# Patient Record
Sex: Female | Born: 1950
Health system: Southern US, Community
[De-identification: ages and names within clinical notes are randomized; demographics above are authoritative.]

## PROBLEM LIST (undated history)

## (undated) DIAGNOSIS — M8580 Other specified disorders of bone density and structure, unspecified site: Secondary | ICD-10-CM

## (undated) DIAGNOSIS — I10 Essential (primary) hypertension: Secondary | ICD-10-CM

## (undated) DIAGNOSIS — F32A Depression, unspecified: Secondary | ICD-10-CM

## (undated) DIAGNOSIS — I493 Ventricular premature depolarization: Secondary | ICD-10-CM

## (undated) DIAGNOSIS — F419 Anxiety disorder, unspecified: Secondary | ICD-10-CM

## (undated) DIAGNOSIS — R51 Headache: Secondary | ICD-10-CM

## (undated) DIAGNOSIS — M1990 Unspecified osteoarthritis, unspecified site: Secondary | ICD-10-CM

## (undated) DIAGNOSIS — E876 Hypokalemia: Secondary | ICD-10-CM

## (undated) DIAGNOSIS — I5022 Chronic systolic (congestive) heart failure: Secondary | ICD-10-CM

## (undated) DIAGNOSIS — C50919 Malignant neoplasm of unspecified site of unspecified female breast: Secondary | ICD-10-CM

## (undated) DIAGNOSIS — N183 Chronic kidney disease, stage 3 unspecified: Secondary | ICD-10-CM

## (undated) DIAGNOSIS — R9389 Abnormal findings on diagnostic imaging of other specified body structures: Secondary | ICD-10-CM

## (undated) DIAGNOSIS — B029 Zoster without complications: Secondary | ICD-10-CM

## (undated) DIAGNOSIS — I471 Supraventricular tachycardia: Secondary | ICD-10-CM

## (undated) DIAGNOSIS — I2510 Atherosclerotic heart disease of native coronary artery without angina pectoris: Secondary | ICD-10-CM

## (undated) DIAGNOSIS — E785 Hyperlipidemia, unspecified: Secondary | ICD-10-CM

## (undated) DIAGNOSIS — Z9119 Patient's noncompliance with other medical treatment and regimen: Secondary | ICD-10-CM

## (undated) DIAGNOSIS — C4490 Unspecified malignant neoplasm of skin, unspecified: Secondary | ICD-10-CM

## (undated) DIAGNOSIS — F329 Major depressive disorder, single episode, unspecified: Secondary | ICD-10-CM

## (undated) DIAGNOSIS — I4719 Other supraventricular tachycardia: Secondary | ICD-10-CM

## (undated) HISTORY — PX: CHOLECYSTECTOMY: SHX55

## (undated) HISTORY — DX: Patient's noncompliance with other medical treatment and regimen: Z91.19

## (undated) HISTORY — DX: Abnormal findings on diagnostic imaging of other specified body structures: R93.89

## (undated) HISTORY — DX: Chronic systolic (congestive) heart failure: I50.22

## (undated) HISTORY — DX: Chronic kidney disease, stage 3 (moderate): N18.3

## (undated) HISTORY — PX: ABDOMINAL HYSTERECTOMY: SHX81

## (undated) HISTORY — DX: Hyperlipidemia, unspecified: E78.5

## (undated) HISTORY — PX: PARTIAL MASTECTOMY WITH AXILLARY SENTINEL LYMPH NODE BIOPSY: SHX6004

## (undated) HISTORY — PX: BREAST BIOPSY: SHX20

## (undated) HISTORY — DX: Malignant neoplasm of unspecified site of unspecified female breast: C50.919

## (undated) HISTORY — DX: Other specified disorders of bone density and structure, unspecified site: M85.80

## (undated) HISTORY — PX: SENTINEL LYMPH NODE BIOPSY: SHX2392

## (undated) HISTORY — DX: Ventricular premature depolarization: I49.3

## (undated) HISTORY — DX: Chronic kidney disease, stage 3 unspecified: N18.30

## (undated) HISTORY — DX: Other supraventricular tachycardia: I47.19

## (undated) HISTORY — DX: Hypokalemia: E87.6

## (undated) HISTORY — DX: Supraventricular tachycardia: I47.1

## (undated) HISTORY — DX: Unspecified malignant neoplasm of skin, unspecified: C44.90

## (undated) SURGERY — EGD (ESOPHAGOGASTRODUODENOSCOPY)
Anesthesia: Moderate Sedation

---

## 2000-10-23 ENCOUNTER — Encounter: Admission: RE | Admit: 2000-10-23 | Discharge: 2000-10-23 | Payer: Self-pay

## 2001-07-22 ENCOUNTER — Observation Stay (HOSPITAL_COMMUNITY): Admission: EM | Admit: 2001-07-22 | Discharge: 2001-07-23 | Payer: Self-pay

## 2002-10-31 ENCOUNTER — Encounter: Payer: Self-pay | Admitting: Family Medicine

## 2002-10-31 ENCOUNTER — Ambulatory Visit (HOSPITAL_COMMUNITY): Admission: RE | Admit: 2002-10-31 | Discharge: 2002-10-31 | Payer: Self-pay | Admitting: Family Medicine

## 2003-12-22 ENCOUNTER — Ambulatory Visit (HOSPITAL_COMMUNITY): Admission: RE | Admit: 2003-12-22 | Discharge: 2003-12-22 | Payer: Self-pay | Admitting: Family Medicine

## 2004-03-22 ENCOUNTER — Ambulatory Visit (HOSPITAL_COMMUNITY): Admission: RE | Admit: 2004-03-22 | Discharge: 2004-03-22 | Payer: Self-pay | Admitting: Family Medicine

## 2004-06-03 ENCOUNTER — Ambulatory Visit (HOSPITAL_COMMUNITY): Admission: RE | Admit: 2004-06-03 | Discharge: 2004-06-03 | Payer: Self-pay | Admitting: Family Medicine

## 2005-02-19 ENCOUNTER — Ambulatory Visit (HOSPITAL_COMMUNITY): Admission: RE | Admit: 2005-02-19 | Discharge: 2005-02-19 | Payer: Self-pay | Admitting: Family Medicine

## 2005-03-12 ENCOUNTER — Encounter: Admission: RE | Admit: 2005-03-12 | Discharge: 2005-03-24 | Payer: Self-pay | Admitting: Neurosurgery

## 2006-09-18 ENCOUNTER — Ambulatory Visit (HOSPITAL_COMMUNITY): Admission: RE | Admit: 2006-09-18 | Discharge: 2006-09-18 | Payer: Self-pay | Admitting: Family Medicine

## 2007-06-03 ENCOUNTER — Emergency Department (HOSPITAL_COMMUNITY): Admission: EM | Admit: 2007-06-03 | Discharge: 2007-06-03 | Payer: Self-pay | Admitting: Emergency Medicine

## 2007-07-27 ENCOUNTER — Ambulatory Visit (HOSPITAL_COMMUNITY): Admission: RE | Admit: 2007-07-27 | Discharge: 2007-07-27 | Payer: Self-pay | Admitting: Family Medicine

## 2007-09-17 ENCOUNTER — Ambulatory Visit (HOSPITAL_COMMUNITY): Admission: RE | Admit: 2007-09-17 | Discharge: 2007-09-17 | Payer: Self-pay | Admitting: Family Medicine

## 2007-12-17 ENCOUNTER — Ambulatory Visit (HOSPITAL_COMMUNITY): Admission: RE | Admit: 2007-12-17 | Discharge: 2007-12-17 | Payer: Self-pay | Admitting: Family Medicine

## 2008-01-05 ENCOUNTER — Emergency Department (HOSPITAL_COMMUNITY): Admission: EM | Admit: 2008-01-05 | Discharge: 2008-01-05 | Payer: Self-pay | Admitting: Emergency Medicine

## 2008-07-27 ENCOUNTER — Emergency Department (HOSPITAL_COMMUNITY): Admission: EM | Admit: 2008-07-27 | Discharge: 2008-07-27 | Payer: Self-pay | Admitting: Family Medicine

## 2009-01-16 ENCOUNTER — Emergency Department (HOSPITAL_COMMUNITY): Admission: EM | Admit: 2009-01-16 | Discharge: 2009-01-16 | Payer: Self-pay | Admitting: Emergency Medicine

## 2009-05-15 ENCOUNTER — Ambulatory Visit (HOSPITAL_COMMUNITY): Admission: RE | Admit: 2009-05-15 | Discharge: 2009-05-15 | Payer: Self-pay | Admitting: Family Medicine

## 2009-08-13 ENCOUNTER — Ambulatory Visit (HOSPITAL_COMMUNITY): Admission: RE | Admit: 2009-08-13 | Discharge: 2009-08-13 | Payer: Self-pay | Admitting: Family Medicine

## 2010-01-21 ENCOUNTER — Ambulatory Visit (HOSPITAL_COMMUNITY): Admission: RE | Admit: 2010-01-21 | Discharge: 2010-01-21 | Payer: Self-pay | Admitting: Family Medicine

## 2010-02-27 ENCOUNTER — Emergency Department (HOSPITAL_COMMUNITY): Admission: EM | Admit: 2010-02-27 | Discharge: 2010-02-27 | Payer: Self-pay | Admitting: Emergency Medicine

## 2010-02-27 ENCOUNTER — Encounter: Payer: Self-pay | Admitting: Internal Medicine

## 2010-02-28 ENCOUNTER — Encounter: Payer: Self-pay | Admitting: Internal Medicine

## 2010-06-12 ENCOUNTER — Ambulatory Visit: Payer: Self-pay | Admitting: Cardiology

## 2010-06-12 ENCOUNTER — Emergency Department (HOSPITAL_COMMUNITY): Admission: EM | Admit: 2010-06-12 | Discharge: 2010-06-12 | Payer: Self-pay | Admitting: Family Medicine

## 2010-06-12 ENCOUNTER — Emergency Department (HOSPITAL_COMMUNITY): Admission: EM | Admit: 2010-06-12 | Discharge: 2010-06-13 | Payer: Self-pay | Admitting: Emergency Medicine

## 2010-06-14 ENCOUNTER — Telehealth: Payer: Self-pay | Admitting: Internal Medicine

## 2010-07-15 ENCOUNTER — Ambulatory Visit: Payer: Self-pay | Admitting: Internal Medicine

## 2010-07-15 ENCOUNTER — Telehealth (INDEPENDENT_AMBULATORY_CARE_PROVIDER_SITE_OTHER): Payer: Self-pay | Admitting: *Deleted

## 2010-07-15 DIAGNOSIS — Z8679 Personal history of other diseases of the circulatory system: Secondary | ICD-10-CM

## 2010-07-15 DIAGNOSIS — F341 Dysthymic disorder: Secondary | ICD-10-CM | POA: Insufficient documentation

## 2010-07-18 ENCOUNTER — Telehealth: Payer: Self-pay | Admitting: Internal Medicine

## 2010-07-19 ENCOUNTER — Telehealth: Payer: Self-pay | Admitting: Internal Medicine

## 2010-07-19 ENCOUNTER — Telehealth (INDEPENDENT_AMBULATORY_CARE_PROVIDER_SITE_OTHER): Payer: Self-pay | Admitting: *Deleted

## 2010-07-19 ENCOUNTER — Ambulatory Visit: Payer: Self-pay | Admitting: Internal Medicine

## 2010-07-19 ENCOUNTER — Observation Stay (HOSPITAL_COMMUNITY): Admission: EM | Admit: 2010-07-19 | Discharge: 2010-07-20 | Payer: Self-pay | Admitting: Emergency Medicine

## 2010-08-23 ENCOUNTER — Ambulatory Visit: Payer: Self-pay | Admitting: Internal Medicine

## 2010-08-23 DIAGNOSIS — R12 Heartburn: Secondary | ICD-10-CM

## 2010-12-16 ENCOUNTER — Encounter: Payer: Self-pay | Admitting: Family Medicine

## 2010-12-24 NOTE — Progress Notes (Signed)
  Records recieved form [facility] gave to [facility] for 08/23/10 appt w/ Francee Nodal Mesiemore  July 15, 2010 4:01 PM

## 2010-12-24 NOTE — Assessment & Plan Note (Signed)
Summary: 4wk f/u no avial slots in 4wk ok to wait 5weeks per chris y.   CC:  sob...and pt complains of stomach pains and gerd also pt has been under alot of stress due to husbands illness.  History of Present Illness: Kristen Garner is seen in followup for adenosine sensitive recurrent atrial tachycardia. Initial therapy attempts were undertaken with flecainide which was complicated by hypotension and bradycardia.  She was recently admitted for this; these records were reviewed. She was discharged on verapamil whichi has been assoc with gi distress.         She has had na echo in the remote past whtihc was normal and a myoview in [place] in April 2011 which was also normal    she has depression related to multiple circumstances including family work and her husband has recently had an mi and will be meeting with Dr Leonia Reeves  next week.   Current Medications (verified): 1)  Metoprolol Tartrate 50 Mg Tabs (Metoprolol Tartrate) .... Take One Tablet Two Times A Day 2)  Glimepiride 4 Mg Tabs (Glimepiride) .... Take One Tablet Once Daily 3)  Losartan Potassium 50 Mg Tabs (Losartan Potassium) .Marland Kitchen.. 1  Tab By Mouth Once Daily 4)  Zolpidem Tartrate 10 Mg Tabs (Zolpidem Tartrate) .... Take One Tablet Once At Bedtime 5)  Alprazolam 0.5 Mg Tabs (Alprazolam) .... Take One Tablet or 1/2 Tablet Every 6 Hrs 6)  Aspirin 81 Mg Tabs (Aspirin) .... Take One Tablet Once Daily 7)  Verapamil Hcl 120 Mg Tabs (Verapamil Hcl) .Marland Kitchen.. 1  Tab By Mouth Once Daily  Allergies: 1)  ! Pcn 2)  ! Sulfa 3)  ! Codeine 4)  ! 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 -1993 Alcohol Use - no  Vital Signs:  Patient profile:   60 year old Garner Height:      64 inches Weight:      203 pounds BMI:     34.97 Pulse rate:   81 / minute Resp:     14 per minute BP sitting:   159 / 81  (left arm)  Vitals Entered By: Burnett Kanaris (August 23, 2010 10:26 AM)  Physical Exam  General:  The patient was alert and oriented in no acute physical  but significant emotional distress. HEENT Normal.  Neck veins were flat, carotids were brisk.  Lungs were clear.  Heart sounds were regular without murmurs or gallops.  Abdomen was soft with active bowel sounds. There is no clubbing cyanosis or edema. Skin Warm and dry    Impression & Recommendations:  Problem # 1:  ATRIAL TACHYCARDIA (ICD-427.89) her rhythms are stable currently. The issue is the verapamil which appears to be associated with GI symptoms consistent with reflux. I'll have her hold the verapamil for a couple of weeks and she is to let us know how it is that she does both from a GI point of view as well as a rhythm point of view The following medications were removed from the medication list:    Flecainide Acetate 100 Mg Tabs (Flecainide acetate) .Marland Kitchen... 1 two times a day Her updated medication list  for this problem includes:    Metoprolol Tartrate 50 Mg Tabs (Metoprolol tartrate) .Marland Kitchen... Take one tablet two times a day    Aspirin 81 Mg Tabs (Aspirin) .Marland Kitchen... Take one tablet once daily    Verapamil Hcl 120 Mg Tabs (Verapamil hcl) .Marland Kitchen... 1  tab by mouth once daily  Problem # 2:  HEARTBURN (ICD-787.1) see above; it may also be associated with a significant stress. She is notably not taking a PPI.  Problem # 3:  ANXIETY DEPRESSION (ICD-300.4) I have encouraged her to follow up with her primary care physician to help develop strategies for dealing with the significant psychosocial stress under which she feels  herself burdened  Patient Instructions: 1)  Your physician wants you to follow-up in: 6 month  You will receive a reminder letter in the mail two months in advance. If you don't receive a letter, please call our office to schedule the follow-up appointment. 2)  Your physician recommends that you continue on your current medications as directed. Please refer to the Current Medication list given to you today.

## 2010-12-24 NOTE — Letter (Signed)
Summary: [place] By: Marilynne Drivers 09/27/2010 15:16:05  _____________________________________________________________________  External Attachment:    Type:   Image     Comment:   External Document

## 2010-12-24 NOTE — Progress Notes (Signed)
Summary: note to return to work  Phone Note Call from Patient Call back at [employer] [phone]   Caller: Patient Reason for Call: Talk to Nurse Summary of Call: per pt called, pt need a note saying it's o.k. for her to return to work. pt has appt to see dr. Caryl Comes on 8/22. saw jh @ [facility].  Initial call taken by: Neil Crouch,  June 14, 2010 9:08 AM  Follow-up for Phone Call        Spoke with pt. Patient states was seen in [facility] hospital for palpitations on July 20th/11 Cardiac consult done by Dr. Phineas Semen. Pt. would like to have a return to work note. Pt has not been seen in the office before.  Pt has an appointment with Dr. Caryl Comes on August 22nd at 10:15 AM. I let pt. know,  Dr. Caryl Comes  needs to see her before he  okay for her to go back to work. Pt. verbalized understanding. Follow-up by: Carollee Sires, RN, BSN,  June 14, 2010 11:54 AM

## 2010-12-24 NOTE — Progress Notes (Signed)
Summary: pt needs to discuss medication and low pulse  Phone Note Call from Patient Call back at [phone]   Caller: Patient Reason for Call: Talk to Nurse, Talk to Doctor Summary of Call: pt pulse is down to 41 and she wants to know what she needs to do about meds  Initial call taken by: Shelda Pal,  July 19, 2010 1:48 PM  Follow-up for Phone Call        Left message to call back. Theodosia Quay, RN, BSN  July 19, 2010 1:54 PM  I received a call from Bary Richard NP at [facility].  The pt presented to her clinic with SOB, DIZZINESS, and episodes of reflux.  The pt's BP is 158/66, pulse 44.  She would like to know what our office would like the pt to do in regards to her symptoms.  I instructed her that the pt should go to the ER for evaluation.  Shawn said that she could not advise them to go to the ER unless I spoke with Dr Caryl Comes first.  I spoke with Dr Caryl Comes and he would like the pt seen in the ER.  I called the pt's husband at [phone] and they have already [facility] to go to the ER. [facility] team aware the pt is coming.  Follow-up by: Theodosia Quay, RN, BSN,  July 19, 2010 4:57 PM

## 2010-12-24 NOTE — Progress Notes (Signed)
Summary: Question about medication  Phone Note Call from Patient Call back at Home Phone [phone]   Caller: Patient Summary of Call: Pt calling regarding medication (Flecainide Acetate 100mg ) Initial call taken by: Delsa Sale,  July 18, 2010 9:46 AM  Follow-up for Phone Call        lmtcb Devra Dopp, LPN  August 25, 624THL 10:50 AM  Additional Follow-up for Phone Call Additional follow up Details #1::        pt rtn your call Shelda Pal  July 18, 2010 4:05 PM   PER DR Caryl Comes  HAVE PT DECREASE FLECAINIDE TO 50 MG two times a day AND TO CALL IN 1 WEEK WITH UPDATE. ALSO ENCOURAGED PT TO KEEP B/P LOG  B/P TODAY 145/78 AND HR 74.PER PT Additional Follow-up by: Devra Dopp, LPN,  August 25, 624THL 5:39 PM

## 2010-12-24 NOTE — Assessment & Plan Note (Signed)
Summary: Nep/svt/pt was seen in  ed/jml   CC:  NEP/SVT.  pt was seen in ED.  History of Present Illness: Kristen Garner is seen at the request of Dr Percival Spanish and Dr Armandina Gemma bvecause os recurrent SVT going back about 8 years.  Thees episodes have become increasingly frequent now occurring once a week, lasting 30-120 minutes.  They are assoic w presycope at their onset, with Chest pressure, and sob.   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 Take One Tablet Two Times A Day 2)  Glimepiride 4 Mg Tabs (Glimepiride) .... Take One Tablet Once Daily 3)  Losartan Potassium 100 Mg Tabs (Losartan Potassium) .... Take One Tablet Once Daily 4)  Zolpidem Tartrate 10 Mg Tabs (Zolpidem Tartrate) .... Take One Tablet Once At Bedtime 5)  Alprazolam 0.5 Mg Tabs (Alprazolam) .... Take One Tablet or 1/2 Tablet Every 6 Hrs 6)  Aspirin 81 Mg Tabs (Aspirin) .... Take One Tablet Once Daily  Allergies (verified): 1)  ! Pcn 2)  ! Sulfa 3)  ! Codeine 4)  ! 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 -1993 Alcohol Use - no Smoking Status:  quit  Review of Systems       full review of systems was negative apart from a history of present illness and past medical history bronchihtis and glasses and partial dneutures   Vital Signs:  Patient profile:   60 year old female Height:      64 inches Weight:      203 pounds BMI:     34.97 Pulse rate:   93 / minute Pulse rhythm:   regular BP sitting:   134 / 86  (left arm) Cuff size:   large  Vitals Entered By: Doug Sou CMA (July 15, 2010 10:42 AM)  Physical Exam  General:  Well developed, well nourished elderly Caucasian female appearing somewhat older than her stated age of 30, in no acute distress. Head:  normal HEENT including partial dentures Neck:  supple without distended neck vein Chest Wall:  without kyphosis or school Lungs:  clear to Heart:  regular rate and rhythm obvious somewhat rapid without murmurs or gallops Abdomen:  soft nontender without hepatomegaly; obesity Msk:  Back normal, normal gait. Muscle strength and tone normal. Pulses:  intact distal pulses Extremities:  no clubbing cyanosis or edema Neurologic:  alert and oriented and grossly normal motor and sensory function Skin:  warm and dry Cervical  Nodes:  no adenopathy Psych:  depressed affect.  depressed affect.  depressed affect.     EKG  Procedure date:  07/15/2010  Findings:      sus rhythm at 93 Intervals 0.19/0.08/23 5 Axis is -22 Poor R-wave progression Voltage criteria for left ventricular hypertrophy with repolarization abnormalities  Impression & Recommendations:  Problem # 1:  ATRIAL TACHYCARDIA (ICD-427.89) The patient has recurrent atrial tachycardia. This is likely the mechanism; somewhat unusual but it isn't sensitive. P Wave  morphologies suggest a possible right atrial origin. We discussed treatment options including antiarrhythmic drug therapy versus catheter ablation. She would like to proceed with the former and will begin flecainide 100 mg twice daily and she's had a negative Myoview in the past.  we will review the situation in about 4 weeks time. She is written to not return to work for the interim Her updated medication list for this problem includes:    Metoprolol Tartrate 50 Mg Tabs (Metoprolol tartrate) .Marland Kitchen... Take one tablet two times a day    Aspirin 81 Mg Tabs (Aspirin) .Marland Kitchen... Take one tablet once daily    Flecainide Acetate 100 Mg Tabs (Flecainide acetate) .Marland Kitchen... 1 two times a day  Orders: EKG w/ Interpretation (93000)  Problem # 2:  PRESYNCOPE (ICD-780.4) related to the above with the onset of the arrhythmia  Problem # 3:  ANXIETY DEPRESSION (ICD-300.4) I have woken with her primary physician's office. Dr. Armandina Gemma is out of town. I suggested that L. not be a great drug for depression will defer that management to him  Patient Instructions: 1)  Your physician recommends that you schedule a follow-up appointment in: [place] 2)  Your physician has recommended you make the following change in your medication: FLECAINIDE 100 MG 1 two times a day  Prescriptions: FLECAINIDE ACETATE 100 MG TABS (FLECAINIDE ACETATE) 1 two times a day  #60 x 11   Entered by:   Devra Dopp, LPN   Authorized by:   Nikki Dom, MD, [facility]   Signed by:   Devra Dopp, LPN on [id number]   Method used:   Electronically to        [phone] (retail)       [place], [place]  [address]       Ph: [phone] or [phone]       Fax: [phone]   RxID:   [id number]

## 2010-12-24 NOTE — Progress Notes (Signed)
  UINUM request recieved sent to [facility]. Joelene Millin Mesiemore  July 19, 2010 11:15 AM\    Appended Document:  UNUM request recieved sent to [facility]   Appended Document:  Request received from [facility] sent to [facility]

## 2010-12-24 NOTE — Letter (Signed)
Summary: Nuclear Medicine Adenosine Stress Test  Nuclear Medicine Adenosine Stress Test   Imported By: Marilynne Drivers 09/27/2010 15:14:32  _____________________________________________________________________  External Attachment:    Type:   Image     Comment:   External Document

## 2011-02-06 LAB — URINALYSIS, ROUTINE W REFLEX MICROSCOPIC
Hgb urine dipstick: NEGATIVE
Ketones, ur: NEGATIVE mg/dL
Specific Gravity, Urine: 1.019 (ref 1.005–1.030)
pH: 5.5 (ref 5.0–8.0)

## 2011-02-06 LAB — TSH: TSH: 3.391 u[IU]/mL (ref 0.350–4.500)

## 2011-02-06 LAB — CBC
Hemoglobin: 13.5 g/dL (ref 12.0–15.0)
MCH: 30.6 pg (ref 26.0–34.0)
MCHC: 34.9 g/dL (ref 30.0–36.0)
RDW: 13.9 % (ref 11.5–15.5)

## 2011-02-06 LAB — DIFFERENTIAL
Basophils Absolute: 0.1 10*3/uL (ref 0.0–0.1)
Basophils Relative: 1 % (ref 0–1)
Eosinophils Absolute: 0.3 10*3/uL (ref 0.0–0.7)
Monocytes Relative: 7 % (ref 3–12)
Neutro Abs: 5.4 10*3/uL (ref 1.7–7.7)
Neutrophils Relative %: 67 % (ref 43–77)

## 2011-02-06 LAB — COMPREHENSIVE METABOLIC PANEL
ALT: 19 U/L (ref 0–35)
AST: 20 U/L (ref 0–37)
Albumin: 3.8 g/dL (ref 3.5–5.2)
Alkaline Phosphatase: 98 U/L (ref 39–117)
CO2: 24 mEq/L (ref 19–32)
Chloride: 98 mEq/L (ref 96–112)
GFR calc Af Amer: >60 mL/min (ref >60)
GFR calc non Af Amer: >60 mL/min (ref >60)
Potassium: 3.5 mEq/L (ref 3.5–5.1)
Sodium: 134 mEq/L — ABNORMAL LOW (ref 135–145)
Total Bilirubin: 0.5 mg/dL (ref 0.3–1.2)

## 2011-02-06 LAB — POCT I-STAT, CHEM 8
Calcium, Ion: 1.14 mmol/L (ref 1.12–1.32)
Chloride: 102 mEq/L (ref 96–112)
Glucose, Bld: 172 mg/dL — ABNORMAL HIGH (ref 70–99)
HCT: 39 % (ref 36.0–46.0)
Hemoglobin: 13.3 g/dL (ref 12.0–15.0)
Potassium: 3.5 mEq/L (ref 3.5–5.1)

## 2011-02-06 LAB — URINE MICROSCOPIC-ADD ON

## 2011-02-06 LAB — POCT CARDIAC MARKERS
CKMB, poc: <1 ng/mL — ABNORMAL LOW (ref 1.0–8.0)
Troponin i, poc: <0.05 ng/mL (ref 0.00–0.09)

## 2011-02-08 LAB — CBC
MCH: 30.9 pg (ref 26.0–34.0)
MCHC: 33.9 g/dL (ref 30.0–36.0)
MCV: 90.9 fL (ref 78.0–100.0)
Platelets: 207 10*3/uL (ref 150–400)
RDW: 14.2 % (ref 11.5–15.5)
WBC: 9.9 10*3/uL (ref 4.0–10.5)

## 2011-02-08 LAB — URINE CULTURE
Colony Count: NO GROWTH
Culture: NO GROWTH

## 2011-02-08 LAB — POCT CARDIAC MARKERS
CKMB, poc: 2.1 ng/mL (ref 1.0–8.0)
Myoglobin, poc: 126 ng/mL (ref 12–200)
Myoglobin, poc: 65.6 ng/mL (ref 12–200)
Myoglobin, poc: 72.4 ng/mL (ref 12–200)
Troponin i, poc: <0.05 ng/mL (ref 0.00–0.09)
Troponin i, poc: <0.05 ng/mL (ref 0.00–0.09)

## 2011-02-08 LAB — DIFFERENTIAL
Basophils Absolute: 0.1 10*3/uL (ref 0.0–0.1)
Basophils Relative: 1 % (ref 0–1)
Eosinophils Absolute: 0.2 10*3/uL (ref 0.0–0.7)
Eosinophils Relative: 2 % (ref 0–5)
Lymphocytes Relative: 15 % (ref 12–46)

## 2011-02-08 LAB — URINALYSIS, ROUTINE W REFLEX MICROSCOPIC
Bilirubin Urine: NEGATIVE
Ketones, ur: NEGATIVE mg/dL
Nitrite: NEGATIVE
Protein, ur: NEGATIVE mg/dL
Urobilinogen, UA: 0.2 mg/dL (ref 0.0–1.0)

## 2011-02-08 LAB — BASIC METABOLIC PANEL
BUN: 12 mg/dL (ref 6–23)
Chloride: 100 mEq/L (ref 96–112)
Creatinine, Ser: 0.95 mg/dL (ref 0.4–1.2)
Glucose, Bld: 182 mg/dL — ABNORMAL HIGH (ref 70–99)

## 2011-02-12 LAB — BASIC METABOLIC PANEL
BUN: 6 mg/dL (ref 6–23)
CO2: 31 mEq/L (ref 19–32)
Calcium: 9.6 mg/dL (ref 8.4–10.5)
Chloride: 98 mEq/L (ref 96–112)
Creatinine, Ser: 0.88 mg/dL (ref 0.4–1.2)
GFR calc Af Amer: >60 mL/min (ref >60)

## 2011-02-12 LAB — DIFFERENTIAL
Basophils Absolute: 0.1 10*3/uL (ref 0.0–0.1)
Basophils Relative: 1 % (ref 0–1)
Eosinophils Absolute: 0.2 10*3/uL (ref 0.0–0.7)
Monocytes Relative: 6 % (ref 3–12)
Neutro Abs: 6 10*3/uL (ref 1.7–7.7)
Neutrophils Relative %: 71 % (ref 43–77)

## 2011-02-12 LAB — CBC
MCHC: 35.2 g/dL (ref 30.0–36.0)
MCV: 88.4 fL (ref 78.0–100.0)
Platelets: 259 10*3/uL (ref 150–400)
RBC: 4.85 MIL/uL (ref 3.87–5.11)

## 2011-04-11 NOTE — Discharge Summary (Signed)
[place]  Patient:    Kristen Garner, Kristen Garner Visit Number: [id number] MRN: [id number]          Service Type: MED Location: [phone] Attending Physician:  Laverda Page Dictated by:   [facility] [place], [place]. Admit Date:  07/22/2001 Discharge Date: 07/23/2001   CC:         Dr. Gerarda Fraction; [facility]   Discharge Summary  ADMISSION DIAGNOSES:  1. Palpitations.  2. Hypertension.  3. Non-insulin-dependent diabetes mellitus.  4. History of 2-D echocardiogram July 31, 1999 that showed normal left     ventricular function, mild mitral regurgitation, trivial tricuspid     regurgitation, mild left atrial enlargement.  5. Status post exercise stress test February 17, 2000 which was normal.  6. History of bone spurs on the hips.  7. Chronic sinusitis.  8. History of miscarriage x 2.  9. History of migraines. 10. History of atrial fibrillation. 11. History of partial hysterectomy. 12. History of cholecystectomy.  DISCHARGE DIAGNOSES:  1. Palpitations.  2. Hypertension.  3. Non-insulin-dependent diabetes mellitus.  4. History of 2-D echocardiogram July 31, 1999 that showed normal left     ventricular function, mild mitral regurgitation, trivial tricuspid     regurgitation, mild left atrial enlargement.  5. Status post exercise stress test February 17, 2000 which was normal.  6. History of bone spurs on the hips.  7. Chronic sinusitis.  8. History of miscarriage x 2.  9. History of migraines. 10. History of atrial fibrillation. 11. History of partial hysterectomy. 12. History of cholecystectomy. 13. Status post negative cardiac enzymes, no myocardial infarction. 14. Status post no documented arrhythmia.  HISTORY OF PRESENT ILLNESS:  Kristen Garner is a 60 year old white female with a history of atrial fibrillation, hypertension, diabetes who developed "racing heartbeat" at approximately 2 p.m. while at work.  A nurse on site  recorded vital signs with a blood pressure of 160/84, heart rate 160s.  She had a finger stick blood sugar of 125.  She had complaints of "chest tightness" so the R.N. called EMS for transport to ER after evaluation.  She received four baby aspirin and sublingual nitroglycerin x 1 prior to her arrival.  Her chest tightness had resolved.  Chest tightness had lasted approximately 30 minutes, but had no associated diaphoresis, radiation, nausea, or vomiting.  It was rated 2/10 and was currently gone at time of interview in the ER.  Also of note the patient describes having episodes of "hot flashes" over the past month.  PHYSICAL EXAMINATION  VITAL SIGNS:  Blood pressure 151/81, heart rate 92, oxygen saturation 97% on room air.  LUNGS:  Clear.  HEART:  Irregular rhythm.  Examination was essentially benign with no significant findings.  LABORATORIES:  EKG showed normal sinus rhythm, 80 beats per minute with nonspecific ST-T change and no arrhythmia.  First set of cardiac enzymes were negative.  At that time she was seen and evaluated by Dr. Christen Butter.  She was planned for admission.  Check serial enzymes to rule out MI.  She will be placed on IV heparin as well as aspirin and Lopressor.  TSH and fasting lipid profile were ordered.  HOSPITAL COURSE:  On July 23, 2001 Kristen Garner cardiac enzymes were negative x 3.  EKG continues to be without change.  Thyroid function and lipid panel are pending.  Telemetry has revealed no significant arrhythmia.  At this point she has been asymptomatic since her admission.  She  is now without complaints and she is stable with temperature 97.4, pulse 70, blood pressure 132/74.  Examination remains essentially benign.  At this time office records have been obtained including her last echocardiogram and exercise stress test, both of which showed no significant abnormality.  Her enzymes are negative and telemetry showed no significant arrhythmia and  it is felt at this time that she can be discharged home and she is deemed stable for discharge home by Dr. Christen Butter.  CONSULTS:  None.  PROCEDURE:  None.  LABORATORIES:  Cardiac enzymes have been negative x 3.  First set showed CK 97, MB 3.7, troponin 0.07.  Followup set showed CK 65, MB 1.5, troponin 0.06. The other set was also negative, but has not printed out at this point.  On admission WBC 8.9, hemoglobin 14.4, hematocrit 41.5, platelets 245,000. Sodium 141, potassium 4.1, BUN 12, creatinine 1.0, glucose 127.  At this time TSH and fasting lipid profile results are pending.  EKG shows normal sinus rhythm, left axis deviation, left anterior fascicular block, no change compared to previous EKGs.  DISCHARGE MEDICATIONS: 1. Enteric coated aspirin 325 mg q.d. 2. Lescol 20 mg one q.h.s. 3. Ziac 10/6.25 q.d. 4. Glucotrol 5 mg q.d. 5. Celebrex 200 mg q.d.  DISCHARGE INSTRUCTIONS:  She is instructed to have no caffeine including coffee, sodas, and teas and to have limited chocolate intake.  Call the office at [phone] if she has further palpitations.  The office is to contact the patient when her event monitor is ready to pick up.  The office has been notified that she needs the event monitor for palpitations.  She has an appointment to follow-up with Dr. Gwenlyn Found in [place] office September 18 at 12:15 p.m. Dictated by:   [facility] [place], [place]. Attending Physician:  Laverda Page DD:  07/23/01 TD:  07/23/01 Job: [id number] [phone]

## 2011-08-15 LAB — DIFFERENTIAL
Basophils Relative: 1
Lymphocytes Relative: 17
Lymphs Abs: 2.6
Monocytes Absolute: 0.7
Monocytes Relative: 5
Neutro Abs: 11.2 — ABNORMAL HIGH
Neutrophils Relative %: 75

## 2011-08-15 LAB — I-STAT 8, (EC8 V) (CONVERTED LAB)
BUN: 7
Chloride: 102
Glucose, Bld: 297 — ABNORMAL HIGH
Hemoglobin: 15.3 — ABNORMAL HIGH
Potassium: 3.2 — ABNORMAL LOW
Sodium: 136
TCO2: 29
pH, Ven: 7.548 — ABNORMAL HIGH

## 2011-08-15 LAB — CBC
Hemoglobin: 14.8
RBC: 4.72
WBC: 15 — ABNORMAL HIGH

## 2011-08-15 LAB — POCT CARDIAC MARKERS
CKMB, poc: 1.6
CKMB, poc: <1 — ABNORMAL LOW
Myoglobin, poc: 108
Troponin i, poc: <0.05

## 2011-08-15 LAB — D-DIMER, QUANTITATIVE: D-Dimer, Quant: <0.22

## 2011-08-15 LAB — POCT I-STAT CREATININE: Operator id: 277751

## 2011-09-05 ENCOUNTER — Other Ambulatory Visit: Payer: Self-pay

## 2011-09-05 MED ORDER — METOPROLOL TARTRATE 50 MG PO TABS: 50.0000 mg | ORAL_TABLET | Freq: Two times a day (BID) | ORAL | Status: DC

## 2011-09-09 ENCOUNTER — Other Ambulatory Visit: Payer: Self-pay

## 2011-09-09 LAB — DIFFERENTIAL
Eosinophils Absolute: 0.4
Eosinophils Relative: 5
Lymphocytes Relative: 16
Lymphs Abs: 1.3
Monocytes Relative: 4

## 2011-09-09 LAB — RAPID URINE DRUG SCREEN, HOSP PERFORMED
Amphetamines: NOT DETECTED
Barbiturates: NOT DETECTED
Opiates: NOT DETECTED

## 2011-09-09 LAB — BASIC METABOLIC PANEL
BUN: 7
Chloride: 98
GFR calc Af Amer: >60
GFR calc non Af Amer: >60
Potassium: 3.6
Sodium: 137

## 2011-09-09 LAB — URINALYSIS, ROUTINE W REFLEX MICROSCOPIC
Bilirubin Urine: NEGATIVE
Glucose, UA: NEGATIVE
Hgb urine dipstick: NEGATIVE
Specific Gravity, Urine: 1.019
Urobilinogen, UA: 1
pH: 6

## 2011-09-09 LAB — ETHANOL: Alcohol, Ethyl (B): <5

## 2011-09-09 LAB — CBC
HCT: 40.2
Hemoglobin: 13.9
MCV: 87.1
Platelets: 255
RBC: 4.61
WBC: 8.5

## 2011-09-09 LAB — URINE MICROSCOPIC-ADD ON: WBC, UA: NONE SEEN

## 2011-09-09 MED ORDER — METOPROLOL TARTRATE 50 MG PO TABS: 50.0000 mg | ORAL_TABLET | Freq: Two times a day (BID) | ORAL | Status: DC

## 2011-09-11 ENCOUNTER — Other Ambulatory Visit: Payer: Self-pay

## 2011-09-11 MED ORDER — METOPROLOL TARTRATE 50 MG PO TABS: 50.0000 mg | ORAL_TABLET | Freq: Two times a day (BID) | ORAL | Status: DC

## 2011-09-11 NOTE — Telephone Encounter (Signed)
.   Requested Prescriptions   Pending Prescriptions Disp Refills  . metoprolol (LOPRESSOR) 50 MG tablet 90 tablet 1    Sig: Take 1 tablet (50 mg total) by mouth 2 (two) times daily.   E-scribe to [facility].

## 2012-03-24 ENCOUNTER — Other Ambulatory Visit (HOSPITAL_COMMUNITY): Payer: Self-pay | Admitting: Family Medicine

## 2012-03-24 ENCOUNTER — Ambulatory Visit (HOSPITAL_COMMUNITY)
Admission: RE | Admit: 2012-03-24 | Discharge: 2012-03-24 | Disposition: A | Discharge status: Home / Self Care | Payer: Medicaid Other | Source: Ambulatory Visit | Attending: Family Medicine | Admitting: Family Medicine

## 2012-03-24 DIAGNOSIS — N644 Mastodynia: Secondary | ICD-10-CM

## 2012-03-24 DIAGNOSIS — S2341XA Sprain of ribs, initial encounter: Secondary | ICD-10-CM

## 2012-03-24 DIAGNOSIS — R071 Chest pain on breathing: Secondary | ICD-10-CM | POA: Insufficient documentation

## 2012-04-07 ENCOUNTER — Ambulatory Visit (HOSPITAL_COMMUNITY)
Admission: RE | Admit: 2012-04-07 | Discharge: 2012-04-07 | Disposition: A | Discharge status: Home / Self Care | Payer: Medicaid Other | Source: Ambulatory Visit | Attending: Family Medicine | Admitting: Family Medicine

## 2012-04-07 ENCOUNTER — Other Ambulatory Visit (HOSPITAL_COMMUNITY): Payer: Self-pay | Admitting: Family Medicine

## 2012-04-07 DIAGNOSIS — Z1231 Encounter for screening mammogram for malignant neoplasm of breast: Secondary | ICD-10-CM | POA: Insufficient documentation

## 2012-04-07 DIAGNOSIS — N644 Mastodynia: Secondary | ICD-10-CM

## 2012-04-13 ENCOUNTER — Other Ambulatory Visit: Payer: Self-pay | Admitting: Family Medicine

## 2012-04-13 DIAGNOSIS — R928 Other abnormal and inconclusive findings on diagnostic imaging of breast: Secondary | ICD-10-CM

## 2012-04-21 ENCOUNTER — Other Ambulatory Visit: Payer: Self-pay | Admitting: Family Medicine

## 2012-04-21 ENCOUNTER — Ambulatory Visit (HOSPITAL_COMMUNITY)
Admission: RE | Admit: 2012-04-21 | Discharge: 2012-04-21 | Disposition: A | Discharge status: Home / Self Care | Payer: Medicaid Other | Source: Ambulatory Visit | Attending: Family Medicine | Admitting: Family Medicine

## 2012-04-21 ENCOUNTER — Other Ambulatory Visit (HOSPITAL_COMMUNITY): Payer: Self-pay | Admitting: Family Medicine

## 2012-04-21 DIAGNOSIS — R928 Other abnormal and inconclusive findings on diagnostic imaging of breast: Secondary | ICD-10-CM

## 2012-04-22 ENCOUNTER — Other Ambulatory Visit (HOSPITAL_COMMUNITY): Payer: Self-pay | Admitting: Nurse Practitioner

## 2012-04-22 DIAGNOSIS — N6310 Unspecified lump in the right breast, unspecified quadrant: Secondary | ICD-10-CM

## 2012-04-28 ENCOUNTER — Other Ambulatory Visit (HOSPITAL_COMMUNITY): Payer: Self-pay | Admitting: Nurse Practitioner

## 2012-04-28 ENCOUNTER — Ambulatory Visit (HOSPITAL_COMMUNITY)
Admission: RE | Admit: 2012-04-28 | Discharge: 2012-04-28 | Disposition: A | Discharge status: Home / Self Care | Payer: Medicaid Other | Source: Ambulatory Visit | Attending: Nurse Practitioner | Admitting: Nurse Practitioner

## 2012-04-28 DIAGNOSIS — N6310 Unspecified lump in the right breast, unspecified quadrant: Secondary | ICD-10-CM

## 2012-04-28 DIAGNOSIS — C50919 Malignant neoplasm of unspecified site of unspecified female breast: Secondary | ICD-10-CM | POA: Insufficient documentation

## 2012-04-28 NOTE — Discharge Instructions (Signed)
Breast Biopsy Care After These instructions give you information on caring for yourself after your procedure. Your doctor may also give you more specific instructions. Call your doctor if you have any problems or questions after your procedure. HOME CARE  Change any bandages (dressings) as told by your doctor.   Only take sponge baths during the first 2 to 3 days. Keep your bandage dry.   Wear a bra all day and night until you see your doctor again.   Eat a healthy diet.   Do not exercise, drive, lift, or do activities without your doctor's permission.   Only take medicine as told by your doctor. Do not take aspirin. Do not drink alcohol while taking pain medicine.   Protect the biopsy area. Do not let the area get bumped.   Keep all follow-up appointments.  Finding out the results of your test Ask when your test results will be ready. Make sure you get your test results. GET HELP RIGHT AWAY IF:   You have trouble breathing.   You have redness and puffiness (swelling) around the biopsy site.   You notice a bad smell coming from the biopsy site.   You are bleeding more from your biopsy site.   You have yellowish white fluid (pus) coming from the biopsy site.   The biopsy site is opening.   You get a rash.   You need stronger pain medicine.   You think you are reacting badly to your medicine.   You have a fever.  MAKE SURE YOU:  Understand these instructions.   Will watch your condition.   Will get help right away if you are not doing well or get worse.  Document Released: 09/06/2009 Document Revised: 10/30/2011 Document Reviewed: 09/06/2009 [facility] Patient Information 2012 [place].

## 2012-04-28 NOTE — Progress Notes (Signed)
MD placed 40mL 2% Lidocaine to right breast for core biopsy procedure.  See MD note for procedure details.  Pt tol well.  Steri strips and band aide placed to right breast post-procedure.

## 2012-04-30 ENCOUNTER — Other Ambulatory Visit: Payer: Self-pay | Admitting: Family Medicine

## 2012-04-30 DIAGNOSIS — C50911 Malignant neoplasm of unspecified site of right female breast: Secondary | ICD-10-CM

## 2012-05-05 ENCOUNTER — Ambulatory Visit (HOSPITAL_COMMUNITY)
Admission: RE | Admit: 2012-05-05 | Discharge: 2012-05-05 | Disposition: A | Discharge status: Home / Self Care | Payer: Medicaid Other | Source: Ambulatory Visit | Attending: Family Medicine | Admitting: Family Medicine

## 2012-05-05 DIAGNOSIS — C50911 Malignant neoplasm of unspecified site of right female breast: Secondary | ICD-10-CM

## 2012-05-05 DIAGNOSIS — C50919 Malignant neoplasm of unspecified site of unspecified female breast: Secondary | ICD-10-CM | POA: Insufficient documentation

## 2012-05-05 LAB — POCT I-STAT, CHEM 8
Calcium, Ion: 1.19 mmol/L (ref 1.12–1.32)
Chloride: 104 mEq/L (ref 96–112)
Creatinine, Ser: 0.9 mg/dL (ref 0.50–1.10)
Glucose, Bld: 126 mg/dL — ABNORMAL HIGH (ref 70–99)
HCT: 44 % (ref 36.0–46.0)
Hemoglobin: 15 g/dL (ref 12.0–15.0)

## 2012-05-05 MED ORDER — GADOBENATE DIMEGLUMINE 529 MG/ML IV SOLN
17.0000 mL | Freq: Once | INTRAVENOUS | Status: AC | PRN
  Administered 2012-05-05: 17 mL via INTRAVENOUS

## 2012-05-05 NOTE — Progress Notes (Signed)
Venipuncture performed in right dorsal hand for Creatnine level

## 2012-05-06 ENCOUNTER — Other Ambulatory Visit (HOSPITAL_COMMUNITY): Payer: Self-pay | Admitting: General Surgery

## 2012-05-06 DIAGNOSIS — C50911 Malignant neoplasm of unspecified site of right female breast: Secondary | ICD-10-CM

## 2012-05-06 DIAGNOSIS — C50919 Malignant neoplasm of unspecified site of unspecified female breast: Secondary | ICD-10-CM

## 2012-05-06 NOTE — Patient Instructions (Addendum)
19 Kristen Garner  05/06/2012   Your procedure is scheduled on:  05/12/12  Report to [facility] at 7:00 AM.  Call this number if you have problems the morning of surgery: [phone]   Remember:   Do not eat food:After Midnight.  May have [place] .    Take these medicines the morning of surgery with A SIP OF WATER: metoprolol  Do not wear jewelry, make-up or nail polish.  Do not wear lotions, powders, or perfumes. You may wear deodorant.  Do not shave 48 hours prior to surgery. Men may shave face and neck.  Do not bring valuables to the hospital.  Contacts, dentures or bridgework may not be worn into surgery.  Leave suitcase in the car. After surgery it may be brought to your room.  For patients admitted to the hospital, checkout time is 11:00 AM the day of discharge.   Patients discharged the day of surgery will not be allowed to drive home.  Name and phone number of your driver: family  Special Instructions: CHG Shower Use Special Wash: 1/2 bottle night before surgery and 1/2 bottle morning of surgery.   Please read over the following fact sheets that you were given: Pain Booklet, Coughing and Deep Breathing, MRSA Information, Surgical Site Infection Prevention, Anesthesia Post-op Instructions and Care and Recovery After Surgery   Mastectomy, With or Without Reconstruction Mastectomy (removal of the breast) is a procedure most commonly used to treat cancer (tumor) of the breast. Different procedures are available for treatment. This depends on the stage of the tumor (abnormal growths). Discuss this with your caregiver, surgeon (a specialist for performing operations such as this), or oncologist (someone specialized in the treatment of cancer). With proper information, you can decide which treatment is best for you. Although the sound of the word cancer is frightening to all of [place], the new treatments and medications can be a source of reassurance and comfort. If there are  things you are worried about, discuss them with your caregiver. He or she can help comfort you and your family. Some of the different procedures for treating breast cancer are:  Radical (extensive) mastectomy. This is an operation used to remove the entire breast, the muscles under the breast, and all of the glands (lymph nodes) under the arm. With all of the new treatments available for cancer of the breast, this procedure has become less common.   Modified radical mastectomy. This is a similar operation to the radical mastectomy described above. In the modified radical mastectomy, the muscles of the chest wall are not removed unless one of the lessor muscles is removed. One of the lessor muscles may be removed to allow better removal of the lymph nodes. The axillary lymph nodes are also removed. Rarely, during an axillary node dissection nerves to this area are damaged. Radiation therapy is then often used to the area following this surgery.   A total mastectomy also known as a complete or simple mastectomy. It involves removal of only the breast. The lymph nodes and the muscles are left in place.   In a lumpectomy, the lump is removed from the breast. This is the simplest form of surgical treatment. A sentinel lymph node biopsy may also be done. Additional treatment may be required.  RISKS AND COMPLICATIONS The main problems that follow removal of the breast include:  Infection (germs start growing in the wound). This can usually be treated with antibiotics (medications that kill germs).   Lymphedema. This  means the arm on the side of the breast that was operated on swells because the lymph (tissue fluid) cannot follow the main channels back into the body. This only occurs when the lymph nodes have had to be removed under the arm.   There may be some areas of numbness to the upper arm and around the incision (cut by the surgeon) in the breast. This happens because of the cutting of or damage to  some of the nerves in the area. This is most often unavoidable.   There may be difficultymoving the arm in a full range of motion (moving in all directions) following surgery. This usually improves with time following use and exercise.   Recurrence of breast cancer may happen with the very best of surgery and follow up treatment. Sometimes small cancer cells that cannot be seen with the naked eye have already spread at the time of surgery. When this happens other treatment is available. This treatment may be radiation, medications or a combination of both.  RECONSTRUCTION Reconstruction of the breast may be done immediately if there is not going to be post-operative radiation. This surgery is done for cosmetic (improve appearance) purposes to improve the physical appearance after the operation. This may be done in two ways:  It can be done using a saline filled prosthetic (an artificial breast which is filled with salt water). Silicone breast implants are now re-approved by the FDA and are being commonly used.   Reconstruction can be done using the body's own muscle/fat/skin.  Your caregiver will discuss your options with you. Depending upon your needs or choice, together you will be able to determine which procedure is best for you. Document Released: 08/05/2001 Document Revised: 10/30/2011 Document Reviewed: 03/28/2008 [facility] Patient Information 2012 [place].  PATIENT INSTRUCTIONS POST-ANESTHESIA  IMMEDIATELY FOLLOWING SURGERY:  Do not drive or operate machinery for the first twenty four hours after surgery.  Do not make any important decisions for twenty four hours after surgery or while taking narcotic pain medications or sedatives.  If you develop intractable nausea and vomiting or a severe headache please notify your doctor immediately.  FOLLOW-UP:  Please make an appointment with your surgeon as instructed. You do not need to follow up with anesthesia unless specifically  instructed to do so.  WOUND CARE INSTRUCTIONS (if applicable):  Keep a dry clean dressing on the anesthesia/puncture wound site if there is drainage.  Once the wound has quit draining you may leave it open to air.  Generally you should leave the bandage intact for twenty four hours unless there is drainage.  If the epidural site drains for more than 36-48 hours please call the anesthesia department.  QUESTIONS?:  Please feel free to call your physician or the hospital operator if you have any questions, and they will be happy to assist you.

## 2012-05-07 ENCOUNTER — Encounter (HOSPITAL_COMMUNITY)
Admission: RE | Admit: 2012-05-07 | Discharge: 2012-05-07 | Disposition: A | Discharge status: Home / Self Care | Payer: Medicaid Other | Source: Ambulatory Visit | Attending: General Surgery | Admitting: General Surgery

## 2012-05-07 ENCOUNTER — Other Ambulatory Visit: Payer: Self-pay

## 2012-05-07 ENCOUNTER — Encounter (HOSPITAL_COMMUNITY): Payer: Self-pay | Admitting: Pharmacy Technician

## 2012-05-07 ENCOUNTER — Encounter (HOSPITAL_COMMUNITY): Payer: Self-pay

## 2012-05-07 HISTORY — DX: Major depressive disorder, single episode, unspecified: F32.9

## 2012-05-07 HISTORY — DX: Headache: R51

## 2012-05-07 HISTORY — DX: Essential (primary) hypertension: I10

## 2012-05-07 HISTORY — DX: Anxiety disorder, unspecified: F41.9

## 2012-05-07 HISTORY — DX: Depression, unspecified: F32.A

## 2012-05-07 LAB — DIFFERENTIAL
Lymphs Abs: 1.4 10*3/uL (ref 0.7–4.0)
Monocytes Relative: 8 % (ref 3–12)
Neutro Abs: 5.2 10*3/uL (ref 1.7–7.7)
Neutrophils Relative %: 68 % (ref 43–77)

## 2012-05-07 LAB — BASIC METABOLIC PANEL
BUN: 14 mg/dL (ref 6–23)
CO2: 25 mEq/L (ref 19–32)
Calcium: 9.9 mg/dL (ref 8.4–10.5)
Creatinine, Ser: 0.79 mg/dL (ref 0.50–1.10)

## 2012-05-07 LAB — CBC
Hemoglobin: 14.7 g/dL (ref 12.0–15.0)
MCH: 29.6 pg (ref 26.0–34.0)
RBC: 4.97 MIL/uL (ref 3.87–5.11)

## 2012-05-07 LAB — SURGICAL PCR SCREEN: Staphylococcus aureus: NEGATIVE

## 2012-05-12 ENCOUNTER — Encounter (HOSPITAL_COMMUNITY): Payer: Self-pay | Admitting: *Deleted

## 2012-05-12 ENCOUNTER — Ambulatory Visit (HOSPITAL_COMMUNITY): Payer: Medicaid Other | Admitting: Anesthesiology

## 2012-05-12 ENCOUNTER — Inpatient Hospital Stay (HOSPITAL_COMMUNITY): Admission: RE | Admit: 2012-05-12 | Payer: Self-pay | Source: Ambulatory Visit

## 2012-05-12 ENCOUNTER — Encounter (HOSPITAL_COMMUNITY): Payer: Self-pay | Admitting: Anesthesiology

## 2012-05-12 ENCOUNTER — Emergency Department (HOSPITAL_COMMUNITY)
Admission: EM | Admit: 2012-05-12 | Discharge: 2012-05-12 | Disposition: A | Discharge status: Home / Self Care | Payer: Medicaid Other | Attending: Emergency Medicine | Admitting: Emergency Medicine

## 2012-05-12 ENCOUNTER — Ambulatory Visit (HOSPITAL_COMMUNITY)
Admission: RE | Admit: 2012-05-12 | Discharge: 2012-05-12 | Disposition: A | Discharge status: Home / Self Care | Payer: Medicaid Other | Source: Ambulatory Visit | Attending: General Surgery | Admitting: General Surgery

## 2012-05-12 ENCOUNTER — Encounter (HOSPITAL_COMMUNITY)
Admission: RE | Disposition: A | Discharge status: Home / Self Care | Payer: Self-pay | Source: Ambulatory Visit | Attending: General Surgery

## 2012-05-12 ENCOUNTER — Ambulatory Visit (HOSPITAL_COMMUNITY): Payer: Medicaid Other

## 2012-05-12 DIAGNOSIS — C50919 Malignant neoplasm of unspecified site of unspecified female breast: Secondary | ICD-10-CM

## 2012-05-12 DIAGNOSIS — F329 Major depressive disorder, single episode, unspecified: Secondary | ICD-10-CM | POA: Insufficient documentation

## 2012-05-12 DIAGNOSIS — Z01812 Encounter for preprocedural laboratory examination: Secondary | ICD-10-CM | POA: Insufficient documentation

## 2012-05-12 DIAGNOSIS — I10 Essential (primary) hypertension: Secondary | ICD-10-CM | POA: Insufficient documentation

## 2012-05-12 DIAGNOSIS — IMO0002 Reserved for concepts with insufficient information to code with codable children: Secondary | ICD-10-CM | POA: Insufficient documentation

## 2012-05-12 DIAGNOSIS — F411 Generalized anxiety disorder: Secondary | ICD-10-CM | POA: Insufficient documentation

## 2012-05-12 DIAGNOSIS — Z882 Allergy status to sulfonamides status: Secondary | ICD-10-CM | POA: Insufficient documentation

## 2012-05-12 DIAGNOSIS — Z880 Allergy status to penicillin: Secondary | ICD-10-CM | POA: Insufficient documentation

## 2012-05-12 DIAGNOSIS — Y830 Surgical operation with transplant of whole organ as the cause of abnormal reaction of the patient, or of later complication, without mention of misadventure at the time of the procedure: Secondary | ICD-10-CM | POA: Insufficient documentation

## 2012-05-12 DIAGNOSIS — C50911 Malignant neoplasm of unspecified site of right female breast: Secondary | ICD-10-CM

## 2012-05-12 DIAGNOSIS — E119 Type 2 diabetes mellitus without complications: Secondary | ICD-10-CM | POA: Insufficient documentation

## 2012-05-12 DIAGNOSIS — Z79899 Other long term (current) drug therapy: Secondary | ICD-10-CM | POA: Insufficient documentation

## 2012-05-12 DIAGNOSIS — Z87891 Personal history of nicotine dependence: Secondary | ICD-10-CM | POA: Insufficient documentation

## 2012-05-12 DIAGNOSIS — F3289 Other specified depressive episodes: Secondary | ICD-10-CM | POA: Insufficient documentation

## 2012-05-12 LAB — GLUCOSE, CAPILLARY: Glucose-Capillary: 169 mg/dL — ABNORMAL HIGH (ref 70–99)

## 2012-05-12 SURGERY — PARTIAL MASTECTOMY WITH NEEDLE LOCALIZATION AND AXILLARY SENTINEL LYMPH NODE BX
Anesthesia: General | Site: Breast | Laterality: Right | Wound class: Clean

## 2012-05-12 SURGERY — Surgical Case
Anesthesia: *Unknown

## 2012-05-12 MED ORDER — CLINDAMYCIN PHOSPHATE 600 MG/50ML IV SOLN
INTRAVENOUS | Status: AC
  Administered 2012-05-12: 600 mg via INTRAVENOUS
  Filled 2012-05-12: qty 50

## 2012-05-12 MED ORDER — PROPOFOL 10 MG/ML IV EMUL
INTRAVENOUS | Status: AC
  Filled 2012-05-12: qty 20

## 2012-05-12 MED ORDER — BUPIVACAINE HCL (PF) 0.5 % IJ SOLN
INTRAMUSCULAR | Status: DC | PRN
  Administered 2012-05-12: 10 mL

## 2012-05-12 MED ORDER — TECHNETIUM TC 99M SULFUR COLLOID FILTERED
0.5000 | Freq: Once | INTRAVENOUS | Status: AC | PRN
  Administered 2012-05-12: 0.53 via INTRADERMAL

## 2012-05-12 MED ORDER — ENOXAPARIN SODIUM 40 MG/0.4ML ~~LOC~~ SOLN
SUBCUTANEOUS | Status: AC
  Administered 2012-05-12: 40 mg via SUBCUTANEOUS
  Filled 2012-05-12: qty 0.4

## 2012-05-12 MED ORDER — ONDANSETRON HCL 4 MG/2ML IJ SOLN: 4.0000 mg | Freq: Once | INTRAMUSCULAR | Status: DC | PRN

## 2012-05-12 MED ORDER — CEFAZOLIN SODIUM-DEXTROSE 2-3 GM-% IV SOLR: 2.0000 g | INTRAVENOUS | Status: DC

## 2012-05-12 MED ORDER — FENTANYL CITRATE 0.05 MG/ML IJ SOLN
INTRAMUSCULAR | Status: DC | PRN
  Administered 2012-05-12 (×3): 50 ug via INTRAVENOUS

## 2012-05-12 MED ORDER — 0.9 % SODIUM CHLORIDE (POUR BTL) OPTIME
TOPICAL | Status: DC | PRN
  Administered 2012-05-12: 1000 mL

## 2012-05-12 MED ORDER — MIDAZOLAM HCL 2 MG/2ML IJ SOLN
INTRAMUSCULAR | Status: AC
  Administered 2012-05-12: 2 mg via INTRAVENOUS
  Filled 2012-05-12: qty 2

## 2012-05-12 MED ORDER — FENTANYL CITRATE 0.05 MG/ML IJ SOLN
INTRAMUSCULAR | Status: AC
  Filled 2012-05-12: qty 2

## 2012-05-12 MED ORDER — OXYCODONE-ACETAMINOPHEN 5-325 MG PO TABS
1.0000 | ORAL_TABLET | Freq: Once | ORAL | Status: AC
Start: 2012-05-12 — End: 2012-05-12
  Administered 2012-05-12: 1 via ORAL
  Filled 2012-05-12: qty 1

## 2012-05-12 MED ORDER — LIDOCAINE HCL (PF) 1 % IJ SOLN
INTRAMUSCULAR | Status: AC
  Filled 2012-05-12: qty 5

## 2012-05-12 MED ORDER — CLINDAMYCIN PHOSPHATE 600 MG/50ML IV SOLN: 600.0000 mg | Freq: Once | INTRAVENOUS | Status: DC

## 2012-05-12 MED ORDER — MIDAZOLAM HCL 2 MG/2ML IJ SOLN
1.0000 mg | INTRAMUSCULAR | Status: DC | PRN
  Administered 2012-05-12: 2 mg via INTRAVENOUS

## 2012-05-12 MED ORDER — HYDROCODONE-ACETAMINOPHEN 5-325 MG PO TABS: 1.0000 | ORAL_TABLET | ORAL | Status: AC | PRN

## 2012-05-12 MED ORDER — LIDOCAINE HCL 1 % IJ SOLN
INTRAMUSCULAR | Status: DC | PRN
  Administered 2012-05-12: 5 mg via INTRADERMAL
  Administered 2012-05-12: 50 mg via INTRADERMAL

## 2012-05-12 MED ORDER — PROPOFOL 10 MG/ML IV BOLUS
INTRAVENOUS | Status: DC | PRN
  Administered 2012-05-12: 150 mg via INTRAVENOUS

## 2012-05-12 MED ORDER — BUPIVACAINE HCL (PF) 0.5 % IJ SOLN
INTRAMUSCULAR | Status: AC
  Filled 2012-05-12: qty 30

## 2012-05-12 MED ORDER — ROCURONIUM BROMIDE 50 MG/5ML IV SOLN
INTRAVENOUS | Status: AC
  Filled 2012-05-12: qty 1

## 2012-05-12 MED ORDER — LACTATED RINGERS IV SOLN
INTRAVENOUS | Status: DC
  Administered 2012-05-12: 1000 mL via INTRAVENOUS

## 2012-05-12 MED ORDER — CEFAZOLIN SODIUM-DEXTROSE 2-3 GM-% IV SOLR
INTRAVENOUS | Status: AC
  Filled 2012-05-12: qty 50

## 2012-05-12 MED ORDER — FENTANYL CITRATE 0.05 MG/ML IJ SOLN
INTRAMUSCULAR | Status: AC
  Administered 2012-05-12: 50 ug via INTRAVENOUS
  Filled 2012-05-12: qty 5

## 2012-05-12 MED ORDER — ROCURONIUM BROMIDE 100 MG/10ML IV SOLN
INTRAVENOUS | Status: DC | PRN
  Administered 2012-05-12: 30 mg via INTRAVENOUS

## 2012-05-12 MED ORDER — FENTANYL CITRATE 0.05 MG/ML IJ SOLN
25.0000 ug | INTRAMUSCULAR | Status: DC | PRN
  Administered 2012-05-12: 50 ug via INTRAVENOUS

## 2012-05-12 MED ORDER — LACTATED RINGERS IV SOLN: INTRAVENOUS | Status: DC

## 2012-05-12 MED ORDER — TECHNETIUM TC 99M SULFUR COLLOID FILTERED
0.5000 | Freq: Once | INTRAVENOUS | Status: AC | PRN
  Administered 2012-05-12: 0.56 via INTRADERMAL

## 2012-05-12 MED ORDER — ENOXAPARIN SODIUM 40 MG/0.4ML ~~LOC~~ SOLN
40.0000 mg | Freq: Once | SUBCUTANEOUS | Status: AC
  Administered 2012-05-12: 40 mg via SUBCUTANEOUS

## 2012-05-12 SURGICAL SUPPLY — 35 items
APL SKNCLS STERI-STRIP NONHPOA (GAUZE/BANDAGES/DRESSINGS) ×1
BAG HAMPER (MISCELLANEOUS) ×2 IMPLANT
BENZOIN TINCTURE PRP APPL 2/3 (GAUZE/BANDAGES/DRESSINGS) ×2 IMPLANT
BNDG CMPR 82X61 PLY HI ABS (GAUZE/BANDAGES/DRESSINGS) ×1
BNDG CONFORM 6X.82 1P STRL (GAUZE/BANDAGES/DRESSINGS) ×2 IMPLANT
CLOTH BEACON ORANGE TIMEOUT ST (SAFETY) ×2 IMPLANT
COVER LIGHT HANDLE STERIS (MISCELLANEOUS) ×4 IMPLANT
COVER PROBE W GEL 5X96 (DRAPES) ×2 IMPLANT
DECANTER SPIKE VIAL GLASS SM (MISCELLANEOUS) ×2 IMPLANT
DURAPREP 26ML APPLICATOR (WOUND CARE) ×2 IMPLANT
ELECT REM PT RETURN 9FT ADLT (ELECTROSURGICAL) ×2
ELECTRODE REM PT RTRN 9FT ADLT (ELECTROSURGICAL) ×1 IMPLANT
GLOVE BIOGEL PI IND STRL 7.0 (GLOVE) IMPLANT
GLOVE BIOGEL PI IND STRL 7.5 (GLOVE) ×1 IMPLANT
GLOVE BIOGEL PI INDICATOR 7.0 (GLOVE) ×1
GLOVE BIOGEL PI INDICATOR 7.5 (GLOVE) ×1
GLOVE ECLIPSE 6.5 STRL STRAW (GLOVE) ×2 IMPLANT
GLOVE ECLIPSE 7.0 STRL STRAW (GLOVE) ×2 IMPLANT
GOWN STRL REIN XL XLG (GOWN DISPOSABLE) ×6 IMPLANT
KIT ROOM TURNOVER APOR (KITS) ×2 IMPLANT
MANIFOLD NEPTUNE II (INSTRUMENTS) ×2 IMPLANT
NDL HYPO 25X1 1.5 SAFETY (NEEDLE) ×1 IMPLANT
NEEDLE HYPO 25X1 1.5 SAFETY (NEEDLE) ×2 IMPLANT
NS IRRIG 1000ML POUR BTL (IV SOLUTION) ×2 IMPLANT
PACK MINOR (CUSTOM PROCEDURE TRAY) ×2 IMPLANT
PAD ARMBOARD 7.5X6 YLW CONV (MISCELLANEOUS) ×2 IMPLANT
SET BASIN LINEN APH (SET/KITS/TRAYS/PACK) ×2 IMPLANT
SPONGE GAUZE 2X2 8PLY STRL LF (GAUZE/BANDAGES/DRESSINGS) IMPLANT
STOCKINETTE IMPERVIOUS LG (DRAPES) ×2 IMPLANT
STRIP CLOSURE SKIN 1/2X4 (GAUZE/BANDAGES/DRESSINGS) ×4 IMPLANT
SUT MNCRL AB 4-0 PS2 18 (SUTURE) ×2 IMPLANT
SUT VIC AB 3-0 SH 27 (SUTURE) ×4
SUT VIC AB 3-0 SH 27X BRD (SUTURE) ×2 IMPLANT
SYR BULB IRRIGATION 50ML (SYRINGE) ×2 IMPLANT
SYR CONTROL 10ML LL (SYRINGE) ×2 IMPLANT

## 2012-05-12 NOTE — Anesthesia Postprocedure Evaluation (Signed)
  Anesthesia Post-op Note  Patient: Kristen Garner  Procedure(s) Performed: Procedure(s) (LRB): PARTIAL MASTECTOMY WITH NEEDLE LOCALIZATION AND AXILLARY SENTINEL LYMPH NODE BX (Right)  Patient Location: PACU  Anesthesia Type: General  Level of Consciousness: awake, alert  and oriented  Airway and Oxygen Therapy: Patient Spontanous Breathing and Patient connected to face mask oxygen  Post-op Pain: none  Post-op Assessment: Post-op Vital signs reviewed, Patient's Cardiovascular Status Stable, Respiratory Function Stable, Patent Airway and No signs of Nausea or vomiting  Post-op Vital Signs: Reviewed and stable  Complications: No apparent anesthesia complications

## 2012-05-12 NOTE — ED Provider Notes (Signed)
History   This chart was scribed for Janice Norrie, MD by Marin Comment . The patient was seen in room APA12/APA12.    CSN: [id number]  Arrival date & time 05/12/12  1647   First MD Initiated Contact with Patient 05/12/12 1713      Chief Complaint  Patient presents with  . Wound Check    (Consider location/radiation/quality/duration/timing/severity/associated sxs/prior treatment) HPI Kristen Garner is a 61 y.o. female who presents to the Emergency Department complaining of constant, moderate bleeding from her right breast incision starting earlier today. Patient states that she had breast cancer and was operating on today at 0700. Lumpectomy preformed on right breast by Dr. Geroge Baseman with some lymph node resections. Patient states that she started bleeding 30 minutes after she got home today. She states it soaked through her clothes and dressings. Patient reports associated pain at the incision site.  Patient states that she has not had any pain medication yet. Patient states that she tried to  contact Dr. Geroge Baseman but was unable to get in touch. Patient states that she was told to come to ED if she started bleeding.   PCP: [name] Surgery: Dr. Geroge Baseman   Past Medical History  Diagnosis Date  . Hypertension   . Irregular heart beat   . Anxiety   . Depression   . Diabetes mellitus     takes only the pill  . Headache     occiasional migraine    Past Surgical History  Procedure Date  . Abdominal hysterectomy   . Cholecystectomy   . Breast surgery     History reviewed. No pertinent family history.  History  Substance Use Topics  . Smoking status: Former Research scientist (life sciences)  . Smokeless tobacco: Not on file  . Alcohol Use: No  widowed Unemployed Lives at home  OB History    Grav Para Term Preterm Abortions TAB SAB Ect Mult Living                  Review of Systems  Skin: Positive for wound.  All other systems reviewed and are negative.    Allergies    Codeine; Penicillins; Sulfonamide derivatives; and Iodine  Home Medications   Current Outpatient Rx  Name Route Sig Dispense Refill  . ALBUTEROL SULFATE HFA 108 (90 BASE) MCG/ACT IN AERS Inhalation Inhale 2 puffs into the lungs every 6 (six) hours as needed.    Marland Kitchen HYDROCODONE-ACETAMINOPHEN 5-325 MG PO TABS Oral Take 1-2 tablets by mouth every 4 (four) hours as needed for pain. 45 tablet 0  . IBUPROFEN 200 MG PO TABS Oral Take 400-600 mg by mouth every 6 (six) hours as needed. In alternation with Hydrocodone/APAP    . METFORMIN HCL 500 MG PO TABS Oral Take 500 mg by mouth 2 (two) times daily.    Marland Kitchen METOPROLOL TARTRATE 50 MG PO TABS Oral Take 1 tablet (50 mg total) by mouth 2 (two) times daily. 90 tablet 1    Pt need to call office for appointment for future  ...  . TRAMADOL HCL 50 MG PO TABS Oral Take 50 mg by mouth 4 (four) times daily. For 10 days started 04/25/12    . ZOLPIDEM TARTRATE 10 MG PO TABS Oral Take 10 mg by mouth at bedtime.      BP 141/57  Pulse 86  Temp 98.4 F (36.9 C) (Oral)  Resp 20  Ht 5\' 4"  (1.626 m)  Wt 185 lb (83.915 kg)  BMI 31.76 kg/m2  SpO2 98%  Vital signs normal    Physical Exam  Nursing note and vitals reviewed. Constitutional: She is oriented to person, place, and time. She appears well-developed and well-nourished. No distress.  HENT:  Head: Normocephalic and atraumatic.  Right Ear: External ear normal.  Left Ear: External ear normal.  Eyes: Conjunctivae and EOM are normal. Pupils are equal, round, and reactive to light.  Neck: Normal range of motion. Neck supple. No tracheal deviation present.  Pulmonary/Chest: Effort normal. No respiratory distress.  Musculoskeletal: Normal range of motion. She exhibits no edema.  Neurological: She is alert and oriented to person, place, and time. No sensory deficit.  Skin: Skin is warm and dry.       Semi-circular incision surrounding the right nipple from  7-o'clock to 12 o'clock. Hidden subcuticular sutures.  Area at 9 o'clock that is more bruised and firm to touch. No active bleeding seen. Her steristrips were removed (most were loose).   Psychiatric: She has a normal mood and affect. Her behavior is normal.    ED Course  Procedures (including critical care time)  DIAGNOSTIC STUDIES: Oxygen Saturation is 98% on room air, normal by my interpretation.    COORDINATION OF CARE:  [id number]: Discussed planned course of treatment with the patient, who is agreeable at this time.   19:41 Dr Arnoldo Morale here in ED and will see patient.  Dr Arnoldo Morale put dermabond on the surgical incision.    1. Post-op bleeding     Plan discharge  Rolland Porter, MD, FACEP   MDM    I personally performed the services described in this documentation, which was scribed in my presence. The recorded information has been reviewed and considered. Rolland Porter, MD, Abram Sander        Janice Norrie, MD 05/12/12 2020

## 2012-05-12 NOTE — Discharge Instructions (Signed)
Segmental Mastectomy and Axillary Lymph Node Dissection Care After These discharge instructions provide you with general information on caring for yourself after you leave the hospital. While your treatment has been planned according to the most current medical practices available, unavoidable complications occasionally occur. It is important that you know the warning signs of complications so that you can seek treatment. Please read the instructions outlined below and refer to this sheet in the next few weeks. Your doctor may also give you specific information. If you have any complications or questions after discharge, please call your doctor.  ACTIVITY  Your doctor will tell you when you may resume strenuous activities, driving and sports. After the drain(s) are removed, you may do light housework, but avoid heavy lifting, carrying or pushing (you should not be lifting anything heavier than 5 lbs.).   Take frequent rest periods. You may tire more easily than usual. Always rest and elevate the arm affected by your surgery for a period of time equal to your activity time.   Continue doing the exercises given to you by the physical therapist/occupational therapist even after full range of motion has returned. The amount of time this takes will vary from person to person. Generally, after normal range of motion has returned, some stiffness and soreness may persist for 2-3 months. This is normal and should subside.   Begin sports or strenuous activities in moderation, giving you a chance to rebuild your endurance.   Continue to be cautious of heavy lifting or carrying (not more than 10 lbs.) with the affected arm. You may return to work as recommended by your doctor.  NUTRITION  You may resume your normal diet.   Make sure you drink plenty of fluids (6-8 glasses per day).   Eat a well-balanced diet.   Daily portions of food from the grains, vegetables, fruits, milk, and meat and beans families  are necessary for your health. You may visit [employer] for more dietary information.  HYGIENE  You may wash your hair.   If your incision (the cut from surgery) is healed, you may shower or take a bath, unless instructed otherwise by your caregiver.  FEVER If you feel feverish or have shaking chills, take your temperature. If your temperature is 102 F (38.9 C) or above, call your caregiver. The fever may mean there is an infection. If you call early, infection can be treated with antibiotics and hospitalization may be avoided. PAIN CONTROL  Mild discomfort may occur. You may need to take an "over-the-counter" pain medication or a medication prescribed by your doctor.   Call your doctor if you experience increased pain.  INCISION CARE Check your incision daily for increased redness, drainage, swelling or separation of skin edges. Call your doctor if any of the above are noted. DRAIN CARE If you have a drain in place, ask for instructions for "Surgical Drain Care". ARM AND HAND CARE  Since the lymph nodes under your arm have been removed, there may be a greater chance for the arm to swell.   Avoid, if possible, having blood pressures taken, blood drawn or injections given in the affected arm.   Use hand lotion to soften fingernail cuticles instead of cutting them to avoid cutting yourself.   Use an electric shaver to shave your underarms.   You may use a deodorant after the incision has completely healed.   Be careful not to cut yourself when cooking, sewing or gardening.   Do not weigh your arm straight  down with a package or your purse.  Note: Follow the exercises and instructions given to you by the physical therapist/occupational therapist and your treating doctor.  Document Released: 06/24/2004 Document Revised: 10/30/2011 Document Reviewed: 02/01/2008 [facility] Patient Information 2012 [place].

## 2012-05-12 NOTE — Anesthesia Preprocedure Evaluation (Addendum)
Anesthesia Evaluation  Patient identified by MRN, date of birth, ID band Patient awake    Reviewed: Allergy & Precautions, H&P , NPO status , Patient's Chart, lab work & pertinent test results, reviewed documented beta blocker date and time   History of Anesthesia Complications Negative for: history of anesthetic complications  Airway Mallampati: III      Dental  (+) Edentulous Upper   Pulmonary neg pulmonary ROS,  breath sounds clear to auscultation        Cardiovascular hypertension, Pt. on medications + dysrhythmias Rhythm:Regular Rate:Normal     Neuro/Psych  Headaches, PSYCHIATRIC DISORDERS Anxiety Depression    GI/Hepatic   Endo/Other  Diabetes mellitus-, Well Controlled, Type 2, Oral Hypoglycemic Agents  Renal/GU      Musculoskeletal   Abdominal   Peds  Hematology   Anesthesia Other Findings   Reproductive/Obstetrics                           Anesthesia Physical Anesthesia Plan  ASA: III  Anesthesia Plan: General   Post-op Pain Management:    Induction: Intravenous  Airway Management Planned: Oral ETT  Additional Equipment:   Intra-op Plan:   Post-operative Plan: Extubation in OR  Informed Consent: I have reviewed the patients History and Physical, chart, labs and discussed the procedure including the risks, benefits and alternatives for the proposed anesthesia with the patient or authorized representative who has indicated his/her understanding and acceptance.     Plan Discussed with:   Anesthesia Plan Comments:        Anesthesia Quick Evaluation

## 2012-05-12 NOTE — Transfer of Care (Signed)
Immediate Anesthesia Transfer of Care Note  Patient: Kristen Garner  Procedure(s) Performed: Procedure(s) (LRB): PARTIAL MASTECTOMY WITH NEEDLE LOCALIZATION AND AXILLARY SENTINEL LYMPH NODE BX (Right)  Patient Location: PACU  Anesthesia Type: General  Level of Consciousness: awake, alert  and oriented  Airway & Oxygen Therapy: Patient Spontanous Breathing and Patient connected to face mask oxygen  Post-op Assessment: Report given to PACU RN  Post vital signs: Reviewed and stable  Complications: No apparent anesthesia complications

## 2012-05-12 NOTE — ED Notes (Signed)
Incision to Rt breast cleaned and covered loosely with sterile 4x4's.

## 2012-05-12 NOTE — H&P (Signed)
NTS SOAP Note  Vital Signs:  Vitals as of: [id number]: Systolic A999333: Diastolic 90: Heart Rate 71: Temp 97.91F: Height 42ft 4in: Weight 185Lbs 0 Ounces: BMI 32  BMI : 31.75 kg/m2  Subjective: This 67 Years 40 Months old Female presents for of initially had left-sided breast pain.  patient presented to the health department with her left-sided breast pain. On evaluation she was actually noted to have a right breast mass. Patient denies ever feeling this on self-examinations. She was scheduled for mammography and ultrasound and a lesion was found to be suspicious. She was set up for stereotactic biopsy and the returning information did present as a ductal carcinoma possibly in situ. She was referred to our office for further evaluation. She denies any nipple discharge. No bleeding. She has no skin changes. No history of fevers or chills. No significant family history of breast cancer over a ovarian or uterine cancers. Patient did have some ecchymosis in this area following the biopsy but no other changes.  Review of Symptoms:  Constitutional:unremarkable   Head:unremarkable    Eyes:unremarkable   t:rhinorrhea Cardiovascular:  unremarkable   Respiratory:unremarkable       GERD Genitourinary:unremarkable     Musculoskeletal:unremarkable   Skin:unremarkable   as per history of present illness Hematolgic/Lymphatic:unremarkable     Allergic/Immunologic:unremarkable     Past Medical History:  Obtained     Past Medical History  Pregnancy Gravida: 0 Pregnancy Para: 0 Surgical History:none Medical Problems: hypertension, GERD, diabetes mellitus Psychiatric History: none Allergies: no known drug allergies. Medications: zolpidem, metoprolol, omeprazole, glipizide   Social History:Obtained  Social History  Preferred Language: English ([place]) Race:  White Ethnicity: Not Hispanic / Latino Age: 26 Years 5 Months Marital Status:   W Alcohol: none Recreational drug(s): none   Smoking Status: Never smoker reviewed on 05/10/2012  Family History:Obtained     Family History  Is there a family history [id number] mellitus and coronary artery disease    Objective Information: General:  Well appearing, well nourished in no distress. Skin:     no rash or prominent lesions Head:Atraumatic; no masses; no abnormalities Eyes:  conjunctiva clear, EOM intact, PERRL Mouth:  Mucous membranes moist, no mucosal lesions. Neck:  Supple without lymphadenopathy.  Heart:  RRR, no murmur Lungs:    CTA bilaterally, no wheezes, rhonchi, rales.  Breathing unlabored.     somewhat tender on the right side at the approximate 10:00 position. No lymphadenopathy is noted Abdomen:Soft, NT/ND, no HSM, no masses. Extremities:  No deformities, clubbing, cyanosis, or edema.    path:  ductal carcinoma in situ possible. Unfortunately limited due to the fact that this a core needle biopsy.   Assessment:  Diagnosis &amp; Procedure: DiagnosisCode: 611.72, ProcedureCode: [id number],    Plan: ductal carcinoma, breast mass. At this point had a long discussion and conversation with the patient regarding the findings. While this may represent a ductal carcinoma in situ based on the pathology only formal dissection we'll provide the appropriate information.at this time I did discuss with the patient both simple mastectomy as well as partial mastectomy. I do feel partial mastectomy with a sentinel lymph nodebiopsy is recommended. Patient's questions and concerns are addressed. Patient will be scheduling her convenience in the next 1-2 weeks.  Patient Education:Alternative treatments to surgery were discussed with patient (and family).  Risks and benefits  of procedure were fully explained to the patient (and family) who gave informed consent. Patient/family questions were addressed.  Follow-up:Pending  Surgery  Post Operative Follow Up - 05/11/2012  Patient Name: Kristen Garner Date of Birth: 11/21/1951  Vital Signs:  Insert Vitals as of: A999333: Systolic A999333: Diastolic 81: Heart Rate 74: Temp 36.78C: Height 163CM: Weight 83.91KG: BMI 32   BMI: 31.75 kg/m2  Subjective: This 74 Years 44 Months old Female presents for followup. Patient   has no  significant complaints.Patient had questions regarding surgery.  Inquired about bilateral mastectomy.  Inquired about surgery, perioperative, and post-operative managment.   Social History: Reviewed  Social History  Preferred Language: English ([place]) Race:  White Ethnicity: Not Hispanic / Latino Age: 63 Years 5 Months Marital Status:  W Alcohol: none Recreational drug(s): none  FAMILY History:  Reviewed.  Smoking Status: Never smoker    Allergies:  Allergies Insert Code: No allergies found.     Objective: General: Well appearing, well nourished in no distress.    Assessment:   Diagnosis &amp; Procedure: DiagnosisCode: 611.72, ProcedureCode: [id number],    PO [id number]  Plan:  Breast Cancer.  Long discussion with patient regarding her questions.  No indication for bilateral.  Discussed this as option but not needed.  Reassured patient.  Will proceed tomorrow with needle localized partial mastectomy with SLN bx.  Follow-up:Pending Surgery

## 2012-05-12 NOTE — ED Notes (Signed)
Dr Arnoldo Morale at bedside. Stated "I applied dermabond to patient and she is ready to be discharged." No bleeding noted at surgical site at this time.

## 2012-05-12 NOTE — ED Notes (Signed)
Patient sitting in chair in room with family at bedside. No bleeding noted from surgical site on right breast. Patient states "I would like to go home if I can." Advised patient I would speak to the doctor.

## 2012-05-12 NOTE — Discharge Instructions (Signed)
Keep soap off the dermabond (skin glue) as it will make the glue dissolve quicker. Keep your appointments with Dr Benn Moulder.  Follow your post-op instruction. Recheck as needed.

## 2012-05-12 NOTE — Progress Notes (Signed)
To mammo for xray

## 2012-05-12 NOTE — ED Notes (Signed)
Rt breast lumpectomy done this morning here, Returned due to bleeding.

## 2012-05-12 NOTE — Anesthesia Procedure Notes (Signed)
Procedure Name: Intubation Date/Time: 05/12/2012 10:36 AM Performed by: Tressie Stalker E Pre-anesthesia Checklist: Patient identified, Patient being monitored, Timeout performed, Emergency Drugs available and Suction available Patient Re-evaluated:Patient Re-evaluated prior to inductionOxygen Delivery Method: Circle System Utilized Preoxygenation: Pre-oxygenation with 100% oxygen Intubation Type: IV induction Ventilation: Mask ventilation without difficulty Laryngoscope Size: Mac and 3 Grade View: Grade I Tube type: Oral Tube size: 7.0 mm Number of attempts: 1 Airway Equipment and Method: stylet Placement Confirmation: ETT inserted through vocal cords under direct vision,  positive ETCO2 and breath sounds checked- equal and bilateral Secured at: 21 cm Tube secured with: Tape Dental Injury: Teeth and Oropharynx as per pre-operative assessment

## 2012-05-12 NOTE — Progress Notes (Signed)
After changing clothes, patient started bleeding at the incision site at the nipple line.  Reinforced with steri strips and 4x4 gauze. Dr.  Geroge Baseman notified.  Bleeding stopped after approx 10 minutes.  Pt discharged with instructions to notify Dr. Geroge Baseman or [facility] if bleeding starts again. Pt was sent home with stable vital signs and no longer bleeding at the incision site. Pt and family  Verbalized understanding.

## 2012-05-12 NOTE — Interval H&P Note (Signed)
History and Physical Interval Note:  05/12/2012 10:18 AM  Kristen Garner  has presented today for surgery, with the diagnosis of right breast cancer  The various methods of treatment have been discussed with the patient and family. After consideration of risks, benefits and other options for treatment, the patient has consented to  Procedure(s) (LRB): PARTIAL MASTECTOMY WITH NEEDLE LOCALIZATION AND AXILLARY SENTINEL LYMPH NODE BX (Right) as a surgical intervention .  The patient's history has been reviewed, patient examined, no change in status, stable for surgery.  I have reviewed the patients' chart and labs.  Questions were answered to the patient's satisfaction.     [name] C

## 2012-05-12 NOTE — Op Note (Signed)
Patient:  Kristen Garner  DOB:  20-Jan-1951  MRN:  [id number]   Preop Diagnosis:   Ductal carcinoma of the right breast  Postop Diagnosis:  The same  Procedure:  Right partial mastectomy with needle localization and sentinel lymph node biopsy.  Surgeon:  Dr. Chelsea Primus  Anes:  General endotracheal, 0.5% Sensorcaine plain for local  Indications:  Patient is a 61 year old female who is referred to my office from the [place] after a right sided breast biopsy demonstrated ductal carcinoma possibly in situ. Based on the biopsy was impossible to determine and surgical options were discussed. Risks benefits alternatives of a partial mastectomy were discussed at length the patient. As neither the patient nor myself could adequately feel the lump on that side I recommend proceeding with a needle localization. Her questions and concerns were addressed the patient was consented for the planned procedure  Procedure note:  Patient was initially taken to the radiology department at which point a wire was inserted for needle localization as well as the radio tracer was injected for the sentinel node biopsy. After adequate time elapsed patient was taken to the operating room and was placed in supine position the operator table. At this point the general anesthetic is a Company secretary. Once patient was asleep she was endotracheally intubated by the nurse anesthetist. At this point her right chest wall neck and arm are prepped with chlorhexidine solution and draped in standard fashion. The probe was utilized to identify the extracorporal elevation of the tracer. Initial readings were 250-300. A skin incision with a 10 blade scalpel was created in this point. Additional dissection down to through the subcuticular tissues carried out using electrocautery and an entrance into the axillary fat pad.  The probe was utilized to guide my dissection. At this point dissection was carried out using blunt  hemostat dissection. The readings were between 1400-1800 at this point.  A lymph node was identified and intracorporeal readings were 2200. This was carefully dissected free. Extracorporal readings were 2800. The residual pocket had readings of less than 150. The lymph node was sent to pathology for fresh frozen section at this time.  A Ray-Tec sponge was placed into the axillary incision. At this time I turned my attention to the breast dissection. A periareolar incision was created with a 15 blade scalpel. Additional dissection down to subcuticular tissues carried out using electrocautery. I carefully freed the wire and continue to follow the wire with approximate 2 cm circumferential dissection around the wire. This is carried deep to the chest wall. Once completely circumferentially dissected the specimen was marked with a 3-0 Vicryl with a short suture superiorly and a long suture laterally. The wire was obviously superficial. The specimen was sent to radiology for evaluation and for confirmation that the previously placed biopsy clip and wire were all present.  Hemostasis was obtained at this time using electrocautery. Both wounds were irrigated with copious sterile saline. I opted while waiting for results 2 start the initial closing. A 3-0 Vicryl was utilized reapproximate the deep subcuticular suture at both incisions. During this period radiology called and did confirm that the entire specimen was present. I therefore continued to close the breast incision. A 4-0 Monocryl was utilized to reapproximate the skin edges in a running subcuticular suture. The skin was washed dried moist dry towel. Pathology did eventually call regarding the sentinel lymph node. Initial reports are consistent with no evidence of malignancy. I continued to close at this time with  a 4-0 Monocryl to reapproximate the skin edges at the axillary incision. The local anesthetic was instilled. Benzoin is applied around incision after  both areas were washed dried moist dry towel. Half-inch are suture placed. The drapes removed the patient was allowed to come out of general anesthetic was transferred to the PACU in stable condition. At the conclusion of the procedure all instrument, sponge, needle counts are correct. Patient tolerated procedure extremely well.  Complications:  None apparent  EBL:  Minimal  Specimen:  #1 sentinel lymph node, #2 breast mass

## 2012-05-13 ENCOUNTER — Encounter (HOSPITAL_COMMUNITY): Payer: Self-pay

## 2012-05-31 ENCOUNTER — Encounter (HOSPITAL_COMMUNITY): Payer: Self-pay | Admitting: Oncology

## 2012-05-31 ENCOUNTER — Encounter (HOSPITAL_COMMUNITY): Payer: Medicaid Other | Attending: Oncology | Admitting: Oncology

## 2012-05-31 VITALS — BP 124/71 | HR 80 | Temp 98.1°F | Ht 64.25 in | Wt 188.2 lb

## 2012-05-31 DIAGNOSIS — Z170 Estrogen receptor positive status [ER+]: Secondary | ICD-10-CM

## 2012-05-31 DIAGNOSIS — C50419 Malignant neoplasm of upper-outer quadrant of unspecified female breast: Secondary | ICD-10-CM

## 2012-05-31 DIAGNOSIS — C50919 Malignant neoplasm of unspecified site of unspecified female breast: Secondary | ICD-10-CM

## 2012-05-31 MED ORDER — ANASTROZOLE 1 MG PO TABS: 1.0000 mg | ORAL_TABLET | Freq: Every day | ORAL | Status: DC

## 2012-05-31 NOTE — Patient Instructions (Addendum)
Kristen Garner  [id number] 12-09-1950 Dr. Everardo All   [facility]  Discharge Instructions  RECOMMENDATIONS MADE BY THE CONSULTANT AND ANY TEST RESULTS WILL BE SENT TO YOUR REFERRING DOCTOR.   EXAM FINDINGS BY MD TODAY AND SIGNS AND SYMPTOMS TO REPORT TO CLINIC OR PRIMARY MD: You have an Invasive Ductal Carcinoma without any lymph node involvement.  You need radiation therapy and we will make a referral to one of the radiation oncologist at [facility] in [place].  You will probably receive radiation therapy for about 6 1/2 weeks.  Once you complete radiation you need to start the hormonal agent Arimidex 2 weeks later, and will need to take it for 5 years.  Potential side effects are hot flashes, muscle and joint aches some have thinning of hair, skin rash and potential for blood clots.  You do not need chemotherapy.  Your prognosis is excellent and you can carry on a normal life.    MEDICATIONS PRESCRIBED: Arimidex 1 mg daily to start 2 weeks after completion of radiation therapy. Follow label directions Take Aleve 1 pill three times daily for 7 days to see if it helps your breast tenderness.  After taking it for 7 days you need to be off it for 7 days before taking it again.  INSTRUCTIONS GIVEN AND DISCUSSED: Other :  Talk with your primary care physician about trying to keep your blood sugars around 120 or lower.   Use a good sunscreen when outside. Can apply warm compresses to area on your breast.  If you start running a fever or start having chills and the breast becomes redder or more swollen contact your surgeon.  SPECIAL INSTRUCTIONS/FOLLOW-UP: Return to Clinic in 16 weeks to see Dr. Tressie Stalker.  We will call you with the date and time of your appointment with Radiation Oncology.   I acknowledge that I have been informed and understand all the instructions given to me and received a copy. I do not have any more questions at this time,  but understand that I may call the Specialty Clinic at [facility] at [phone] during business hours should I have any further questions or need assistance in obtaining follow-up care.    __________________________________________  _____________  __________ Signature of Patient or Authorized Representative            Date                   Time    __________________________________________ Nurse's Signature

## 2012-05-31 NOTE — Progress Notes (Signed)
Problem #1 stage II (T2 N0 MX) grade 1 infiltrating ductal carcinoma of the right breast strongly ER +100% PR +90% HER-2/neu not amplified all margins clear no LV I was seen and Ki-67 are her very low at 5% status post surgery on 05/12/2012 consisting of a lumpectomy sentinel node biopsy. Prior to that on 04/28/2012 she had a biopsy of the breast mass was felt to be DCIS and it was ER positive and PR positive as well the DCIS was felt to be intermediate grade on the lumpectomy specimen.  Problem #2 history of obesity having lost from 250 pounds in 2012, 188 pounds today by dietary restriction.  Problem #3 diabetes mellitus x10 years and she will see [name] for fine-tuning of her medication since her sugars run around 160 she states most of the time  Problem #4 history of cholecystectomy years ago problem #5 complete hysterectomy for benign reasons many years ago problem #6loss of all her upper teeth with a plate. Problem #7 depression on Celexa 20 mg a day. Problem #8 history of hypertension on Lopressor 50 mg twice a day.  This is a pleasant 61 year old Caucasian woman with no family history of breast cancer who was never able to breast feed her 2 children who both died as stillborn. She had her hysterectomy at age 1 due to a benign tumor she states attached to the uterus. She went through menopause she states in her 73s but states that she had a complete hysterectomy which does not make complete sense.  She does not smoke presently smoked for about 10 years less than a pack a day quitting at age 10. She does not use alcohol and does not use illicit drugs. She is divorced but has her present fiance with her today.  Vital signs are recorded and are in the chart. She is alert she is oriented. She states she still has pain in the right breast. She is not having fevers or chills. Her lungs are clear. Her left breast is negative. She has an incision on the right upper-outer quadrant of the right  breast which is healing nicely with minimal redness around it at best. She has an axillary incision which is well-healed no adenopathy in any location including cervical supraclavicular infraclavicular axillary or inguinal areas. She has an obese abdomen. Her liver is not palpable nor is her spleen palpable bowel sounds are normal. Her heart shows a regular rhythm and rate without murmur rub or gallop. She has an upper dental plate. Throat is clear. Tongue is in the midline and is normal. Lower teeth are in only fair repair. She has no peripheral edema but she does have psoriatic changes on to areas of her left leg and 2 areas on her abdomen which are suspicious but not diagnostic at this time of the same disorder. She has numerous benign seborrheic keratoses on her face and trunk she is alert and oriented. Good strength is maintained in the arms and legs symmetrically.  This lady has a very well-differentiated cancer of the right breast that carries an excellent prognosis. She is not meet the criteria for chemotherapy but can do very well with radiation therapy and post radiation hormonal therapy. Her prognosis is excellent. We will start the Arimidex 2 weeks after the completion of radiation therapy and we will see her back 6-8 weeks later to see how she is tolerating thinks. We went over the side effects in detail and have given her some handouts on that drug. She  knows to start only after the radiation therapy. If she has questions or problems she knows to call us. She will take Arimidex for 5 years at this time. If other data becomes available we will discuss things as things change.

## 2012-06-01 ENCOUNTER — Encounter (HOSPITAL_COMMUNITY): Payer: Self-pay

## 2012-06-01 NOTE — Progress Notes (Signed)
  Has Radiation Oncology appointment with Dr. Isidore Moos on 06/08/12.

## 2012-09-20 ENCOUNTER — Ambulatory Visit (HOSPITAL_COMMUNITY): Payer: Medicaid Other | Admitting: Oncology

## 2012-10-05 ENCOUNTER — Encounter (HOSPITAL_COMMUNITY): Payer: Self-pay | Admitting: Oncology

## 2012-10-05 ENCOUNTER — Encounter (HOSPITAL_COMMUNITY): Payer: Medicaid Other | Attending: Oncology | Admitting: Oncology

## 2012-10-05 VITALS — BP 172/97 | HR 72 | Temp 98.1°F | Resp 18 | Wt 195.0 lb

## 2012-10-05 DIAGNOSIS — Z0000 Encounter for general adult medical examination without abnormal findings: Secondary | ICD-10-CM

## 2012-10-05 DIAGNOSIS — Z23 Encounter for immunization: Secondary | ICD-10-CM

## 2012-10-05 DIAGNOSIS — Z170 Estrogen receptor positive status [ER+]: Secondary | ICD-10-CM

## 2012-10-05 DIAGNOSIS — F329 Major depressive disorder, single episode, unspecified: Secondary | ICD-10-CM

## 2012-10-05 DIAGNOSIS — C50419 Malignant neoplasm of upper-outer quadrant of unspecified female breast: Secondary | ICD-10-CM

## 2012-10-05 DIAGNOSIS — C50919 Malignant neoplasm of unspecified site of unspecified female breast: Secondary | ICD-10-CM

## 2012-10-05 DIAGNOSIS — F3289 Other specified depressive episodes: Secondary | ICD-10-CM

## 2012-10-05 MED ORDER — INFLUENZA VIRUS VACC SPLIT PF IM SUSP
0.5000 mL | Freq: Once | INTRAMUSCULAR | Status: AC
  Administered 2012-10-05: 0.5 mL via INTRAMUSCULAR

## 2012-10-05 MED ORDER — ANASTROZOLE 1 MG PO TABS: 1.0000 mg | ORAL_TABLET | Freq: Every day | ORAL | Status: AC

## 2012-10-05 MED ORDER — INFLUENZA VIRUS VACC SPLIT PF IM SUSP
INTRAMUSCULAR | Status: AC
  Filled 2012-10-05: qty 0.5

## 2012-10-05 NOTE — Progress Notes (Signed)
Purvis Kilts, MD [id number] [place] [address]  1. Breast cancer  anastrozole (ARIMIDEX) 1 MG tablet, CBC with Differential, Comprehensive metabolic panel  2. Preventative health care  influenza  inactive virus vaccine (FLUZONE/FLUARIX) injection 0.5 mL  3. Infiltrating ductal carcinoma of breast      CURRENT THERAPY: Starting Arimidex tomorrow 10/06/2012  INTERVAL HISTORY: Kristen Garner 61 y.o. female returns for  regular  visit for followup of Stage II (T2 N0 MX) grade 1 infiltrating ductal carcinoma of the right breast strongly ER +100% PR +90% HER-2/neu not amplified all margins clear no LV I was seen and Ki-67 are her very low at 5% status post surgery on 05/12/2012 consisting of a lumpectomy sentinel node biopsy. Prior to that on 04/28/2012 she had a biopsy of the breast mass was felt to be DCIS and it was ER positive and PR positive as well the DCIS was felt to be intermediate grade on the lumpectomy specimen.    Reason is here for followup. She completed radiation therapy under the care of Dr. Orlene Erm in October of 2013. She's not exactly sure of the date. On her last encounter here at the clinic, Dr. Tressie Stalker, in July, prescribed her Arimidex to start 2 weeks after completion of radiation. This medication was E. scribed to [facility]. The patient admits that she was never notified that the prescription was prepared for her and therefore she has not started the Arimidex. To avoid this from happening in the future, I printed her a prescription for Arimidex and she will turn into her pharmacy and she will start that today or tomorrow. We discussed the risks, benefits, alternatives, and side effects of Arimidex including hot flashes, arthralgias, and myalgias. She is agreeable to take this medication. I discussed with her that this medication will not only help with her initial breast cancer but will also protect the opposite breast from an estrogen  growing breast cancer. She will take this medication for 5 years.  She reports some discomfort occasionally in each breast. She appreciates that when she overexerts herself. She is large breasted and does have supporting bras at home.  She does continue have some skin peeling of the right breast that was irradiated. I've encouraged her to try some moisturizing lotion to this area.  The patient asks that she have a flu shot and we'll provide that for her today.  She was encouraged to have a mammogram in June of 2014 for screening purposes.  Other than the aforementioned, the patient denies any complaints. Complete ROS questioning is negative.  Past Medical History  Diagnosis Date  . Hypertension   . Irregular heart beat   . Anxiety   . Depression   . Diabetes mellitus     takes only the pill  . Headache     occiasional migraine  . Breast cancer     right    has ANXIETY DEPRESSION; ATRIAL TACHYCARDIA; HEARTBURN; and Infiltrating ductal carcinoma of breast on her problem list.     is allergic to codeine; penicillins; sulfonamide derivatives; and iodine.  We administered influenza  inactive virus vaccine.  Past Surgical History  Procedure Date  . Abdominal hysterectomy   . Cholecystectomy   . Breast surgery     biopsy then wide excision   . Sentinel lymph node biopsy     Denies any headaches, dizziness, double vision, fevers, chills, night sweats, nausea, vomiting, diarrhea, constipation, chest pain, heart palpitations, shortness of  breath, blood in stool, black tarry stool, urinary pain, urinary burning, urinary frequency, hematuria.   PHYSICAL EXAMINATION  ECOG PERFORMANCE STATUS: 1 - Symptomatic but completely ambulatory  Filed Vitals:   10/05/12 1331  BP: 172/97  Pulse: 72  Temp: 98.1 F (36.7 C)  Resp: 18    GENERAL:alert, no distress, well nourished, well developed, comfortable, cooperative, obese and smiling SKIN: skin color, texture, turgor are normal, no  rashes or significant lesions HEAD: Normocephalic, No masses, lesions, tenderness or abnormalities EYES: normal, Conjunctiva are pink and non-injected EARS: External ears normal OROPHARYNX:lips, buccal mucosa, and tongue normal and mucous membranes are moist  NECK: supple, no adenopathy, thyroid normal size, non-tender, without nodularity, no stridor, non-tender, trachea midline LYMPH:  no palpable lymphadenopathy, no hepatosplenomegaly BREAST:left breast normal without mass, skin or nipple changes or axillary nodes, right post-lumpectomy site well healed and free of suspicious changes with some thickening and hyperpigmentation from radiation.  No distinct lesion noted. Skin is noted to be dry and peeling.  LUNGS: clear to auscultation and percussion HEART: regular rate & rhythm, no murmurs, no gallops, S1 normal and S2 normal ABDOMEN:abdomen soft, non-tender, obese, normal bowel sounds, no masses or organomegaly and no hepatosplenomegaly BACK: Back symmetric, no curvature., No CVA tenderness EXTREMITIES:less then 2 second capillary refill, no joint deformities, effusion, or inflammation, no edema, no skin discoloration, no clubbing, no cyanosis  NEURO: alert & oriented x 3 with fluent speech, no focal motor/sensory deficits, gait normal   LABORATORY DATA: CBC    Component Value Date/Time   WBC 7.7 05/07/2012 0830   RBC 4.97 05/07/2012 0830   HGB 14.7 05/07/2012 0830   HCT 42.1 05/07/2012 0830   PLT 201 05/07/2012 0830   MCV 84.7 05/07/2012 0830   MCH 29.6 05/07/2012 0830   MCHC 34.9 05/07/2012 0830   RDW 13.5 05/07/2012 0830   LYMPHSABS 1.4 05/07/2012 0830   MONOABS 0.6 05/07/2012 0830   EOSABS 0.4 05/07/2012 0830   BASOSABS 0.1 05/07/2012 0830      Chemistry      Component Value Date/Time   NA 137 05/07/2012 0830   K 4.1 05/07/2012 0830   CL 99 05/07/2012 0830   CO2 25 05/07/2012 0830   BUN 14 05/07/2012 0830   CREATININE 0.79 05/07/2012 0830      Component Value Date/Time   CALCIUM 9.9  05/07/2012 0830   ALKPHOS 98 07/19/2010 1945   AST 20 07/19/2010 1945   ALT 19 07/19/2010 1945   BILITOT 0.5 07/19/2010 1945      PATHOLOGY: 05/12/2012  ADDITIONAL INFORMATION: 2. PROGNOSTIC INDICATORS - ACIS Results IMMUNOHISTOCHEMICAL AND MORPHOMETRIC ANALYSIS BY THE AUTOMATED CELLULAR IMAGING SYSTEM (ACIS) Estrogen Receptor (Negative, <1%): 100%, STRONG STAINING INTENSITY(MANUALLY) Progesterone Receptor (Negative, <1%): 90%, STRONG STAINING INTENSITY(MANUALLY) Proliferation Marker Ki67 by M IB-1 (Low<20%): 5% (MANUALLY) All controls stained appropriately Enid Cutter MD Pathologist, Electronic Signature ( Signed 05/18/2012) 2. CHROMOGENIC IN-SITU HYBRIDIZATION Interpretation HER-2/NEU BY CISH - NO AMPLIFICATION OF HER-2 DETECTED. THE RATIO OF HER-2: CEP 17 SIGNALS WAS 1.21. Reference range: Ratio: HER2:CEP17 < 1.8 - gene amplification not observed Ratio: HER2:CEP 17 1.8-2.2 - equivocal result Ratio: HER2:CEP17 > 2.2 - gene amplification observed Enid Cutter MD Pathologist, Electronic Signature ( Signed 05/18/2012) 1 of 4 FINAL for ABELA, HALBRITTER D [phone]) FINAL DIAGNOSIS Diagnosis 1. Lymph node, sentinel, biopsy, right breast axillary - ONE LYMPH NODE, NEGATIVE FOR METASTATIC CARCINOMA (0/1). 2. Breast, lumpectomy, right - INVASIVE DUCTAL CARCINOMA, 2.1 CM, NOTTHINGHAM COMBINED HISTOLOGIC GRADE I. - ADJACENT  INTERMEDIATE GRADE DUCTAL CARCINOMA IN SITU. - NO EVIDENCE OF ANGIOLYMPHATIC INVASION IDENTIFIED. - ALL THE RESECTION MARGINS, NEGATIVE FOR ATYPIA OR MALIGNANCY. Microscopic Comment 2. BREAST, INVASIVE TUMOR, WITH LYMPH NODE SAMPLING Specimen, including laterality: Right breast. Procedure: Lumpectomy. Grade: I Tubule formation: 3 Nuclear pleomorphism: 1 Mitotic:1 Tumor size (gross measurement): 2.1 cm Margins: Negative. Invasive, distance to closest margin: superior margin; 0.3 cm In-situ, distance to closest margin: superior margin; 0.3  cm Lymphovascular invasion: Not identified. Ductal carcinoma in situ: Present. Grade: Intermediate. Extensive intraductal component: Present Lobular neoplasia: No. Tumor focality: Unifocal Treatment effect: No. Extent of tumor: Skin: N/A Nipple: N/A Skeletal muscle: N/A Lymph nodes: # examined: 1 Lymph nodes with metastasis: 0 Isolated tumor cells (< 0.2 mm): 0 Micrometastasis: (> 0.2 mm and < 2.0 mm): 0 Macrometastasis: (> 2.0 mm): 0 Extracapsular extension: Breast prognostic profile: Will be performed and an addendum report will follow. Non-neoplastic breast: Unremarkable. TNM: pT2, pN0(sn-) (HCL:gt, 05/14/12) Aldona Bar MD Pathologist, Electronic Signature (Case signed 05/14/2012)     ASSESSMENT:  1. Stage II (T2 N0 MX) grade 1 infiltrating ductal carcinoma of the right breast strongly ER +100% PR +90% HER-2/neu not amplified all margins clear no LV I was seen and Ki-67 are her very low at 5% status post surgery on 05/12/2012 consisting of a lumpectomy sentinel node biopsy. Prior to that on 04/28/2012 she had a biopsy of the breast mass was felt to be DCIS and it was ER positive and PR positive as well the DCIS was felt to be intermediate grade on the lumpectomy specimen.  2. History of obesity having lost from 250 pounds in 2012, 188 pounds today by dietary restriction.  3. Diabetes mellitus x10 years and she is seeing [name] for management  4. History of cholecystectomy years ago   5 Complete hysterectomy for benign reasons many years ago  6. Loss of all her upper teeth with a plate.  7. Depression on Celexa 20 mg a day.  8. History of hypertension on Lopressor 50 mg twice a day.   PLAN:  1. Printed Rx for Arimidex provided to the patient today.  She will start today or tomorrow.  2. Discussed the risks, benefits, alternatives, and side effects of Arimidex 3. Flu shot today 4. Patient encouraged to have her next screening mammogram in June of 2014 5. Patient  encouraged to utilize moisturizing lotion to right breast for dry skin and peeling of skin. 6. Laboratory work in 8-10 weeks: CBC with differential and complete metabolic panel. 7. Return to clinic in 10 weeks time for followup to evaluate tolerance of Arimidex. If tolerated well, will see the patient every 6 months.   All questions were answered. The patient knows to call the clinic with any problems, questions or concerns. We can certainly see the patient much sooner if necessary.  The patient and plan discussed with Everardo All, MD and he is in agreement with the aforementioned.  Patient seen and examined by Dr. Everardo All as well.  Afreen Siebels

## 2012-10-05 NOTE — Patient Instructions (Signed)
[facility]  Discharge Instructions  RECOMMENDATIONS MADE BY THE CONSULTANT AND ANY TEST RESULTS WILL BE SENT TO YOUR REFERRING DOCTOR.   EXAM FINDINGS BY MD TODAY AND SIGNS AND SYMPTOMS TO REPORT TO CLINIC OR PRIMARY MD:  Exam per Dr. Tressie Stalker and Caroleen Hamman PA  MEDICATIONS PRESCRIBED: Arimidex prescription given. Take 1 daily for 5 years S/E hot flashes, muscle aches, and joints aching. We are looking for worse/more debilitating aching than you are having now.  INSTRUCTIONS GIVEN AND DISCUSSED: Mammogram due in June 2014  SPECIAL INSTRUCTIONS/FOLLOW-UP: 10 weeks    I acknowledge that I have been informed and understand all the instructions given to me and received a copy. I do not have any more questions at this time, but understand that I may call the [facility] at [facility] at [phone] during business hours should I have any further questions or need assistance in obtaining follow-up care.    __________________________________________  _____________  __________ Signature of Patient or Authorized Representative            Date                   Time    __________________________________________ Nurse's Signature

## 2012-10-05 NOTE — Progress Notes (Signed)
Kristen Garner presents today for injection per MD orders. Flu vaccine administered im  in left Upper Arm. Administration without incident. Patient tolerated well.

## 2012-12-06 ENCOUNTER — Other Ambulatory Visit (HOSPITAL_COMMUNITY): Payer: Medicaid Other

## 2012-12-08 ENCOUNTER — Ambulatory Visit (HOSPITAL_COMMUNITY): Payer: Medicaid Other | Admitting: Oncology

## 2012-12-08 ENCOUNTER — Encounter (HOSPITAL_COMMUNITY): Payer: Self-pay | Admitting: Oncology

## 2012-12-08 NOTE — Progress Notes (Signed)
This encounter was created in error - please disregard.

## 2012-12-13 ENCOUNTER — Encounter (HOSPITAL_COMMUNITY): Payer: Medicaid Other | Attending: Oncology | Admitting: Oncology

## 2012-12-13 VITALS — BP 149/125 | HR 75 | Temp 97.6°F | Resp 18 | Wt 197.4 lb

## 2012-12-13 DIAGNOSIS — Z170 Estrogen receptor positive status [ER+]: Secondary | ICD-10-CM

## 2012-12-13 DIAGNOSIS — C50919 Malignant neoplasm of unspecified site of unspecified female breast: Secondary | ICD-10-CM | POA: Insufficient documentation

## 2012-12-13 DIAGNOSIS — C50419 Malignant neoplasm of upper-outer quadrant of unspecified female breast: Secondary | ICD-10-CM

## 2012-12-13 LAB — COMPREHENSIVE METABOLIC PANEL
Albumin: 3.8 g/dL (ref 3.5–5.2)
Alkaline Phosphatase: 113 U/L (ref 39–117)
BUN: 14 mg/dL (ref 6–23)
Chloride: 98 mEq/L (ref 96–112)
Creatinine, Ser: 0.91 mg/dL (ref 0.50–1.10)
GFR calc Af Amer: 77 mL/min — ABNORMAL LOW (ref >90)
Glucose, Bld: 211 mg/dL — ABNORMAL HIGH (ref 70–99)
Potassium: 3.6 mEq/L (ref 3.5–5.1)
Total Bilirubin: 0.4 mg/dL (ref 0.3–1.2)

## 2012-12-13 LAB — CBC WITH DIFFERENTIAL/PLATELET
Basophils Relative: 1 % (ref 0–1)
Eosinophils Absolute: 0.2 10*3/uL (ref 0.0–0.7)
HCT: 39.9 % (ref 36.0–46.0)
Hemoglobin: 13.7 g/dL (ref 12.0–15.0)
Lymphs Abs: 1.2 10*3/uL (ref 0.7–4.0)
MCH: 29.8 pg (ref 26.0–34.0)
MCHC: 34.3 g/dL (ref 30.0–36.0)
Monocytes Absolute: 0.4 10*3/uL (ref 0.1–1.0)
Monocytes Relative: 6 % (ref 3–12)
Neutro Abs: 4.7 10*3/uL (ref 1.7–7.7)
RBC: 4.6 MIL/uL (ref 3.87–5.11)

## 2012-12-13 NOTE — Progress Notes (Signed)
Kristen Garner presented for labwork. Labs per MD order drawn via Peripheral Line 25 gauge needle inserted in lt ac.  Good blood return present. Procedure without incident.  Needle removed intact. Patient tolerated procedure well.

## 2012-12-13 NOTE — Progress Notes (Signed)
Kristen Kilts, MD [id number] [place] [address]  1. Infiltrating ductal carcinoma of breast     CURRENT THERAPY: Started Arimidex on 10/06/2012.  INTERVAL HISTORY: Kristen Garner 62 y.o. female returns for  regular  visit for followup of Stage II (T2 N0 MX) grade 1 infiltrating ductal carcinoma of the right breast strongly ER +100% PR +90% HER-2/neu not amplified all margins clear no LV I was seen and Ki-67 are her very low at 5% status post surgery on 05/12/2012 consisting of a lumpectomy sentinel node biopsy. Prior to that on 04/28/2012 she had a biopsy of the breast mass was felt to be DCIS and it was ER positive and PR positive as well the DCIS was felt to be intermediate grade on the lumpectomy specimen.   Kristen Garner is doing well.  She is tolerating the Arimidex well and has been very compliant with taking it every day.    She does admit to occasional hot flashes.  These are mild and no intervention is required.   She denies any arthralgias and myalgias.   She reports some tenderness in her right breast at the site of lumpectomy and radiation.  She describes the pain as aching and constant.  She denies any radiation of the pain.  She has been using NSAIDs and heat at home with some effectiveness.    She otherwise denies any complaints.  Complete ROS questioning is negative.     Past Medical History  Diagnosis Date  . Hypertension   . Irregular heart beat   . Anxiety   . Depression   . Diabetes mellitus     takes only the pill  . Headache     occiasional migraine  . Breast cancer     right    has ANXIETY DEPRESSION; ATRIAL TACHYCARDIA; HEARTBURN; and Infiltrating ductal carcinoma of breast on her problem list.     is allergic to codeine; penicillins; sulfonamide derivatives; and iodine.  Kristen Garner does not currently have medications on file.  Past Surgical History  Procedure Date  . Abdominal hysterectomy   . Cholecystectomy     . Breast surgery     biopsy then wide excision   . Sentinel lymph node biopsy     Denies any headaches, dizziness, double vision, fevers, chills, night sweats, nausea, vomiting, diarrhea, constipation, chest pain, heart palpitations, shortness of breath, blood in stool, black tarry stool, urinary pain, urinary burning, urinary frequency, hematuria.   PHYSICAL EXAMINATION  ECOG PERFORMANCE STATUS: 1 - Symptomatic but completely ambulatory  Filed Vitals:   12/13/12 1128  BP: 149/125  Pulse: 75  Temp: 97.6 F (36.4 C)  Resp: 18    GENERAL:alert, no distress, well nourished, well developed, comfortable, cooperative, obese and smiling SKIN: skin color, texture, turgor are normal, no rashes or significant lesions HEAD: Normocephalic, No masses, lesions, tenderness or abnormalities EYES: normal, Conjunctiva are pink and non-injected EARS: External ears normal OROPHARYNX:lips, buccal mucosa, and tongue normal and mucous membranes are moist  NECK: supple, no adenopathy, thyroid normal size, non-tender, without nodularity, no stridor, non-tender, trachea midline LYMPH:  no palpable lymphadenopathy, no hepatosplenomegaly BREAST:left breast normal without mass, skin or nipple changes or axillary nodes, right post-lumpectomy site well healed and free of suspicious changes but with erythema and tenderness on palpation. LUNGS: clear to auscultation and percussion HEART: regular rate & rhythm, no murmurs, no gallops, S1 normal and S2 normal ABDOMEN:abdomen soft, non-tender, obese, normal bowel sounds, no masses  or organomegaly and no hepatosplenomegaly BACK: Back symmetric, no curvature., No CVA tenderness EXTREMITIES:less then 2 second capillary refill, no joint deformities, effusion, or inflammation, no edema, no skin discoloration, no clubbing, no cyanosis  NEURO: alert & oriented x 3 with fluent speech, no focal motor/sensory deficits, gait normal    LABORATORY DATA: CBC    Component  Value Date/Time   WBC 7.7 05/07/2012 0830   RBC 4.97 05/07/2012 0830   HGB 14.7 05/07/2012 0830   HCT 42.1 05/07/2012 0830   PLT 201 05/07/2012 0830   MCV 84.7 05/07/2012 0830   MCH 29.6 05/07/2012 0830   MCHC 34.9 05/07/2012 0830   RDW 13.5 05/07/2012 0830   LYMPHSABS 1.4 05/07/2012 0830   MONOABS 0.6 05/07/2012 0830   EOSABS 0.4 05/07/2012 0830   BASOSABS 0.1 05/07/2012 0830      Chemistry      Component Value Date/Time   NA 137 05/07/2012 0830   K 4.1 05/07/2012 0830   CL 99 05/07/2012 0830   CO2 25 05/07/2012 0830   BUN 14 05/07/2012 0830   CREATININE 0.79 05/07/2012 0830      Component Value Date/Time   CALCIUM 9.9 05/07/2012 0830   ALKPHOS 98 07/19/2010 1945   AST 20 07/19/2010 1945   ALT 19 07/19/2010 1945   BILITOT 0.5 07/19/2010 1945        PATHOLOGY: 05/12/2012  ADDITIONAL INFORMATION:  2. PROGNOSTIC INDICATORS - ACIS  Results  IMMUNOHISTOCHEMICAL AND MORPHOMETRIC ANALYSIS BY THE AUTOMATED CELLULAR  IMAGING SYSTEM (ACIS)  Estrogen Receptor (Negative, <1%): 100%, STRONG STAINING INTENSITY(MANUALLY)  Progesterone Receptor (Negative, <1%): 90%, STRONG STAINING INTENSITY(MANUALLY)  Proliferation Marker Ki67 by M IB-1 (Low<20%): 5% (MANUALLY)  All controls stained appropriately  Enid Cutter MD  Pathologist, Electronic Signature  ( Signed 05/18/2012)  2. CHROMOGENIC IN-SITU HYBRIDIZATION  Interpretation  HER-2/NEU BY CISH - NO AMPLIFICATION OF HER-2 DETECTED. THE RATIO OF HER-2: CEP 17  SIGNALS WAS 1.21.  Reference range:  Ratio: HER2:CEP17 < 1.8 - gene amplification not observed  Ratio: HER2:CEP 17 1.8-2.2 - equivocal result  Ratio: HER2:CEP17 > 2.2 - gene amplification observed  Enid Cutter MD  Pathologist, Electronic Signature  ( Signed 05/18/2012)  1 of 4  FINAL for DOAN, DUNKER D [phone])  FINAL DIAGNOSIS  Diagnosis  1. Lymph node, sentinel, biopsy, right breast axillary  - ONE LYMPH NODE, NEGATIVE FOR METASTATIC CARCINOMA (0/1).  2. Breast, lumpectomy,  right  - INVASIVE DUCTAL CARCINOMA, 2.1 CM, NOTTHINGHAM COMBINED HISTOLOGIC GRADE I.  - ADJACENT INTERMEDIATE GRADE DUCTAL CARCINOMA IN SITU.  - NO EVIDENCE OF ANGIOLYMPHATIC INVASION IDENTIFIED.  - ALL THE RESECTION MARGINS, NEGATIVE FOR ATYPIA OR MALIGNANCY.  Microscopic Comment  2. BREAST, INVASIVE TUMOR, WITH LYMPH NODE SAMPLING  Specimen, including laterality: Right breast.  Procedure: Lumpectomy.  Grade: I  Tubule formation: 3  Nuclear pleomorphism: 1  Mitotic:1  Tumor size (gross measurement): 2.1 cm  Margins: Negative.  Invasive, distance to closest margin: superior margin; 0.3 cm  In-situ, distance to closest margin: superior margin; 0.3 cm  Lymphovascular invasion: Not identified.  Ductal carcinoma in situ: Present.  Grade: Intermediate.  Extensive intraductal component: Present  Lobular neoplasia: No.  Tumor focality: Unifocal  Treatment effect: No.  Extent of tumor:  Skin: N/A  Nipple: N/A  Skeletal muscle: N/A  Lymph nodes:  # examined: 1  Lymph nodes with metastasis: 0  Isolated tumor cells (< 0.2 mm): 0  Micrometastasis: (> 0.2 mm and < 2.0 mm): 0  Macrometastasis: (>  2.0 mm): 0  Extracapsular extension:  Breast prognostic profile: Will be performed and an addendum report will follow.  Non-neoplastic breast: Unremarkable.  TNM: pT2, pN0(sn-)  (HCL:gt, 05/14/12)  Aldona Bar MD  Pathologist, Electronic Signature  (Case signed 05/14/2012)     ASSESSMENT:  1. Stage II (T2 N0 MX) grade 1 infiltrating ductal carcinoma of the right breast strongly ER +100% PR +90% HER-2/neu not amplified all margins clear no LV I was seen and Ki-67 are her very low at 5% status post surgery on 05/12/2012 consisting of a lumpectomy sentinel node biopsy. Prior to that on 04/28/2012 she had a biopsy of the breast mass was felt to be DCIS and it was ER positive and PR positive as well the DCIS was felt to be intermediate grade on the lumpectomy specimen. S/P radiation therapy by  Dr. Orlene Erm finishing in October 2013.  Now on Arimidex starting on October 05, 2012.  She will take that for 5 years.  2. History of obesity having lost from 250 pounds in 2012, 188 pounds today by dietary restriction.  3. Diabetes mellitus x10 years and she is seeing [name] for management  4. History of cholecystectomy years ago  5 Complete hysterectomy for benign reasons many years ago  6. Loss of all her upper teeth with a plate.  7. Depression on Celexa 20 mg a day.  8. History of hypertension on Lopressor 50 mg twice a day.    PLAN:  1. Lab work today: CBC diff, CMET 2. Continue Arimidex daily.   3. Recommend heat/ice to right breast area at the site of surgery due to her discomfort.  Patient also encouraged to use an NSAID for the discomfort.  4. Patient encouraged to have her next screening mammogram in June of 2014 5. Return in 4 months for follow-up. May consider spacing out appointments to every 6 months pending her exam in 4 months.     All questions were answered. The patient knows to call the clinic with any problems, questions or concerns. We can certainly see the patient much sooner if necessary.  The patient and plan discussed with Everardo All, MD and he is in agreement with the aforementioned.  Patient seen and examined by Dr. Tressie Stalker as well.   Dayshaun Whobrey

## 2012-12-13 NOTE — Patient Instructions (Addendum)
.  [facility] Discharge Instructions  RECOMMENDATIONS MADE BY THE CONSULTANT AND ANY TEST RESULTS WILL BE SENT TO YOUR REFERRING PHYSICIAN.  EXAM FINDINGS BY THE PHYSICIAN TODAY AND SIGNS OR SYMPTOMS TO REPORT TO CLINIC OR PRIMARY PHYSICIAN: exam good  The tenderness in your breast is post surgical changes and b/c you have had radiation. Please contact us if this worsens.    SPECIAL INSTRUCTIONS/FOLLOW-UP: labs today and return in 4 months  Thank you for choosing [facility] to provide your oncology and hematology care.  To afford each patient quality time with our providers, please arrive at least 15 minutes before your scheduled appointment time.  With your help, our goal is to use those 15 minutes to complete the necessary work-up to ensure our physicians have the information they need to help with your evaluation and healthcare recommendations.    Effective January 1st, 2014, we ask that you re-schedule your appointment with our physicians should you arrive 10 or more minutes late for your appointment.  We strive to give you quality time with our providers, and arriving late affects you and other patients whose appointments are after yours.    Again, thank you for choosing [facility].  Our hope is that these requests will decrease the amount of time that you wait before being seen by our physicians.       _____________________________________________________________  Should you have questions after your visit to [facility], please contact our office at [phone] between the hours of 8:30 a.m. and 5:00 p.m.  Voicemails left after 4:30 p.m. will not be returned until the following business day.  For prescription refill requests, have your pharmacy contact our office with your prescription refill request.

## 2013-03-28 ENCOUNTER — Other Ambulatory Visit (HOSPITAL_COMMUNITY): Payer: Self-pay | Admitting: Oncology

## 2013-03-28 DIAGNOSIS — C50911 Malignant neoplasm of unspecified site of right female breast: Secondary | ICD-10-CM

## 2013-04-04 ENCOUNTER — Encounter (HOSPITAL_COMMUNITY): Payer: Self-pay

## 2013-04-04 ENCOUNTER — Inpatient Hospital Stay (HOSPITAL_COMMUNITY)
Admission: EM | Admit: 2013-04-04 | Discharge: 2013-04-06 | DRG: 683 | Disposition: A | Discharge status: Home / Self Care | Payer: Medicaid Other | Attending: Internal Medicine | Admitting: Internal Medicine

## 2013-04-04 DIAGNOSIS — Z87891 Personal history of nicotine dependence: Secondary | ICD-10-CM

## 2013-04-04 DIAGNOSIS — F3289 Other specified depressive episodes: Secondary | ICD-10-CM | POA: Diagnosis present

## 2013-04-04 DIAGNOSIS — Z882 Allergy status to sulfonamides status: Secondary | ICD-10-CM

## 2013-04-04 DIAGNOSIS — R197 Diarrhea, unspecified: Secondary | ICD-10-CM | POA: Diagnosis present

## 2013-04-04 DIAGNOSIS — N179 Acute kidney failure, unspecified: Principal | ICD-10-CM | POA: Diagnosis present

## 2013-04-04 DIAGNOSIS — Z79899 Other long term (current) drug therapy: Secondary | ICD-10-CM

## 2013-04-04 DIAGNOSIS — E860 Dehydration: Secondary | ICD-10-CM | POA: Diagnosis present

## 2013-04-04 DIAGNOSIS — E119 Type 2 diabetes mellitus without complications: Secondary | ICD-10-CM

## 2013-04-04 DIAGNOSIS — E872 Acidosis, unspecified: Secondary | ICD-10-CM | POA: Diagnosis present

## 2013-04-04 DIAGNOSIS — F411 Generalized anxiety disorder: Secondary | ICD-10-CM | POA: Diagnosis present

## 2013-04-04 DIAGNOSIS — R12 Heartburn: Secondary | ICD-10-CM

## 2013-04-04 DIAGNOSIS — R630 Anorexia: Secondary | ICD-10-CM | POA: Diagnosis present

## 2013-04-04 DIAGNOSIS — E876 Hypokalemia: Secondary | ICD-10-CM | POA: Diagnosis present

## 2013-04-04 DIAGNOSIS — N390 Urinary tract infection, site not specified: Secondary | ICD-10-CM | POA: Diagnosis present

## 2013-04-04 DIAGNOSIS — Z886 Allergy status to analgesic agent status: Secondary | ICD-10-CM

## 2013-04-04 DIAGNOSIS — F329 Major depressive disorder, single episode, unspecified: Secondary | ICD-10-CM | POA: Diagnosis present

## 2013-04-04 DIAGNOSIS — F341 Dysthymic disorder: Secondary | ICD-10-CM | POA: Diagnosis present

## 2013-04-04 DIAGNOSIS — E871 Hypo-osmolality and hyponatremia: Secondary | ICD-10-CM | POA: Diagnosis present

## 2013-04-04 DIAGNOSIS — Z6832 Body mass index (BMI) 32.0-32.9, adult: Secondary | ICD-10-CM

## 2013-04-04 DIAGNOSIS — C50919 Malignant neoplasm of unspecified site of unspecified female breast: Secondary | ICD-10-CM | POA: Diagnosis present

## 2013-04-04 DIAGNOSIS — Z880 Allergy status to penicillin: Secondary | ICD-10-CM

## 2013-04-04 DIAGNOSIS — E1165 Type 2 diabetes mellitus with hyperglycemia: Secondary | ICD-10-CM | POA: Diagnosis present

## 2013-04-04 DIAGNOSIS — D72829 Elevated white blood cell count, unspecified: Secondary | ICD-10-CM | POA: Diagnosis present

## 2013-04-04 DIAGNOSIS — G43909 Migraine, unspecified, not intractable, without status migrainosus: Secondary | ICD-10-CM | POA: Diagnosis present

## 2013-04-04 DIAGNOSIS — I10 Essential (primary) hypertension: Secondary | ICD-10-CM | POA: Diagnosis not present

## 2013-04-04 DIAGNOSIS — E669 Obesity, unspecified: Secondary | ICD-10-CM | POA: Diagnosis present

## 2013-04-04 DIAGNOSIS — IMO0001 Reserved for inherently not codable concepts without codable children: Secondary | ICD-10-CM | POA: Diagnosis present

## 2013-04-04 DIAGNOSIS — I498 Other specified cardiac arrhythmias: Secondary | ICD-10-CM

## 2013-04-04 LAB — URINALYSIS, ROUTINE W REFLEX MICROSCOPIC
Bilirubin Urine: NEGATIVE
Ketones, ur: NEGATIVE mg/dL
Nitrite: NEGATIVE
Specific Gravity, Urine: 1.015 (ref 1.005–1.030)
Urobilinogen, UA: 0.2 mg/dL (ref 0.0–1.0)

## 2013-04-04 LAB — GLUCOSE, CAPILLARY
Glucose-Capillary: 184 mg/dL — ABNORMAL HIGH (ref 70–99)
Glucose-Capillary: 159 mg/dL — ABNORMAL HIGH (ref 70–99)
Glucose-Capillary: 132 mg/dL — ABNORMAL HIGH (ref 70–99)

## 2013-04-04 LAB — CBC WITH DIFFERENTIAL/PLATELET
Basophils Absolute: 0.1 10*3/uL (ref 0.0–0.1)
Lymphocytes Relative: 14 % (ref 12–46)
Lymphs Abs: 1.7 10*3/uL (ref 0.7–4.0)
MCV: 83 fL (ref 78.0–100.0)
Neutro Abs: 9.5 10*3/uL — ABNORMAL HIGH (ref 1.7–7.7)
Neutrophils Relative %: 77 % (ref 43–77)
Platelets: 267 10*3/uL (ref 150–400)
RBC: 4.88 MIL/uL (ref 3.87–5.11)
RDW: 13.3 % (ref 11.5–15.5)
WBC: 12.2 10*3/uL — ABNORMAL HIGH (ref 4.0–10.5)

## 2013-04-04 LAB — URINE MICROSCOPIC-ADD ON

## 2013-04-04 LAB — COMPREHENSIVE METABOLIC PANEL
ALT: 14 U/L (ref 0–35)
AST: 11 U/L (ref 0–37)
Alkaline Phosphatase: 137 U/L — ABNORMAL HIGH (ref 39–117)
CO2: 15 mEq/L — ABNORMAL LOW (ref 19–32)
Calcium: 8.9 mg/dL (ref 8.4–10.5)
Chloride: 90 mEq/L — ABNORMAL LOW (ref 96–112)
GFR calc Af Amer: 6 mL/min — ABNORMAL LOW (ref >90)
GFR calc non Af Amer: 5 mL/min — ABNORMAL LOW (ref >90)
Glucose, Bld: 189 mg/dL — ABNORMAL HIGH (ref 70–99)
Potassium: 2.6 mEq/L — CL (ref 3.5–5.1)
Sodium: 128 mEq/L — ABNORMAL LOW (ref 135–145)
Total Bilirubin: 0.3 mg/dL (ref 0.3–1.2)

## 2013-04-04 LAB — BASIC METABOLIC PANEL
BUN: 69 mg/dL — ABNORMAL HIGH (ref 6–23)
Chloride: 97 mEq/L (ref 96–112)
Creatinine, Ser: 6.52 mg/dL — ABNORMAL HIGH (ref 0.50–1.10)
GFR calc Af Amer: 7 mL/min — ABNORMAL LOW (ref >90)
GFR calc non Af Amer: 6 mL/min — ABNORMAL LOW (ref >90)
Glucose, Bld: 182 mg/dL — ABNORMAL HIGH (ref 70–99)
Potassium: 2.9 mEq/L — ABNORMAL LOW (ref 3.5–5.1)

## 2013-04-04 LAB — CLOSTRIDIUM DIFFICILE BY PCR: Toxigenic C. Difficile by PCR: NEGATIVE

## 2013-04-04 MED ORDER — SODIUM CHLORIDE 0.9 % IJ SOLN
3.0000 mL | Freq: Two times a day (BID) | INTRAMUSCULAR | Status: DC
  Administered 2013-04-04 – 2013-04-05 (×2): 3 mL via INTRAVENOUS

## 2013-04-04 MED ORDER — POTASSIUM CHLORIDE 10 MEQ/100ML IV SOLN
10.0000 meq | INTRAVENOUS | Status: AC
  Administered 2013-04-04 (×4): 10 meq via INTRAVENOUS
  Filled 2013-04-04: qty 400
  Filled 2013-04-04: qty 100

## 2013-04-04 MED ORDER — ANASTROZOLE 1 MG PO TABS
1.0000 mg | ORAL_TABLET | Freq: Every day | ORAL | Status: DC
  Administered 2013-04-04 – 2013-04-06 (×3): 1 mg via ORAL
  Filled 2013-04-04 (×5): qty 1

## 2013-04-04 MED ORDER — POTASSIUM CHLORIDE 10 MEQ/100ML IV SOLN
INTRAVENOUS | Status: AC
  Administered 2013-04-04: 10 meq
  Filled 2013-04-04: qty 100

## 2013-04-04 MED ORDER — HEPARIN SODIUM (PORCINE) 5000 UNIT/ML IJ SOLN
5000.0000 [IU] | Freq: Three times a day (TID) | INTRAMUSCULAR | Status: DC
  Administered 2013-04-04 – 2013-04-06 (×6): 5000 [IU] via SUBCUTANEOUS
  Filled 2013-04-04 (×6): qty 1

## 2013-04-04 MED ORDER — FENTANYL CITRATE 0.05 MG/ML IJ SOLN
100.0000 ug | Freq: Once | INTRAMUSCULAR | Status: AC
  Administered 2013-04-04: 100 ug via INTRAVENOUS
  Filled 2013-04-04: qty 2

## 2013-04-04 MED ORDER — METRONIDAZOLE IN NACL 5-0.79 MG/ML-% IV SOLN
500.0000 mg | Freq: Three times a day (TID) | INTRAVENOUS | Status: DC
  Filled 2013-04-04 (×3): qty 100

## 2013-04-04 MED ORDER — INSULIN ASPART 100 UNIT/ML ~~LOC~~ SOLN
0.0000 [IU] | Freq: Three times a day (TID) | SUBCUTANEOUS | Status: DC
  Administered 2013-04-04 – 2013-04-05 (×3): 2 [IU] via SUBCUTANEOUS

## 2013-04-04 MED ORDER — SODIUM CHLORIDE 0.9 % IV BOLUS (SEPSIS)
1000.0000 mL | Freq: Once | INTRAVENOUS | Status: AC
  Administered 2013-04-04: 1000 mL via INTRAVENOUS

## 2013-04-04 MED ORDER — DIPHENOXYLATE-ATROPINE 2.5-0.025 MG PO TABS
2.0000 | ORAL_TABLET | Freq: Once | ORAL | Status: AC
  Administered 2013-04-04: 2 via ORAL
  Filled 2013-04-04: qty 2

## 2013-04-04 MED ORDER — ALBUTEROL SULFATE HFA 108 (90 BASE) MCG/ACT IN AERS
2.0000 | INHALATION_SPRAY | Freq: Four times a day (QID) | RESPIRATORY_TRACT | Status: DC | PRN
Start: 2013-04-04 — End: 2013-04-06

## 2013-04-04 MED ORDER — ONDANSETRON HCL 4 MG PO TABS: 4.0000 mg | ORAL_TABLET | Freq: Four times a day (QID) | ORAL | Status: DC | PRN

## 2013-04-04 MED ORDER — SODIUM CHLORIDE 0.9 % IV SOLN
INTRAVENOUS | Status: DC
  Administered 2013-04-04: via INTRAVENOUS

## 2013-04-04 MED ORDER — ONDANSETRON HCL 4 MG/2ML IJ SOLN: 4.0000 mg | Freq: Once | INTRAMUSCULAR | Status: DC

## 2013-04-04 MED ORDER — ACETAMINOPHEN 650 MG RE SUPP: 650.0000 mg | Freq: Four times a day (QID) | RECTAL | Status: DC | PRN

## 2013-04-04 MED ORDER — ONDANSETRON HCL 4 MG/2ML IJ SOLN: 4.0000 mg | Freq: Four times a day (QID) | INTRAMUSCULAR | Status: DC | PRN

## 2013-04-04 MED ORDER — ZOLPIDEM TARTRATE 5 MG PO TABS
5.0000 mg | ORAL_TABLET | Freq: Every evening | ORAL | Status: DC | PRN
  Administered 2013-04-04 – 2013-04-05 (×2): 5 mg via ORAL
  Filled 2013-04-04 (×2): qty 1

## 2013-04-04 MED ORDER — CITALOPRAM HYDROBROMIDE 20 MG PO TABS
40.0000 mg | ORAL_TABLET | Freq: Every day | ORAL | Status: DC
  Filled 2013-04-04: qty 2

## 2013-04-04 MED ORDER — PANTOPRAZOLE SODIUM 40 MG IV SOLR
40.0000 mg | Freq: Once | INTRAVENOUS | Status: AC
  Administered 2013-04-04: 40 mg via INTRAVENOUS
  Filled 2013-04-04: qty 40

## 2013-04-04 MED ORDER — ACETAMINOPHEN 325 MG PO TABS
650.0000 mg | ORAL_TABLET | Freq: Four times a day (QID) | ORAL | Status: DC | PRN
  Administered 2013-04-06: 650 mg via ORAL
  Filled 2013-04-04 (×2): qty 2

## 2013-04-04 NOTE — ED Notes (Signed)
CRITICAL VALUE ALERT  Critical value received:  Potassium 2.6  Date of notification:  04/04/2013  Time of notification:  0949  Critical value read back:yes  Nurse who received alert:  Christean Leaf, RN  MD notified (1st page):  Nat Christen  Time of first page:  [phone]  MD notified (2nd page):  Time of second page:  Responding MD:  Nat Christen  Time MD responded:  [phone]

## 2013-04-04 NOTE — H&P (Signed)
[facility] History and Physical  Kristen Garner [id number] DOB: January 03, 1951 DOA: 04/04/2013 Referring physician: cook PCP: Purvis Kilts, MD  Specialists:   Chief Complaint: diarrhea weakness  HPI: Kristen Garner is a pleasant 62 y.o. female with past medical hx that includes breast ca s/p lumpectomy 2013 and currently taking arimidex, HTN, DM oral agents, depression, irregular heart rhythm presents to ED with CC 6 days persistent diarrhea, nausea and vomiting. Information obtained from pt and significant other who is at bedside. Pt states developed diarrhea, n/v 6 days ago. 4 days ago went to PCP dx with "virus" and recommended immodium and bland diet. Pt states she took imodium yesterday and # of stools has decreased. States vomiting resolved 4 days ago but remains nauseated. Has been unable to tolerate food or fluids for last 5 days. States she tried to drink pedialyte but was unable.  Denies abdominal pain, dysuria, hematuria,  headache, sob, cp, dizziness. Reports feeling very weak.  States prior to this she had been given antibiotics for sinus infection. In addition, she has had recent sick contacts with same symptoms. She reports having at least 10 watery brown stools per day. Denies dark stool or bloody stool. Symptoms came on suddenly have persisted and characterized as severe. Work up in ED yields creatinine 7.78, C02 15, potassium 2.5, sodium 128. TRH asked to admit.   Review of Systems: The patient denies  fever, weight loss,, vision loss, decreased hearing, hoarseness, chest pain, syncope, dyspnea on exertion, peripheral edema, balance deficits, hemoptysis, abdominal pain, melena, hematochezia, severe indigestion/heartburn, hematuria, incontinence, genital sores, muscle weakness, suspicious skin lesions, transient blindness, difficulty walking, depression, unusual weight change, abnormal bleeding, enlarged lymph nodes, angioedema, and breast masses.    Past Medical  History  Diagnosis Date  . Hypertension   . Irregular heart beat   . Anxiety   . Depression   . Diabetes mellitus     takes only the pill  . Headache     occiasional migraine  . Breast cancer     right   Past Surgical History  Procedure Laterality Date  . Abdominal hysterectomy    . Cholecystectomy    . Breast surgery      biopsy then wide excision   . Sentinel lymph node biopsy     Social History:  reports that she has quit smoking. She has never used smokeless tobacco. She reports that she does not drink alcohol or use illicit drugs. Forme smoker quit 20 years ago. Lives with significant other, retired Engineer, manufacturing systems.   Allergies  Allergen Reactions  . Codeine Itching  . Penicillins Other (See Comments)    Child hood allergy  . Sulfonamide Derivatives Itching  . Iodine Rash    Family History  Problem Relation Age of Onset  . Diabetes Father   . Stroke Maternal Grandmother      Prior to Admission medications   Medication Sig Start Date End Date Taking? Authorizing Provider  albuterol (PROVENTIL HFA;VENTOLIN HFA) 108 (90 BASE) MCG/ACT inhaler Inhale 2 puffs into the lungs every 6 (six) hours as needed.   Yes Historical Provider, MD  anastrozole (ARIMIDEX) 1 MG tablet Take 1 mg by mouth daily.   Yes Historical Provider, MD  citalopram (CELEXA) 20 MG tablet Take 40 mg by mouth daily.    Yes Historical Provider, MD  glimepiride (AMARYL) 4 MG tablet Take 4 mg by mouth daily before breakfast.   Yes Historical Provider, MD  loperamide (IMODIUM) 2 MG capsule  Take 2 mg by mouth 4 (four) times daily as needed for diarrhea or loose stools.   Yes Historical Provider, MD  losartan (COZAAR) 50 MG tablet Take 50 mg by mouth daily.   Yes Historical Provider, MD  metoprolol (LOPRESSOR) 50 MG tablet Take 1 tablet (50 mg total) by mouth 2 (two) times daily. 09/11/11  Yes Deboraha Sprang, MD  Naproxen Sodium (ALEVE PO) Take 200 mg by mouth as needed (pain).    Yes Historical Provider, MD   ondansetron (ZOFRAN) 4 MG tablet Take 4 mg by mouth every 6 (six) hours as needed for nausea.   Yes Historical Provider, MD  zolpidem (AMBIEN) 10 MG tablet Take 10 mg by mouth at bedtime.   Yes Historical Provider, MD   Physical Exam: Filed Vitals:   04/04/13 0818 04/04/13 0923 04/04/13 1000  BP: 108/65 114/63 109/52  Pulse: 82  70  Temp: 97.8 F (36.6 C)    TempSrc: Oral    Resp: 18  14  Height: 5\' 4"  (1.626 m)    Weight: 87.091 kg (192 lb)    SpO2: 100%  98%     General:  Obese, somewhat ill appearing NAD  Eyes: PERRL EOMI no scleral icterus  ENT: ears clear, nose without drainage, mucus membrane mouth dry, slightly pale  Neck: supple no JVD no lymphadenopathy  Cardiovascular: irregular, no gallup no rub. No LE edema PPP  Respiratory: normal effort BS clear bilaterally no wheeze no rhonchi  Abdomen: obese soft +BS non-tender to palpation  Skin: cool, dry no rash no lesion  Musculoskeletal: no clubbing, no joint swelling/erythema  Psychiatric: calm cooperative  Neurologic: cranial nerve II-XI intact speech clear facial symmetry  Labs on Admission:  Basic Metabolic Panel:  Recent Labs Lab 04/04/13 0828  NA 128*  K 2.6*  CL 90*  CO2 15*  GLUCOSE 189*  BUN 73*  CREATININE 7.78*  CALCIUM 8.9   Liver Function Tests:  Recent Labs Lab 04/04/13 0828  AST 11  ALT 14  ALKPHOS 137*  BILITOT 0.3  PROT 8.0  ALBUMIN 4.2   No results found for this basename: LIPASE, AMYLASE,  in the last 168 hours No results found for this basename: AMMONIA,  in the last 168 hours CBC:  Recent Labs Lab 04/04/13 0828  WBC 12.2*  NEUTROABS 9.5*  HGB 15.1*  HCT 40.5  MCV 83.0  PLT 267   Cardiac Enzymes: No results found for this basename: CKTOTAL, CKMB, CKMBINDEX, TROPONINI,  in the last 168 hours  BNP (last 3 results) No results found for this basename: PROBNP,  in the last 8760 hours CBG: No results found for this basename: GLUCAP,  in the last 168  hours  Radiological Exams on Admission: No results found.  EKG: Independently reviewed. SR with PVC  Assessment/Plan Principal Problem:   Acute renal failure: likely pre-renal. Will admit to tele. Will vigorously hydrate with IV fluids. Hold any nephrotoxins. Monitor strict intake and output. Will check bmet in 2 hours. If creatinine not trending down will consider renal consult.  Active Problems: Diarrhea: etiology uncertain. Was recently on antibiotics so concern for cdiff. Will check stool for cdiff, culture, O&P. Will hold off on imodium until results. Pt states frequency of stools less. Will start flagyl.     Dehydration: related to #2 and anorexia. Will vigorously hydrate with IV fluid. Will monitor output. Recheck bmet in 2 hours.     Hypokalemia: related to #2. repleting with 5 runs of potassium IV. Admit  to tele. Monitor.     Hyponatremia: related to #2. IV fluids.     Metabolic acidosis: as a result of #2 and #1. IV fluids. Will recheck bmet in 2 hours and in am.     Leukocytosis: pt afebrile with stable VS. Likely responsive. Will monitor. Check urine.     Diabetes: will check HgA1c. Pt reports last level over 8. Will provide clear liquids and SSI for glycemic control. Hold amaryl,.     HTN (hypertension): currently stable. Will hold lopressor and cozaar. monitor    ANXIETY DEPRESSION: stable at baseline    Infiltrating ductal carcinoma of breast: takes arimidex. Stable.    none  Code Status: full Family Communication: significant other at bedside Disposition Plan:home when ready hopefully 24-48 hours  Time spent: 65 minutes  [facility] Pager [phone]  If 7PM-7AM, please contact night-coverage [email or web address] Password [facility] 04/04/2013, 10:59 AM Attending: Patient seen and examined in above note reviewed. This lady is clinically and biochemically severely dehydrated. Her Cozaar has perpetuated her renal failure. I think she will likely  improve with simple intravenous fluids. I agree with checking stool for C. difficile colitis. I do not think we need to obtain nephrology consultation at the present time but if her renal function deteriorates we will do so. Agree with checking electrolytes soon.

## 2013-04-04 NOTE — ED Notes (Signed)
The patient states that she has had diarrhea since Wednesday, nausea since this morning.  States that she has been feeling tired and weak since Wednesday, worse today.  States that she took her anti nausea medication this morning just prior to coming to the ED, which helped her c/o nausea.  States that she has not had an appetite since Wednesday.  States that she has had 2 episodes of diarrhea this morning and now feels worse.  States that she has had chills, but no confirmed fever.  States that 2 members of her family have had the same illness.

## 2013-04-04 NOTE — ED Provider Notes (Signed)
History     This chart was scribed for Nat Christen, MD, MD by Rhae Lerner, ED Scribe. The patient was seen in room APA05/APA05 and the patient's care was started at 9:15PM   CSN: [id number]  Arrival date & time 04/04/13  [id number]      Chief Complaint  Patient presents with  . Diarrhea  . Nausea    (Consider location/radiation/quality/duration/timing/severity/associated sxs/prior treatment) The history is provided by the patient and medical records. No language interpreter was used.   HPI Comments: Kristen Garner is a 62 y.o. female who presents to the Emergency Department complaining of constant, severe diarrhea onset 5 days that has worsened.  She reports having diarrhea about 10 times per day and nausea. Pt states that diarrhea is brown and  watery. Pt states she was seen at [facility] for same symptoms and was diagnosed with GI virus. She was instructed to alter her diet to bread. Pt is taking biaxin for sinus last week. Pt states she had sick contact with her fiance and his son (same symptoms).  Pt denies fever, chills, vomiting, weakness, cough, SOB and any other pain.    Past Medical History  Diagnosis Date  . Hypertension   . Irregular heart beat   . Anxiety   . Depression   . Diabetes mellitus     takes only the pill  . Headache     occiasional migraine  . Breast cancer     right    Past Surgical History  Procedure Laterality Date  . Abdominal hysterectomy    . Cholecystectomy    . Breast surgery      biopsy then wide excision   . Sentinel lymph node biopsy      Family History  Problem Relation Age of Onset  . Diabetes Father   . Stroke Maternal Grandmother     History  Substance Use Topics  . Smoking status: Former Research scientist (life sciences)  . Smokeless tobacco: Never Used  . Alcohol Use: No    OB History   Grav Para Term Preterm Abortions TAB SAB Ect Mult Living                  Review of Systems A complete 10 system review of systems was obtained and all  systems are negative except as noted in the HPI and PMH.   Allergies  Codeine; Penicillins; Sulfonamide derivatives; and Iodine  Home Medications   Current Outpatient Rx  Name  Route  Sig  Dispense  Refill  . albuterol (PROVENTIL HFA;VENTOLIN HFA) 108 (90 BASE) MCG/ACT inhaler   Inhalation   Inhale 2 puffs into the lungs every 6 (six) hours as needed.         Marland Kitchen anastrozole (ARIMIDEX) 1 MG tablet   Oral   Take 1 mg by mouth daily.         . citalopram (CELEXA) 20 MG tablet   Oral   Take 40 mg by mouth daily.          Marland Kitchen glimepiride (AMARYL) 4 MG tablet   Oral   Take 4 mg by mouth daily before breakfast.         . loperamide (IMODIUM) 2 MG capsule   Oral   Take 2 mg by mouth 4 (four) times daily as needed for diarrhea or loose stools.         Marland Kitchen losartan (COZAAR) 50 MG tablet   Oral   Take 50 mg by mouth daily.         Marland Kitchen  metoprolol (LOPRESSOR) 50 MG tablet   Oral   Take 1 tablet (50 mg total) by mouth 2 (two) times daily.   90 tablet   1     Pt need to call office for appointment for future  ...   . Naproxen Sodium (ALEVE PO)   Oral   Take 200 mg by mouth as needed (pain).          . ondansetron (ZOFRAN) 4 MG tablet   Oral   Take 4 mg by mouth every 6 (six) hours as needed for nausea.         Marland Kitchen zolpidem (AMBIEN) 10 MG tablet   Oral   Take 10 mg by mouth at bedtime.           BP 108/65  Pulse 82  Temp(Src) 97.8 F (36.6 C) (Oral)  Resp 18  Ht 5\' 4"  (1.626 m)  Wt 192 lb (87.091 kg)  BMI 32.94 kg/m2  SpO2 100%  Physical Exam  Nursing note and vitals reviewed. Constitutional: She is oriented to person, place, and time. She appears well-developed and well-nourished.  Obese and slightly dehydrated  HENT:  Head: Normocephalic and atraumatic.  Eyes: Conjunctivae and EOM are normal. Pupils are equal, round, and reactive to light.  Neck: Normal range of motion. Neck supple.  Cardiovascular: Normal rate, regular rhythm and normal heart  sounds.   Pulmonary/Chest: Effort normal and breath sounds normal.  Abdominal: Soft. Bowel sounds are normal.  minimal diffuse abdominal tenderness   Musculoskeletal: Normal range of motion.  Neurological: She is alert and oriented to person, place, and time.  Skin: Skin is warm and dry.  Psychiatric: She has a normal mood and affect. Her behavior is normal.    ED Course  Procedures (including critical care time) DIAGNOSTIC STUDIES: Oxygen Saturation is 100% on room air, normal by my interpretation.    COORDINATION OF CARE:  9:17 AM Discussed ED treatment with pt and pt agrees to treatment plan of IV fluids and prescribed pain medication. A stool sample will be taken for c.diff.    Labs Reviewed  CBC WITH DIFFERENTIAL - Abnormal; Notable for the following:    WBC 12.2 (*)    Hemoglobin 15.1 (*)    MCHC 37.3 (*)    Neutro Abs 9.5 (*)    All other components within normal limits  COMPREHENSIVE METABOLIC PANEL - Abnormal; Notable for the following:    Sodium 128 (*)    Potassium 2.6 (*)    Chloride 90 (*)    CO2 15 (*)    Glucose, Bld 189 (*)    BUN 73 (*)    Creatinine, Ser 7.78 (*)    Alkaline Phosphatase 137 (*)    GFR calc non Af Amer 5 (*)    GFR calc Af Amer 6 (*)    All other components within normal limits  CLOSTRIDIUM DIFFICILE BY PCR  STOOL CULTURE  OVA AND PARASITE EXAMINATION   No results found.   No diagnosis found.   Date: 04/04/2013  Rate: 74  Rhythm: normal sinus rhythm  QRS Axis: normal  Intervals: normal  ST/T Wave abnormalities: normal  Conduction Disutrbances:none  Narrative Interpretation:   Old EKG Reviewed: changes noted With sinus arrhythmia and frequent PVCs.  LVH    CRITICAL CARE Performed by: Nat Christen Total critical care time: 30 Critical care time was exclusive of separately billable procedures and treating other patients. Critical care was necessary to treat or prevent imminent or life-threatening deterioration. Critical  care was time spent personally by me on the following activities: development of treatment plan with patient and/or surrogate as well as nursing, discussions with consultants, evaluation of patient's response to treatment, examination of patient, obtaining history from patient or surrogate, ordering and performing treatments and interventions, ordering and review of laboratory studies, ordering and review of radiographic studies, pulse oximetry and re-evaluation of patient's condition. MDM  New onset renal failure with creatinine 7.78.     Potassium also low at 2.6      vigorously hydrate, replace potassium intravenously, admit to hospitalist      I personally performed the services described in this documentation, which was scribed in my presence. The recorded information has been reviewed and is accurate.    Nat Christen, MD 04/04/13 1057

## 2013-04-04 NOTE — Progress Notes (Signed)
Pt states she takes Ambien PRN at bedtime.  Did not see on PRN med list.  Dr. Anastasio Champion notified via text page.

## 2013-04-04 NOTE — ED Notes (Signed)
Pt reports nausea and diarreha since last Wednesday, was seen on Friday by pmd and told virus

## 2013-04-05 DIAGNOSIS — N390 Urinary tract infection, site not specified: Secondary | ICD-10-CM

## 2013-04-05 HISTORY — DX: Urinary tract infection, site not specified: N39.0

## 2013-04-05 LAB — BASIC METABOLIC PANEL
Calcium: 7.8 mg/dL — ABNORMAL LOW (ref 8.4–10.5)
Chloride: 107 mEq/L (ref 96–112)
Creatinine, Ser: 3.82 mg/dL — ABNORMAL HIGH (ref 0.50–1.10)
GFR calc Af Amer: 10 mL/min — ABNORMAL LOW (ref >90)
GFR calc Af Amer: 14 mL/min — ABNORMAL LOW (ref >90)
GFR calc non Af Amer: 8 mL/min — ABNORMAL LOW (ref >90)
Potassium: 2.9 mEq/L — ABNORMAL LOW (ref 3.5–5.1)
Potassium: 3.6 mEq/L (ref 3.5–5.1)
Sodium: 133 mEq/L — ABNORMAL LOW (ref 135–145)

## 2013-04-05 LAB — OVA AND PARASITE EXAMINATION: Ova and parasites: NONE SEEN

## 2013-04-05 LAB — GLUCOSE, CAPILLARY
Glucose-Capillary: 199 mg/dL — ABNORMAL HIGH (ref 70–99)
Glucose-Capillary: 166 mg/dL — ABNORMAL HIGH (ref 70–99)

## 2013-04-05 LAB — CBC
Hemoglobin: 12 g/dL (ref 12.0–15.0)
Platelets: 190 10*3/uL (ref 150–400)
RBC: 3.97 MIL/uL (ref 3.87–5.11)
WBC: 8.1 10*3/uL (ref 4.0–10.5)

## 2013-04-05 MED ORDER — INSULIN ASPART 100 UNIT/ML ~~LOC~~ SOLN: 0.0000 [IU] | Freq: Every day | SUBCUTANEOUS | Status: DC

## 2013-04-05 MED ORDER — KCL IN DEXTROSE-NACL 40-5-0.9 MEQ/L-%-% IV SOLN
INTRAVENOUS | Status: DC
  Administered 2013-04-05 – 2013-04-06 (×2): via INTRAVENOUS
  Filled 2013-04-05 (×7): qty 1000

## 2013-04-05 MED ORDER — POTASSIUM CHLORIDE 10 MEQ/100ML IV SOLN
10.0000 meq | INTRAVENOUS | Status: AC
  Administered 2013-04-05 (×6): 10 meq via INTRAVENOUS
  Filled 2013-04-05: qty 100
  Filled 2013-04-05: qty 600

## 2013-04-05 MED ORDER — INSULIN ASPART 100 UNIT/ML ~~LOC~~ SOLN
0.0000 [IU] | Freq: Three times a day (TID) | SUBCUTANEOUS | Status: DC
  Administered 2013-04-05: 7 [IU] via SUBCUTANEOUS
  Administered 2013-04-06: 4 [IU] via SUBCUTANEOUS

## 2013-04-05 MED ORDER — ALUM & MAG HYDROXIDE-SIMETH 200-200-20 MG/5ML PO SUSP
30.0000 mL | Freq: Four times a day (QID) | ORAL | Status: DC | PRN
  Administered 2013-04-05: 30 mL via ORAL
  Filled 2013-04-05: qty 30

## 2013-04-05 NOTE — Care Management Note (Signed)
    Page 1 of 1   04/06/2013     10:15:25 AM   CARE MANAGEMENT NOTE 04/06/2013  Patient:  Kristen Garner, Kristen Garner   Account Number:  [id number]  Date Initiated:  04/05/2013  Documentation initiated by:  Theophilus Kinds  Subjective/Objective Assessment:   Pt admitted from home with acute renal failure. Pt has a fiance who has moved in with pt and is available to assist pt with ADL's if needed. Pt is currently independent with ADL's.     Action/Plan:   NO CM needs noted.   Anticipated DC Date:  04/07/2013   Anticipated DC Plan:  [place]  CM consult      Choice offered to / List presented to:             Status of service:  Completed, signed off Medicare Important Message given?   (If response is "NO", the following Medicare IM given date fields will be blank) Date Medicare IM given:   Date Additional Medicare IM given:    Discharge Disposition:  HOME/SELF CARE  Per UR Regulation:    If discussed at Long Length of Stay Meetings, dates discussed:    Comments:  04/06/13 [place], RN BSN CM Pt discharged home today. No CM needs noted.  04/05/13 [place], RN BSN CM

## 2013-04-05 NOTE — Plan of Care (Signed)
Problem: Phase II Progression Outcomes Goal: Tolerating diet Outcome: Completed/Met Date Met:  04/05/13 Advanced to carb modified.  Tolerated well.  No N/V.

## 2013-04-05 NOTE — Progress Notes (Signed)
[facility] PROGRESS NOTE  TAYCEE SNELLING [id number] DOB: Jan 24, 1951 DOA: 04/04/2013 PCP: Purvis Kilts, MD  Assessment/Plan: Acute renal failure: likely pre-renal as a result of diarrhea. Improving. Will continue to  hydrate with IV fluids. Hold any nephrotoxins. Monitor strict intake and output. BMET in am  Active Problems:  Diarrhea: etiology uncertain. cdiff neg. Stool culture and O&P pending. Improved. Only 1 stool during night.  Will discontinue flagyl.   Dehydration: related to #2 and anorexia. Improved.  Will continue to  hydrate with IV fluid. Will change fluid to address electrolytes. Output  700.    Hypokalemia: related to #2. Only slight improvement. Will replete and recheck.    Hyponatremia: related to #2. Improving. IV fluids.   Metabolic acidosis: as a result of #2 and #1. Slightly worse. Will change  IV fluids to D5 NS with K. Will recheck bmet at 6pm and follow.   Leukocytosis: resolved.    Diabetes: uncontrolled.  HgA1c 8.3. Will advance diet. Continue SSI.  Hold amaryl,.   HTN (hypertension): controlled. Continue to  hold lopressor and cozaar. monitor  ANXIETY DEPRESSION: stable at baseline   Infiltrating ductal carcinoma of breast: takes arimidex. Stable.  none    UTI:   Code Status: full Family Communication: significant other at bedside Disposition Plan: home in day or 2   Consultants:  none  Procedures:  none  Antibiotics:  Flagyl 04/04/13-04/05/13  HPI/Subjective: Awake alert. Reports feeling better. Requesting food. Denies nausea/diarrhea  Objective: Filed Vitals:   04/04/13 1441 04/04/13 1911 04/04/13 2014 04/05/13 0445  BP: 124/72  130/66 126/70  Pulse: 71  79 69  Temp: 97.7 F (36.5 C)  98.3 F (36.8 C) 97.9 F (36.6 C)  TempSrc: Oral  Oral Oral  Resp: 16  18 17   Height:      Weight:      SpO2: 98% 98% 98% 96%    Intake/Output Summary (Last 24 hours) at 04/05/13 0856 Last data filed at 04/05/13 0640  Gross per 24 hour  Intake      0 ml  Output    700 ml  Net   -700 ml   Filed Weights   04/04/13 0818  Weight: 87.091 kg (192 lb)    Exam:   General:  Obese NAD  Cardiovascular: irregular no MGR No LE edema  Respiratory: normal effort BS clear bilaterally no wheeze no rhonchi  Abdomen: obese soft +BS non-tender to palpation  Musculoskeletal: no clubbing no cyanosis MAE   Data Reviewed: Basic Metabolic Panel:  Recent Labs Lab 04/04/13 0828 04/04/13 1524 04/05/13 0455  NA 128* 131* 133*  K 2.6* 2.9* 2.9*  CL 90* 97 104  CO2 15* 14* 14*  GLUCOSE 189* 182* 134*  BUN 73* 69* 63*  CREATININE 7.78* 6.52* 5.09*  CALCIUM 8.9 7.9* 7.8*   Liver Function Tests:  Recent Labs Lab 04/04/13 0828  AST 11  ALT 14  ALKPHOS 137*  BILITOT 0.3  PROT 8.0  ALBUMIN 4.2   No results found for this basename: LIPASE, AMYLASE,  in the last 168 hours No results found for this basename: AMMONIA,  in the last 168 hours CBC:  Recent Labs Lab 04/04/13 0828 04/05/13 0455  WBC 12.2* 8.1  NEUTROABS 9.5*  --   HGB 15.1* 12.0  HCT 40.5 33.0*  MCV 83.0 83.1  PLT 267 190   Cardiac Enzymes: No results found for this basename: CKTOTAL, CKMB, CKMBINDEX, TROPONINI,  in the last 168 hours BNP (last 3 results)  No results found for this basename: PROBNP,  in the last 8760 hours CBG:  Recent Labs Lab 04/04/13 1224 04/04/13 1642 04/04/13 2138 04/05/13 0742  GLUCAP 184* 159* 132* 133*    Recent Results (from the past 240 hour(s))  CLOSTRIDIUM DIFFICILE BY PCR     Status: None   Collection Time    04/04/13  9:17 AM      Result Value Range Status   C difficile by pcr NEGATIVE  NEGATIVE Final     Studies: No results found.  Scheduled Meds: . anastrozole  1 mg Oral Daily  . citalopram  40 mg Oral Daily  . heparin  5,000 Units Subcutaneous Q8H  . insulin aspart  0-9 Units Subcutaneous TID WC  . potassium chloride  10 mEq Intravenous Q1 Hr x 6  . sodium chloride  3 mL  Intravenous Q12H   Continuous Infusions: . dextrose 5 % and 0.9 % NaCl with KCl 40 mEq/L      Principal Problem:   Acute renal failure Active Problems:   ANXIETY DEPRESSION   Infiltrating ductal carcinoma of breast   Diarrhea   Dehydration   Hypokalemia   Hyponatremia   Metabolic acidosis   Leucocytosis   Diabetes   HTN (hypertension)    Time spent: 30 minutes    [facility] Pager [phone] If 7PM-7AM, please contact night-coverage at [email or web address], password [facility] 04/05/2013, 8:56 AM  LOS: 1 day   Attending: Patient seen and examined, above of note reviewed. She is clearly improving from her likely viral gastroenteritis. C. difficile toxin by PCR is negative. Metronidazole has been discontinued. Her diabetes is clearly uncontrolled. Continue with IV fluids.

## 2013-04-05 NOTE — Plan of Care (Signed)
Problem: Phase I Progression Outcomes Goal: OOB as tolerated unless otherwise ordered Outcome: Completed/Met Date Met:  04/05/13 Pt up to chair during the day.  Tolerated well.

## 2013-04-06 LAB — BASIC METABOLIC PANEL
BUN: 46 mg/dL — ABNORMAL HIGH (ref 6–23)
CO2: 14 mEq/L — ABNORMAL LOW (ref 19–32)
Calcium: 8.6 mg/dL (ref 8.4–10.5)
Creatinine, Ser: 2.32 mg/dL — ABNORMAL HIGH (ref 0.50–1.10)
Glucose, Bld: 216 mg/dL — ABNORMAL HIGH (ref 70–99)

## 2013-04-06 LAB — URINE CULTURE

## 2013-04-06 MED ORDER — SODIUM BICARBONATE 650 MG PO TABS: 1300.0000 mg | ORAL_TABLET | Freq: Three times a day (TID) | ORAL | Status: DC

## 2013-04-06 NOTE — Progress Notes (Signed)
  RD consulted for nutrition education regarding diabetes.   Lab Results  Component Value Date   HGBA1C 8.2* 04/04/2013    RD provided "Carbohydrate Counting for People with Diabetes" handout from the Academy of [employer] and Dietetics. Discussed different food groups and their effects on blood sugar, emphasizing carbohydrate-containing foods. Provided list of carbohydrates and recommended serving sizes of common foods.  Discussed importance of controlled and consistent carbohydrate intake throughout the day. Provided examples of ways to balance meals/snacks and encouraged intake of high-fiber, whole grain complex carbohydrates. Teach back method used.  Expect good compliance.  Body mass index is 32.94 kg/(m^2). Pt meets criteria for Obesity Class I based on current BMI.  Current diet order is CHO Modified 1600-2000 kcal , patient is consuming approximately 75% of meals at this time. Labs and medications reviewed. No further nutrition interventions warranted at this time. RD contact information provided. If additional nutrition issues arise, please re-consult RD.  Colman Cater MS,RD,LDN,CSG Office: [phone] Pager: [phone]

## 2013-04-06 NOTE — Progress Notes (Signed)
IV removed, site WNL.  Pt given d/c instructions and new prescriptions.  Discussed home care with patient and discussed home medications, patient verbalizes understanding, teachback completed. F/U appointment in place, pt states they will keep appointment. Pt is stable at this time. Pt taken to main entrance in wheelchair by staff member.

## 2013-04-06 NOTE — Discharge Summary (Signed)
Physician Discharge Summary  Kristen Garner DOB: Dec 15, 1950 DOA: 04/04/2013  PCP: Purvis Kilts, MD  Admit date: 04/04/2013 Discharge date: 04/06/2013  Time spent: 40 minutes  Recommendations for Outpatient Follow-up:  1 has appointment with [name] 04/15/13. Recommend bmet for trending renal function. Discharge Diagnoses:  Principal Problem:   Acute renal failure Active Problems:   ANXIETY DEPRESSION   Infiltrating ductal carcinoma of breast   Diarrhea   Dehydration   Hypokalemia   Hyponatremia   Metabolic acidosis   Leucocytosis   Diabetes   HTN (hypertension)   UTI (urinary tract infection)   Discharge Condition: stable  Diet recommendation: carb modified  Filed Weights   04/04/13 0818  Weight: 87.091 kg (192 lb)    History of present illness:  Kristen Garner is a pleasant 62 y.o. female with past medical hx that includes breast ca s/p lumpectomy 2013 and currently taking arimidex, HTN, DM oral agents, depression, irregular heart rhythm presented to ED on 04/04/13 with CC 6 days persistent diarrhea, nausea and vomiting. Information obtained from pt and significant other who is at bedside. Pt stated developed diarrhea, n/v 6 days prior. 4 days prior went to PCP dx with "virus" and recommended immodium and bland diet. Pt stated she took imodium day before presentation and # of stools has decreased. Stated vomiting resolved 4 days prior but remains nauseated. Had been unable to tolerate food or fluids for 5 days. Stated she tried to drink pedialyte but was unable. Denied abdominal pain, dysuria, hematuria, headache, sob, cp, dizziness. Reportd feeling very weak. Stated prior to this she had been given antibiotics for sinus infection. In addition, she had recent sick contacts with same symptoms. She reportd having at least 10 watery brown stools per day. Denied dark stool or bloody stool. Symptoms came on suddenly have persisted and characterized as  severe. Work up in ED yields creatinine 7.78, C02 15, potassium 2.5, sodium 128. TRH asked to admit.    Hospital Course:  Acute renal failure: likely pre-renal as a result of diarrhea. Admitted and provided with aggressive IV hydration. Creatine quickly trended downward. At discharge creatinine 2.3 down from 7.7 on admission. Recommend close OP follow up.  Active Problems:  Diarrhea: etiology uncertain. cdiff neg. Stool culture and O&P neg. Quickly resolved. No diarrhea stool during this hospitalization.    Dehydration: related to #2 and anorexia. Responded positively to IV fluids. At discharge tolerating carb modified diet without problem.   Hypokalemia: related to #2. Resolved at discharge   Hyponatremia: related to #2. Resolved at discharge   Metabolic acidosis: as a result of #2 and #1. Supported with IV fluids. At discharge CO2 remained somewhat low but pt asymptomatic. Supplements started at discharge. Recommend bmet at follow up with PCP   Leukocytosis: resolved.   Diabetes: uncontrolled. HgA1c 8.3. Resume amaryl at discharge. Pt seen by diabetic coordinator. Scheduled for OP follow up with diabetes education. Instructed to monitor CBG daily and provide PCP with data for evaluation.    HTN (hypertension): controlled. Initially held lopressor and cozaar. At discharge resume lopressor but discontinued cozaar due to #1. Recommend follow up with PCP for evaluation BP.   ANXIETY DEPRESSION: stable at baseline   Infiltrating ductal carcinoma of breast: takes arimidex. Stable.  none     Procedures: noneConsultations:  none  Discharge Exam: Filed Vitals:   04/05/13 0445 04/05/13 1343 04/05/13 2128 04/06/13 0418  BP: 126/70 107/67 152/68 149/66  Pulse: 69 75 81 97  Temp:  97.9 F (36.6 C) 97.7 F (36.5 C) 97.6 F (36.4 C) 97.7 F (36.5 C)  TempSrc: Oral Oral Oral Oral  Resp: 17 18 17 18   Height:      Weight:      SpO2: 96% 99% 99% 97%    General: well nourished,  NAD Cardiovascular: RRR No MGR No LE edema Respiratory: normal effort BS clear bilaterally  Discharge Instructions      Discharge Orders   Future Appointments Provider Department Dept Phone   04/12/2013 1:30 PM [facility] Provider [place] [phone]   05/18/2013 8:00 AM [facility] [phone]   Patient should wear two piece clothing and wear no powder or deodorant. Patient should arrive 15 minutes early.   Future Orders Complete By Expires     Ambulatory referral to Nutrition and Diabetic Education  As directed     Comments:      Diabetes uncontrolled; A1C 8.2%; needs diabetes diet education    Call MD for:  difficulty breathing, headache or visual disturbances  As directed     Call MD for:  persistant nausea and vomiting  As directed     Diet - low sodium heart healthy  As directed     Diet Carb Modified  As directed     Diet Carb Modified  As directed     Increase activity slowly  As directed     Increase activity slowly  As directed         Medication List    STOP taking these medications       ALEVE PO     loperamide 2 MG capsule  Commonly known as:  IMODIUM     losartan 50 MG tablet  Commonly known as:  COZAAR      TAKE these medications       albuterol 108 (90 BASE) MCG/ACT inhaler  Commonly known as:  PROVENTIL HFA;VENTOLIN HFA  Inhale 2 puffs into the lungs every 6 (six) hours as needed.     anastrozole 1 MG tablet  Commonly known as:  ARIMIDEX  Take 1 mg by mouth daily.     citalopram 20 MG tablet  Commonly known as:  CELEXA  Take 40 mg by mouth daily.     glimepiride 4 MG tablet  Commonly known as:  AMARYL  Take 4 mg by mouth daily before breakfast.     metoprolol 50 MG tablet  Commonly known as:  LOPRESSOR  Take 1 tablet (50 mg total) by mouth 2 (two) times daily.     ondansetron 4 MG tablet  Commonly known as:  ZOFRAN  Take 4 mg by mouth every 6 (six) hours as needed for nausea.     sodium  bicarbonate 650 MG tablet  Take 2 tablets (1,300 mg total) by mouth 3 (three) times daily.     zolpidem 10 MG tablet  Commonly known as:  AMBIEN  Take 10 mg by mouth at bedtime.       Allergies  Allergen Reactions  . Codeine Itching  . Penicillins Other (See Comments)    Child hood allergy  . Sulfonamide Derivatives Itching  . Iodine Rash   Follow-up Information   Follow up with Purvis Kilts, MD. (Pt has appointment 04/15/13 at 11:45am. Follow up on symptoms, will need bmet to eval renal function. )    Contact information:   [id number] [place]  [address] [phone]  The results of significant diagnostics from this hospitalization (including imaging, microbiology, ancillary and laboratory) are listed below for reference.    Significant Diagnostic Studies: No results found.  Microbiology: Recent Results (from the past 240 hour(s))  CLOSTRIDIUM DIFFICILE BY PCR     Status: None   Collection Time    04/04/13  9:17 AM      Result Value Range Status   C difficile by pcr NEGATIVE  NEGATIVE Final  STOOL CULTURE     Status: None   Collection Time    04/04/13  9:17 AM      Result Value Range Status   Specimen Description STOOL   Final   Special Requests NONE   Final   Culture NO SUSPICIOUS COLONIES, CONTINUING TO HOLD   Final   Report Status PENDING   Incomplete  OVA AND PARASITE EXAMINATION     Status: None   Collection Time    04/04/13  9:17 AM      Result Value Range Status   Specimen Description STOOL   Final   Special Requests NONE   Final   Ova and parasites NO OVA OR PARASITES SEEN   Final   Report Status 04/05/2013 FINAL   Final  URINE CULTURE     Status: None   Collection Time    04/04/13 10:38 PM      Result Value Range Status   Specimen Description URINE, CLEAN CATCH   Final   Special Requests NONE   Final   Culture  Setup Time 04/05/2013 16:37   Final   Colony Count >=100,000 COLONIES/ML   Final   Culture  KLEBSIELLA PNEUMONIAE   Final   Report Status 04/06/2013 FINAL   Final   Organism ID, Bacteria KLEBSIELLA PNEUMONIAE   Final     Labs: Basic Metabolic Panel:  Recent Labs Lab 04/04/13 0828 04/04/13 1524 04/05/13 0455 04/05/13 1548 04/06/13 0520  NA 128* 131* 133* 136 139  K 2.6* 2.9* 2.9* 3.6 4.0  CL 90* 97 104 107 113*  CO2 15* 14* 14* 13* 14*  GLUCOSE 189* 182* 134* 130* 216*  BUN 73* 69* 63* 57* 46*  CREATININE 7.78* 6.52* 5.09* 3.82* 2.32*  CALCIUM 8.9 7.9* 7.8* 8.3* 8.6   Liver Function Tests:  Recent Labs Lab 04/04/13 0828  AST 11  ALT 14  ALKPHOS 137*  BILITOT 0.3  PROT 8.0  ALBUMIN 4.2   No results found for this basename: LIPASE, AMYLASE,  in the last 168 hours No results found for this basename: AMMONIA,  in the last 168 hours CBC:  Recent Labs Lab 04/04/13 0828 04/05/13 0455  WBC 12.2* 8.1  NEUTROABS 9.5*  --   HGB 15.1* 12.0  HCT 40.5 33.0*  MCV 83.0 83.1  PLT 267 190   Cardiac Enzymes: No results found for this basename: CKTOTAL, CKMB, CKMBINDEX, TROPONINI,  in the last 168 hours BNP: BNP (last 3 results) No results found for this basename: PROBNP,  in the last 8760 hours CBG:  Recent Labs Lab 04/05/13 0742 04/05/13 0921 04/05/13 1622 04/05/13 2128 04/06/13 0729  GLUCAP 133* 199* 111* 166* 183*       Signed:  BLACK,KAREN M  [facility] 04/06/2013, 4:13 PM  Attending note: Patient seen and examined, and discussed with Dyanne Carrel, NP.  She was admitted for vomiting, diarrhea, severe dehydration and acute renal failure. She was also taking olmesartan as well as nonsteroidal anti-inflammatory medications. Her renal function has dramatically improved with IV fluids. She's  continued to improve despite IV fluids being discontinued. Patient has been encouraged to continue by mouth fluids. She's been started on oral bicarbonate supplementation since she likely had GI losses leading to low bicarbonate. Her GI illness has  resolved. She will need a followup basic metabolic panel when she sees her primary care doctor. She's otherwise feeling significantly improved and is ready for discharge home.

## 2013-04-06 NOTE — Progress Notes (Signed)
Inpatient Diabetes Program Recommendations  AACE/ADA: New Consensus Statement on Inpatient Glycemic Control (2013)  Target Ranges:  Prepandial:   less than 140 mg/dL      Peak postprandial:   less than 180 mg/dL (1-2 hours)      Critically ill patients:  140 - 180 mg/dL    Note: Noted consult for diabetes diet education.  Placed order for consult to dietitian to see patient before discharged.  Also, placed outpatient order for patient to follow up with outpatient diabetes education ([facility]) to assist patient with diabetes control.  Patient will be called by [facility] to set up an appointment.    Thanks, Barnie Alderman, RN, BSN, [place] Diabetes Coordinator Inpatient Diabetes Program [phone]

## 2013-04-08 LAB — STOOL CULTURE

## 2013-04-12 ENCOUNTER — Encounter (HOSPITAL_COMMUNITY): Payer: Medicaid Other | Attending: Hematology and Oncology

## 2013-04-12 VITALS — BP 177/84 | HR 80 | Temp 98.6°F | Resp 18

## 2013-04-12 DIAGNOSIS — C50419 Malignant neoplasm of upper-outer quadrant of unspecified female breast: Secondary | ICD-10-CM

## 2013-04-12 DIAGNOSIS — Z170 Estrogen receptor positive status [ER+]: Secondary | ICD-10-CM

## 2013-04-12 DIAGNOSIS — C50911 Malignant neoplasm of unspecified site of right female breast: Secondary | ICD-10-CM

## 2013-04-12 DIAGNOSIS — Z780 Asymptomatic menopausal state: Secondary | ICD-10-CM

## 2013-04-12 NOTE — Progress Notes (Signed)
[facility] [phone]   Fax:([phone]  OFFICE PROGRESS NOTE  Purvis Kilts, MD [id number] [place] [address]  DIAGNOSIS:breast cancer.  INTERVAL HISTORY: Kristen Garner 62 y.o. female returns to the clinic today for stage II T2 N0 MX infiltrating ductal carcinoma of the right breast ER/PR positive HER-2/neu negative.According to our records she was initiated to have DCIS on biopsy done 04/28/2012.It appears that lumpectomy specimen she would invasive ductal carcinoma. Patient now was reportedly treated with irradiation and started   Arimidex on 10/06/2012. She's been tolerating this very well. She denies significant hot flashes. She denies shortness of breath. She denies arthralgias and has no new breast lumps. She is eating well. She has a mammogram scheduled in June reportedly. She denies having any DEXA scan her knowledge.  She was recently hospitalized for an acute kidney injury related to dehydration. She states that she is recovering well and diarrhea,nausea and vomiting has resolved.  MEDICAL HISTORY: Past Medical History  Diagnosis Date  . Hypertension   . Irregular heart beat   . Anxiety   . Depression   . Diabetes mellitus     takes only the pill  . Headache     occiasional migraine  . Breast cancer     right    ALLERGIES:  is allergic to codeine; penicillins; sulfonamide derivatives; and iodine.  MEDICATIONS:  Current Outpatient Prescriptions  Medication Sig Dispense Refill  . anastrozole (ARIMIDEX) 1 MG tablet Take 1 mg by mouth daily.      Marland Kitchen glimepiride (AMARYL) 4 MG tablet Take 4 mg by mouth daily before breakfast.      . zolpidem (AMBIEN) 10 MG tablet Take 10 mg by mouth at bedtime.      Marland Kitchen albuterol (PROVENTIL HFA;VENTOLIN HFA) 108 (90 BASE) MCG/ACT inhaler Inhale 2 puffs into the lungs every 6 (six) hours as needed.      . citalopram (CELEXA) 20 MG tablet Take 40 mg by mouth daily.        . metoprolol (LOPRESSOR) 50 MG tablet Take 1 tablet (50 mg total) by mouth 2 (two) times daily.  90 tablet  1  . ondansetron (ZOFRAN) 4 MG tablet Take 4 mg by mouth every 6 (six) hours as needed for nausea.      . sodium bicarbonate 650 MG tablet Take 2 tablets (1,300 mg total) by mouth 3 (three) times daily.  60 tablet  0   No current facility-administered medications for this visit.    SURGICAL HISTORY:  Past Surgical History  Procedure Laterality Date  . Abdominal hysterectomy    . Cholecystectomy    . Breast surgery      biopsy then wide excision   . Sentinel lymph node biopsy      REVIEW OF SYSTEM: 14 point review of system is as in the history above otherwise negative.  PHYSICAL EXAMINATION: GENERAL: No distress, well nourished.  LYMPH: No palpable lymphadenopathy in the neck,axilla or supraclavicular areas.  BREAST:Normal without mass,well healed slightly tender scars on the right breast LUNGS: clear to auscultation , no crackles or wheezes HEART:  S1 normal and S2 normal  ABDOMEN: Abdomen soft, non-tender, normal bowel sounds, no masses or organomegaly and no hepatosplenomegaly  MSK: No CVA tenderness and no tenderness on percussion of the back or rib cage. EXTREMITIES: No edema, no skin discoloration or tenderness NEURO: alert & oriented , no focal motor/sensory deficits.  Blood pressure 177/84, pulse 80, temperature 98.6 F (37 C), temperature source Oral, resp. rate 18.  LABORATORY DATA: Lab Results  Component Value Date   WBC 8.1 04/05/2013   HGB 12.0 04/05/2013   HCT 33.0* 04/05/2013   MCV 83.1 04/05/2013   PLT 190 04/05/2013      Chemistry      Component Value Date/Time   NA 139 04/06/2013 0520   K 4.0 04/06/2013 0520   CL 113* 04/06/2013 0520   CO2 14* 04/06/2013 0520   BUN 46* 04/06/2013 0520   CREATININE 2.32* 04/06/2013 0520      Component Value Date/Time   CALCIUM 8.6 04/06/2013 0520   ALKPHOS 137* 04/04/2013 0828   AST 11 04/04/2013 0828     ALT 14 04/04/2013 0828   BILITOT 0.3 04/04/2013 0828       RADIOGRAPHIC STUDIES: No results found.  ASSESSMENT:Stage II breast cancer treated breast conservation. She has no evidence of disease at present.   PLAN:  1. Continue Arimidex patient will be due for 5 years in November 2018 2.I ordered a  DEXA scan since patient has not had a baseline scan. AIs are know for bone loss. 3.Return to clinic in 6 months. 4.Mammogran as ordered.     All questions were answered. Total visit time was 30 mins with more that 50% spent face to face on counseling and coordination of care.  Verlan Friends, MD FACP. Hematology/Oncology.

## 2013-04-12 NOTE — Patient Instructions (Signed)
[facility] Discharge Instructions  RECOMMENDATIONS MADE BY THE CONSULTANT AND ANY TEST RESULTS WILL BE SENT TO YOUR REFERRING PHYSICIAN.  EXAM FINDINGS BY THE PHYSICIAN TODAY AND SIGNS OR SYMPTOMS TO REPORT TO CLINIC OR PRIMARY PHYSICIAN: Your exam today was normal.   INSTRUCTIONS GIVEN AND DISCUSSED: Please be sure to have your mammogram done as scheduled.  We will also schedule you a Bone Density test, please see the front desk for that appointment as you leave today.    SPECIAL INSTRUCTIONS/FOLLOW-UP: Please return to see [place] in 6 months, your appointment will be made at the front desk as you leave today.  Please call us with any concerns or needs if needed before your next scheduled appointment.    Thank you for choosing [facility] to provide your oncology and hematology care.  To afford each patient quality time with our providers, please arrive at least 15 minutes before your scheduled appointment time.  With your help, our goal is to use those 15 minutes to complete the necessary work-up to ensure our physicians have the information they need to help with your evaluation and healthcare recommendations.    Effective January 1st, 2014, we ask that you re-schedule your appointment with our physicians should you arrive 10 or more minutes late for your appointment.  We strive to give you quality time with our providers, and arriving late affects you and other patients whose appointments are after yours.    Again, thank you for choosing [facility].  Our hope is that these requests will decrease the amount of time that you wait before being seen by our physicians.       _____________________________________________________________  Should you have questions after your visit to [facility], please contact our office at [phone] between the hours of 8:30 a.m. and 5:00 p.m.  Voicemails left after 4:30 p.m. will not be returned  until the following business day.  For prescription refill requests, have your pharmacy contact our office with your prescription refill request.

## 2013-04-15 ENCOUNTER — Ambulatory Visit (HOSPITAL_COMMUNITY)
Admission: RE | Admit: 2013-04-15 | Discharge: 2013-04-15 | Disposition: A | Discharge status: Home / Self Care | Payer: Medicaid Other | Source: Ambulatory Visit | Attending: Hematology and Oncology | Admitting: Hematology and Oncology

## 2013-04-15 DIAGNOSIS — Z780 Asymptomatic menopausal state: Secondary | ICD-10-CM | POA: Insufficient documentation

## 2013-04-15 DIAGNOSIS — Z853 Personal history of malignant neoplasm of breast: Secondary | ICD-10-CM | POA: Insufficient documentation

## 2013-04-15 DIAGNOSIS — M899 Disorder of bone, unspecified: Secondary | ICD-10-CM | POA: Insufficient documentation

## 2013-04-15 DIAGNOSIS — Z13820 Encounter for screening for osteoporosis: Secondary | ICD-10-CM | POA: Insufficient documentation

## 2013-04-15 DIAGNOSIS — Z79899 Other long term (current) drug therapy: Secondary | ICD-10-CM | POA: Insufficient documentation

## 2013-05-17 ENCOUNTER — Other Ambulatory Visit (HOSPITAL_COMMUNITY): Payer: Self-pay | Admitting: Oncology

## 2013-05-17 DIAGNOSIS — C50911 Malignant neoplasm of unspecified site of right female breast: Secondary | ICD-10-CM

## 2013-05-18 ENCOUNTER — Encounter (HOSPITAL_COMMUNITY): Payer: Medicaid Other

## 2013-05-25 ENCOUNTER — Ambulatory Visit (HOSPITAL_COMMUNITY)
Admission: RE | Admit: 2013-05-25 | Discharge: 2013-05-25 | Disposition: A | Discharge status: Home / Self Care | Payer: Self-pay | Source: Ambulatory Visit | Attending: Oncology | Admitting: Oncology

## 2013-05-25 DIAGNOSIS — Z9010 Acquired absence of unspecified breast and nipple: Secondary | ICD-10-CM | POA: Insufficient documentation

## 2013-05-25 DIAGNOSIS — C50911 Malignant neoplasm of unspecified site of right female breast: Secondary | ICD-10-CM

## 2013-05-25 DIAGNOSIS — Z853 Personal history of malignant neoplasm of breast: Secondary | ICD-10-CM | POA: Insufficient documentation

## 2013-09-23 ENCOUNTER — Other Ambulatory Visit (HOSPITAL_COMMUNITY): Payer: Self-pay | Admitting: Physician Assistant

## 2013-09-23 DIAGNOSIS — R42 Dizziness and giddiness: Secondary | ICD-10-CM

## 2013-09-23 DIAGNOSIS — H547 Unspecified visual loss: Secondary | ICD-10-CM

## 2013-09-26 ENCOUNTER — Ambulatory Visit (HOSPITAL_COMMUNITY): Payer: BC Managed Care – PPO

## 2013-09-28 ENCOUNTER — Other Ambulatory Visit (HOSPITAL_COMMUNITY): Payer: Medicaid Other

## 2013-09-30 ENCOUNTER — Ambulatory Visit (HOSPITAL_COMMUNITY): Admission: RE | Admit: 2013-09-30 | Payer: BC Managed Care – PPO | Source: Ambulatory Visit

## 2013-10-14 ENCOUNTER — Encounter (HOSPITAL_COMMUNITY): Payer: Self-pay

## 2013-10-14 ENCOUNTER — Ambulatory Visit (HOSPITAL_COMMUNITY): Payer: Medicaid Other

## 2013-10-15 NOTE — Progress Notes (Signed)
This encounter was created in error - please disregard.

## 2013-11-24 DIAGNOSIS — C4490 Unspecified malignant neoplasm of skin, unspecified: Secondary | ICD-10-CM

## 2013-11-24 HISTORY — PX: SKIN CANCER EXCISION: SHX779

## 2013-11-24 HISTORY — DX: Unspecified malignant neoplasm of skin, unspecified: C44.90

## 2014-05-02 ENCOUNTER — Other Ambulatory Visit (HOSPITAL_COMMUNITY): Payer: Self-pay | Admitting: Oncology

## 2014-05-02 DIAGNOSIS — C50919 Malignant neoplasm of unspecified site of unspecified female breast: Secondary | ICD-10-CM

## 2014-05-02 MED ORDER — ANASTROZOLE 1 MG PO TABS: 1.0000 mg | ORAL_TABLET | Freq: Every day | ORAL | Status: DC

## 2014-05-30 ENCOUNTER — Ambulatory Visit (HOSPITAL_COMMUNITY)
Admission: RE | Admit: 2014-05-30 | Discharge: 2014-05-30 | Disposition: A | Discharge status: Home / Self Care | Payer: 59 | Source: Ambulatory Visit | Attending: Oncology | Admitting: Oncology

## 2014-05-30 DIAGNOSIS — C50919 Malignant neoplasm of unspecified site of unspecified female breast: Secondary | ICD-10-CM

## 2014-05-30 DIAGNOSIS — Z853 Personal history of malignant neoplasm of breast: Secondary | ICD-10-CM | POA: Insufficient documentation

## 2015-01-04 ENCOUNTER — Encounter (HOSPITAL_COMMUNITY): Payer: 59 | Attending: Oncology | Admitting: Oncology

## 2015-01-04 ENCOUNTER — Encounter (HOSPITAL_COMMUNITY): Payer: Self-pay | Admitting: Oncology

## 2015-01-04 VITALS — BP 158/84 | HR 41 | Temp 98.7°F | Resp 18 | Wt 190.9 lb

## 2015-01-04 DIAGNOSIS — Z91199 Patient's noncompliance with other medical treatment and regimen due to unspecified reason: Secondary | ICD-10-CM

## 2015-01-04 DIAGNOSIS — M8580 Other specified disorders of bone density and structure, unspecified site: Secondary | ICD-10-CM | POA: Insufficient documentation

## 2015-01-04 DIAGNOSIS — C50411 Malignant neoplasm of upper-outer quadrant of right female breast: Secondary | ICD-10-CM

## 2015-01-04 DIAGNOSIS — C50911 Malignant neoplasm of unspecified site of right female breast: Secondary | ICD-10-CM | POA: Diagnosis not present

## 2015-01-04 DIAGNOSIS — Z9119 Patient's noncompliance with other medical treatment and regimen: Secondary | ICD-10-CM

## 2015-01-04 DIAGNOSIS — Z170 Estrogen receptor positive status [ER+]: Secondary | ICD-10-CM

## 2015-01-04 DIAGNOSIS — E876 Hypokalemia: Secondary | ICD-10-CM

## 2015-01-04 HISTORY — DX: Patient's noncompliance with other medical treatment and regimen: Z91.19

## 2015-01-04 HISTORY — DX: Patient's noncompliance with other medical treatment and regimen due to unspecified reason: Z91.199

## 2015-01-04 HISTORY — DX: Other specified disorders of bone density and structure, unspecified site: M85.80

## 2015-01-04 LAB — CBC WITH DIFFERENTIAL/PLATELET
BASOS ABS: 0 10*3/uL (ref 0.0–0.1)
Basophils Relative: 1 % (ref 0–1)
EOS ABS: 0.3 10*3/uL (ref 0.0–0.7)
Eosinophils Relative: 5 % (ref 0–5)
HCT: 39.3 % (ref 36.0–46.0)
Hemoglobin: 13.3 g/dL (ref 12.0–15.0)
LYMPHS ABS: 1 10*3/uL (ref 0.7–4.0)
Lymphocytes Relative: 16 % (ref 12–46)
MCH: 29.3 pg (ref 26.0–34.0)
MCHC: 33.8 g/dL (ref 30.0–36.0)
MCV: 86.6 fL (ref 78.0–100.0)
Monocytes Absolute: 0.4 10*3/uL (ref 0.1–1.0)
Monocytes Relative: 7 % (ref 3–12)
NEUTROS PCT: 71 % (ref 43–77)
Neutro Abs: 4.6 10*3/uL (ref 1.7–7.7)
PLATELETS: 191 10*3/uL (ref 150–400)
RBC: 4.54 MIL/uL (ref 3.87–5.11)
RDW: 13.3 % (ref 11.5–15.5)
WBC: 6.3 10*3/uL (ref 4.0–10.5)

## 2015-01-04 LAB — COMPREHENSIVE METABOLIC PANEL
ALBUMIN: 3.8 g/dL (ref 3.5–5.2)
ALK PHOS: 122 U/L — AB (ref 39–117)
ALT: 19 U/L (ref 0–35)
ANION GAP: 7 (ref 5–15)
AST: 26 U/L (ref 0–37)
BILIRUBIN TOTAL: 0.6 mg/dL (ref 0.3–1.2)
BUN: 8 mg/dL (ref 6–23)
CHLORIDE: 99 mmol/L (ref 96–112)
CO2: 30 mmol/L (ref 19–32)
Calcium: 8.6 mg/dL (ref 8.4–10.5)
Creatinine, Ser: 1.02 mg/dL (ref 0.50–1.10)
GFR calc Af Amer: 66 mL/min — ABNORMAL LOW (ref >90)
GFR calc non Af Amer: 57 mL/min — ABNORMAL LOW (ref >90)
Glucose, Bld: 263 mg/dL — ABNORMAL HIGH (ref 70–99)
POTASSIUM: 3.2 mmol/L — AB (ref 3.5–5.1)
SODIUM: 136 mmol/L (ref 135–145)
TOTAL PROTEIN: 7.1 g/dL (ref 6.0–8.3)

## 2015-01-04 MED ORDER — ANASTROZOLE 1 MG PO TABS: 1.0000 mg | ORAL_TABLET | Freq: Every day | ORAL | Status: DC

## 2015-01-04 MED ORDER — POTASSIUM CHLORIDE CRYS ER 20 MEQ PO TBCR: 20.0000 meq | EXTENDED_RELEASE_TABLET | Freq: Two times a day (BID) | ORAL | Status: DC

## 2015-01-04 NOTE — Progress Notes (Signed)
Purvis Kilts, MD [place] [address]  Osteopenia - Plan: DG Bone Density  Infiltrating ductal carcinoma of breast, right - Plan: CBC with Differential, Comprehensive metabolic panel, CBC with Differential, Comprehensive metabolic panel, anastrozole (ARIMIDEX) 1 MG tablet, CBC with Differential, Comprehensive metabolic panel, DISCONTINUED: anastrozole (ARIMIDEX) 1 MG tablet  Noncompliance  Hypokalemia - Plan: potassium chloride SA (K-DUR,KLOR-CON) 20 MEQ tablet  CURRENT THERAPY: Arimidex beginning on 10/06/2012  INTERVAL HISTORY: Kristen Garner 64 y.o. female returns for followup of Stage II (T2N0MX) infiltrating ductal carcinoma of right breast; ER/PR positive, HER2 negative.  S/P lumpectomy with SLN biopsy on 05/12/2012.  Prior to that on 04/28/2012 she had a biopsy of the breast mass was felt to be DCIS and it was ER positive and PR positive as well the DCIS.  S/P post-operative radiation therapy in October 2013, followed by the initiation of Arimidex on 10/06/2012.    Infiltrating ductal carcinoma of breast   04/28/2012 Initial Diagnosis Diagnosis Breast, right, needle core biopsy, 10 o'clock - AT LEAST DUCTAL CARCINOMA IN SITU WITH ASSOCIATED CALCIFICATION AND NECROSIS.  ER 100% and PR 85%.   05/12/2012 Definitive Surgery Breast, lumpectomy, right - INVASIVE DUCTAL CARCINOMA, 2.1 CM, NOTTHINGHAM COMBINED HISTOLOGIC GRADE I. - ADJACENT INTERMEDIATE GRADE DUCTAL CARCINOMA IN SITU. - NO LVI -  NEGATIVE RESECTION MARGINS.  ER 100%, PR 85%, HER2 negative.   08/09/2012 - 09/22/2012 Radiation Therapy Dr. Orlene Erm.  Dates estimated.   10/06/2012 -  Chemotherapy Arimidex daily   04/15/2013 Imaging Bone density- osteopenia.  Started on Ca++ and Vit D by Dr. Tressie Stalker.   I personally reviewed and went over laboratory results with the patient.  The results are noted within this dictation.  I personally reviewed and went over radiographic studies with the  patient.  The results are noted within this dictation.   She was last seen in 2014 and was a no-show for her follow-up appointments.  She contacted the clinic requesting a refill on her Arimidex and we did not refill until she was seen in the clinic.    She notes that she has been out of her Arimidex for 4-5 months.  She admits openly about her noncompliance with this medication reporting "I haven't taken a full years worth of this medication since I started."  Compliance was encouraged and the importance of daily dosing of this medication was discussed.   She is up to date on her mammogram.  She is due for repeat bone density in May 2016.   Oncologically, she denies any complaints and ROS questioning is negative.   Past Medical History  Diagnosis Date  . Hypertension   . Irregular heart beat   . Anxiety   . Depression   . Diabetes mellitus     takes only the pill  . Headache(784.0)     occiasional migraine  . Breast cancer     right  . Osteopenia 01/04/2015  . Skin cancer 2015    right leg  . Noncompliance 01/04/2015    has ANXIETY DEPRESSION; ATRIAL TACHYCARDIA; HEARTBURN; Infiltrating ductal carcinoma of breast; Acute renal failure; Diarrhea; Dehydration; Hypokalemia; Hyponatremia; Metabolic acidosis; Leucocytosis; Diabetes; HTN (hypertension); UTI (urinary tract infection); Osteopenia; and Noncompliance on her problem list.     is allergic to codeine; penicillins; sulfonamide derivatives; and iodine.  Ms. Southgate had no medications administered during this visit.  Past Surgical History  Procedure Laterality Date  . Abdominal hysterectomy    .  Cholecystectomy    . Breast surgery      biopsy then wide excision   . Sentinel lymph node biopsy    . Skin cancer excision Right 2015    Denies any headaches, dizziness, double vision, fevers, chills, night sweats, nausea, vomiting, diarrhea, constipation, chest pain, heart palpitations, shortness of breath, blood in stool, black  tarry stool, urinary pain, urinary burning, urinary frequency, hematuria.   PHYSICAL EXAMINATION  ECOG PERFORMANCE STATUS: 0 - Asymptomatic  Filed Vitals:   01/04/15 1214  BP: 158/84  Pulse: 41  Temp: 98.7 F (37.1 C)  Resp: 18    GENERAL:alert, no distress, well nourished, well developed, comfortable, cooperative, obese and smiling SKIN: skin color, texture, turgor are normal, no rashes or significant lesions HEAD: Normocephalic, No masses, lesions, tenderness or abnormalities EYES: normal, PERRLA, EOMI, Conjunctiva are pink and non-injected EARS: External ears normal OROPHARYNX:lips, buccal mucosa, and tongue normal and mucous membranes are moist  NECK: supple, no adenopathy, thyroid normal size, non-tender, without nodularity, no stridor, non-tender, trachea midline LYMPH:  no palpable lymphadenopathy, no hepatosplenomegaly BREAST:left breast normal without mass, skin or nipple changes or axillary nodes, right breast is distorted with nipple distortion from lumpectomy without any abnormal masses or lesions LUNGS: clear to auscultation and percussion HEART: regular rate & rhythm, no murmurs, no gallops, S1 normal and S2 normal ABDOMEN:abdomen soft, non-tender, obese, normal bowel sounds and no masses or organomegaly BACK: Back symmetric, no curvature., No CVA tenderness EXTREMITIES:less then 2 second capillary refill, no joint deformities, effusion, or inflammation, no edema, no skin discoloration, no clubbing, no cyanosis  NEURO: alert & oriented x 3 with fluent speech, no focal motor/sensory deficits, gait normal   LABORATORY DATA: CBC    Component Value Date/Time   WBC 6.3 01/04/2015 1322   RBC 4.54 01/04/2015 1322   HGB 13.3 01/04/2015 1322   HCT 39.3 01/04/2015 1322   PLT 191 01/04/2015 1322   MCV 86.6 01/04/2015 1322   MCH 29.3 01/04/2015 1322   MCHC 33.8 01/04/2015 1322   RDW 13.3 01/04/2015 1322   LYMPHSABS 1.0 01/04/2015 1322   MONOABS 0.4 01/04/2015 1322    EOSABS 0.3 01/04/2015 1322   BASOSABS 0.0 01/04/2015 1322      Chemistry      Component Value Date/Time   NA 136 01/04/2015 1322   K 3.2* 01/04/2015 1322   CL 99 01/04/2015 1322   CO2 30 01/04/2015 1322   BUN 8 01/04/2015 1322   CREATININE 1.02 01/04/2015 1322      Component Value Date/Time   CALCIUM 8.6 01/04/2015 1322   ALKPHOS 122* 01/04/2015 1322   AST 26 01/04/2015 1322   ALT 19 01/04/2015 1322   BILITOT 0.6 01/04/2015 1322      RADIOGRAPHIC STUDIES:  05/30/2014  CLINICAL DATA: 64 year old female with prior history of right breast cancer post lumpectomy 2013.  EXAM: DIGITAL DIAGNOSTIC BILATERAL MAMMOGRAM WITH CAD  COMPARISON: Previous exams.  ACR Breast Density Category b: There are scattered areas of fibroglandular density.  FINDINGS: No suspicious masses or calcifications seen in either breast. Postsurgical changes are present in the right breast from prior lumpectomy. A spot compression magnification tangential view of the lumpectomy site in the right breast was performed. There is no mammographic evidence of locally recurrent malignancy.  Mammographic images were processed with CAD.  IMPRESSION: No mammographic evidence of malignancy in either breast.  RECOMMENDATION: Diagnostic mammogram is suggested in 1 year. (Code:[id number]-B-01Y)  I have discussed the findings and recommendations with the  patient. Results were also provided in writing at the conclusion of the visit. If applicable, a reminder letter will be sent to the patient regarding the next appointment.  BI-RADS CATEGORY 2: Benign.   Electronically Signed  By: Everlean Alstrom M.D.  On: 05/30/2014 09:20    ASSESSMENT AND PLAN:  Infiltrating ductal carcinoma of breast Stage II (T2N0MX) infiltrating ductal carcinoma of right breast; ER/PR positive, HER2 negative.  S/P lumpectomy with SLN biopsy on 05/12/2012.  Prior to that on 04/28/2012 she had a biopsy of the breast mass was  felt to be DCIS and it was ER positive and PR positive as well the DCIS.  S/P post-operative radiation therapy in October 2013, followed by the initiation of Arimidex on 10/06/2012.  Noncompliance with appointments, last being seen in 2014.  Bone density ordered to evaluate for osteopenia/osteoporosis in May 2016.  Labs today: CBC diff, CMET.  Labs in 6 months: CBC diff, CMET.  Will send specimen for BCI testing to help decision making regarding extended endocrine therapy past five years.  Return in 6 months for follow-up appointment.  Compliance encouraged.   Osteopenia Overdue for bone density exam.  Order placed.  Will be scheduled for the near future.   Noncompliance Last seen by Dr. Alinda Deem in 2014 with no-show appointments 6 months later.  Has been out of Arimidex x 4-5 months.  She reports "I haven't taken a full years worth of Arimidex since I started."   Hypokalemia Noted today on labs.  Kdur 20 mEq BID x 14 days escribed to her local pharmacy.    THERAPY PLAN:  Continue Arimidex daily.  Bone density exam ordered.  BCI testing to help with decision making regarding extended endocrine therapy past 5 years.  Compliance encouraged.  All questions were answered. The patient knows to call the clinic with any problems, questions or concerns. We can certainly see the patient much sooner if necessary.  Patient and plan discussed with Dr. Ancil Linsey and she is in agreement with the aforementioned.   Marvis Bakken 01/04/2015

## 2015-01-04 NOTE — Assessment & Plan Note (Addendum)
Last seen by Dr. Alinda Deem in 2014 with no-show appointments 6 months later.  Has been out of Arimidex x 4-5 months.  She reports "I haven't taken a full years worth of Arimidex since I started."

## 2015-01-04 NOTE — Assessment & Plan Note (Addendum)
Stage II (T2N0MX) infiltrating ductal carcinoma of right breast; ER/PR positive, HER2 negative.  S/P lumpectomy with SLN biopsy on 05/12/2012.  Prior to that on 04/28/2012 she had a biopsy of the breast mass was felt to be DCIS and it was ER positive and PR positive as well the DCIS.  S/P post-operative radiation therapy in October 2013, followed by the initiation of Arimidex on 10/06/2012.  Noncompliance with appointments, last being seen in 2014.  Bone density ordered to evaluate for osteopenia/osteoporosis in May 2016.  Labs today: CBC diff, CMET.  Labs in 6 months: CBC diff, CMET.  Will send specimen for BCI testing to help decision making regarding extended endocrine therapy past five years.  Return in 6 months for follow-up appointment.  Compliance encouraged.

## 2015-01-04 NOTE — Patient Instructions (Signed)
[place] at [facility]  Discharge Instructions:  Refill on Arimidex with refills.  This was sent to [place]. I will request Breast Cancer index testing to help you decide the utility of continuing Arimidex past the 5 year mark.  You will receive a phone call from someone in [place], [place] regarding this test.  Labs today Labs in 6 months Bone Density exam in May 2016.  Next mammogram is due in July 2016 Return in 6 months for follow-up. _______________________________________________________________  Thank you for choosing [place] at [facility] to provide your oncology and hematology care.  To afford each patient quality time with our providers, please arrive at least 15 minutes before your scheduled appointment.  You need to re-schedule your appointment if you arrive 10 or more minutes late.  We strive to give you quality time with our providers, and arriving late affects you and other patients whose appointments are after yours.  Also, if you no show three or more times for appointments you may be dismissed from the clinic.  Again, thank you for choosing [place] at [place] hope is that these requests will allow you access to exceptional care and in a timely manner. _______________________________________________________________  If you have questions after your visit, please contact our office at [phone] between the hours of 8:30 a.m. and 5:00 p.m. Voicemails left after 4:30 p.m. will not be returned until the following business day. _______________________________________________________________  For prescription refill requests, have your pharmacy contact our office. _______________________________________________________________  Recommendations made by the consultant and any test results will be sent to your referring  physician. _______________________________________________________________

## 2015-01-04 NOTE — Assessment & Plan Note (Signed)
Overdue for bone density exam.  Order placed.  Will be scheduled for the near future.

## 2015-01-04 NOTE — Assessment & Plan Note (Signed)
Noted today on labs.  Kdur 20 mEq BID x 14 days escribed to her local pharmacy.

## 2015-01-05 ENCOUNTER — Encounter (HOSPITAL_COMMUNITY): Payer: Self-pay

## 2015-01-09 ENCOUNTER — Encounter (HOSPITAL_COMMUNITY): Payer: Self-pay

## 2015-01-19 ENCOUNTER — Encounter (HOSPITAL_COMMUNITY): Payer: Self-pay

## 2015-04-18 ENCOUNTER — Other Ambulatory Visit (HOSPITAL_COMMUNITY): Payer: 59

## 2015-07-05 ENCOUNTER — Other Ambulatory Visit (HOSPITAL_COMMUNITY): Payer: 59

## 2015-07-05 ENCOUNTER — Ambulatory Visit (HOSPITAL_COMMUNITY): Payer: 59 | Admitting: Oncology

## 2015-07-13 ENCOUNTER — Encounter (HOSPITAL_COMMUNITY): Payer: Self-pay

## 2015-09-18 ENCOUNTER — Other Ambulatory Visit (HOSPITAL_COMMUNITY): Payer: Self-pay | Admitting: Oncology

## 2015-09-18 DIAGNOSIS — Z1231 Encounter for screening mammogram for malignant neoplasm of breast: Secondary | ICD-10-CM

## 2015-10-02 ENCOUNTER — Ambulatory Visit (HOSPITAL_COMMUNITY)
Admission: RE | Admit: 2015-10-02 | Discharge: 2015-10-02 | Disposition: A | Discharge status: Home / Self Care | Payer: 59 | Source: Ambulatory Visit | Attending: Oncology | Admitting: Oncology

## 2015-10-02 DIAGNOSIS — Z1231 Encounter for screening mammogram for malignant neoplasm of breast: Secondary | ICD-10-CM

## 2015-10-02 DIAGNOSIS — Z853 Personal history of malignant neoplasm of breast: Secondary | ICD-10-CM | POA: Insufficient documentation

## 2015-12-11 DIAGNOSIS — I10 Essential (primary) hypertension: Secondary | ICD-10-CM | POA: Diagnosis not present

## 2015-12-11 DIAGNOSIS — E1165 Type 2 diabetes mellitus with hyperglycemia: Secondary | ICD-10-CM | POA: Diagnosis not present

## 2015-12-11 DIAGNOSIS — Z6831 Body mass index (BMI) 31.0-31.9, adult: Secondary | ICD-10-CM | POA: Diagnosis not present

## 2015-12-11 DIAGNOSIS — R52 Pain, unspecified: Secondary | ICD-10-CM | POA: Diagnosis not present

## 2015-12-11 DIAGNOSIS — E6609 Other obesity due to excess calories: Secondary | ICD-10-CM | POA: Diagnosis not present

## 2015-12-11 DIAGNOSIS — R05 Cough: Secondary | ICD-10-CM | POA: Diagnosis not present

## 2015-12-11 DIAGNOSIS — Z1389 Encounter for screening for other disorder: Secondary | ICD-10-CM | POA: Diagnosis not present

## 2016-01-01 DIAGNOSIS — Z6831 Body mass index (BMI) 31.0-31.9, adult: Secondary | ICD-10-CM | POA: Diagnosis not present

## 2016-01-01 DIAGNOSIS — Z1389 Encounter for screening for other disorder: Secondary | ICD-10-CM | POA: Diagnosis not present

## 2016-01-01 DIAGNOSIS — Z6830 Body mass index (BMI) 30.0-30.9, adult: Secondary | ICD-10-CM | POA: Diagnosis not present

## 2016-01-01 DIAGNOSIS — E6609 Other obesity due to excess calories: Secondary | ICD-10-CM | POA: Diagnosis not present

## 2016-01-01 DIAGNOSIS — Z0000 Encounter for general adult medical examination without abnormal findings: Secondary | ICD-10-CM | POA: Diagnosis not present

## 2016-01-08 DIAGNOSIS — Z1389 Encounter for screening for other disorder: Secondary | ICD-10-CM | POA: Diagnosis not present

## 2016-01-08 DIAGNOSIS — E6609 Other obesity due to excess calories: Secondary | ICD-10-CM | POA: Diagnosis not present

## 2016-01-08 DIAGNOSIS — G4701 Insomnia due to medical condition: Secondary | ICD-10-CM | POA: Diagnosis not present

## 2016-01-08 DIAGNOSIS — E1165 Type 2 diabetes mellitus with hyperglycemia: Secondary | ICD-10-CM | POA: Diagnosis not present

## 2016-01-08 DIAGNOSIS — Z6831 Body mass index (BMI) 31.0-31.9, adult: Secondary | ICD-10-CM | POA: Diagnosis not present

## 2016-01-24 ENCOUNTER — Other Ambulatory Visit: Payer: Self-pay | Admitting: Family Medicine

## 2016-01-24 DIAGNOSIS — R5381 Other malaise: Secondary | ICD-10-CM

## 2016-01-25 ENCOUNTER — Other Ambulatory Visit: Payer: Self-pay | Admitting: Family Medicine

## 2016-01-25 DIAGNOSIS — M8580 Other specified disorders of bone density and structure, unspecified site: Secondary | ICD-10-CM

## 2016-02-05 DIAGNOSIS — G894 Chronic pain syndrome: Secondary | ICD-10-CM | POA: Diagnosis not present

## 2016-02-05 DIAGNOSIS — Z6831 Body mass index (BMI) 31.0-31.9, adult: Secondary | ICD-10-CM | POA: Diagnosis not present

## 2016-02-05 DIAGNOSIS — L259 Unspecified contact dermatitis, unspecified cause: Secondary | ICD-10-CM | POA: Diagnosis not present

## 2016-02-05 DIAGNOSIS — E6609 Other obesity due to excess calories: Secondary | ICD-10-CM | POA: Diagnosis not present

## 2016-02-05 DIAGNOSIS — Z1389 Encounter for screening for other disorder: Secondary | ICD-10-CM | POA: Diagnosis not present

## 2016-03-04 DIAGNOSIS — E782 Mixed hyperlipidemia: Secondary | ICD-10-CM | POA: Diagnosis not present

## 2016-03-04 DIAGNOSIS — E669 Obesity, unspecified: Secondary | ICD-10-CM | POA: Diagnosis not present

## 2016-03-04 DIAGNOSIS — E119 Type 2 diabetes mellitus without complications: Secondary | ICD-10-CM | POA: Diagnosis not present

## 2016-03-04 DIAGNOSIS — Z1389 Encounter for screening for other disorder: Secondary | ICD-10-CM | POA: Diagnosis not present

## 2016-03-04 DIAGNOSIS — Z6831 Body mass index (BMI) 31.0-31.9, adult: Secondary | ICD-10-CM | POA: Diagnosis not present

## 2016-03-04 DIAGNOSIS — E6609 Other obesity due to excess calories: Secondary | ICD-10-CM | POA: Diagnosis not present

## 2016-03-04 DIAGNOSIS — I10 Essential (primary) hypertension: Secondary | ICD-10-CM | POA: Diagnosis not present

## 2016-03-04 DIAGNOSIS — F419 Anxiety disorder, unspecified: Secondary | ICD-10-CM | POA: Diagnosis not present

## 2016-03-04 DIAGNOSIS — F329 Major depressive disorder, single episode, unspecified: Secondary | ICD-10-CM | POA: Diagnosis not present

## 2016-03-19 ENCOUNTER — Other Ambulatory Visit (HOSPITAL_COMMUNITY): Payer: Self-pay | Admitting: Family Medicine

## 2016-03-19 DIAGNOSIS — M8580 Other specified disorders of bone density and structure, unspecified site: Secondary | ICD-10-CM

## 2016-03-21 ENCOUNTER — Inpatient Hospital Stay (HOSPITAL_COMMUNITY): Admission: RE | Admit: 2016-03-21 | Payer: Self-pay | Source: Ambulatory Visit

## 2016-04-01 DIAGNOSIS — J302 Other seasonal allergic rhinitis: Secondary | ICD-10-CM | POA: Diagnosis not present

## 2016-04-01 DIAGNOSIS — E6609 Other obesity due to excess calories: Secondary | ICD-10-CM | POA: Diagnosis not present

## 2016-04-01 DIAGNOSIS — G4700 Insomnia, unspecified: Secondary | ICD-10-CM | POA: Diagnosis not present

## 2016-04-01 DIAGNOSIS — G894 Chronic pain syndrome: Secondary | ICD-10-CM | POA: Diagnosis not present

## 2016-04-01 DIAGNOSIS — Z6831 Body mass index (BMI) 31.0-31.9, adult: Secondary | ICD-10-CM | POA: Diagnosis not present

## 2016-04-04 DIAGNOSIS — M1712 Unilateral primary osteoarthritis, left knee: Secondary | ICD-10-CM | POA: Diagnosis not present

## 2016-04-04 DIAGNOSIS — M1711 Unilateral primary osteoarthritis, right knee: Secondary | ICD-10-CM | POA: Diagnosis not present

## 2016-04-04 DIAGNOSIS — M19012 Primary osteoarthritis, left shoulder: Secondary | ICD-10-CM | POA: Diagnosis not present

## 2016-04-18 DIAGNOSIS — M545 Low back pain: Secondary | ICD-10-CM | POA: Diagnosis not present

## 2016-04-29 DIAGNOSIS — F419 Anxiety disorder, unspecified: Secondary | ICD-10-CM | POA: Diagnosis not present

## 2016-04-29 DIAGNOSIS — G894 Chronic pain syndrome: Secondary | ICD-10-CM | POA: Diagnosis not present

## 2016-04-29 DIAGNOSIS — Z6829 Body mass index (BMI) 29.0-29.9, adult: Secondary | ICD-10-CM | POA: Diagnosis not present

## 2016-04-29 DIAGNOSIS — E663 Overweight: Secondary | ICD-10-CM | POA: Diagnosis not present

## 2016-04-29 DIAGNOSIS — I10 Essential (primary) hypertension: Secondary | ICD-10-CM | POA: Diagnosis not present

## 2016-04-29 DIAGNOSIS — E1165 Type 2 diabetes mellitus with hyperglycemia: Secondary | ICD-10-CM | POA: Diagnosis not present

## 2016-04-29 DIAGNOSIS — Z1389 Encounter for screening for other disorder: Secondary | ICD-10-CM | POA: Diagnosis not present

## 2016-04-29 DIAGNOSIS — E118 Type 2 diabetes mellitus with unspecified complications: Secondary | ICD-10-CM | POA: Diagnosis not present

## 2016-04-29 DIAGNOSIS — E782 Mixed hyperlipidemia: Secondary | ICD-10-CM | POA: Diagnosis not present

## 2016-05-23 DIAGNOSIS — Z6830 Body mass index (BMI) 30.0-30.9, adult: Secondary | ICD-10-CM | POA: Diagnosis not present

## 2016-05-23 DIAGNOSIS — Z1389 Encounter for screening for other disorder: Secondary | ICD-10-CM | POA: Diagnosis not present

## 2016-05-23 DIAGNOSIS — E6609 Other obesity due to excess calories: Secondary | ICD-10-CM | POA: Diagnosis not present

## 2016-05-23 DIAGNOSIS — G894 Chronic pain syndrome: Secondary | ICD-10-CM | POA: Diagnosis not present

## 2016-05-23 DIAGNOSIS — F419 Anxiety disorder, unspecified: Secondary | ICD-10-CM | POA: Diagnosis not present

## 2016-06-02 ENCOUNTER — Other Ambulatory Visit: Payer: Self-pay | Admitting: *Deleted

## 2016-06-02 NOTE — Patient Outreach (Signed)
[facility]) Care Management  06/02/2016  Kristen Garner 1951/11/21 [id number]   Referral from MD office:   Telephone call to patient; left HIPPA compliant voice mail requesting call back.  Plan: Will follow up.  Sherrin Daisy, RN BSN [place] Management Coordinator [facility] Care Management  [phone]

## 2016-06-04 ENCOUNTER — Other Ambulatory Visit: Payer: Self-pay | Admitting: *Deleted

## 2016-06-04 NOTE — Patient Outreach (Signed)
[facility]) Care Management  06/04/2016  Kristen Garner 1951/06/08 [id number]  Telephone call attempt x 2 ; left HIPPA compliant voice mail requesting return call.   Plan: Will follow up.  Sherrin Daisy, RN BSN [place] Management Coordinator [facility] Care Management  [phone]

## 2016-06-06 ENCOUNTER — Encounter: Payer: Self-pay | Admitting: "Endocrinology

## 2016-06-06 ENCOUNTER — Encounter: Payer: Self-pay | Admitting: *Deleted

## 2016-06-06 ENCOUNTER — Other Ambulatory Visit: Payer: Self-pay | Admitting: *Deleted

## 2016-06-06 NOTE — Progress Notes (Signed)
This encounter was created in error - please disregard.

## 2016-06-06 NOTE — Patient Outreach (Signed)
[facility]) Care Management  06/06/2016  Kristen Garner September 26, 1951 [id number]  Telephone call to patient; left message on voice mail requesting return call.  Plan: Geophysicist/field seismologist. Follow up in 10 business days.  Sherrin Daisy, RN BSN [place] Management Coordinator [facility] Care Management  [phone]

## 2016-06-20 ENCOUNTER — Encounter: Payer: Self-pay | Admitting: *Deleted

## 2016-06-20 ENCOUNTER — Other Ambulatory Visit: Payer: Self-pay | Admitting: *Deleted

## 2016-06-20 NOTE — Patient Outreach (Signed)
[facility]) Care Management  06/20/2016  [name] [birth year] [id number]  Unsuccessful call attempts to patient; no response from outreach letter sent to patient.  Telephone call to MD office to notify of being unable to contact patient. Left message on referral department voice mail to advise of above.   Plan; Send MD closure letter. Close case.  Sherrin Daisy, RN BSN [place] Management Coordinator [facility] Care Management  [phone]

## 2016-06-23 ENCOUNTER — Encounter: Payer: Self-pay | Admitting: *Deleted

## 2016-06-23 DIAGNOSIS — Z6830 Body mass index (BMI) 30.0-30.9, adult: Secondary | ICD-10-CM | POA: Diagnosis not present

## 2016-06-23 DIAGNOSIS — E876 Hypokalemia: Secondary | ICD-10-CM | POA: Diagnosis not present

## 2016-06-23 DIAGNOSIS — E6609 Other obesity due to excess calories: Secondary | ICD-10-CM | POA: Diagnosis not present

## 2016-06-23 DIAGNOSIS — G894 Chronic pain syndrome: Secondary | ICD-10-CM | POA: Diagnosis not present

## 2016-06-23 DIAGNOSIS — Z1389 Encounter for screening for other disorder: Secondary | ICD-10-CM | POA: Diagnosis not present

## 2016-07-22 ENCOUNTER — Other Ambulatory Visit (HOSPITAL_COMMUNITY): Payer: Self-pay | Admitting: Family Medicine

## 2016-07-22 DIAGNOSIS — R229 Localized swelling, mass and lump, unspecified: Principal | ICD-10-CM

## 2016-07-22 DIAGNOSIS — IMO0002 Reserved for concepts with insufficient information to code with codable children: Secondary | ICD-10-CM

## 2016-07-23 ENCOUNTER — Other Ambulatory Visit (HOSPITAL_COMMUNITY): Payer: Self-pay | Admitting: Family Medicine

## 2016-07-23 DIAGNOSIS — R229 Localized swelling, mass and lump, unspecified: Principal | ICD-10-CM

## 2016-07-23 DIAGNOSIS — IMO0002 Reserved for concepts with insufficient information to code with codable children: Secondary | ICD-10-CM

## 2016-07-25 ENCOUNTER — Other Ambulatory Visit (HOSPITAL_COMMUNITY): Payer: Self-pay | Admitting: Family Medicine

## 2016-07-25 DIAGNOSIS — N644 Mastodynia: Secondary | ICD-10-CM | POA: Diagnosis not present

## 2016-07-25 DIAGNOSIS — D0591 Unspecified type of carcinoma in situ of right breast: Secondary | ICD-10-CM | POA: Diagnosis not present

## 2016-07-25 DIAGNOSIS — Z6829 Body mass index (BMI) 29.0-29.9, adult: Secondary | ICD-10-CM | POA: Diagnosis not present

## 2016-07-25 DIAGNOSIS — Z1389 Encounter for screening for other disorder: Secondary | ICD-10-CM | POA: Diagnosis not present

## 2016-07-25 DIAGNOSIS — G894 Chronic pain syndrome: Secondary | ICD-10-CM | POA: Diagnosis not present

## 2016-07-29 ENCOUNTER — Other Ambulatory Visit (HOSPITAL_COMMUNITY): Payer: Self-pay | Admitting: Family Medicine

## 2016-07-29 ENCOUNTER — Ambulatory Visit (HOSPITAL_COMMUNITY)
Admission: RE | Admit: 2016-07-29 | Discharge: 2016-07-29 | Disposition: A | Discharge status: Home / Self Care | Payer: PPO | Source: Ambulatory Visit | Attending: Family Medicine | Admitting: Family Medicine

## 2016-07-29 DIAGNOSIS — N644 Mastodynia: Secondary | ICD-10-CM | POA: Insufficient documentation

## 2016-07-29 DIAGNOSIS — N641 Fat necrosis of breast: Secondary | ICD-10-CM | POA: Insufficient documentation

## 2016-08-25 DIAGNOSIS — Z6828 Body mass index (BMI) 28.0-28.9, adult: Secondary | ICD-10-CM | POA: Diagnosis not present

## 2016-08-25 DIAGNOSIS — Z23 Encounter for immunization: Secondary | ICD-10-CM | POA: Diagnosis not present

## 2016-08-25 DIAGNOSIS — G894 Chronic pain syndrome: Secondary | ICD-10-CM | POA: Diagnosis not present

## 2016-08-25 DIAGNOSIS — Z1389 Encounter for screening for other disorder: Secondary | ICD-10-CM | POA: Diagnosis not present

## 2016-09-25 DIAGNOSIS — G4700 Insomnia, unspecified: Secondary | ICD-10-CM | POA: Diagnosis not present

## 2016-09-25 DIAGNOSIS — G43909 Migraine, unspecified, not intractable, without status migrainosus: Secondary | ICD-10-CM | POA: Diagnosis not present

## 2016-09-25 DIAGNOSIS — Z6828 Body mass index (BMI) 28.0-28.9, adult: Secondary | ICD-10-CM | POA: Diagnosis not present

## 2016-09-25 DIAGNOSIS — E1140 Type 2 diabetes mellitus with diabetic neuropathy, unspecified: Secondary | ICD-10-CM | POA: Diagnosis not present

## 2016-10-10 ENCOUNTER — Inpatient Hospital Stay (HOSPITAL_COMMUNITY)
Admission: EM | Admit: 2016-10-10 | Discharge: 2016-10-16 | DRG: 247 | Disposition: A | Discharge status: Home / Self Care | Payer: PPO | Attending: Cardiology | Admitting: Cardiology

## 2016-10-10 ENCOUNTER — Emergency Department (HOSPITAL_COMMUNITY): Payer: PPO

## 2016-10-10 ENCOUNTER — Encounter (HOSPITAL_COMMUNITY): Payer: Self-pay | Admitting: Emergency Medicine

## 2016-10-10 DIAGNOSIS — E1122 Type 2 diabetes mellitus with diabetic chronic kidney disease: Secondary | ICD-10-CM | POA: Diagnosis not present

## 2016-10-10 DIAGNOSIS — I10 Essential (primary) hypertension: Secondary | ICD-10-CM | POA: Diagnosis not present

## 2016-10-10 DIAGNOSIS — E785 Hyperlipidemia, unspecified: Secondary | ICD-10-CM | POA: Diagnosis present

## 2016-10-10 DIAGNOSIS — N183 Chronic kidney disease, stage 3 (moderate): Secondary | ICD-10-CM | POA: Diagnosis present

## 2016-10-10 DIAGNOSIS — E119 Type 2 diabetes mellitus without complications: Secondary | ICD-10-CM | POA: Diagnosis present

## 2016-10-10 DIAGNOSIS — M8580 Other specified disorders of bone density and structure, unspecified site: Secondary | ICD-10-CM | POA: Diagnosis present

## 2016-10-10 DIAGNOSIS — I2582 Chronic total occlusion of coronary artery: Secondary | ICD-10-CM | POA: Diagnosis present

## 2016-10-10 DIAGNOSIS — Z79899 Other long term (current) drug therapy: Secondary | ICD-10-CM

## 2016-10-10 DIAGNOSIS — N170 Acute kidney failure with tubular necrosis: Secondary | ICD-10-CM | POA: Diagnosis not present

## 2016-10-10 DIAGNOSIS — R590 Localized enlarged lymph nodes: Secondary | ICD-10-CM | POA: Diagnosis present

## 2016-10-10 DIAGNOSIS — R7989 Other specified abnormal findings of blood chemistry: Secondary | ICD-10-CM | POA: Diagnosis not present

## 2016-10-10 DIAGNOSIS — I130 Hypertensive heart and chronic kidney disease with heart failure and stage 1 through stage 4 chronic kidney disease, or unspecified chronic kidney disease: Secondary | ICD-10-CM | POA: Diagnosis not present

## 2016-10-10 DIAGNOSIS — I255 Ischemic cardiomyopathy: Secondary | ICD-10-CM

## 2016-10-10 DIAGNOSIS — I4581 Long QT syndrome: Secondary | ICD-10-CM | POA: Diagnosis not present

## 2016-10-10 DIAGNOSIS — F418 Other specified anxiety disorders: Secondary | ICD-10-CM | POA: Diagnosis present

## 2016-10-10 DIAGNOSIS — I5043 Acute on chronic combined systolic (congestive) and diastolic (congestive) heart failure: Secondary | ICD-10-CM | POA: Diagnosis not present

## 2016-10-10 DIAGNOSIS — I493 Ventricular premature depolarization: Secondary | ICD-10-CM | POA: Diagnosis present

## 2016-10-10 DIAGNOSIS — Z7982 Long term (current) use of aspirin: Secondary | ICD-10-CM

## 2016-10-10 DIAGNOSIS — Z853 Personal history of malignant neoplasm of breast: Secondary | ICD-10-CM

## 2016-10-10 DIAGNOSIS — I110 Hypertensive heart disease with heart failure: Secondary | ICD-10-CM | POA: Diagnosis not present

## 2016-10-10 DIAGNOSIS — Z79811 Long term (current) use of aromatase inhibitors: Secondary | ICD-10-CM

## 2016-10-10 DIAGNOSIS — J45909 Unspecified asthma, uncomplicated: Secondary | ICD-10-CM | POA: Diagnosis present

## 2016-10-10 DIAGNOSIS — Z79891 Long term (current) use of opiate analgesic: Secondary | ICD-10-CM

## 2016-10-10 DIAGNOSIS — I25110 Atherosclerotic heart disease of native coronary artery with unstable angina pectoris: Secondary | ICD-10-CM | POA: Diagnosis not present

## 2016-10-10 DIAGNOSIS — I200 Unstable angina: Secondary | ICD-10-CM

## 2016-10-10 DIAGNOSIS — N179 Acute kidney failure, unspecified: Secondary | ICD-10-CM | POA: Diagnosis present

## 2016-10-10 DIAGNOSIS — R778 Other specified abnormalities of plasma proteins: Secondary | ICD-10-CM

## 2016-10-10 DIAGNOSIS — Z955 Presence of coronary angioplasty implant and graft: Secondary | ICD-10-CM

## 2016-10-10 DIAGNOSIS — R748 Abnormal levels of other serum enzymes: Secondary | ICD-10-CM | POA: Diagnosis present

## 2016-10-10 DIAGNOSIS — E876 Hypokalemia: Secondary | ICD-10-CM | POA: Diagnosis present

## 2016-10-10 DIAGNOSIS — R0600 Dyspnea, unspecified: Secondary | ICD-10-CM | POA: Diagnosis not present

## 2016-10-10 DIAGNOSIS — R0602 Shortness of breath: Secondary | ICD-10-CM | POA: Diagnosis present

## 2016-10-10 DIAGNOSIS — Z87891 Personal history of nicotine dependence: Secondary | ICD-10-CM

## 2016-10-10 DIAGNOSIS — Z885 Allergy status to narcotic agent status: Secondary | ICD-10-CM | POA: Diagnosis not present

## 2016-10-10 DIAGNOSIS — I5023 Acute on chronic systolic (congestive) heart failure: Secondary | ICD-10-CM | POA: Diagnosis not present

## 2016-10-10 DIAGNOSIS — Z85828 Personal history of other malignant neoplasm of skin: Secondary | ICD-10-CM

## 2016-10-10 DIAGNOSIS — J90 Pleural effusion, not elsewhere classified: Secondary | ICD-10-CM

## 2016-10-10 DIAGNOSIS — I2510 Atherosclerotic heart disease of native coronary artery without angina pectoris: Secondary | ICD-10-CM | POA: Diagnosis not present

## 2016-10-10 DIAGNOSIS — F341 Dysthymic disorder: Secondary | ICD-10-CM | POA: Diagnosis not present

## 2016-10-10 DIAGNOSIS — I471 Supraventricular tachycardia: Secondary | ICD-10-CM | POA: Diagnosis not present

## 2016-10-10 DIAGNOSIS — Z888 Allergy status to other drugs, medicaments and biological substances status: Secondary | ICD-10-CM

## 2016-10-10 DIAGNOSIS — I4719 Other supraventricular tachycardia: Secondary | ICD-10-CM | POA: Clinically undetermined

## 2016-10-10 DIAGNOSIS — I25118 Atherosclerotic heart disease of native coronary artery with other forms of angina pectoris: Secondary | ICD-10-CM | POA: Diagnosis present

## 2016-10-10 DIAGNOSIS — K219 Gastro-esophageal reflux disease without esophagitis: Secondary | ICD-10-CM | POA: Diagnosis not present

## 2016-10-10 DIAGNOSIS — Z880 Allergy status to penicillin: Secondary | ICD-10-CM

## 2016-10-10 DIAGNOSIS — Z7984 Long term (current) use of oral hypoglycemic drugs: Secondary | ICD-10-CM | POA: Diagnosis not present

## 2016-10-10 DIAGNOSIS — I5021 Acute systolic (congestive) heart failure: Secondary | ICD-10-CM | POA: Diagnosis not present

## 2016-10-10 HISTORY — DX: Atherosclerotic heart disease of native coronary artery without angina pectoris: I25.10

## 2016-10-10 LAB — URINALYSIS, ROUTINE W REFLEX MICROSCOPIC
Bilirubin Urine: NEGATIVE
Glucose, UA: NEGATIVE mg/dL
Hgb urine dipstick: NEGATIVE
KETONES UR: NEGATIVE mg/dL
NITRITE: NEGATIVE
PROTEIN: NEGATIVE mg/dL
Specific Gravity, Urine: 1.02 (ref 1.005–1.030)
pH: 5.5 (ref 5.0–8.0)

## 2016-10-10 LAB — BASIC METABOLIC PANEL
ANION GAP: 12 (ref 5–15)
BUN: 12 mg/dL (ref 6–20)
CALCIUM: 9.1 mg/dL (ref 8.9–10.3)
CHLORIDE: 95 mmol/L — AB (ref 101–111)
CO2: 29 mmol/L (ref 22–32)
Creatinine, Ser: 1.19 mg/dL — ABNORMAL HIGH (ref 0.44–1.00)
GFR calc non Af Amer: 47 mL/min — ABNORMAL LOW (ref >60)
GFR, EST AFRICAN AMERICAN: 54 mL/min — AB (ref >60)
Glucose, Bld: 166 mg/dL — ABNORMAL HIGH (ref 65–99)
Potassium: 3.4 mmol/L — ABNORMAL LOW (ref 3.5–5.1)
Sodium: 136 mmol/L (ref 135–145)

## 2016-10-10 LAB — URINE MICROSCOPIC-ADD ON: RBC / HPF: NONE SEEN RBC/hpf (ref 0–5)

## 2016-10-10 LAB — CBC WITH DIFFERENTIAL/PLATELET
BASOS ABS: 0 10*3/uL (ref 0.0–0.1)
BASOS PCT: 1 %
Eosinophils Absolute: 0.2 10*3/uL (ref 0.0–0.7)
Eosinophils Relative: 2 %
HEMATOCRIT: 40.8 % (ref 36.0–46.0)
Hemoglobin: 13.8 g/dL (ref 12.0–15.0)
Lymphocytes Relative: 13 %
Lymphs Abs: 1.1 10*3/uL (ref 0.7–4.0)
MCH: 30 pg (ref 26.0–34.0)
MCHC: 33.8 g/dL (ref 30.0–36.0)
MCV: 88.7 fL (ref 78.0–100.0)
MONO ABS: 0.4 10*3/uL (ref 0.1–1.0)
Monocytes Relative: 5 %
NEUTROS ABS: 6.7 10*3/uL (ref 1.7–7.7)
NEUTROS PCT: 79 %
Platelets: 269 10*3/uL (ref 150–400)
RBC: 4.6 MIL/uL (ref 3.87–5.11)
RDW: 14 % (ref 11.5–15.5)
WBC: 8.4 10*3/uL (ref 4.0–10.5)

## 2016-10-10 LAB — TROPONIN I
TROPONIN I: 0.08 ng/mL — AB (ref <0.03)
Troponin I: 0.06 ng/mL (ref <0.03)

## 2016-10-10 LAB — BRAIN NATRIURETIC PEPTIDE: B NATRIURETIC PEPTIDE 5: 496 pg/mL — AB (ref 0.0–100.0)

## 2016-10-10 LAB — GLUCOSE, CAPILLARY: GLUCOSE-CAPILLARY: 155 mg/dL — AB (ref 65–99)

## 2016-10-10 LAB — D-DIMER, QUANTITATIVE (NOT AT ARMC): D DIMER QUANT: 0.46 ug{FEU}/mL (ref 0.00–0.50)

## 2016-10-10 LAB — TSH: TSH: 2.285 u[IU]/mL (ref 0.350–4.500)

## 2016-10-10 LAB — LACTIC ACID, PLASMA: LACTIC ACID, VENOUS: 1.6 mmol/L (ref 0.5–1.9)

## 2016-10-10 LAB — MAGNESIUM: Magnesium: 1.5 mg/dL — ABNORMAL LOW (ref 1.7–2.4)

## 2016-10-10 MED ORDER — ASPIRIN EC 81 MG PO TBEC
81.0000 mg | DELAYED_RELEASE_TABLET | Freq: Every day | ORAL | Status: DC
  Administered 2016-10-10 – 2016-10-12 (×3): 81 mg via ORAL
  Filled 2016-10-10 (×3): qty 1

## 2016-10-10 MED ORDER — IPRATROPIUM-ALBUTEROL 0.5-2.5 (3) MG/3ML IN SOLN
3.0000 mL | Freq: Once | RESPIRATORY_TRACT | Status: AC
  Administered 2016-10-10: 3 mL via RESPIRATORY_TRACT
  Filled 2016-10-10: qty 3

## 2016-10-10 MED ORDER — INSULIN ASPART 100 UNIT/ML ~~LOC~~ SOLN
0.0000 [IU] | Freq: Three times a day (TID) | SUBCUTANEOUS | Status: DC
  Administered 2016-10-11: 2 [IU] via SUBCUTANEOUS
  Administered 2016-10-14: 19:00:00 5 [IU] via SUBCUTANEOUS
  Administered 2016-10-14 – 2016-10-15 (×2): 2 [IU] via SUBCUTANEOUS

## 2016-10-10 MED ORDER — IOPAMIDOL (ISOVUE-370) INJECTION 76%
100.0000 mL | Freq: Once | INTRAVENOUS | Status: AC | PRN
  Administered 2016-10-10: 100 mL via INTRAVENOUS

## 2016-10-10 MED ORDER — ALBUTEROL SULFATE (2.5 MG/3ML) 0.083% IN NEBU
2.5000 mg | INHALATION_SOLUTION | Freq: Once | RESPIRATORY_TRACT | Status: AC
  Administered 2016-10-10: 2.5 mg via RESPIRATORY_TRACT
  Filled 2016-10-10: qty 3

## 2016-10-10 MED ORDER — LORAZEPAM 0.5 MG PO TABS
1.0000 mg | ORAL_TABLET | Freq: Three times a day (TID) | ORAL | Status: DC | PRN
Start: 2016-10-10 — End: 2016-10-16
  Administered 2016-10-11 – 2016-10-15 (×10): 1 mg via ORAL
  Filled 2016-10-10: qty 1
  Filled 2016-10-10: qty 2
  Filled 2016-10-10 (×4): qty 1
  Filled 2016-10-10: qty 2
  Filled 2016-10-10 (×2): qty 1
  Filled 2016-10-10: qty 2

## 2016-10-10 MED ORDER — ZOLPIDEM TARTRATE 5 MG PO TABS
5.0000 mg | ORAL_TABLET | Freq: Every day | ORAL | Status: DC
  Administered 2016-10-10 – 2016-10-15 (×6): 5 mg via ORAL
  Filled 2016-10-10 (×6): qty 1

## 2016-10-10 MED ORDER — GLIMEPIRIDE 2 MG PO TABS
4.0000 mg | ORAL_TABLET | Freq: Every day | ORAL | Status: DC
  Administered 2016-10-11: 4 mg via ORAL
  Filled 2016-10-10 (×2): qty 2

## 2016-10-10 MED ORDER — ANASTROZOLE 1 MG PO TABS
1.0000 mg | ORAL_TABLET | Freq: Every day | ORAL | Status: DC
  Administered 2016-10-11 – 2016-10-15 (×4): 1 mg via ORAL
  Filled 2016-10-10 (×6): qty 1

## 2016-10-10 MED ORDER — ALBUTEROL SULFATE (2.5 MG/3ML) 0.083% IN NEBU
2.5000 mg | INHALATION_SOLUTION | Freq: Four times a day (QID) | RESPIRATORY_TRACT | Status: DC
  Administered 2016-10-10 – 2016-10-13 (×10): 2.5 mg via RESPIRATORY_TRACT
  Filled 2016-10-10 (×10): qty 3

## 2016-10-10 MED ORDER — ALBUTEROL SULFATE (2.5 MG/3ML) 0.083% IN NEBU: 2.5000 mg | INHALATION_SOLUTION | RESPIRATORY_TRACT | Status: DC | PRN

## 2016-10-10 MED ORDER — ONDANSETRON HCL 4 MG/2ML IJ SOLN
4.0000 mg | Freq: Four times a day (QID) | INTRAMUSCULAR | Status: DC | PRN
  Filled 2016-10-10: qty 2

## 2016-10-10 MED ORDER — ALBUTEROL SULFATE (2.5 MG/3ML) 0.083% IN NEBU: 3.0000 mL | INHALATION_SOLUTION | RESPIRATORY_TRACT | Status: DC | PRN

## 2016-10-10 MED ORDER — PANTOPRAZOLE SODIUM 40 MG PO TBEC
40.0000 mg | DELAYED_RELEASE_TABLET | Freq: Two times a day (BID) | ORAL | Status: DC
  Administered 2016-10-11 – 2016-10-16 (×10): 40 mg via ORAL
  Filled 2016-10-10 (×12): qty 1

## 2016-10-10 MED ORDER — HEPARIN SODIUM (PORCINE) 5000 UNIT/ML IJ SOLN
5000.0000 [IU] | Freq: Three times a day (TID) | INTRAMUSCULAR | Status: DC
Start: 2016-10-10 — End: 2016-10-13
  Administered 2016-10-10 – 2016-10-13 (×8): 5000 [IU] via SUBCUTANEOUS
  Filled 2016-10-10 (×8): qty 1

## 2016-10-10 MED ORDER — POTASSIUM CHLORIDE CRYS ER 20 MEQ PO TBCR
20.0000 meq | EXTENDED_RELEASE_TABLET | Freq: Two times a day (BID) | ORAL | Status: DC
  Administered 2016-10-11 (×2): 20 meq via ORAL
  Filled 2016-10-10 (×4): qty 1

## 2016-10-10 MED ORDER — ONDANSETRON HCL 4 MG PO TABS: 4.0000 mg | ORAL_TABLET | Freq: Four times a day (QID) | ORAL | Status: DC | PRN

## 2016-10-10 MED ORDER — INSULIN ASPART 100 UNIT/ML ~~LOC~~ SOLN
0.0000 [IU] | Freq: Every day | SUBCUTANEOUS | Status: DC
  Administered 2016-10-13 – 2016-10-14 (×2): 3 [IU] via SUBCUTANEOUS

## 2016-10-10 MED ORDER — ALBUTEROL SULFATE (2.5 MG/3ML) 0.083% IN NEBU
INHALATION_SOLUTION | RESPIRATORY_TRACT | Status: AC
  Filled 2016-10-10: qty 3

## 2016-10-10 MED ORDER — METOPROLOL TARTRATE 50 MG PO TABS
50.0000 mg | ORAL_TABLET | Freq: Two times a day (BID) | ORAL | Status: DC
  Administered 2016-10-10 – 2016-10-12 (×4): 50 mg via ORAL
  Filled 2016-10-10 (×4): qty 1

## 2016-10-10 NOTE — ED Triage Notes (Signed)
Reports of feeling short of breath x3 days. Denies fever.

## 2016-10-10 NOTE — ED Notes (Signed)
Patient ambulatory to restroom.  Patient's o2 sat 97%.  Patient did not have any distress while walking.

## 2016-10-10 NOTE — ED Notes (Signed)
Critical Troponin 0.06. EDP aware.

## 2016-10-10 NOTE — H&P (Addendum)
History and Physical    Kristen Garner:[id number] DOB: December 03, 1950 DOA: 10/10/2016  Referring MD/NP/PA: Francine Graven, DO PCP: Purvis Kilts, MD Outpatient Specialists: None Patient coming from: Home  Chief Complaint: Shortness of Breath  HPI: Kristen Garner is a 65 y.o. female with medical history significant of HLD, anxiety, depression, DM type 2, and HTN, presents to the ED with complaints of worsening SOB for the past 4 days. She reports that when she lays down at night, her SOB is worsened. She has to sit up for relief. She states that she has seen her doctor recently in which she was prescribed an albuterol inhaler for asthma. She has also been taking alka seltzer for heart burn on occasion. She states that she has some nausea. Denies CP, vomiting, and abdominal pain. It is noted that she lives with her friend that smokes. She also lives with her nephew. It is also noted that she has seen her cardiologist in the past for a stress test which there were no problems at the time. In the ED, afebrile, tachycardic,and slightly hypertensive. CBC is unremarkable. Potassium 3.4, creatinine 1.19, glucose 166, BNP 496.0, Troponin I    0.06, lactic acid wnl, and D-dimer is wnl. CXR shows findings consistent with slight interstitial edema and small bilateral pleural effusions of unknown etiology. EKG shows sinus tachycardia. Urine culture is pending. Hospitalist was asked to admit patient for further evaluation.  Review of Systems: As per HPI otherwise 10 point review of systems negative.    Past Medical History:  Diagnosis Date  . Anxiety   . Breast cancer ([place])    right  . Depression   . Diabetes mellitus    takes only the pill  . Headache(784.0)    occiasional migraine  . Hypertension   . Irregular heart beat   . Noncompliance 01/04/2015  . Osteopenia 01/04/2015  . Skin cancer 2015   right leg    Past Surgical History:  Procedure Laterality Date  . ABDOMINAL  HYSTERECTOMY    . BREAST SURGERY     biopsy then wide excision   . CHOLECYSTECTOMY    . SENTINEL LYMPH NODE BIOPSY    . SKIN CANCER EXCISION Right 2015     reports that she has quit smoking. She has never used smokeless tobacco. She reports that she does not drink alcohol or use drugs.  Allergies  Allergen Reactions  . Codeine Itching  . Penicillins Other (See Comments)    Child hood allergy  . Sulfonamide Derivatives Itching  . Iodine Rash    Family History  Problem Relation Age of Onset  . Diabetes Father   . Stroke Maternal Grandmother      Prior to Admission medications   Medication Sig Start Date End Date Taking? Authorizing Provider  albuterol (PROVENTIL HFA;VENTOLIN HFA) 108 (90 BASE) MCG/ACT inhaler Inhale 2 puffs into the lungs every 6 (six) hours as needed.    Historical Provider, MD  anastrozole (ARIMIDEX) 1 MG tablet Take 1 tablet (1 mg total) by mouth daily. 01/04/15   Baird Cancer, PA-C  citalopram (CELEXA) 20 MG tablet Take 40 mg by mouth daily.     Historical Provider, MD  glimepiride (AMARYL) 4 MG tablet Take 4 mg by mouth daily before breakfast.    Historical Provider, MD  metoprolol (LOPRESSOR) 50 MG tablet Take 1 tablet (50 mg total) by mouth 2 (two) times daily. 09/11/11   Deboraha Sprang, MD  ondansetron (ZOFRAN) 4 MG tablet Take  4 mg by mouth every 6 (six) hours as needed for nausea.    Historical Provider, MD  potassium chloride SA (K-DUR,KLOR-CON) 20 MEQ tablet Take 1 tablet (20 mEq total) by mouth 2 (two) times daily. 01/04/15   Baird Cancer, PA-C  sodium bicarbonate 650 MG tablet Take 2 tablets (1,300 mg total) by mouth 3 (three) times daily. 04/06/13   Kathie Dike, MD  zolpidem (AMBIEN) 10 MG tablet Take 10 mg by mouth at bedtime.    Historical Provider, MD    Physical Exam: Vitals:   10/10/16 1653 10/10/16 1730 10/10/16 1921 10/10/16 1930  BP:  144/97 160/92 (!) 160/110  Pulse:  99 99 103  Resp:  21 18 16   Temp:   98.1 F (36.7 C)     TempSrc:      SpO2: 98% 93% 96% 95%  Weight:      Height:          Constitutional: NAD, calm, comfortable Vitals:   10/10/16 1653 10/10/16 1730 10/10/16 1921 10/10/16 1930  BP:  144/97 160/92 (!) 160/110  Pulse:  99 99 103  Resp:  21 18 16   Temp:   98.1 F (36.7 C)   TempSrc:      SpO2: 98% 93% 96% 95%  Weight:      Height:       Eyes: PERRL, lids and conjunctivae normal ENMT: Mucous membranes are moist. Posterior pharynx clear of any exudate or lesions.Normal dentition.  Neck: normal, supple, no masses, no thyromegaly Respiratory: clear to auscultation bilaterally, no wheezing, no crackles. Normal respiratory effort. No accessory muscle use.  Cardiovascular: Regular rate and rhythm, no murmurs / rubs / gallops. No extremity edema. 2+ pedal pulses. No carotid bruits.  Abdomen: no tenderness, no masses palpated. No hepatosplenomegaly. Bowel sounds positive.  Musculoskeletal: no clubbing / cyanosis. No joint deformity upper and lower extremities. Good ROM, no contractures. Normal muscle tone.  Skin: no rashes, lesions, ulcers. No induration Neurologic: CN 2-12 grossly intact. Sensation intact, DTR normal. Strength 5/5 in all 4.  Psychiatric: Normal judgment and insight. Alert and oriented x 3. Normal mood.    Labs on Admission: I have personally reviewed following labs and imaging studies  CBC:  Recent Labs Lab 10/10/16 1622  WBC 8.4  NEUTROABS 6.7  HGB 13.8  HCT 40.8  MCV 88.7  PLT 454   Basic Metabolic Panel:  Recent Labs Lab 10/10/16 1622  NA 136  K 3.4*  CL 95*  CO2 29  GLUCOSE 166*  BUN 12  CREATININE 1.19*  CALCIUM 9.1   GFR: Estimated Creatinine Clearance: 45.8 mL/min (by C-G formula based on SCr of 1.19 mg/dL (H)).  Cardiac Enzymes:  Recent Labs Lab 10/10/16 1622  TROPONINI 0.06*   Urine analysis:    Component Value Date/Time   COLORURINE YELLOW 10/10/2016 1730   APPEARANCEUR CLEAR 10/10/2016 1730   LABSPEC 1.020 10/10/2016 1730    PHURINE 5.5 10/10/2016 1730   GLUCOSEU NEGATIVE 10/10/2016 1730   HGBUR NEGATIVE 10/10/2016 1730   BILIRUBINUR NEGATIVE 10/10/2016 1730   KETONESUR NEGATIVE 10/10/2016 1730   PROTEINUR NEGATIVE 10/10/2016 1730   UROBILINOGEN 0.2 04/04/2013 2238   NITRITE NEGATIVE 10/10/2016 1730   LEUKOCYTESUR SMALL (A) 10/10/2016 1730    Radiological Exams on Admission: Dg Chest 2 View  Result Date: 10/10/2016 CLINICAL DATA:  Shortness of breath. EXAM: CHEST  2 VIEW COMPARISON:  Chest x-ray dated 06/12/2010 FINDINGS: The patient has small bilateral pleural effusions with bilateral interstitial pulmonary edema. Pulmonary  vascularity is normal and the heart size is within normal limits. Calcification and tortuosity of the thoracic aorta. No acute bone abnormality. IMPRESSION: Findings consistent with slight interstitial edema and small bilateral pleural effusions of unknown etiology. Electronically Signed   By: Lorriane Shire M.D.   On: 10/10/2016 17:19    EKG: Independently reviewed. Sinus tachycardia  Assessment/Plan Principal Problem:   SOB (shortness of breath) Active Problems:   ANXIETY DEPRESSION   HTN (hypertension)   Unifocal PVCs   GERD (gastroesophageal reflux disease)   Troponin level elevated   1. SOB. Etiology is unclear at this point. I suspect this could be asthma exacerbation from her GERD. I also think this could be from anxiety. CXR shows findings consistent with slight interstitial edema and small bilateral pleural effusions of unknown etiology. Since she is on Arimidex and her troponin is slightly elevated, will order a CTA to r/o PE. Will continue on metoprolol and ASA. Will also continue her albuterol and alka seltzer. Continue to monitor.  Will Tx her GERD aggressively.  I don't think she has cardiac ischemia.  Will obtain cardiac ECHO. 2. Unifocal PVC:  Will continue with low dose Lopressor for symptomatic Tx. 3. Elevated troponin:  Only slightly elevated.  Will check ECHO and  trend troponin.  R/out PE.  4. Prolonged QT interval:  Will d/c SSRI.  Check magnesium.  Repeat EKG to see if corrected.  Avoid meds causing prolongation of QT interval.  5. HTN. Slightly elevated. Continue Lopressor. Monitor carefully. 6. DM type 2. Stable. Continue Amaryl.  Will use SSI as needed.  7. Depression. Continue Celexa. 8. Anxiety. Noted. Continue Xanax.  DVT prophylaxis: Heparin Code Status: Full Family Communication: Sister at bedside Disposition Plan: Discharge once improved Consults called: None Admission status: Admit to observation   Orvan Falconer, MD    FACP [facility] If 7PM-7AM, please contact night-coverage [email or web address] Password [email or web address]  10/10/2016, 8:04 PM   By signing my name below, I, Hilbert Odor, attest that this documentation has been prepared under the direction and in the presence of Orvan Falconer, MD. Electronically signed: Hilbert Odor, Scribe.  10/10/16, 6:45 PM

## 2016-10-10 NOTE — ED Provider Notes (Signed)
[facility] DEPT Provider Note   CSN: [id number] Arrival date & time: 10/10/16  1548     History   Chief Complaint Chief Complaint  Patient presents with  . Shortness of Breath    HPI Kristen Garner is a 65 y.o. female.  HPI  Pt was seen at 1600.  Per pt, c/o gradual onset and worsening of persistent SOB for the past 4 days. Has been associated with no other symptoms. States others in the household had similar symptoms this past week, unknown dx. Denies CP/palpitations, no back pain, no abd pain, no N/V/D, no fevers, no rash.    Past Medical History:  Diagnosis Date  . Anxiety   . Breast cancer ([place])    right  . Depression   . Diabetes mellitus    takes only the pill  . Headache(784.0)    occiasional migraine  . Hypertension   . Irregular heart beat   . Noncompliance 01/04/2015  . Osteopenia 01/04/2015  . Skin cancer 2015   right leg    Patient Active Problem List   Diagnosis Date Noted  . Osteopenia 01/04/2015  . Noncompliance 01/04/2015  . UTI (urinary tract infection) 04/05/2013  . Acute renal failure ([place]) 04/04/2013  . Diarrhea 04/04/2013  . Dehydration 04/04/2013  . Hypokalemia 04/04/2013  . Hyponatremia 04/04/2013  . Metabolic acidosis 40/98/1191  . Leucocytosis 04/04/2013  . Diabetes ([place]) 04/04/2013  . HTN (hypertension) 04/04/2013  . Infiltrating ductal carcinoma of breast ([place]) 10/05/2012  . HEARTBURN 08/23/2010  . ANXIETY DEPRESSION 07/15/2010  . ATRIAL TACHYCARDIA 07/15/2010    Past Surgical History:  Procedure Laterality Date  . ABDOMINAL HYSTERECTOMY    . BREAST SURGERY     biopsy then wide excision   . CHOLECYSTECTOMY    . SENTINEL LYMPH NODE BIOPSY    . SKIN CANCER EXCISION Right 2015    OB History    No data available       Home Medications    Prior to Admission medications   Medication Sig Start Date End Date Taking? Authorizing Provider  albuterol (PROVENTIL HFA;VENTOLIN HFA) 108 (90 BASE) MCG/ACT inhaler Inhale  2 puffs into the lungs every 6 (six) hours as needed.    Historical Provider, MD  anastrozole (ARIMIDEX) 1 MG tablet Take 1 tablet (1 mg total) by mouth daily. 01/04/15   Baird Cancer, PA-C  citalopram (CELEXA) 20 MG tablet Take 40 mg by mouth daily.     Historical Provider, MD  glimepiride (AMARYL) 4 MG tablet Take 4 mg by mouth daily before breakfast.    Historical Provider, MD  metoprolol (LOPRESSOR) 50 MG tablet Take 1 tablet (50 mg total) by mouth 2 (two) times daily. 09/11/11   Deboraha Sprang, MD  ondansetron (ZOFRAN) 4 MG tablet Take 4 mg by mouth every 6 (six) hours as needed for nausea.    Historical Provider, MD  potassium chloride SA (K-DUR,KLOR-CON) 20 MEQ tablet Take 1 tablet (20 mEq total) by mouth 2 (two) times daily. 01/04/15   Baird Cancer, PA-C  sodium bicarbonate 650 MG tablet Take 2 tablets (1,300 mg total) by mouth 3 (three) times daily. 04/06/13   Kathie Dike, MD  zolpidem (AMBIEN) 10 MG tablet Take 10 mg by mouth at bedtime.    Historical Provider, MD    Family History Family History  Problem Relation Age of Onset  . Diabetes Father   . Stroke Maternal Grandmother     Social History Social History  Substance Use Topics  .  Smoking status: Former Research scientist (life sciences)  . Smokeless tobacco: Never Used  . Alcohol use No     Allergies   Codeine; Penicillins; Sulfonamide derivatives; and Iodine   Review of Systems Review of Systems ROS: Statement: All systems negative except as marked or noted in the HPI; Constitutional: Negative for fever and chills. ; ; Eyes: Negative for eye pain, redness and discharge. ; ; ENMT: Negative for ear pain, hoarseness, nasal congestion, sinus pressure and sore throat. ; ; Cardiovascular: +SOB. Negative for chest pain, palpitations, diaphoresis, and peripheral edema. ; ; Respiratory: Negative for cough, wheezing and stridor. ; ; Gastrointestinal: Negative for nausea, vomiting, diarrhea, abdominal pain, blood in stool, hematemesis, jaundice and  rectal bleeding. . ; ; Genitourinary: Negative for dysuria, flank pain and hematuria. ; ; Musculoskeletal: Negative for back pain and neck pain. Negative for swelling and trauma.; ; Skin: Negative for pruritus, rash, abrasions, blisters, bruising and skin lesion.; ; Neuro: Negative for headache, lightheadedness and neck stiffness. Negative for weakness, altered level of consciousness, altered mental status, extremity weakness, paresthesias, involuntary movement, seizure and syncope.      Physical Exam Updated Vital Signs BP (!) 150/107 (BP Location: Left Arm)   Pulse 103   Temp 98.1 F (36.7 C) (Oral)   Resp 18   Ht 5\' 2"  (1.575 m)   Wt 174 lb (78.9 kg)   SpO2 97%   BMI 31.83 kg/m   BP 160/92 (BP Location: Left Arm)   Pulse 99   Temp 98.1 F (36.7 C)   Resp 18   Ht 5\' 2"  (1.575 m)   Wt 174 lb (78.9 kg)   SpO2 96%   BMI 31.83 kg/m    Physical Exam 1605: Physical examination:  Nursing notes reviewed; Vital signs and O2 SAT reviewed;  Constitutional: Well developed, Well nourished, Well hydrated, In no acute distress; Head:  Normocephalic, atraumatic; Eyes: EOMI, PERRL, No scleral icterus; ENMT: Mouth and pharynx normal, Mucous membranes moist; Neck: Supple, Full range of motion, No lymphadenopathy; Cardiovascular: Tachycardic rate and rhythm, No gallop; Respiratory: Breath sounds coarse & equal bilaterally, No wheezes.  Speaking full sentences with ease, Normal respiratory effort/excursion; Chest: Nontender, Movement normal; Abdomen: Soft, Nontender, Nondistended, Normal bowel sounds; Genitourinary: No CVA tenderness; Extremities: Pulses normal, No tenderness, No edema, No calf edema or asymmetry.; Neuro: AA&Ox3, Major CN grossly intact.  Speech clear. No gross focal motor or sensory deficits in extremities. Climbs on and off stretcher easily by herself. Gait steady.; Skin: Color normal, Warm, Dry.   ED Treatments / Results  Labs (all labs ordered are listed, but only abnormal  results are displayed)   EKG  EKG Interpretation  Date/Time:  Friday October 10 2016 16:19:25 EST Ventricular Rate:  104 PR Interval:    QRS Duration: 113 QT Interval:  381 QTC Calculation: 502 R Axis:   86 Text Interpretation:  Sinus tachycardia Multiple ventricular premature complexes Nonspecific ST and T wave abnormality Prolonged QT interval When compared with ECG of 05/07/2012, 07/19/2010 No significant change was found Confirmed by [facility]  MD, Nunzio Cory [phone]) on 10/10/2016 4:38:29 PM       Radiology   Procedures Procedures (including critical care time)  Medications Ordered in ED Medications  albuterol (PROVENTIL) (2.5 MG/3ML) 0.083% nebulizer solution 2.5 mg (not administered)  ipratropium-albuterol (DUONEB) 0.5-2.5 (3) MG/3ML nebulizer solution 3 mL (not administered)     Initial Impression / Assessment and Plan / ED Course  I have reviewed the triage vital signs and the nursing notes.  Pertinent labs & imaging results that were available during my care of the patient were reviewed by me and considered in my medical decision making (see chart for details).  MDM Reviewed: previous chart, nursing note and vitals Reviewed previous: labs and ECG Interpretation: labs, x-ray and ECG   Results for orders placed or performed during the hospital encounter of 12/75/17  Basic metabolic panel  Result Value Ref Range   Sodium 136 135 - 145 mmol/L   Potassium 3.4 (L) 3.5 - 5.1 mmol/L   Chloride 95 (L) 101 - 111 mmol/L   CO2 29 22 - 32 mmol/L   Glucose, Bld 166 (H) 65 - 99 mg/dL   BUN 12 6 - 20 mg/dL   Creatinine, Ser 1.19 (H) 0.44 - 1.00 mg/dL   Calcium 9.1 8.9 - 10.3 mg/dL   GFR calc non Af Amer 47 (L) >60 mL/min   GFR calc Af Amer 54 (L) >60 mL/min   Anion gap 12 5 - 15  Troponin I  Result Value Ref Range   Troponin I 0.06 (HH) <0.03 ng/mL  Lactic acid, plasma  Result Value Ref Range   Lactic Acid, Venous 1.6 0.5 - 1.9 mmol/L  Brain natriuretic peptide    Result Value Ref Range   B Natriuretic Peptide 496.0 (H) 0.0 - 100.0 pg/mL  CBC with Differential  Result Value Ref Range   WBC 8.4 4.0 - 10.5 K/uL   RBC 4.60 3.87 - 5.11 MIL/uL   Hemoglobin 13.8 12.0 - 15.0 g/dL   HCT 40.8 36.0 - 46.0 %   MCV 88.7 78.0 - 100.0 fL   MCH 30.0 26.0 - 34.0 pg   MCHC 33.8 30.0 - 36.0 g/dL   RDW 14.0 11.5 - 15.5 %   Platelets 269 150 - 400 K/uL   Neutrophils Relative % 79 %   Neutro Abs 6.7 1.7 - 7.7 K/uL   Lymphocytes Relative 13 %   Lymphs Abs 1.1 0.7 - 4.0 K/uL   Monocytes Relative 5 %   Monocytes Absolute 0.4 0.1 - 1.0 K/uL   Eosinophils Relative 2 %   Eosinophils Absolute 0.2 0.0 - 0.7 K/uL   Basophils Relative 1 %   Basophils Absolute 0.0 0.0 - 0.1 K/uL  D-dimer, quantitative  Result Value Ref Range   D-Dimer, Quant 0.46 0.00 - 0.50 ug/mL-FEU   Dg Chest 2 View Result Date: 10/10/2016 CLINICAL DATA:  Shortness of breath. EXAM: CHEST  2 VIEW COMPARISON:  Chest x-ray dated 06/12/2010 FINDINGS: The patient has small bilateral pleural effusions with bilateral interstitial pulmonary edema. Pulmonary vascularity is normal and the heart size is within normal limits. Calcification and tortuosity of the thoracic aorta. No acute bone abnormality. IMPRESSION: Findings consistent with slight interstitial edema and small bilateral pleural effusions of unknown etiology. Electronically Signed   By: Lorriane Shire M.D.   On: 10/10/2016 17:19    1825: Troponin and BNP elevated; denies CP. Will observation admit. T/C to [facility] Dr. Marin Comment, case discussed, including:  HPI, pertinent PM/SHx, VS/PE, dx testing, ED course and treatment:  Agreeable to admit, requests to order CT-A chest, and he will come to the ED for evaluation.    Final Clinical Impressions(s) / ED Diagnoses   Final diagnoses:  None    New Prescriptions New Prescriptions   No medications on file     Francine Graven, DO 10/13/16 2157

## 2016-10-10 NOTE — ED Notes (Signed)
Went to check vitals Dr Marin Comment in with patient

## 2016-10-10 NOTE — ED Notes (Signed)
Patient transported to X-ray 

## 2016-10-11 ENCOUNTER — Observation Stay (HOSPITAL_COMMUNITY): Payer: PPO

## 2016-10-11 DIAGNOSIS — I471 Supraventricular tachycardia: Secondary | ICD-10-CM | POA: Diagnosis not present

## 2016-10-11 DIAGNOSIS — I2582 Chronic total occlusion of coronary artery: Secondary | ICD-10-CM | POA: Diagnosis not present

## 2016-10-11 DIAGNOSIS — I25110 Atherosclerotic heart disease of native coronary artery with unstable angina pectoris: Secondary | ICD-10-CM | POA: Diagnosis not present

## 2016-10-11 DIAGNOSIS — Z885 Allergy status to narcotic agent status: Secondary | ICD-10-CM | POA: Diagnosis not present

## 2016-10-11 DIAGNOSIS — I5043 Acute on chronic combined systolic (congestive) and diastolic (congestive) heart failure: Secondary | ICD-10-CM | POA: Diagnosis not present

## 2016-10-11 DIAGNOSIS — E876 Hypokalemia: Secondary | ICD-10-CM | POA: Diagnosis not present

## 2016-10-11 DIAGNOSIS — Z853 Personal history of malignant neoplasm of breast: Secondary | ICD-10-CM | POA: Diagnosis not present

## 2016-10-11 DIAGNOSIS — J45909 Unspecified asthma, uncomplicated: Secondary | ICD-10-CM | POA: Diagnosis not present

## 2016-10-11 DIAGNOSIS — I10 Essential (primary) hypertension: Secondary | ICD-10-CM | POA: Diagnosis not present

## 2016-10-11 DIAGNOSIS — E119 Type 2 diabetes mellitus without complications: Secondary | ICD-10-CM | POA: Diagnosis not present

## 2016-10-11 DIAGNOSIS — I4581 Long QT syndrome: Secondary | ICD-10-CM | POA: Diagnosis not present

## 2016-10-11 DIAGNOSIS — R0600 Dyspnea, unspecified: Secondary | ICD-10-CM | POA: Diagnosis not present

## 2016-10-11 DIAGNOSIS — F418 Other specified anxiety disorders: Secondary | ICD-10-CM | POA: Diagnosis not present

## 2016-10-11 DIAGNOSIS — E1122 Type 2 diabetes mellitus with diabetic chronic kidney disease: Secondary | ICD-10-CM | POA: Diagnosis not present

## 2016-10-11 DIAGNOSIS — Z7982 Long term (current) use of aspirin: Secondary | ICD-10-CM | POA: Diagnosis not present

## 2016-10-11 DIAGNOSIS — Z7984 Long term (current) use of oral hypoglycemic drugs: Secondary | ICD-10-CM | POA: Diagnosis not present

## 2016-10-11 DIAGNOSIS — N170 Acute kidney failure with tubular necrosis: Secondary | ICD-10-CM | POA: Diagnosis not present

## 2016-10-11 DIAGNOSIS — R748 Abnormal levels of other serum enzymes: Secondary | ICD-10-CM | POA: Diagnosis not present

## 2016-10-11 DIAGNOSIS — N179 Acute kidney failure, unspecified: Secondary | ICD-10-CM | POA: Diagnosis not present

## 2016-10-11 DIAGNOSIS — I25118 Atherosclerotic heart disease of native coronary artery with other forms of angina pectoris: Secondary | ICD-10-CM | POA: Diagnosis not present

## 2016-10-11 DIAGNOSIS — R0602 Shortness of breath: Secondary | ICD-10-CM | POA: Diagnosis not present

## 2016-10-11 DIAGNOSIS — K219 Gastro-esophageal reflux disease without esophagitis: Secondary | ICD-10-CM | POA: Diagnosis not present

## 2016-10-11 DIAGNOSIS — I2510 Atherosclerotic heart disease of native coronary artery without angina pectoris: Secondary | ICD-10-CM | POA: Diagnosis not present

## 2016-10-11 DIAGNOSIS — I5021 Acute systolic (congestive) heart failure: Secondary | ICD-10-CM | POA: Diagnosis not present

## 2016-10-11 DIAGNOSIS — I130 Hypertensive heart and chronic kidney disease with heart failure and stage 1 through stage 4 chronic kidney disease, or unspecified chronic kidney disease: Secondary | ICD-10-CM | POA: Diagnosis not present

## 2016-10-11 DIAGNOSIS — I110 Hypertensive heart disease with heart failure: Secondary | ICD-10-CM | POA: Diagnosis not present

## 2016-10-11 DIAGNOSIS — Z85828 Personal history of other malignant neoplasm of skin: Secondary | ICD-10-CM | POA: Diagnosis not present

## 2016-10-11 DIAGNOSIS — Z87891 Personal history of nicotine dependence: Secondary | ICD-10-CM | POA: Diagnosis not present

## 2016-10-11 DIAGNOSIS — I5023 Acute on chronic systolic (congestive) heart failure: Secondary | ICD-10-CM | POA: Diagnosis not present

## 2016-10-11 DIAGNOSIS — R590 Localized enlarged lymph nodes: Secondary | ICD-10-CM | POA: Diagnosis not present

## 2016-10-11 DIAGNOSIS — Z79811 Long term (current) use of aromatase inhibitors: Secondary | ICD-10-CM | POA: Diagnosis not present

## 2016-10-11 DIAGNOSIS — N183 Chronic kidney disease, stage 3 (moderate): Secondary | ICD-10-CM | POA: Diagnosis not present

## 2016-10-11 LAB — ECHOCARDIOGRAM COMPLETE
Height: 62 in
WEIGHTICAEL: 2812.8 [oz_av]

## 2016-10-11 LAB — GLUCOSE, CAPILLARY
GLUCOSE-CAPILLARY: 138 mg/dL — AB (ref 65–99)
GLUCOSE-CAPILLARY: 85 mg/dL (ref 65–99)
GLUCOSE-CAPILLARY: 99 mg/dL (ref 65–99)
Glucose-Capillary: 106 mg/dL — ABNORMAL HIGH (ref 65–99)

## 2016-10-11 LAB — TROPONIN I: TROPONIN I: 0.09 ng/mL — AB (ref <0.03)

## 2016-10-11 MED ORDER — NITROGLYCERIN 2 % TD OINT
0.5000 [in_us] | TOPICAL_OINTMENT | Freq: Four times a day (QID) | TRANSDERMAL | Status: DC
  Administered 2016-10-11 – 2016-10-12 (×5): 0.5 [in_us] via TOPICAL
  Filled 2016-10-11 (×5): qty 1

## 2016-10-11 MED ORDER — MAGNESIUM SULFATE IN D5W 1-5 GM/100ML-% IV SOLN
1.0000 g | Freq: Once | INTRAVENOUS | Status: AC
  Administered 2016-10-11: 1 g via INTRAVENOUS
  Filled 2016-10-11: qty 100

## 2016-10-11 MED ORDER — MAGNESIUM SULFATE 2 GM/50ML IV SOLN
2.0000 g | Freq: Once | INTRAVENOUS | Status: AC
  Administered 2016-10-11: 2 g via INTRAVENOUS
  Filled 2016-10-11: qty 50

## 2016-10-11 MED ORDER — MAGNESIUM SULFATE 50 % IJ SOLN: 3.0000 g | Freq: Once | INTRAVENOUS | Status: DC

## 2016-10-11 MED ORDER — FUROSEMIDE 10 MG/ML IJ SOLN
40.0000 mg | Freq: Once | INTRAMUSCULAR | Status: AC
  Administered 2016-10-11: 40 mg via INTRAVENOUS
  Filled 2016-10-11: qty 4

## 2016-10-11 MED ORDER — FUROSEMIDE 10 MG/ML IJ SOLN
40.0000 mg | Freq: Two times a day (BID) | INTRAMUSCULAR | Status: DC
  Administered 2016-10-11 – 2016-10-12 (×2): 40 mg via INTRAVENOUS
  Filled 2016-10-11 (×2): qty 4

## 2016-10-11 MED ORDER — MAGNESIUM SULFATE 2 GM/50ML IV SOLN
2.0000 g | Freq: Once | INTRAVENOUS | Status: DC
Start: 2016-10-11 — End: 2016-10-11

## 2016-10-11 NOTE — Progress Notes (Addendum)
CRITICAL VALUE ALERT  Critical value received:  Troponin 0.09  Date of notification:  Was not notified read results 10/11/16  Time of notification:  Was not notified read results 0344  Critical value read back:No. read results  Nurse who received alert:  Ronne Binning RN  MD notified (1st page):  Eleonore Chiquito  Time of first page:  [phone]  MD notified (2nd page):  Time of second page:  Responding MD:  Eleonore Chiquito  Time MD responded:  [phone]

## 2016-10-11 NOTE — Progress Notes (Signed)
*  PRELIMINARY RESULTS* Echocardiogram 2D Echocardiogram has been performed.  Beryle Beams 10/11/2016, 11:15 AM

## 2016-10-11 NOTE — Progress Notes (Addendum)
PROGRESS NOTE                                                                                                                                                                                                             Patient Demographics:    Kristen Garner, is a 65 y.o. female, DOB - 10/29/1951, CZY:[id number]  Admit date - 10/10/2016   Admitting Physician Orvan Falconer, MD  Outpatient Primary MD for the patient is Purvis Kilts, MD  LOS - 0  Chief Complaint  Patient presents with  . Shortness of Breath       Brief Narrative Kristen Garner is a 65 y.o. female with medical history significant of HLD, anxiety, depression, DM type 2, and HTN, presents to the ED with complaints of worsening SOB for the past 4 days. She reports that when she lays down at night, her SOB is worsened. She came To the ER where her BNP was elevated, chest x-ray showed interstitial edema and she was diagnosed with CHF and admitted to the hospital.   Subjective:    Kristen Garner today has, No headache, No chest pain, No abdominal pain - No Nausea, No new weakness tingling or numbness, No Cough - improved SOB.     Assessment  & Plan :    1. Acute on chronic nonspecific CHF. No previous echo. Obtain echocardiogram, IV Lasix and Nitropaste, continue beta blocker, fluid and salt restriction, daily weights, monitor intake and output. Supportive care with oxygen and nebulizer treatments if needed.  2. Hypertension and PVCs on EKG. Continue beta blocker monitor electrolytes. Symptom-free. Check echocardiogram to evaluate EF and wall motion.  3. Mildly elevated troponin. Trend is flat and in non - ACS pattern, EKG Shows some flipped T waves in lateral leads, she is chest pain-free, continue aspirin, continue beta blocker, check echocardiogram. If she develops any chest pain we'll place her on heparin drip, will have cardiology see her on Monday.  Discussed her case with cardiologist on call Dr. Stanford Breed who agrees with the plan.  4. Mildly prolonged QTC. Replace potassium, SSRI has been held for now, repeat EKG. If QTC stable we will restart SSRI cautiously with outpatient follow-up with PCP.  5. Anxiety depression. Continue Xanax as needed, SSRI as in number for above.  6. GERD. On PPI.  7. Mediastinal lymphadenopathy -  outpt PCP follow-up.  8. DM type II. On Amaryl and sliding scale.  Lab Results  Component Value Date   HGBA1C 8.2 (H) 04/04/2013   CBG (last 3)   Recent Labs  10/10/16 2050 10/11/16 0747  GLUCAP 155* 138*      Family Communication  :  None  Code Status :  Full  Diet : Diet Carb Modified Fluid consistency: Thin; Room service appropriate? Yes; Fluid restriction: 1200 mL Fluid   Disposition Plan  :  Home 1-2days  Consults  :  None  Procedures  :   TTE  DVT Prophylaxis  :    Heparin    Lab Results  Component Value Date   PLT 269 10/10/2016    Inpatient Medications  Scheduled Meds: . albuterol  2.5 mg Nebulization Q6H  . albuterol      . anastrozole  1 mg Oral Daily  . aspirin EC  81 mg Oral Daily  . furosemide  40 mg Intravenous BID  . glimepiride  4 mg Oral QAC breakfast  . heparin  5,000 Units Subcutaneous Q8H  . insulin aspart  0-15 Units Subcutaneous TID WC  . insulin aspart  0-5 Units Subcutaneous QHS  . metoprolol  50 mg Oral BID  . nitroGLYCERIN  0.5 inch Topical Q6H  . pantoprazole  40 mg Oral BID AC  . potassium chloride SA  20 mEq Oral BID  . zolpidem  5 mg Oral QHS   Continuous Infusions: PRN Meds:.albuterol, LORazepam, ondansetron **OR** ondansetron (ZOFRAN) IV  Antibiotics  :    Anti-infectives    None         Objective:   Vitals:   10/10/16 2103 10/11/16 0136 10/11/16 0346 10/11/16 0802  BP: (!) 155/75  127/83   Pulse: (!) 105  89   Resp: 20  20   Temp: 98.8 F (37.1 C)  98.9 F (37.2 C)   TempSrc: Oral  Oral   SpO2: 99% 95% 94% 93%  Weight:  79.7 kg (175 lb 12.8 oz)     Height: 5\' 2"  (1.575 m)       Wt Readings from Last 3 Encounters:  10/10/16 79.7 kg (175 lb 12.8 oz)  01/04/15 86.6 kg (190 lb 14.4 oz)  04/04/13 87.1 kg (192 lb)    No intake or output data in the 24 hours ending 10/11/16 0825   Physical Exam  Awake Alert, Oriented X 3, No new F.N deficits, Normal affect [place].AT,PERRAL Supple Neck,No JVD, No cervical lymphadenopathy appriciated.  Symmetrical Chest wall movement, Good air movement bilaterally, +ve rales RRR,No Gallops,Rubs or new Murmurs, No Parasternal Heave +ve B.Sounds, Abd Soft, No tenderness, No organomegaly appriciated, No rebound - guarding or rigidity. No Cyanosis, Clubbing or edema, No new Rash or bruise       Data Review:    CBC  Recent Labs Lab 10/10/16 1622  WBC 8.4  HGB 13.8  HCT 40.8  PLT 269  MCV 88.7  MCH 30.0  MCHC 33.8  RDW 14.0  LYMPHSABS 1.1  MONOABS 0.4  EOSABS 0.2  BASOSABS 0.0    Chemistries   Recent Labs Lab 10/10/16 1622  NA 136  K 3.4*  CL 95*  CO2 29  GLUCOSE 166*  BUN 12  CREATININE 1.19*  CALCIUM 9.1  MG 1.5*   ------------------------------------------------------------------------------------------------------------------ No results for input(s): CHOL, HDL, LDLCALC, TRIG, CHOLHDL, LDLDIRECT in the last 72 hours.  Lab Results  Component Value Date   HGBA1C 8.2 (H) 04/04/2013   ------------------------------------------------------------------------------------------------------------------  Recent Labs  10/10/16 2156  TSH 2.285   ------------------------------------------------------------------------------------------------------------------ No results for input(s): VITAMINB12, FOLATE, FERRITIN, TIBC, IRON, RETICCTPCT in the last 72 hours.  Coagulation profile No results for input(s): INR, PROTIME in the last 168 hours.   Recent Labs  10/10/16 1622  DDIMER 0.46    Cardiac Enzymes  Recent Labs Lab 10/10/16 1622  10/10/16 2156 10/11/16 0239  TROPONINI 0.06* 0.08* 0.09*   ------------------------------------------------------------------------------------------------------------------    Component Value Date/Time   BNP 496.0 (H) 10/10/2016 1622    Micro Results No results found for this or any previous visit (from the past 240 hour(s)).  Radiology Reports Dg Chest 2 View  Result Date: 10/10/2016 CLINICAL DATA:  Shortness of breath. EXAM: CHEST  2 VIEW COMPARISON:  Chest x-ray dated 06/12/2010 FINDINGS: The patient has small bilateral pleural effusions with bilateral interstitial pulmonary edema. Pulmonary vascularity is normal and the heart size is within normal limits. Calcification and tortuosity of the thoracic aorta. No acute bone abnormality. IMPRESSION: Findings consistent with slight interstitial edema and small bilateral pleural effusions of unknown etiology. Electronically Signed   By: Lorriane Shire M.D.   On: 10/10/2016 17:19   Ct Angio Chest Pe W/cm &/or Wo Cm  Result Date: 10/10/2016 CLINICAL DATA:  Acute onset of shortness of breath. Initial encounter. EXAM: CT ANGIOGRAPHY CHEST WITH CONTRAST TECHNIQUE: Multidetector CT imaging of the chest was performed using the standard protocol during bolus administration of intravenous contrast. Multiplanar CT image reconstructions and MIPs were obtained to evaluate the vascular anatomy. CONTRAST:  100 mL of Isovue 370 IV contrast COMPARISON:  Chest radiograph performed earlier today at 5:04 p.m. FINDINGS: Cardiovascular:  There is no evidence of pulmonary embolus. Mild left atrial enlargement is noted. Scattered coronary artery calcification is seen. Mild calcification is noted along the aortic arch. The great vessels are grossly unremarkable in appearance. Mediastinum/Nodes: A 1.4 cm aortopulmonary window node is seen. No additional mediastinal lymphadenopathy appreciated. No pericardial effusion is identified. The thyroid gland is grossly  unremarkable. No axillary lymphadenopathy is appreciated. Lungs/Pleura: Small bilateral pleural effusions are noted. Underlying interstitial prominence is seen, with mild bibasilar opacities, likely reflecting pulmonary edema. No pneumothorax is identified. No dominant masses are seen. Upper Abdomen: The visualized portions of the liver and spleen are unremarkable. The visualized portions of the pancreas and adrenal glands are within normal limits. Musculoskeletal: No acute osseous abnormalities are identified. The visualized musculature is unremarkable in appearance. Review of the MIP images confirms the above findings. IMPRESSION: 1. No evidence of pulmonary embolus. 2. Small bilateral pleural effusions. Underlying interstitial prominence, with mild bibasilar opacities, likely reflecting pulmonary edema. 3. Mild left atrial enlargement noted. 4. Scattered coronary artery calcifications seen. 5. 1.4 cm aortopulmonary window node seen, of uncertain significance. Electronically Signed   By: Garald Balding M.D.   On: 10/10/2016 20:52    Time Spent in minutes  30   Jermy Couper K M.D on 10/11/2016 at 8:25 AM  Between 7am to 7pm - Pager - [phone]  After 7pm go to [email or web address] - password [facility]  [facility] -  Office  [phone]

## 2016-10-12 DIAGNOSIS — R7989 Other specified abnormal findings of blood chemistry: Secondary | ICD-10-CM

## 2016-10-12 DIAGNOSIS — I5043 Acute on chronic combined systolic (congestive) and diastolic (congestive) heart failure: Secondary | ICD-10-CM | POA: Diagnosis present

## 2016-10-12 DIAGNOSIS — R748 Abnormal levels of other serum enzymes: Secondary | ICD-10-CM

## 2016-10-12 DIAGNOSIS — I5021 Acute systolic (congestive) heart failure: Secondary | ICD-10-CM

## 2016-10-12 DIAGNOSIS — R778 Other specified abnormalities of plasma proteins: Secondary | ICD-10-CM | POA: Diagnosis present

## 2016-10-12 DIAGNOSIS — I10 Essential (primary) hypertension: Secondary | ICD-10-CM

## 2016-10-12 DIAGNOSIS — R0602 Shortness of breath: Secondary | ICD-10-CM

## 2016-10-12 LAB — GLUCOSE, CAPILLARY
GLUCOSE-CAPILLARY: 85 mg/dL (ref 65–99)
GLUCOSE-CAPILLARY: 75 mg/dL (ref 65–99)
Glucose-Capillary: 93 mg/dL (ref 65–99)
Glucose-Capillary: 135 mg/dL — ABNORMAL HIGH (ref 65–99)

## 2016-10-12 LAB — BASIC METABOLIC PANEL
ANION GAP: 11 (ref 5–15)
Anion gap: 11 (ref 5–15)
BUN: 13 mg/dL (ref 6–20)
BUN: 13 mg/dL (ref 6–20)
CALCIUM: 8.2 mg/dL — AB (ref 8.9–10.3)
CHLORIDE: 99 mmol/L — AB (ref 101–111)
CHLORIDE: 100 mmol/L — AB (ref 101–111)
CO2: 29 mmol/L (ref 22–32)
CO2: 25 mmol/L (ref 22–32)
Calcium: 8.1 mg/dL — ABNORMAL LOW (ref 8.9–10.3)
Creatinine, Ser: 1.31 mg/dL — ABNORMAL HIGH (ref 0.44–1.00)
Creatinine, Ser: 1.61 mg/dL — ABNORMAL HIGH (ref 0.44–1.00)
GFR calc Af Amer: 38 mL/min — ABNORMAL LOW (ref >60)
GFR calc non Af Amer: 42 mL/min — ABNORMAL LOW (ref >60)
GFR calc non Af Amer: 33 mL/min — ABNORMAL LOW (ref >60)
GFR, EST AFRICAN AMERICAN: 48 mL/min — AB (ref >60)
GLUCOSE: 77 mg/dL (ref 65–99)
Glucose, Bld: 75 mg/dL (ref 65–99)
POTASSIUM: 2.6 mmol/L — AB (ref 3.5–5.1)
POTASSIUM: 3.5 mmol/L (ref 3.5–5.1)
SODIUM: 136 mmol/L (ref 135–145)
Sodium: 139 mmol/L (ref 135–145)

## 2016-10-12 LAB — MAGNESIUM: Magnesium: 2.1 mg/dL (ref 1.7–2.4)

## 2016-10-12 LAB — HEMOGLOBIN A1C
HEMOGLOBIN A1C: 8.5 % — AB (ref 4.8–5.6)
Mean Plasma Glucose: 197 mg/dL

## 2016-10-12 MED ORDER — SODIUM CHLORIDE 0.9% FLUSH
3.0000 mL | Freq: Two times a day (BID) | INTRAVENOUS | Status: DC
  Administered 2016-10-12: 3 mL via INTRAVENOUS

## 2016-10-12 MED ORDER — ATORVASTATIN CALCIUM 80 MG PO TABS
80.0000 mg | ORAL_TABLET | Freq: Every day | ORAL | Status: DC
  Administered 2016-10-12 – 2016-10-15 (×4): 80 mg via ORAL
  Filled 2016-10-12 (×4): qty 1

## 2016-10-12 MED ORDER — ASPIRIN 81 MG PO CHEW
81.0000 mg | CHEWABLE_TABLET | ORAL | Status: AC
  Administered 2016-10-13: 81 mg via ORAL
  Filled 2016-10-12: qty 1

## 2016-10-12 MED ORDER — INSULIN GLARGINE 100 UNIT/ML ~~LOC~~ SOLN
15.0000 [IU] | Freq: Every day | SUBCUTANEOUS | Status: DC
  Administered 2016-10-15 – 2016-10-16 (×2): 15 [IU] via SUBCUTANEOUS
  Filled 2016-10-12 (×5): qty 0.15

## 2016-10-12 MED ORDER — ISOSORB DINITRATE-HYDRALAZINE 20-37.5 MG PO TABS
1.0000 | ORAL_TABLET | Freq: Three times a day (TID) | ORAL | Status: DC
  Administered 2016-10-12 – 2016-10-16 (×11): 1 via ORAL
  Filled 2016-10-12 (×17): qty 1

## 2016-10-12 MED ORDER — ASPIRIN EC 81 MG PO TBEC
81.0000 mg | DELAYED_RELEASE_TABLET | Freq: Every day | ORAL | Status: DC
  Administered 2016-10-15 – 2016-10-16 (×2): 81 mg via ORAL
  Filled 2016-10-12 (×2): qty 1

## 2016-10-12 MED ORDER — POTASSIUM CHLORIDE CRYS ER 20 MEQ PO TBCR
40.0000 meq | EXTENDED_RELEASE_TABLET | Freq: Four times a day (QID) | ORAL | Status: AC
  Administered 2016-10-12 (×2): 40 meq via ORAL
  Filled 2016-10-12 (×2): qty 2

## 2016-10-12 MED ORDER — FUROSEMIDE 40 MG PO TABS: 40.0000 mg | ORAL_TABLET | Freq: Every day | ORAL | Status: DC

## 2016-10-12 MED ORDER — FUROSEMIDE 40 MG PO TABS
40.0000 mg | ORAL_TABLET | Freq: Every day | ORAL | Status: DC
  Filled 2016-10-12: qty 1

## 2016-10-12 MED ORDER — SODIUM CHLORIDE 0.9% FLUSH: 3.0000 mL | INTRAVENOUS | Status: DC | PRN

## 2016-10-12 MED ORDER — SODIUM CHLORIDE 0.9 % IV SOLN
INTRAVENOUS | Status: DC
  Administered 2016-10-13: 06:00:00 via INTRAVENOUS

## 2016-10-12 MED ORDER — POTASSIUM CHLORIDE 10 MEQ/100ML IV SOLN
10.0000 meq | INTRAVENOUS | Status: AC
  Administered 2016-10-12 (×6): 10 meq via INTRAVENOUS
  Filled 2016-10-12 (×3): qty 100

## 2016-10-12 MED ORDER — SODIUM CHLORIDE 0.9 % IV SOLN: 250.0000 mL | INTRAVENOUS | Status: DC | PRN

## 2016-10-12 MED ORDER — CARVEDILOL 3.125 MG PO TABS
6.2500 mg | ORAL_TABLET | Freq: Two times a day (BID) | ORAL | Status: DC
  Administered 2016-10-12 – 2016-10-14 (×4): 6.25 mg via ORAL
  Filled 2016-10-12: qty 2
  Filled 2016-10-12 (×4): qty 1

## 2016-10-12 NOTE — Progress Notes (Addendum)
PROGRESS NOTE                                                                                                                                                                                                             Patient Demographics:    Kristen Garner, is a 65 y.o. female, DOB - 05-Apr-1951, WNU:[id number]  Admit date - 10/10/2016   Admitting Physician Orvan Falconer, MD  Outpatient Primary MD for the patient is Purvis Kilts, MD  LOS - 1  Chief Complaint  Patient presents with  . Shortness of Breath       Brief Narrative Kristen Garner is a 65 y.o. female with medical history significant of HLD, anxiety, depression, DM type 2, and HTN, presents to the ED with complaints of worsening SOB for the past 4 days. She reports that when she lays down at night, her SOB is worsened. She came To the ER where her BNP was elevated, chest x-ray showed interstitial edema and she was diagnosed with CHF and admitted to the hospital.  Her EKG showed severe lateral T-wave changes, a troponin trend was non-ACS pattern, her case was discussed with cardiologist on call Dr. Stanford Breed on 10/11/2016, she underwent echocardiogram which now shows a severely depressed EF of 25% with diffuse hypokinesis suggestive of multivessel or left main disease, she will now be transferred to [facility] for left heart catheterization and further cardiac workup.    Subjective:    Kristen Garner today has, No headache, No chest pain, No abdominal pain - No Nausea, No new weakness tingling or numbness, No Cough - improved SOB.     Assessment  & Plan :    1. Acute on chronic nonspecific Systolic CHF. EF 25%. She had no previous echo. She is improved and close to her baseline now, creatinine has bumped a little so will switch from IV Lasix to oral once a day, leg edema has resolved and she has minimal rales, no shortness of breath or oxygen demand,  continue Bi-dil, continue beta blocker, PO Lasix, fluid and salt restriction, daily weights, monitor intake and output. Supportive care with oxygen and nebulizer treatments if needed. No ACE/ARB due to mild renal insufficiency have placed her on BiDil instead.  As a #3 below transferred to [facility] for left heart catheterization based on her echocardiogram  and EKG findings.   2. Hypertension and PVCs on EKG. Continue beta blocker, Bidil,  monitor electrolytes. Symptom-free. Check echocardiogram to evaluate EF and wall motion.  3. Mildly elevated troponin. Trend is flat and in non - ACS pattern, EKG Shows some flipped T waves in lateral leads, she is chest pain-free, continue aspirin, continue beta blocker, Check echocardiogram which unfortunately shows severely depressed EF and diffuse wall motion abnormality and hypokinesis suggestive of either multivessel or left main disease, discussed the case with cardiologist on call and PA Richardson Dopp patient will be transferred to [facility] for left heart catheterization.  4. Mildly prolonged QTC. Replace potassium - Magnesium, SSRI has been held for now, repeat EKG. If QTC stable we will restart SSRI cautiously with outpatient follow-up with PCP. On B Blocker.  5. Anxiety depression. Continue Xanax as needed, SSRI as in number for above.  6. GERD. On PPI.  7. Mild AKI - reduced diuretics and monitor renal function.   8. Mediastinal lymphadenopathy - outpt PCP follow-up.  9. Severe hypokalemia. IV and oral replacement ordered, magnesium stable.   10. DM type II. Due to pending left heart catheterization, possible dye exposure, mild renal insufficiency will switch to Lantus from Amaryl to continue sliding scale and monitor CBGs.    Lab Results  Component Value Date   HGBA1C 8.2 (H) 04/04/2013   CBG (last 3)   Recent Labs  10/11/16 1647 10/11/16 2129 10/12/16 0742  GLUCAP 99 106* 85      Family Communication  :  None  Code Status :   Full  Diet : Diet Carb Modified Fluid consistency: Thin; Room service appropriate? Yes; Fluid restriction: 1200 mL Fluid   Disposition Plan  :  Kristen Garner telemetry  Consults  :  Cardiology over the phone  Procedures  :   TTE - Left ventricle: The cavity size was mildly dilated. Wall thickness was increased in a pattern of mild LVH. Systolic function was severely reduced. The estimated ejection fraction was in the range of 20% to 25%. Diffuse hypokinesis. There is akinesis of the inferolateral myocardium. Doppler parameters are consistent with abnormal left ventricular relaxation (grade 1diastolic dysfunction). - Mitral valve: There was mild regurgitation. - Left atrium: The atrium was moderately dilated.  Impressions:  Severe global reduction in LV systolic function; mild LVH and LVE; grade 1 diastolic dysfunction; moderate LAE; mild MR.  DVT Prophylaxis  :    Heparin    Lab Results  Component Value Date   PLT 269 10/10/2016    Inpatient Medications  Scheduled Meds: . albuterol  2.5 mg Nebulization Q6H  . anastrozole  1 mg Oral Daily  . aspirin EC  81 mg Oral Daily  . furosemide  40 mg Oral Daily  . heparin  5,000 Units Subcutaneous Q8H  . insulin aspart  0-15 Units Subcutaneous TID WC  . insulin aspart  0-5 Units Subcutaneous QHS  . insulin glargine  15 Units Subcutaneous Daily  . metoprolol  50 mg Oral BID  . nitroGLYCERIN  0.5 inch Topical Q6H  . pantoprazole  40 mg Oral BID AC  . potassium chloride  10 mEq Intravenous Q1 Hr x 6  . potassium chloride  40 mEq Oral Q6H  . zolpidem  5 mg Oral QHS   Continuous Infusions: PRN Meds:.albuterol, LORazepam, [DISCONTINUED] ondansetron **OR** ondansetron (ZOFRAN) IV  Antibiotics  :    Anti-infectives    None         Objective:   Vitals:  10/12/16 0104 10/12/16 0500 10/12/16 0508 10/12/16 0726  BP:   (!) 140/56   Pulse:   76   Resp:   20   Temp:   98.4 F (36.9 C)   TempSrc:   Oral   SpO2: 91%  95% 91%    Weight:  78.4 kg (172 lb 12.8 oz)    Height:        Wt Readings from Last 3 Encounters:  10/12/16 78.4 kg (172 lb 12.8 oz)  01/04/15 86.6 kg (190 lb 14.4 oz)  04/04/13 87.1 kg (192 lb)     Intake/Output Summary (Last 24 hours) at 10/12/16 0825 Last data filed at 10/11/16 2200  Gross per 24 hour  Intake              150 ml  Output             1200 ml  Net            -1050 ml     Physical Exam  Awake Alert, Oriented X 3, No new F.N deficits, Normal affect [place].AT,PERRAL Supple Neck,No JVD, No cervical lymphadenopathy appriciated.  Symmetrical Chest wall movement, Good air movement bilaterally, +ve rales minimal RRR,No Gallops,Rubs or new Murmurs, No Parasternal Heave +ve B.Sounds, Abd Soft, No tenderness, No organomegaly appriciated, No rebound - guarding or rigidity. No Cyanosis, Clubbing or edema, No new Rash or bruise       Data Review:    CBC  Recent Labs Lab 10/10/16 1622  WBC 8.4  HGB 13.8  HCT 40.8  PLT 269  MCV 88.7  MCH 30.0  MCHC 33.8  RDW 14.0  LYMPHSABS 1.1  MONOABS 0.4  EOSABS 0.2  BASOSABS 0.0    Chemistries   Recent Labs Lab 10/10/16 1622 10/12/16 0607  NA 136 139  K 3.4* 2.6*  CL 95* 99*  CO2 29 29  GLUCOSE 166* 77  BUN 12 13  CREATININE 1.19* 1.31*  CALCIUM 9.1 8.2*  MG 1.5* 2.1   ------------------------------------------------------------------------------------------------------------------ No results for input(s): CHOL, HDL, LDLCALC, TRIG, CHOLHDL, LDLDIRECT in the last 72 hours.  Lab Results  Component Value Date   HGBA1C 8.2 (H) 04/04/2013   ------------------------------------------------------------------------------------------------------------------  Recent Labs  10/10/16 2156  TSH 2.285   ------------------------------------------------------------------------------------------------------------------ No results for input(s): VITAMINB12, FOLATE, FERRITIN, TIBC, IRON, RETICCTPCT in the last 72  hours.  Coagulation profile No results for input(s): INR, PROTIME in the last 168 hours.   Recent Labs  10/10/16 1622  DDIMER 0.46    Cardiac Enzymes  Recent Labs Lab 10/10/16 1622 10/10/16 2156 10/11/16 0239  TROPONINI 0.06* 0.08* 0.09*   ------------------------------------------------------------------------------------------------------------------    Component Value Date/Time   BNP 496.0 (H) 10/10/2016 1622    Micro Results No results found for this or any previous visit (from the past 240 hour(s)).  Radiology Reports Dg Chest 2 View  Result Date: 10/10/2016 CLINICAL DATA:  Shortness of breath. EXAM: CHEST  2 VIEW COMPARISON:  Chest x-ray dated 06/12/2010 FINDINGS: The patient has small bilateral pleural effusions with bilateral interstitial pulmonary edema. Pulmonary vascularity is normal and the heart size is within normal limits. Calcification and tortuosity of the thoracic aorta. No acute bone abnormality. IMPRESSION: Findings consistent with slight interstitial edema and small bilateral pleural effusions of unknown etiology. Electronically Signed   By: Lorriane Shire M.D.   On: 10/10/2016 17:19   Ct Angio Chest Pe W/cm &/or Wo Cm  Result Date: 10/10/2016 CLINICAL DATA:  Acute onset of shortness of breath.  Initial encounter. EXAM: CT ANGIOGRAPHY CHEST WITH CONTRAST TECHNIQUE: Multidetector CT imaging of the chest was performed using the standard protocol during bolus administration of intravenous contrast. Multiplanar CT image reconstructions and MIPs were obtained to evaluate the vascular anatomy. CONTRAST:  100 mL of Isovue 370 IV contrast COMPARISON:  Chest radiograph performed earlier today at 5:04 p.m. FINDINGS: Cardiovascular:  There is no evidence of pulmonary embolus. Mild left atrial enlargement is noted. Scattered coronary artery calcification is seen. Mild calcification is noted along the aortic arch. The great vessels are grossly unremarkable in appearance.  Mediastinum/Nodes: A 1.4 cm aortopulmonary window node is seen. No additional mediastinal lymphadenopathy appreciated. No pericardial effusion is identified. The thyroid gland is grossly unremarkable. No axillary lymphadenopathy is appreciated. Lungs/Pleura: Small bilateral pleural effusions are noted. Underlying interstitial prominence is seen, with mild bibasilar opacities, likely reflecting pulmonary edema. No pneumothorax is identified. No dominant masses are seen. Upper Abdomen: The visualized portions of the liver and spleen are unremarkable. The visualized portions of the pancreas and adrenal glands are within normal limits. Musculoskeletal: No acute osseous abnormalities are identified. The visualized musculature is unremarkable in appearance. Review of the MIP images confirms the above findings. IMPRESSION: 1. No evidence of pulmonary embolus. 2. Small bilateral pleural effusions. Underlying interstitial prominence, with mild bibasilar opacities, likely reflecting pulmonary edema. 3. Mild left atrial enlargement noted. 4. Scattered coronary artery calcifications seen. 5. 1.4 cm aortopulmonary window node seen, of uncertain significance. Electronically Signed   By: Garald Balding M.D.   On: 10/10/2016 20:52    Time Spent in minutes  30   Isabellamarie Randa K M.D on 10/12/2016 at 8:25 AM  Between 7am to 7pm - Pager - [phone]  After 7pm go to [email or web address] - password [facility]  [facility] -  Office  [phone]

## 2016-10-12 NOTE — H&P (Addendum)
Admit date: 10/10/2016 Referring Physician  Dr. Candiss Norse Primary Physician  Dr. Sharilyn Sites Primary Cardiologist  None Reason for Consultation  CHF and DCM  HPI: This is a 65yo WF with a history of T2DM, HTN, depression, breast CA and hyperlipidemia who presented to [facility] with a several day history of SOB, PND, orthopnea and LE edema,  Chest xray in ER showed interstitial edema and she was admitted to the hospitalist service with CHF.  Her EKG showed lateral T wave changes and trop was mildly elevated at 0.09.  BNP was 496.  She was started on IV Lasix and 2d echo was done which showed severe LVF dysfunction with EF 25% and diffuse HK.  She is now transferred to [facility] for further evaluation and cath.  She denies any chest pain or pressure.  She has noticed some abdominal distension as well as LE edema.  Since diuresing she is feeling much better.       PMH:   Past Medical History:  Diagnosis Date  . Anxiety   . Breast cancer ([place])    right  . Depression   . Diabetes mellitus    takes only the pill  . Headache(784.0)    occiasional migraine  . Hypertension   . Irregular heart beat   . Noncompliance 01/04/2015  . Osteopenia 01/04/2015  . Skin cancer 2015   right leg     PSH:   Past Surgical History:  Procedure Laterality Date  . ABDOMINAL HYSTERECTOMY    . BREAST SURGERY     biopsy then wide excision   . CHOLECYSTECTOMY    . SENTINEL LYMPH NODE BIOPSY    . SKIN CANCER EXCISION Right 2015    Allergies:  Codeine; Penicillins; Sulfonamide derivatives; and Iodine Prior to Admit Meds:   Prescriptions Prior to Admission  Medication Sig Dispense Refill Last Dose  . albuterol (PROVENTIL HFA;VENTOLIN HFA) 108 (90 BASE) MCG/ACT inhaler Inhale 2 puffs into the lungs every 6 (six) hours as needed.   10/10/2016 at Unknown time  . Alogliptin Benzoate (NESINA) 6.25 MG TABS Take 1 tablet by mouth daily.   10/09/2016 at Unknown time  . ALPRAZolam (XANAX) 1 MG tablet Take 1 mg by mouth 2 (two)  times daily.    10/09/2016 at Unknown time  . HYDROcodone-acetaminophen (NORCO/VICODIN) 5-325 MG tablet Take 1 tablet by mouth every 6 (six) hours as needed for moderate pain or severe pain.    10/09/2016 at Unknown time  . metoprolol (LOPRESSOR) 50 MG tablet Take 1 tablet (50 mg total) by mouth 2 (two) times daily. 90 tablet 1 10/09/2016 at Unknown time  . zolpidem (AMBIEN) 10 MG tablet Take 10 mg by mouth at bedtime.   10/09/2016 at Unknown time   Fam HX:    Family History  Problem Relation Age of Onset  . Diabetes Father   . Stroke Maternal Grandmother    Social HX:    Social History   Social History  . Marital status: Widowed    Spouse name: N/A  . Number of children: N/A  . Years of education: N/A   Occupational History  . Not on file.   Social History Main Topics  . Smoking status: Former Research scientist (life sciences)  . Smokeless tobacco: Never Used  . Alcohol use No  . Drug use: No  . Sexual activity: Yes    Birth control/ protection: Surgical   Other Topics Concern  . Not on file   Social History Narrative  . No narrative  on file     ROS:  All 11 ROS were addressed and are negative except what is stated in the HPI  Physical Exam: Blood pressure 126/64, pulse 87, temperature 98.2 F (36.8 C), temperature source Oral, resp. rate 18, height 5\' 3"  (1.6 m), weight 173 lb 8 oz (78.7 kg), SpO2 96 %.    General: Well developed, well nourished, in no acute distress Head: Eyes PERRLA, No xanthomas.   Normal cephalic and atramatic  Lungs:   Clear bilaterally to auscultation and percussion. Heart:   HRRR S1 S2 Pulses are 2+ & equal.            No carotid bruit. No JVD.  No abdominal bruits. No femoral bruits. Abdomen: Bowel sounds are positive, abdomen soft and non-tender without masses or                  Hernia's noted. Msk:  Back normal, normal gait. Normal strength and tone for age. Extremities:   No clubbing, cyanosis or edema.  DP +1 Neuro: Alert and oriented X 3. Psych:  Good  affect, responds appropriately    Labs:   Lab Results  Component Value Date   WBC 8.4 10/10/2016   HGB 13.8 10/10/2016   HCT 40.8 10/10/2016   MCV 88.7 10/10/2016   PLT 269 10/10/2016    Recent Labs Lab 10/12/16 0607  NA 139  K 2.6*  CL 99*  CO2 29  BUN 13  CREATININE 1.31*  CALCIUM 8.2*  GLUCOSE 77   No results found for: PTT No results found for: INR, PROTIME Lab Results  Component Value Date   TROPONINI 0.09 ([place]) 10/11/2016    No results found for: CHOL No results found for: HDL No results found for: LDLCALC No results found for: TRIG No results found for: CHOLHDL No results found for: LDLDIRECT    Radiology:  Dg Chest 2 View  Result Date: 10/10/2016 CLINICAL DATA:  Shortness of breath. EXAM: CHEST  2 VIEW COMPARISON:  Chest x-ray dated 06/12/2010 FINDINGS: The patient has small bilateral pleural effusions with bilateral interstitial pulmonary edema. Pulmonary vascularity is normal and the heart size is within normal limits. Calcification and tortuosity of the thoracic aorta. No acute bone abnormality. IMPRESSION: Findings consistent with slight interstitial edema and small bilateral pleural effusions of unknown etiology. Electronically Signed   By: Lorriane Shire M.D.   On: 10/10/2016 17:19   Ct Angio Chest Pe W/cm &/or Wo Cm  Result Date: 10/10/2016 CLINICAL DATA:  Acute onset of shortness of breath. Initial encounter. EXAM: CT ANGIOGRAPHY CHEST WITH CONTRAST TECHNIQUE: Multidetector CT imaging of the chest was performed using the standard protocol during bolus administration of intravenous contrast. Multiplanar CT image reconstructions and MIPs were obtained to evaluate the vascular anatomy. CONTRAST:  100 mL of Isovue 370 IV contrast COMPARISON:  Chest radiograph performed earlier today at 5:04 p.m. FINDINGS: Cardiovascular:  There is no evidence of pulmonary embolus. Mild left atrial enlargement is noted. Scattered coronary artery calcification is seen. Mild  calcification is noted along the aortic arch. The great vessels are grossly unremarkable in appearance. Mediastinum/Nodes: A 1.4 cm aortopulmonary window node is seen. No additional mediastinal lymphadenopathy appreciated. No pericardial effusion is identified. The thyroid gland is grossly unremarkable. No axillary lymphadenopathy is appreciated. Lungs/Pleura: Small bilateral pleural effusions are noted. Underlying interstitial prominence is seen, with mild bibasilar opacities, likely reflecting pulmonary edema. No pneumothorax is identified. No dominant masses are seen. Upper Abdomen: The visualized portions of the  liver and spleen are unremarkable. The visualized portions of the pancreas and adrenal glands are within normal limits. Musculoskeletal: No acute osseous abnormalities are identified. The visualized musculature is unremarkable in appearance. Review of the MIP images confirms the above findings. IMPRESSION: 1. No evidence of pulmonary embolus. 2. Small bilateral pleural effusions. Underlying interstitial prominence, with mild bibasilar opacities, likely reflecting pulmonary edema. 3. Mild left atrial enlargement noted. 4. Scattered coronary artery calcifications seen. 5. 1.4 cm aortopulmonary window node seen, of uncertain significance. Electronically Signed   By: Garald Balding M.D.   On: 10/10/2016 20:52    EKG:  NSR PVCs, anterior and inferior infarcts and nonspecific IVCD with lateral T wave inversions  ASSESSMENT/PLAN:   1. Acute systolic CHF. EF 25%. She had no previous echo. Her SOB and LE edema have resolved with diuresis.  She is net neg 1L since admission and weight down 3lbs.   - Will continue Bi-dil, beta blocker, PO Lasix - fluid and salt restriction, daily weights, monitor intake and output.   - No ACE/ARB due to mild renal insufficiency  - plan right and left heart cath in am if renal function stable.  Cardiac catheterization was discussed with the patient fully. The patient  understands that risks include but are not limited to stroke (1 in 1000), death (1 in 13), kidney failure [usually temporary] (1 in 500), bleeding (1 in 200), allergic reaction [possibly serious] (1 in 200).  The patient understands and is willing to proceed.    2. Hypertension and PVCs on EKG.  - Continue beta blocker, Bidil  3. Mildly elevated troponin with flat trend (0.06/0.08/0.09).  EKG shows nonspecific IVCD with inferior and anterior Q waves concerning for multivessel CAD especially in presence of severe LV dysfunction. Chest CT showed coronary artery calcifications.  She denies any history of CP.  - continue aspirin  - stop lopressor and start coreg 6.25mg  BID - start high dose statin and check FLP in am - NPO after MN for heart cath in am  4. Mildly prolonged QTC in setting of low mag and K+. Mag has been repleted.   SSRI has held for now.  Repeat EKG - QTc still prolonged but potassium 2.6 this am. Replete potassium and then once > 4 repeat EKG.  5. Anxiety depression. Continue Xanax as needed, SSRI as in number for above.  6. GERD. On PPI.  7. Mild AKI - Hold further diuretics for now and monitor renal function.   8. Mediastinal lymphadenopathy - outpt PCP follow-up.  9. Severe hypokalemia. IV and oral replacement ordered, magnesium stable. Repeat BMET now.  10. DM type II. Due to pending left heart catheterization, possible dye exposure, mild renal insufficiency she has been changed to Lantus from Amaryl and continued on sliding scale and monitor CBGs.  HbG A1C 8.5.  Will ask diabetic coordinator to see tomorrow.      Fransico Him, MD  10/12/2016  3:36 PM

## 2016-10-12 NOTE — Progress Notes (Signed)
Report given to [facility] and nurse on 3E at [facility]. Patient transferred from unit accompanied by [facility].

## 2016-10-13 ENCOUNTER — Other Ambulatory Visit: Payer: Self-pay

## 2016-10-13 ENCOUNTER — Encounter (HOSPITAL_COMMUNITY)
Admission: EM | Disposition: A | Discharge status: Home / Self Care | Payer: Self-pay | Source: Home / Self Care | Attending: Cardiology

## 2016-10-13 DIAGNOSIS — I493 Ventricular premature depolarization: Secondary | ICD-10-CM

## 2016-10-13 DIAGNOSIS — E876 Hypokalemia: Secondary | ICD-10-CM

## 2016-10-13 DIAGNOSIS — I5023 Acute on chronic systolic (congestive) heart failure: Secondary | ICD-10-CM

## 2016-10-13 DIAGNOSIS — N179 Acute kidney failure, unspecified: Secondary | ICD-10-CM

## 2016-10-13 DIAGNOSIS — I255 Ischemic cardiomyopathy: Secondary | ICD-10-CM

## 2016-10-13 DIAGNOSIS — I2510 Atherosclerotic heart disease of native coronary artery without angina pectoris: Secondary | ICD-10-CM

## 2016-10-13 DIAGNOSIS — I5043 Acute on chronic combined systolic (congestive) and diastolic (congestive) heart failure: Secondary | ICD-10-CM

## 2016-10-13 HISTORY — PX: CARDIAC CATHETERIZATION: SHX172

## 2016-10-13 LAB — POCT I-STAT 3, VENOUS BLOOD GAS (G3P V)
ACID-BASE EXCESS: 1 mmol/L (ref 0.0–2.0)
BICARBONATE: 25.7 mmol/L (ref 20.0–28.0)
Bicarbonate: 24.9 mmol/L (ref 20.0–28.0)
O2 SAT: 69 %
O2 SAT: 97 %
PCO2 VEN: 41 mmHg — AB (ref 44.0–60.0)
PH VEN: 7.395 (ref 7.250–7.430)
PO2 VEN: 36 mmHg (ref 32.0–45.0)
TCO2: 26 mmol/L (ref 0–100)
TCO2: 27 mmol/L (ref 0–100)
pCO2, Ven: 42 mmHg — ABNORMAL LOW (ref 44.0–60.0)
pH, Ven: 7.392 (ref 7.250–7.430)
pO2, Ven: 96 mmHg — ABNORMAL HIGH (ref 32.0–45.0)

## 2016-10-13 LAB — CBC WITH DIFFERENTIAL/PLATELET
Basophils Absolute: 0 10*3/uL (ref 0.0–0.1)
Basophils Relative: 0 %
EOS PCT: 5 %
Eosinophils Absolute: 0.3 10*3/uL (ref 0.0–0.7)
HCT: 33.7 % — ABNORMAL LOW (ref 36.0–46.0)
HEMOGLOBIN: 11.1 g/dL — AB (ref 12.0–15.0)
LYMPHS ABS: 1.5 10*3/uL (ref 0.7–4.0)
LYMPHS PCT: 23 %
MCH: 29 pg (ref 26.0–34.0)
MCHC: 32.9 g/dL (ref 30.0–36.0)
MCV: 88 fL (ref 78.0–100.0)
Monocytes Absolute: 0.5 10*3/uL (ref 0.1–1.0)
Monocytes Relative: 7 %
NEUTROS PCT: 65 %
Neutro Abs: 4.5 10*3/uL (ref 1.7–7.7)
PLATELETS: 250 10*3/uL (ref 150–400)
RBC: 3.83 MIL/uL — AB (ref 3.87–5.11)
RDW: 14.4 % (ref 11.5–15.5)
WBC: 6.8 10*3/uL (ref 4.0–10.5)

## 2016-10-13 LAB — BASIC METABOLIC PANEL
ANION GAP: 9 (ref 5–15)
BUN: 13 mg/dL (ref 6–20)
CALCIUM: 8 mg/dL — AB (ref 8.9–10.3)
CHLORIDE: 102 mmol/L (ref 101–111)
CO2: 27 mmol/L (ref 22–32)
CREATININE: 1.47 mg/dL — AB (ref 0.44–1.00)
GFR calc non Af Amer: 36 mL/min — ABNORMAL LOW (ref >60)
GFR, EST AFRICAN AMERICAN: 42 mL/min — AB (ref >60)
Glucose, Bld: 99 mg/dL (ref 65–99)
Potassium: 3 mmol/L — ABNORMAL LOW (ref 3.5–5.1)
SODIUM: 138 mmol/L (ref 135–145)

## 2016-10-13 LAB — GLUCOSE, CAPILLARY
GLUCOSE-CAPILLARY: 106 mg/dL — AB (ref 65–99)
GLUCOSE-CAPILLARY: 113 mg/dL — AB (ref 65–99)
GLUCOSE-CAPILLARY: 134 mg/dL — AB (ref 65–99)
GLUCOSE-CAPILLARY: 295 mg/dL — AB (ref 65–99)
Glucose-Capillary: 168 mg/dL — ABNORMAL HIGH (ref 65–99)

## 2016-10-13 LAB — PROTIME-INR
INR: 1.02
Prothrombin Time: 13.4 seconds (ref 11.4–15.2)

## 2016-10-13 LAB — URINE CULTURE

## 2016-10-13 SURGERY — LEFT HEART CATH AND CORONARY ANGIOGRAPHY
Anesthesia: LOCAL

## 2016-10-13 MED ORDER — POTASSIUM CHLORIDE CRYS ER 20 MEQ PO TBCR
20.0000 meq | EXTENDED_RELEASE_TABLET | Freq: Once | ORAL | Status: AC
  Administered 2016-10-13: 20 meq via ORAL
  Filled 2016-10-13: qty 1

## 2016-10-13 MED ORDER — POTASSIUM CHLORIDE 2 MEQ/ML IV SOLN
30.0000 meq | Freq: Once | INTRAVENOUS | Status: AC
  Administered 2016-10-13: 30 meq via INTRAVENOUS
  Filled 2016-10-13: qty 15

## 2016-10-13 MED ORDER — SODIUM CHLORIDE 0.9% FLUSH
3.0000 mL | Freq: Two times a day (BID) | INTRAVENOUS | Status: DC
  Administered 2016-10-14 – 2016-10-15 (×4): 3 mL via INTRAVENOUS

## 2016-10-13 MED ORDER — METHYLPREDNISOLONE SODIUM SUCC 125 MG IJ SOLR
125.0000 mg | Freq: Once | INTRAMUSCULAR | Status: AC
  Administered 2016-10-13: 125 mg via INTRAVENOUS
  Filled 2016-10-13: qty 2

## 2016-10-13 MED ORDER — FENTANYL CITRATE (PF) 100 MCG/2ML IJ SOLN
INTRAMUSCULAR | Status: DC | PRN
  Administered 2016-10-13: 25 ug via INTRAVENOUS

## 2016-10-13 MED ORDER — HEPARIN (PORCINE) IN NACL 100-0.45 UNIT/ML-% IJ SOLN
1050.0000 [IU]/h | INTRAMUSCULAR | Status: DC
  Administered 2016-10-14: 900 [IU]/h via INTRAVENOUS
  Filled 2016-10-13: qty 250

## 2016-10-13 MED ORDER — LIDOCAINE HCL (PF) 1 % IJ SOLN
INTRAMUSCULAR | Status: DC | PRN
  Administered 2016-10-13: 15 mL

## 2016-10-13 MED ORDER — CLOPIDOGREL BISULFATE 300 MG PO TABS
600.0000 mg | ORAL_TABLET | Freq: Once | ORAL | Status: AC
  Administered 2016-10-13: 600 mg via ORAL
  Filled 2016-10-13: qty 2

## 2016-10-13 MED ORDER — METOPROLOL TARTRATE 5 MG/5ML IV SOLN
INTRAVENOUS | Status: AC
  Filled 2016-10-13: qty 5

## 2016-10-13 MED ORDER — DIPHENHYDRAMINE HCL 50 MG/ML IJ SOLN
25.0000 mg | Freq: Once | INTRAMUSCULAR | Status: AC
  Administered 2016-10-13: 25 mg via INTRAVENOUS
  Filled 2016-10-13: qty 1

## 2016-10-13 MED ORDER — MIDAZOLAM HCL 2 MG/2ML IJ SOLN
INTRAMUSCULAR | Status: DC | PRN
  Administered 2016-10-13: 2 mg via INTRAVENOUS

## 2016-10-13 MED ORDER — HEPARIN (PORCINE) IN NACL 2-0.9 UNIT/ML-% IJ SOLN
INTRAMUSCULAR | Status: AC
  Filled 2016-10-13: qty 1000

## 2016-10-13 MED ORDER — CLOPIDOGREL BISULFATE 75 MG PO TABS
75.0000 mg | ORAL_TABLET | Freq: Every day | ORAL | Status: DC
  Administered 2016-10-14 – 2016-10-16 (×3): 75 mg via ORAL
  Filled 2016-10-13 (×4): qty 1

## 2016-10-13 MED ORDER — MIDAZOLAM HCL 2 MG/2ML IJ SOLN
INTRAMUSCULAR | Status: AC
  Filled 2016-10-13: qty 2

## 2016-10-13 MED ORDER — FENTANYL CITRATE (PF) 100 MCG/2ML IJ SOLN
INTRAMUSCULAR | Status: AC
  Filled 2016-10-13: qty 2

## 2016-10-13 MED ORDER — METOPROLOL TARTRATE 5 MG/5ML IV SOLN
INTRAVENOUS | Status: DC | PRN
  Administered 2016-10-13: 5 mg via INTRAVENOUS

## 2016-10-13 MED ORDER — IOPAMIDOL (ISOVUE-370) INJECTION 76%
INTRAVENOUS | Status: DC | PRN
  Administered 2016-10-13: 70 mL via INTRA_ARTERIAL

## 2016-10-13 MED ORDER — FAMOTIDINE IN NACL 20-0.9 MG/50ML-% IV SOLN
20.0000 mg | Freq: Once | INTRAVENOUS | Status: DC
Start: 2016-10-13 — End: 2016-10-16
  Filled 2016-10-13: qty 50

## 2016-10-13 MED ORDER — METOPROLOL TARTRATE 5 MG/5ML IV SOLN
5.0000 mg | Freq: Once | INTRAVENOUS | Status: AC
  Administered 2016-10-13: 5 mg via INTRAVENOUS

## 2016-10-13 MED ORDER — LIDOCAINE HCL (PF) 1 % IJ SOLN
INTRAMUSCULAR | Status: AC
  Filled 2016-10-13: qty 30

## 2016-10-13 MED ORDER — SODIUM CHLORIDE 0.9 % IV SOLN: 250.0000 mL | INTRAVENOUS | Status: DC | PRN

## 2016-10-13 MED ORDER — IOPAMIDOL (ISOVUE-370) INJECTION 76%
INTRAVENOUS | Status: AC
  Filled 2016-10-13: qty 100

## 2016-10-13 MED ORDER — HEPARIN (PORCINE) IN NACL 2-0.9 UNIT/ML-% IJ SOLN
INTRAMUSCULAR | Status: DC | PRN
  Administered 2016-10-13: 1000 mL

## 2016-10-13 MED ORDER — SODIUM CHLORIDE 0.9 % IV SOLN: INTRAVENOUS | Status: DC

## 2016-10-13 MED ORDER — SODIUM CHLORIDE 0.9% FLUSH: 3.0000 mL | INTRAVENOUS | Status: DC | PRN

## 2016-10-13 MED ORDER — METHYLPREDNISOLONE SODIUM SUCC 40 MG IJ SOLR: 40.0000 mg | Freq: Once | INTRAMUSCULAR | Status: DC

## 2016-10-13 SURGICAL SUPPLY — 11 items
CATH INFINITI MULTIPACK ST 5F (CATHETERS) ×2 IMPLANT
CATH SWAN GANZ 7F STRAIGHT (CATHETERS) ×1 IMPLANT
HOVERMATT SINGLE USE (MISCELLANEOUS) ×2 IMPLANT
KIT HEART LEFT (KITS) ×2 IMPLANT
PACK CARDIAC CATHETERIZATION (CUSTOM PROCEDURE TRAY) ×2 IMPLANT
SHEATH PINNACLE 5F 10CM (SHEATH) ×1 IMPLANT
SHEATH PINNACLE 7F 10CM (SHEATH) ×1 IMPLANT
TRANSDUCER W/STOPCOCK (MISCELLANEOUS) ×2 IMPLANT
TUBING CIL FLEX 10 FLL-RA (TUBING) ×2 IMPLANT
WIRE EMERALD 3MM-J .025X260CM (WIRE) ×2 IMPLANT
WIRE EMERALD 3MM-J .035X150CM (WIRE) ×2 IMPLANT

## 2016-10-13 NOTE — Progress Notes (Signed)
Pt. encouraged to watch CCTV video night before procedure. Patient refused stating that she is informed enough.  [place], RN

## 2016-10-13 NOTE — Progress Notes (Signed)
Pt refused bed alarm on. Will continue hourly rounding. 

## 2016-10-13 NOTE — H&P (View-Only) (Signed)
Patient Name: MAKYAH LAVIGNE Date of Encounter: 10/13/2016  Primary Cardiologist: New, Dr. Sundra Aland Problem List     Principal Problem:   SOB (shortness of breath) Active Problems:   ANXIETY DEPRESSION   HTN (hypertension)   Unifocal PVCs   GERD (gastroesophageal reflux disease)   Troponin level elevated   Elevated troponin   Acute systolic CHF (congestive heart failure) (HCC)     Subjective   Feels well, denies chest pain and SOB.  Very anxious for upcoming procedure. Did not sleep well because of anxiety.  Inpatient Medications    Scheduled Meds: . albuterol  2.5 mg Nebulization Q6H  . anastrozole  1 mg Oral Daily  . [START ON 10/14/2016] aspirin EC  81 mg Oral Daily  . atorvastatin  80 mg Oral q1800  . carvedilol  6.25 mg Oral BID WC  . diphenhydrAMINE  25 mg Intravenous Once  . heparin  5,000 Units Subcutaneous Q8H  . insulin aspart  0-15 Units Subcutaneous TID WC  . insulin aspart  0-5 Units Subcutaneous QHS  . insulin glargine  15 Units Subcutaneous Daily  . isosorbide-hydrALAZINE  1 tablet Oral TID  . pantoprazole  40 mg Oral BID AC  . potassium chloride  20 mEq Oral Once  . sodium chloride flush  3 mL Intravenous Q12H  . zolpidem  5 mg Oral QHS   Continuous Infusions: . sodium chloride 10 mL/hr at 10/13/16 0548   PRN Meds: sodium chloride, albuterol, LORazepam, [DISCONTINUED] ondansetron **OR** ondansetron (ZOFRAN) IV, sodium chloride flush   Vital Signs    Vitals:   10/12/16 2221 10/13/16 0140 10/13/16 0526 10/13/16 1006  BP: 137/65 123/64 131/65 137/76  Pulse: 91 89 95 83  Resp:  16 18   Temp:   98.9 F (37.2 C)   TempSrc:   Oral   SpO2:  94% 93%   Weight:   174 lb (78.9 kg)   Height:        Intake/Output Summary (Last 24 hours) at 10/13/16 1145 Last data filed at 10/13/16 0602  Gross per 24 hour  Intake           242.33 ml  Output              800 ml  Net          -557.67 ml   Filed Weights   10/12/16 0500 10/12/16 1516  10/13/16 0526  Weight: 172 lb 12.8 oz (78.4 kg) 173 lb 8 oz (78.7 kg) 174 lb (78.9 kg)    Physical Exam   GEN: Well nourished, well developed, in no acute distress.  HEENT: Grossly normal.  Neck: Supple, mild JVD, carotid bruits, or masses. Cardiac: RRR with frequent ectopy, no murmurs, rubs, or gallops. No clubbing, cyanosis, edema.  Radials/DP/PT 2+ and equal bilaterally.  Respiratory:  Respirations regular and unlabored, clear to auscultation bilaterally with faint bibasilar rales. GI: Soft, nontender, nondistended, BS + x 4. MS: no deformity or atrophy. Skin: warm and dry, no rash. Neuro:  Strength and sensation are intact. Psych: AAOx3.  Normal affect.  Labs    CBC  Recent Labs  10/10/16 1622 10/13/16 0331  WBC 8.4 6.8  NEUTROABS 6.7 4.5  HGB 13.8 11.1*  HCT 40.8 33.7*  MCV 88.7 88.0  PLT 269 812   Basic Metabolic Panel  Recent Labs  10/10/16 1622 10/12/16 0607 10/12/16 1629 10/13/16 0331  NA 136 139 136 138  K 3.4* 2.6* 3.5 3.0*  CL 95*  99* 100* 102  CO2 29 29 25 27   GLUCOSE 166* 77 75 99  BUN 12 13 13 13   CREATININE 1.19* 1.31* 1.61* 1.47*  CALCIUM 9.1 8.2* 8.1* 8.0*  MG 1.5* 2.1  --   --    Cardiac Enzymes  Recent Labs  10/10/16 1622 10/10/16 2156 10/11/16 0239  TROPONINI 0.06* 0.08* 0.09*   D-Dimer  Recent Labs  10/10/16 1622  DDIMER 0.46   Hemoglobin A1C  Recent Labs  10/10/16 2156  HGBA1C 8.5*    Thyroid Function Tests  Recent Labs  10/10/16 2156  TSH 2.285    Telemetry     NSR- Personally Reviewed - frequent PVCs noted.  ECG    NSR with nonspecific ST changes in anteroseptal leads.  - Personally Reviewed  Radiology    No results found.  Cardiac Studies   Transthoracic Echocardiography Study Conclusions  - Left ventricle: The cavity size was mildly dilated. Wall   thickness was increased in a pattern of mild LVH. Systolic   function was severely reduced. The estimated ejection fraction   was in the range  of 20% to 25%. Diffuse hypokinesis. There is   akinesis of the inferolateral myocardium. Doppler parameters are   consistent with abnormal left ventricular relaxation (grade 1   diastolic dysfunction). - Mitral valve: There was mild regurgitation. - Left atrium: The atrium was moderately dilated.  Impressions:  - Severe global reduction in LV systolic function; mild LVH and   LVE; grade 1 diastolic dysfunction; moderate LAE; mild MR.   Patient Profile     This is a 65yo WF with a history of T2DM, HTN, depression, breast CA and hyperlipidemia who presented to [facility] with a several day history of SOB, PND, orthopnea and LE edema,  Chest xray in ER showed interstitial edema and she was admitted to the hospitalist service with CHF.  Her EKG showed lateral T wave changes and trop was mildly elevated at 0.09.  BNP was 496.  She was started on IV Lasix and 2d echo was done which showed severe LVF dysfunction with EF 25% and diffuse HK.  She was transferred to [facility] for further evaluation and cath.  She denies any chest pain or pressure.  She has noticed some abdominal distension as well as LE edema.  Since diuresing she is feeling much better.    Assessment & Plan    1. Acute Combined systolic and diastolic CHF.EF 25%. She had noprevious echo. Her SOB and LE edema have resolved with diuresis. Neg 1.5 L, weight up one pound from yesterday.   - Will continue Bi-dil, beta blocker, PO Lasix - fluid and salt restriction, daily weights, monitor intake and output.   - No ACE/ARB due to mild renal insufficiency  - plan right and left heart cath in am. Cardiac catheterization was discussed with the patient fully. The patient understands that risks include but are not limited to stroke (1 in 1000), death (1 in 12), kidney failure [usually temporary] (1 in 500), bleeding (1 in 200), allergic reaction [possibly serious] (1 in 200).  The patient understands and is willing to proceed.    2. Hypertension and  PVCs on EKG.  - Continue beta blocker, Bidil  3. Mildly elevated troponin with flat trend (0.06/0.08/0.09).  EKG shows nonspecific IVCD with inferior and anterior Q waves concerning for multivessel CAD especially in presence of severe LV dysfunction. Chest CT showed coronary artery calcifications.  She denies any history of CP.  - continue aspirin  -  stop lopressor and start coreg 6.25mg  BID - start high dose statin and check FLP in am - NPO after MN for heart cath in am  4. Mildly prolonged QTC: in setting of low mag and K+. Mag has been repleted.   SSRI has held for now.  Repeat EKG - QTc still prolonged but potassium 2.6 this am. Replete potassium and then once > 4 repeat EKG.  5. Anxiety depression. Continue Xanax as needed, SSRI as in number for above.  6. GERD. On PPI.  7. Mild AKI - Hold further diuretics for now and monitor renal function.   8. Mediastinal lymphadenopathy - outpt PCP follow-up.  9. Severe hypokalemia. IV and oral replacement ordered, magnesium stable.   10. DM type II. Due to pending left heart catheterization, possible dye exposure, mild renal insufficiency she has been changed to Lantus from Amaryl and continued on sliding scale and monitor CBGs. HbG A1C 8.5.  Will ask diabetic coordinator to see tomorrow.       Signed, Arbutus Leas, NP  10/13/2016, 11:45 AM   I have seen, examined and evaluated the patient this AM along with Jettie Booze, NP.  After reviewing all the available data and chart, we discussed the patients laboratory, study & physical findings as well as symptoms in detail. I agree with her findings, examination as well as impression recommendations as per our discussion.    Very pleasant woman with significant cardiac risk factors of diabetes mellitus2, hypertension and hyperlipidemia who presents now with appears to be acute combined systolic and diastolic heart failure with mild troponin elevation. Her initial presentation was  secondary to profound dyspnea. She seems to be doing much better after initial diuresis. Plan for today is right left heart catheterization. She will need potassium supplementation. With mild bump in her creatinine yesterday, IV diuretics have been held and her renal function seems to be showing a trend of the right direction. Further diuresis pending results of right and left heart catheterization -- would not check LV Gram  Potential etiology for her heart failure includes macrovascular ischemia, microvascular ischemia in setting of diabetes, idiopathic versus in the setting of some type of viral prodrome. She did not seem to have any lead up symptoms to suggest recent illness. He does however note that the dyspnea has been going on for a little while longer than just a couple days. He has not however noted any chest tightness or pressure.  She was converted from Lopressor to carvedilol yesterday - hopefully with increased dosing, her PVCs will diminish.. We can further titrate based on blood pressure and heart rate, but for now relatively stable. With concern for recent increasing creatinine, she is on BiDil and not ARB/ACE inhibitor. Would consider switching to ARB either predischarge or following discharge in the outpatient setting. With a significantly reduced EF she would potentially be a candidate for converting from ARB to [facility].  She is on mealtime coverage insulin along with sliding scale.  She has when necessary Ativan for anxiety - would probably be beneficial to give this to her prior to her catheterization as she is extremely antsy.   Performing MD: Either Dr. Angelena Form or Dr. Saunders Revel  Procedure:  Right and Left Heart Catheterization with Coronary Angiography and Possible Wernicke's Coronary Intervention  The procedure with Risks/Benefits/Alternatives and Indications was reviewed with the patient and sister.  All questions were answered.    Risks / Complications include, but not  limited to: Death, MI, CVA/TIA, VF/VT (with defibrillation),  Bradycardia (need for temporary pacer placement), contrast induced nephropathy, bleeding / bruising / hematoma / pseudoaneurysm, vascular or coronary injury (with possible emergent CT or Vascular Surgery), adverse medication reactions, infection.  Additional risks involving the use of radiation with the possibility of radiation burns and cancer were explained in detail.  The patient and family voice understanding and agree to proceed.      Glenetta Hew, M.D., M.S. Interventional Cardiologist   Pager # [phone] Phone # [phone] [place], [place] [address]

## 2016-10-13 NOTE — Interval H&P Note (Signed)
History and Physical Interval Note:  10/13/2016 1:54 PM  Kristen Garner  has presented today for cardiac cath with the diagnosis of cardiomyopathy/elevated troponin. The various methods of treatment have been discussed with the patient and family. After consideration of risks, benefits and other options for treatment, the patient has consented to  Procedure(s): Left Heart Cath and Coronary Angiography (N/A) as a surgical intervention .  The patient's history has been reviewed, patient examined, no change in status, stable for surgery.  I have reviewed the patient's chart and labs.  Questions were answered to the patient's satisfaction.    Cath Lab Visit (complete for each Cath Lab visit)  Clinical Evaluation Leading to the Procedure:   ACS: Yes.    Non-ACS:    Anginal Classification: CCS II  Anti-ischemic medical therapy: Minimal Therapy (1 class of medications)  Non-Invasive Test Results: No non-invasive testing performed  Prior CABG: No previous CABG        Lauree Chandler

## 2016-10-13 NOTE — Progress Notes (Signed)
Patient educated about safety and importance of bed alarm. Patient refused bed alarm.. Will continue to monitor.  Susumu Hackler, RN

## 2016-10-13 NOTE — Progress Notes (Signed)
Inpatient Diabetes Program Recommendations  AACE/ADA: New Consensus Statement on Inpatient Glycemic Control (2015)  Target Ranges:  Prepandial:   less than 140 mg/dL      Peak postprandial:   less than 180 mg/dL (1-2 hours)      Critically ill patients:  140 - 180 mg/dL   Results for Kristen Garner, Kristen Garner (MRN [id number]) as of 10/13/2016 10:14  Ref. Range 10/12/2016 07:42 10/12/2016 11:46 10/12/2016 16:03 10/12/2016 20:59 10/13/2016 06:00  Glucose-Capillary Latest Ref Range: 65 - 99 mg/dL 85 93 75 135 (H) 106 (H)   Results for Kristen Garner (MRN [id number]) as of 10/13/2016 10:14  Ref. Range 10/10/2016 21:56  Hemoglobin A1C Latest Ref Range: 4.8 - 5.6 % 8.5 (H)    Admit with: SOB  History: DM2  Home DM Meds: Alogliptin (Nesina) 6.25 mg daily  Current Insulin Orders: Lantus 15 units daily      Novolog Moderate Correction Scale/ SSI (0-15 units) TID AC + HS      -Current A1c of 8.5% shows pt's CBGs not well controlled at home.  Needs close follow-up with [name] [facility] in [place]) after d/c.  May need additional medication or better lifestyle changes at home to further reduce A1c to goal of 7% or less per ADA Standards.  -Note Lantus ordered yesterday, however, patient refused Lantus yesterday AM (11/19) and RN held Lantus this AM.    -CBGs well controlled on Novolog Moderate Correction Scale/ SSI (0-15 units) TID AC + HS at present.     MD- Not sure why Amaryl 4 mg daily was ordered for this patient?  Patient does not take Amaryl at home.  Note Amaryl discontinued 11/19 in the AM.  Patient refused Lantus yesterday and dose was held this AM by RN.  MD- Please discontinue Lantus 15 units daily.  Spoke with patient by phone today and she relayed to me that she used to take Insulin (could not remember the name) but that she stopped this insulin over 6 months ago due to cost.  Was started on Alogliptin (Nesina) about 2 months ago by her PCP (was given sample  packs).  Has never taken Amaryl (glimepiride) at home.  Patient told me she has made some major nutritional changes at home in the last 2 months.  Switched to diet sodas from regular sodas and has also tried to cut down on her "sugars".  Has been drinking a lot of gatorade at home.  Encouraged pt to switch to the lower calorie G2 option over gatorade as gatorade still has a fair amount of carbohydrate per serving.  Patient agreeable to this plan.  Spoke with patient about her current A1c of 8.5%.  Explained what an A1c is and what it measures.  Reminded patient that her goal A1c is 7% or less per ADA standards to prevent both acute and long-term complications.  Explained to patient the extreme importance of good glucose control at home.  Encouraged patient to check her CBGs at least bid at home (fasting and another check within the day) and to record all CBGs in a logbook for her PCP or Endocrinologist to review.  Patient stated she often checks her CBGs 2-3 times per day and stated to me that her PCP was proud of her for getting her A1c down to the 8% range as it was 10% a coupe of months back per patient.  Congratulated pt on her accomplishment and encouraged her to continue to work to get her A1c to  7% or less.    --Will follow patient during hospitalization--  Wyn Quaker RN, MSN, CDE Diabetes Coordinator Inpatient Glycemic Control Team Team Pager: [phone] (8a-5p)

## 2016-10-13 NOTE — Progress Notes (Addendum)
Patient Name: Kristen Garner Date of Encounter: 10/13/2016  Primary Cardiologist: New, Dr. Sundra Aland Problem List     Principal Problem:   SOB (shortness of breath) Active Problems:   ANXIETY DEPRESSION   HTN (hypertension)   Unifocal PVCs   GERD (gastroesophageal reflux disease)   Troponin level elevated   Elevated troponin   Acute systolic CHF (congestive heart failure) (HCC)     Subjective   Feels well, denies chest pain and SOB.  Very anxious for upcoming procedure. Did not sleep well because of anxiety.  Inpatient Medications    Scheduled Meds: . albuterol  2.5 mg Nebulization Q6H  . anastrozole  1 mg Oral Daily  . [START ON 10/14/2016] aspirin EC  81 mg Oral Daily  . atorvastatin  80 mg Oral q1800  . carvedilol  6.25 mg Oral BID WC  . diphenhydrAMINE  25 mg Intravenous Once  . heparin  5,000 Units Subcutaneous Q8H  . insulin aspart  0-15 Units Subcutaneous TID WC  . insulin aspart  0-5 Units Subcutaneous QHS  . insulin glargine  15 Units Subcutaneous Daily  . isosorbide-hydrALAZINE  1 tablet Oral TID  . pantoprazole  40 mg Oral BID AC  . potassium chloride  20 mEq Oral Once  . sodium chloride flush  3 mL Intravenous Q12H  . zolpidem  5 mg Oral QHS   Continuous Infusions: . sodium chloride 10 mL/hr at 10/13/16 0548   PRN Meds: sodium chloride, albuterol, LORazepam, [DISCONTINUED] ondansetron **OR** ondansetron (ZOFRAN) IV, sodium chloride flush   Vital Signs    Vitals:   10/12/16 2221 10/13/16 0140 10/13/16 0526 10/13/16 1006  BP: 137/65 123/64 131/65 137/76  Pulse: 91 89 95 83  Resp:  16 18   Temp:   98.9 F (37.2 C)   TempSrc:   Oral   SpO2:  94% 93%   Weight:   174 lb (78.9 kg)   Height:        Intake/Output Summary (Last 24 hours) at 10/13/16 1145 Last data filed at 10/13/16 0602  Gross per 24 hour  Intake           242.33 ml  Output              800 ml  Net          -557.67 ml   Filed Weights   10/12/16 0500 10/12/16 1516  10/13/16 0526  Weight: 172 lb 12.8 oz (78.4 kg) 173 lb 8 oz (78.7 kg) 174 lb (78.9 kg)    Physical Exam   GEN: Well nourished, well developed, in no acute distress.  HEENT: Grossly normal.  Neck: Supple, mild JVD, carotid bruits, or masses. Cardiac: RRR with frequent ectopy, no murmurs, rubs, or gallops. No clubbing, cyanosis, edema.  Radials/DP/PT 2+ and equal bilaterally.  Respiratory:  Respirations regular and unlabored, clear to auscultation bilaterally with faint bibasilar rales. GI: Soft, nontender, nondistended, BS + x 4. MS: no deformity or atrophy. Skin: warm and dry, no rash. Neuro:  Strength and sensation are intact. Psych: AAOx3.  Normal affect.  Labs    CBC  Recent Labs  10/10/16 1622 10/13/16 0331  WBC 8.4 6.8  NEUTROABS 6.7 4.5  HGB 13.8 11.1*  HCT 40.8 33.7*  MCV 88.7 88.0  PLT 269 681   Basic Metabolic Panel  Recent Labs  10/10/16 1622 10/12/16 0607 10/12/16 1629 10/13/16 0331  NA 136 139 136 138  K 3.4* 2.6* 3.5 3.0*  CL 95*  99* 100* 102  CO2 29 29 25 27   GLUCOSE 166* 77 75 99  BUN 12 13 13 13   CREATININE 1.19* 1.31* 1.61* 1.47*  CALCIUM 9.1 8.2* 8.1* 8.0*  MG 1.5* 2.1  --   --    Cardiac Enzymes  Recent Labs  10/10/16 1622 10/10/16 2156 10/11/16 0239  TROPONINI 0.06* 0.08* 0.09*   D-Dimer  Recent Labs  10/10/16 1622  DDIMER 0.46   Hemoglobin A1C  Recent Labs  10/10/16 2156  HGBA1C 8.5*    Thyroid Function Tests  Recent Labs  10/10/16 2156  TSH 2.285    Telemetry     NSR- Personally Reviewed - frequent PVCs noted.  ECG    NSR with nonspecific ST changes in anteroseptal leads.  - Personally Reviewed  Radiology    No results found.  Cardiac Studies   Transthoracic Echocardiography Study Conclusions  - Left ventricle: The cavity size was mildly dilated. Wall   thickness was increased in a pattern of mild LVH. Systolic   function was severely reduced. The estimated ejection fraction   was in the range  of 20% to 25%. Diffuse hypokinesis. There is   akinesis of the inferolateral myocardium. Doppler parameters are   consistent with abnormal left ventricular relaxation (grade 1   diastolic dysfunction). - Mitral valve: There was mild regurgitation. - Left atrium: The atrium was moderately dilated.  Impressions:  - Severe global reduction in LV systolic function; mild LVH and   LVE; grade 1 diastolic dysfunction; moderate LAE; mild MR.   Patient Profile     This is a 65yo WF with a history of T2DM, HTN, depression, breast CA and hyperlipidemia who presented to [facility] with a several day history of SOB, PND, orthopnea and LE edema,  Chest xray in ER showed interstitial edema and she was admitted to the hospitalist service with CHF.  Her EKG showed lateral T wave changes and trop was mildly elevated at 0.09.  BNP was 496.  She was started on IV Lasix and 2d echo was done which showed severe LVF dysfunction with EF 25% and diffuse HK.  She was transferred to [facility] for further evaluation and cath.  She denies any chest pain or pressure.  She has noticed some abdominal distension as well as LE edema.  Since diuresing she is feeling much better.    Assessment & Plan    1. Acute Combined systolic and diastolic CHF.EF 25%. She had noprevious echo. Her SOB and LE edema have resolved with diuresis. Neg 1.5 L, weight up one pound from yesterday.   - Will continue Bi-dil, beta blocker, PO Lasix - fluid and salt restriction, daily weights, monitor intake and output.   - No ACE/ARB due to mild renal insufficiency  - plan right and left heart cath in am. Cardiac catheterization was discussed with the patient fully. The patient understands that risks include but are not limited to stroke (1 in 1000), death (1 in 45), kidney failure [usually temporary] (1 in 500), bleeding (1 in 200), allergic reaction [possibly serious] (1 in 200).  The patient understands and is willing to proceed.    2. Hypertension and  PVCs on EKG.  - Continue beta blocker, Bidil  3. Mildly elevated troponin with flat trend (0.06/0.08/0.09).  EKG shows nonspecific IVCD with inferior and anterior Q waves concerning for multivessel CAD especially in presence of severe LV dysfunction. Chest CT showed coronary artery calcifications.  She denies any history of CP.  - continue aspirin  -  stop lopressor and start coreg 6.25mg  BID - start high dose statin and check FLP in am - NPO after MN for heart cath in am  4. Mildly prolonged QTC: in setting of low mag and K+. Mag has been repleted.   SSRI has held for now.  Repeat EKG - QTc still prolonged but potassium 2.6 this am. Replete potassium and then once > 4 repeat EKG.  5. Anxiety depression. Continue Xanax as needed, SSRI as in number for above.  6. GERD. On PPI.  7. Mild AKI - Hold further diuretics for now and monitor renal function.   8. Mediastinal lymphadenopathy - outpt PCP follow-up.  9. Severe hypokalemia. IV and oral replacement ordered, magnesium stable.   10. DM type II. Due to pending left heart catheterization, possible dye exposure, mild renal insufficiency she has been changed to Lantus from Amaryl and continued on sliding scale and monitor CBGs. HbG A1C 8.5.  Will ask diabetic coordinator to see tomorrow.       Signed, Arbutus Leas, NP  10/13/2016, 11:45 AM   I have seen, examined and evaluated the patient this AM along with Jettie Booze, NP.  After reviewing all the available data and chart, we discussed the patients laboratory, study & physical findings as well as symptoms in detail. I agree with her findings, examination as well as impression recommendations as per our discussion.    Very pleasant woman with significant cardiac risk factors of diabetes mellitus2, hypertension and hyperlipidemia who presents now with appears to be acute combined systolic and diastolic heart failure with mild troponin elevation. Her initial presentation was  secondary to profound dyspnea. She seems to be doing much better after initial diuresis. Plan for today is right left heart catheterization. She will need potassium supplementation. With mild bump in her creatinine yesterday, IV diuretics have been held and her renal function seems to be showing a trend of the right direction. Further diuresis pending results of right and left heart catheterization -- would not check LV Gram  Potential etiology for her heart failure includes macrovascular ischemia, microvascular ischemia in setting of diabetes, idiopathic versus in the setting of some type of viral prodrome. She did not seem to have any lead up symptoms to suggest recent illness. He does however note that the dyspnea has been going on for a little while longer than just a couple days. He has not however noted any chest tightness or pressure.  She was converted from Lopressor to carvedilol yesterday - hopefully with increased dosing, her PVCs will diminish.. We can further titrate based on blood pressure and heart rate, but for now relatively stable. With concern for recent increasing creatinine, she is on BiDil and not ARB/ACE inhibitor. Would consider switching to ARB either predischarge or following discharge in the outpatient setting. With a significantly reduced EF she would potentially be a candidate for converting from ARB to [facility].  She is on mealtime coverage insulin along with sliding scale.  She has when necessary Ativan for anxiety - would probably be beneficial to give this to her prior to her catheterization as she is extremely antsy.   Performing MD: Either Dr. Angelena Form or Dr. Saunders Revel  Procedure:  Right and Left Heart Catheterization with Coronary Angiography and Possible Wernicke's Coronary Intervention  The procedure with Risks/Benefits/Alternatives and Indications was reviewed with the patient and sister.  All questions were answered.    Risks / Complications include, but not  limited to: Death, MI, CVA/TIA, VF/VT (with defibrillation),  Bradycardia (need for temporary pacer placement), contrast induced nephropathy, bleeding / bruising / hematoma / pseudoaneurysm, vascular or coronary injury (with possible emergent CT or Vascular Surgery), adverse medication reactions, infection.  Additional risks involving the use of radiation with the possibility of radiation burns and cancer were explained in detail.  The patient and family voice understanding and agree to proceed.      Glenetta Hew, M.D., M.S. Interventional Cardiologist   Pager # [phone] Phone # [phone] [address]. [place], [place] [address]

## 2016-10-13 NOTE — Progress Notes (Signed)
ANTICOAGULATION CONSULT NOTE - Initial Consult  Pharmacy Consult for Heparin Indication: CAD  Allergies  Allergen Reactions  . Codeine Itching  . Penicillins Other (See Comments)    Child hood allergy  . Sulfonamide Derivatives Itching  . Iodine     Patient states that she got a cat scratch and her mom put iodine on it and then got catch scratch fever    Patient Measurements: Height: 5\' 3"  (160 cm) Weight: 174 lb (78.9 kg) (c scale) IBW/kg (Calculated) : 52.4 Heparin Dosing Weight: 70 kg  Vital Signs: Temp: 98.2 F (36.8 C) (11/20 1633) Temp Source: Oral (11/20 1633) BP: 123/74 (11/20 1633) Pulse Rate: 118 (11/20 1633)  Labs:  Recent Labs  10/10/16 2156 10/11/16 0239 10/12/16 0607 10/12/16 1629 10/13/16 0331  HGB  --   --   --   --  11.1*  HCT  --   --   --   --  33.7*  PLT  --   --   --   --  250  LABPROT  --   --   --   --  13.4  INR  --   --   --   --  1.02  CREATININE  --   --  1.31* 1.61* 1.47*  TROPONINI 0.08* 0.09*  --   --   --     Estimated Creatinine Clearance: 37.9 mL/min (by C-G formula based on SCr of 1.47 mg/dL (H)).   Medical History: Past Medical History:  Diagnosis Date  . Anxiety   . Breast cancer ([place])    right  . Depression   . Diabetes mellitus    takes only the pill  . Headache(784.0)    occiasional migraine  . Hypertension   . Irregular heart beat   . Noncompliance 01/04/2015  . Osteopenia 01/04/2015  . Skin cancer 2015   right leg    Medications:  Prescriptions Prior to Admission  Medication Sig Dispense Refill Last Dose  . albuterol (PROVENTIL HFA;VENTOLIN HFA) 108 (90 BASE) MCG/ACT inhaler Inhale 2 puffs into the lungs every 6 (six) hours as needed.   10/10/2016 at Unknown time  . Alogliptin Benzoate (NESINA) 6.25 MG TABS Take 1 tablet by mouth daily.   10/09/2016 at Unknown time  . ALPRAZolam (XANAX) 1 MG tablet Take 1 mg by mouth 2 (two) times daily.    10/09/2016 at Unknown time  . HYDROcodone-acetaminophen  (NORCO/VICODIN) 5-325 MG tablet Take 1 tablet by mouth every 6 (six) hours as needed for moderate pain or severe pain.    10/09/2016 at Unknown time  . metoprolol (LOPRESSOR) 50 MG tablet Take 1 tablet (50 mg total) by mouth 2 (two) times daily. 90 tablet 1 10/09/2016 at Unknown time  . zolpidem (AMBIEN) 10 MG tablet Take 10 mg by mouth at bedtime.   10/09/2016 at Unknown time    Assessment: 65 yo F presented to [facility] 11/19 with a several day hx of SOB, PND, orthopnea, and LE edema. ECHO found EF 25% (new).  Pt was transferred to [facility] for cardiac consult and cath.  Cath found multi vessel disease.  Plan for staged PCI 11/21.  Pt was only on heparin sq pre-cath, starting gtt post-cath.  Goal of Therapy:  Heparin level 0.3-0.7 units/ml Monitor platelets by anticoagulation protocol: Yes   Plan:  Heparin 8 hours after sheath pull (11/20 at 1550) At midnight, start heparin at 900 units/hr Heparin level and CBC at 0800 Heparin level and CBC daily while on heparin  Follow-up after PCI  [facility], Pharm.D., BCPS Clinical Pharmacist Pager [phone] 10/13/2016 5:01 PM

## 2016-10-13 NOTE — Progress Notes (Signed)
Pt's Potassium 3.0. Paged the doctor. Potassium IV ordered. Will continue to monitor.  Ketra Duchesne, RN

## 2016-10-13 NOTE — Progress Notes (Signed)
   10/13/16 2014  Vitals  Temp 97.7 F (36.5 C)  Temp Source Oral  BP (!) 117/57  BP Location Left Arm  BP Method Automatic  Patient Position (if appropriate) Sitting  Pulse Rate (!) 134  Pulse Rate Source Dinamap  Resp 20  Oxygen Therapy  SpO2 98 %  O2 Device Room Air  Pt's HR sustained in the 130's, pt resting in bed, no c/o pain. EKG done, showing ST. Dr. Jeannine Boga notified, no new orders made. Will continue to monitor.

## 2016-10-13 NOTE — Progress Notes (Signed)
Site area: RFA / RFV Site Prior to Removal:  Level 0 Pressure Applied For:20 min Manual: yes   Patient Status During Pull:  stable Post Pull Site:  Level 0 Post Pull Instructions Given: yes  Post Pull Pulses Present: doppler Dressing Applied: tegaderm  Bedrest begins @ 1550 till 1950 Comments:

## 2016-10-14 ENCOUNTER — Other Ambulatory Visit: Payer: Self-pay

## 2016-10-14 ENCOUNTER — Ambulatory Visit (HOSPITAL_COMMUNITY): Admission: RE | Admit: 2016-10-14 | Payer: PPO | Source: Ambulatory Visit | Admitting: Cardiovascular Disease

## 2016-10-14 ENCOUNTER — Encounter (HOSPITAL_COMMUNITY)
Admission: EM | Disposition: A | Discharge status: Home / Self Care | Payer: Self-pay | Source: Home / Self Care | Attending: Cardiology

## 2016-10-14 ENCOUNTER — Encounter (HOSPITAL_COMMUNITY): Payer: Self-pay | Admitting: Cardiovascular Disease

## 2016-10-14 DIAGNOSIS — I25110 Atherosclerotic heart disease of native coronary artery with unstable angina pectoris: Secondary | ICD-10-CM

## 2016-10-14 DIAGNOSIS — I25118 Atherosclerotic heart disease of native coronary artery with other forms of angina pectoris: Secondary | ICD-10-CM

## 2016-10-14 DIAGNOSIS — I255 Ischemic cardiomyopathy: Secondary | ICD-10-CM

## 2016-10-14 DIAGNOSIS — F341 Dysthymic disorder: Secondary | ICD-10-CM

## 2016-10-14 DIAGNOSIS — N170 Acute kidney failure with tubular necrosis: Secondary | ICD-10-CM

## 2016-10-14 DIAGNOSIS — I200 Unstable angina: Secondary | ICD-10-CM

## 2016-10-14 DIAGNOSIS — I471 Supraventricular tachycardia: Secondary | ICD-10-CM

## 2016-10-14 HISTORY — PX: CORONARY ANGIOPLASTY WITH STENT PLACEMENT: SHX49

## 2016-10-14 HISTORY — PX: CARDIAC CATHETERIZATION: SHX172

## 2016-10-14 LAB — GLUCOSE, CAPILLARY
GLUCOSE-CAPILLARY: 216 mg/dL — AB (ref 65–99)
Glucose-Capillary: 135 mg/dL — ABNORMAL HIGH (ref 65–99)
Glucose-Capillary: 120 mg/dL — ABNORMAL HIGH (ref 65–99)
Glucose-Capillary: 229 mg/dL — ABNORMAL HIGH (ref 65–99)
Glucose-Capillary: 283 mg/dL — ABNORMAL HIGH (ref 65–99)

## 2016-10-14 LAB — BASIC METABOLIC PANEL
ANION GAP: 10 (ref 5–15)
BUN: 20 mg/dL (ref 6–20)
CO2: 25 mmol/L (ref 22–32)
Calcium: 8.8 mg/dL — ABNORMAL LOW (ref 8.9–10.3)
Chloride: 101 mmol/L (ref 101–111)
Creatinine, Ser: 1.42 mg/dL — ABNORMAL HIGH (ref 0.44–1.00)
GFR calc Af Amer: 44 mL/min — ABNORMAL LOW (ref >60)
GFR, EST NON AFRICAN AMERICAN: 38 mL/min — AB (ref >60)
GLUCOSE: 135 mg/dL — AB (ref 65–99)
POTASSIUM: 4.2 mmol/L (ref 3.5–5.1)
Sodium: 136 mmol/L (ref 135–145)

## 2016-10-14 LAB — CBC
HEMATOCRIT: 36.2 % (ref 36.0–46.0)
HEMOGLOBIN: 12 g/dL (ref 12.0–15.0)
MCH: 29.3 pg (ref 26.0–34.0)
MCHC: 33.1 g/dL (ref 30.0–36.0)
MCV: 88.5 fL (ref 78.0–100.0)
Platelets: 285 10*3/uL (ref 150–400)
RBC: 4.09 MIL/uL (ref 3.87–5.11)
RDW: 14.5 % (ref 11.5–15.5)
WBC: 7.5 10*3/uL (ref 4.0–10.5)

## 2016-10-14 LAB — HEPARIN LEVEL (UNFRACTIONATED): Heparin Unfractionated: 0.21 IU/mL — ABNORMAL LOW (ref 0.30–0.70)

## 2016-10-14 LAB — POCT ACTIVATED CLOTTING TIME: Activated Clotting Time: 516 seconds

## 2016-10-14 SURGERY — CORONARY STENT INTERVENTION

## 2016-10-14 MED ORDER — FUROSEMIDE 10 MG/ML IJ SOLN
20.0000 mg | Freq: Once | INTRAMUSCULAR | Status: AC
  Administered 2016-10-14: 18:00:00 20 mg via INTRAVENOUS

## 2016-10-14 MED ORDER — SODIUM CHLORIDE 0.9 % IV SOLN: 250.0000 mL | INTRAVENOUS | Status: DC | PRN

## 2016-10-14 MED ORDER — SODIUM CHLORIDE 0.9 % WEIGHT BASED INFUSION
2.0000 mL/kg/h | INTRAVENOUS | Status: DC
  Administered 2016-10-14: 2 mL/kg/h via INTRAVENOUS

## 2016-10-14 MED ORDER — SODIUM CHLORIDE 0.9 % IV SOLN: INTRAVENOUS | Status: DC

## 2016-10-14 MED ORDER — METHYLPREDNISOLONE SODIUM SUCC 125 MG IJ SOLR
125.0000 mg | Freq: Once | INTRAMUSCULAR | Status: AC
  Administered 2016-10-14: 125 mg via INTRAVENOUS
  Filled 2016-10-14: qty 2

## 2016-10-14 MED ORDER — SODIUM CHLORIDE 0.9 % IV SOLN
INTRAVENOUS | Status: DC | PRN
  Administered 2016-10-14: 1.75 mg/kg/h via INTRAVENOUS

## 2016-10-14 MED ORDER — METOPROLOL TARTRATE 5 MG/5ML IV SOLN
INTRAVENOUS | Status: AC
  Filled 2016-10-14: qty 5

## 2016-10-14 MED ORDER — FUROSEMIDE 10 MG/ML IJ SOLN
40.0000 mg | Freq: Two times a day (BID) | INTRAMUSCULAR | Status: DC
  Administered 2016-10-15 (×2): 40 mg via INTRAVENOUS
  Filled 2016-10-14 (×2): qty 4

## 2016-10-14 MED ORDER — FENTANYL CITRATE (PF) 100 MCG/2ML IJ SOLN
INTRAMUSCULAR | Status: DC | PRN
  Administered 2016-10-14: 50 ug via INTRAVENOUS
  Administered 2016-10-14: 25 ug via INTRAVENOUS

## 2016-10-14 MED ORDER — LIDOCAINE HCL (PF) 1 % IJ SOLN
INTRAMUSCULAR | Status: DC | PRN
  Administered 2016-10-14: 15 mL

## 2016-10-14 MED ORDER — HYDRALAZINE HCL 20 MG/ML IJ SOLN: 5.0000 mg | INTRAMUSCULAR | Status: AC | PRN

## 2016-10-14 MED ORDER — HEPARIN (PORCINE) IN NACL 2-0.9 UNIT/ML-% IJ SOLN
INTRAMUSCULAR | Status: DC | PRN
  Administered 2016-10-14: 1000 mL

## 2016-10-14 MED ORDER — METOPROLOL TARTRATE 5 MG/5ML IV SOLN
5.0000 mg | Freq: Once | INTRAVENOUS | Status: AC
  Administered 2016-10-14: 5 mg via INTRAVENOUS
  Filled 2016-10-14: qty 5

## 2016-10-14 MED ORDER — IOPAMIDOL (ISOVUE-370) INJECTION 76%
INTRAVENOUS | Status: DC | PRN
  Administered 2016-10-14: 80 mL via INTRA_ARTERIAL

## 2016-10-14 MED ORDER — SODIUM CHLORIDE 0.9% FLUSH
3.0000 mL | Freq: Two times a day (BID) | INTRAVENOUS | Status: DC
  Administered 2016-10-14 – 2016-10-15 (×3): 3 mL via INTRAVENOUS

## 2016-10-14 MED ORDER — MIDAZOLAM HCL 2 MG/2ML IJ SOLN
INTRAMUSCULAR | Status: DC | PRN
  Administered 2016-10-14: 1 mg via INTRAVENOUS

## 2016-10-14 MED ORDER — METOPROLOL TARTRATE 5 MG/5ML IV SOLN
INTRAVENOUS | Status: DC | PRN
  Administered 2016-10-14: 5 mg via INTRAVENOUS

## 2016-10-14 MED ORDER — SODIUM CHLORIDE 0.9 % WEIGHT BASED INFUSION: 1.0000 mL/kg/h | INTRAVENOUS | Status: DC

## 2016-10-14 MED ORDER — FUROSEMIDE 10 MG/ML IJ SOLN
20.0000 mg | Freq: Once | INTRAMUSCULAR | Status: AC
  Administered 2016-10-14: 20 mg via INTRAVENOUS
  Filled 2016-10-14: qty 2

## 2016-10-14 MED ORDER — FUROSEMIDE 10 MG/ML IJ SOLN
INTRAMUSCULAR | Status: AC
  Filled 2016-10-14: qty 2

## 2016-10-14 MED ORDER — ASPIRIN 81 MG PO CHEW
81.0000 mg | CHEWABLE_TABLET | ORAL | Status: AC
  Administered 2016-10-14: 81 mg via ORAL
  Filled 2016-10-14: qty 1

## 2016-10-14 MED ORDER — MIDAZOLAM HCL 2 MG/2ML IJ SOLN
INTRAMUSCULAR | Status: AC
  Filled 2016-10-14: qty 2

## 2016-10-14 MED ORDER — DIPHENHYDRAMINE HCL 50 MG/ML IJ SOLN
INTRAMUSCULAR | Status: DC | PRN
  Administered 2016-10-14: 25 mg via INTRAVENOUS

## 2016-10-14 MED ORDER — SODIUM CHLORIDE 0.9% FLUSH
3.0000 mL | Freq: Two times a day (BID) | INTRAVENOUS | Status: DC
  Administered 2016-10-14: 3 mL via INTRAVENOUS

## 2016-10-14 MED ORDER — FENTANYL CITRATE (PF) 100 MCG/2ML IJ SOLN
INTRAMUSCULAR | Status: AC
  Filled 2016-10-14: qty 2

## 2016-10-14 MED ORDER — LABETALOL HCL 5 MG/ML IV SOLN: 10.0000 mg | INTRAVENOUS | Status: AC | PRN

## 2016-10-14 MED ORDER — LIDOCAINE HCL (PF) 1 % IJ SOLN
INTRAMUSCULAR | Status: AC
  Filled 2016-10-14: qty 30

## 2016-10-14 MED ORDER — BIVALIRUDIN BOLUS VIA INFUSION - CUPID
INTRAVENOUS | Status: DC | PRN
  Administered 2016-10-14: 59.7 mg via INTRAVENOUS

## 2016-10-14 MED ORDER — HEPARIN (PORCINE) IN NACL 2-0.9 UNIT/ML-% IJ SOLN
INTRAMUSCULAR | Status: AC
  Filled 2016-10-14: qty 1000

## 2016-10-14 MED ORDER — SODIUM CHLORIDE 0.9% FLUSH: 3.0000 mL | INTRAVENOUS | Status: DC | PRN

## 2016-10-14 MED ORDER — CARVEDILOL 12.5 MG PO TABS
12.5000 mg | ORAL_TABLET | Freq: Two times a day (BID) | ORAL | Status: DC
  Administered 2016-10-15 – 2016-10-16 (×3): 12.5 mg via ORAL
  Filled 2016-10-14 (×4): qty 1

## 2016-10-14 MED ORDER — DIPHENHYDRAMINE HCL 50 MG/ML IJ SOLN
INTRAMUSCULAR | Status: AC
  Filled 2016-10-14: qty 1

## 2016-10-14 MED ORDER — BIVALIRUDIN 250 MG IV SOLR
INTRAVENOUS | Status: AC
  Filled 2016-10-14: qty 250

## 2016-10-14 SURGICAL SUPPLY — 18 items
BALLN EUPHORA RX 2.0X20 (BALLOONS) ×3
BALLN ~~LOC~~ TREK RX 2.75X20 (BALLOONS) ×3 IMPLANT
BALLOON EUPHORA RX 2.0X20 (BALLOONS) ×1 IMPLANT
CATH GUIDEZILLA II 6F (CATHETERS) IMPLANT
CATHETER GUIDEZILLA II 6F (CATHETERS) ×3
DEVICE CLOSURE PERCLS PRGLD 6F (VASCULAR PRODUCTS) ×1 IMPLANT
GUIDE CATH RUNWAY 6FR AL 75 (CATHETERS) ×3 IMPLANT
KIT ENCORE 26 ADVANTAGE (KITS) ×2 IMPLANT
KIT HEART LEFT (KITS) ×3 IMPLANT
PACK CARDIAC CATHETERIZATION (CUSTOM PROCEDURE TRAY) ×3 IMPLANT
PERCLOSE PROGLIDE 6F (VASCULAR PRODUCTS) ×3
SHEATH PINNACLE 6F 10CM (SHEATH) ×3 IMPLANT
STENT SYNERGY DES 2.5X28 (Permanent Stent) ×3 IMPLANT
STENT SYNERGY DES 2.5X32 (Permanent Stent) ×3 IMPLANT
TRANSDUCER W/STOPCOCK (MISCELLANEOUS) ×3 IMPLANT
TUBING CIL FLEX 10 FLL-RA (TUBING) ×3 IMPLANT
WIRE COUGAR XT STRL 190CM (WIRE) ×2 IMPLANT
WIRE EMERALD 3MM-J .035X150CM (WIRE) ×2 IMPLANT

## 2016-10-14 NOTE — Progress Notes (Signed)
Asked by Dr. Ellyn Hack to evaluate patient. Pt says her breathing has become less labored. She is now eating dinner. HR 125 bpm range. IV Lasix and oral metoprolol already ordered.

## 2016-10-14 NOTE — Interval H&P Note (Signed)
History and Physical Interval Note:  10/14/2016 1:02 PM  Kristen Garner  has presented today for PCI with the diagnosis of CAD/unstable angina. The various methods of treatment have been discussed with the patient and family. After consideration of risks, benefits and other options for treatment, the patient has consented to  Procedure(s): Coronary Stent Intervention (N/A) as a surgical intervention .  The patient's history has been reviewed, patient examined, no change in status, stable for surgery.  I have reviewed the patient's chart and labs.  Questions were answered to the patient's satisfaction.    Cath Lab Visit (complete for each Cath Lab visit)  Clinical Evaluation Leading to the Procedure:   ACS: Yes.    Non-ACS:    Anginal Classification: CCS III  Anti-ischemic medical therapy: Minimal Therapy (1 class of medications)  Non-Invasive Test Results: No non-invasive testing performed  Prior CABG: No previous CABG        Lauree Chandler

## 2016-10-14 NOTE — Progress Notes (Addendum)
ANTICOAGULATION CONSULT NOTE - Follow Up Consult  Pharmacy Consult for Heparin Indication: chest pain/ACS  Allergies  Allergen Reactions  . Codeine Itching  . Penicillins Other (See Comments)    Child hood allergy  . Sulfonamide Derivatives Itching  . Iodine     Patient states that she got a cat scratch and her mom put iodine on it and then got catch scratch fever    Patient Measurements: Height: 5\' 3"  (160 cm) Weight: 175 lb 6.4 oz (79.6 kg) IBW/kg (Calculated) : 52.4 Heparin Dosing Weight: 70 kg  Vital Signs: Temp: 98.4 F (36.9 C) (11/21 0547) Temp Source: Oral (11/21 0547) BP: 119/68 (11/21 0547) Pulse Rate: 95 (11/21 0547)  Labs:  Recent Labs  10/12/16 1629 10/13/16 0331 10/14/16 0527 10/14/16 0716  HGB  --  11.1* 12.0  --   HCT  --  33.7* 36.2  --   PLT  --  250 285  --   LABPROT  --  13.4  --   --   INR  --  1.02  --   --   HEPARINUNFRC  --   --   --  0.21*  CREATININE 1.61* 1.47* 1.42*  --     Estimated Creatinine Clearance: 39.5 mL/min (by C-G formula based on SCr of 1.42 mg/dL (H)).   Medications:  Infusions:  . sodium chloride    . heparin 900 Units/hr (10/14/16 0021)    Assessment: 65 year old female admitted with ACS found to have multi-vessel CAD with planned PCI later today around 12:30 PM. Initial heparin level on 900 units/hr is sub-therapeutic at 0.21 after restart at midnight 8 hours post sheath removal. CBC is stable. No bleeding noted.   Goal of Therapy:  Heparin level 0.3-0.7 units/ml Monitor platelets by anticoagulation protocol: Yes   Plan:  Increase heparin to 1050 units/hr Follow-up post PCI for plan  Daily heparin level and CBC while on therapy  Sloan Leiter, PharmD, BCPS Clinical Pharmacist [phone] until 3:30 PM [phone] after hours 10/14/2016,8:48 AM   Addendum: PCI postponed due to renal function. Will recheck heparin level this PM to ensure at goal as we do not yet have a therapeutic level.  Sloan Leiter,  PharmD, BCPS Clinical Pharmacist 10/14/2016, 12:12 PM

## 2016-10-14 NOTE — Progress Notes (Signed)
Patient arrived from cath holding in no distress.  She shortly developed atrial tachycardia and increasing SOB.  Patient supported and medicated.  Oxygen applied.  Condition did not improve.  Had PA Bhagat assess patient.  Dr. Ellyn Hack came as well.  Patient given IV lasix, IV metoprolol, and ativan.  Symptoms steadily resolving.

## 2016-10-14 NOTE — H&P (View-Only) (Signed)
Patient Name: Kristen Garner Date of Encounter: 10/14/2016  Primary Cardiologist: Dr. Sundra Aland Problem List     Principal Problem:   Acute on chronic combined systolic and diastolic CHF (congestive heart failure) (HCC) Active Problems:   Elevated troponin   Atherosclerotic heart disease of native coronary artery with other forms of angina pectoris (HCC)   Atrial tachycardia (HCC)   Acute renal failure (HCC)   Hypokalemia   Essential hypertension   Troponin level elevated   ANXIETY DEPRESSION   SOB (shortness of breath)   Unifocal PVCs   GERD (gastroesophageal reflux disease)   Cardiomyopathy, ischemic     Subjective   Feels well, answered her questions about cath.   Inpatient Medications    Scheduled Meds: . anastrozole  1 mg Oral Daily  . aspirin EC  81 mg Oral Daily  . atorvastatin  80 mg Oral q1800  . carvedilol  6.25 mg Oral BID WC  . clopidogrel  75 mg Oral Daily  . famotidine (PEPCID) IV  20 mg Intravenous Once  . insulin aspart  0-15 Units Subcutaneous TID WC  . insulin aspart  0-5 Units Subcutaneous QHS  . insulin glargine  15 Units Subcutaneous Daily  . isosorbide-hydrALAZINE  1 tablet Oral TID  . methylPREDNISolone (SOLU-MEDROL) injection  125 mg Intravenous Once  . pantoprazole  40 mg Oral BID AC  . sodium chloride flush  3 mL Intravenous Q12H  . sodium chloride flush  3 mL Intravenous Q12H  . zolpidem  5 mg Oral QHS   Continuous Infusions: . sodium chloride 1 mL/kg/hr (10/14/16 1010)  . heparin 1,050 Units/hr (10/14/16 1009)   PRN Meds: sodium chloride, sodium chloride, albuterol, LORazepam, [DISCONTINUED] ondansetron **OR** ondansetron (ZOFRAN) IV, sodium chloride flush, sodium chloride flush   Vital Signs    Vitals:   10/13/16 1633 10/13/16 2014 10/13/16 2118 10/14/16 0547  BP: 123/74 (!) 117/57  119/68  Pulse: (!) 118 (!) 134  95  Resp: 18 20  18   Temp: 98.2 F (36.8 C) 97.7 F (36.5 C)  98.4 F (36.9 C)  TempSrc: Oral  Oral  Oral  SpO2: 98% 98% 97% 97%  Weight:    79.6 kg (175 lb 6.4 oz)  Height:        Intake/Output Summary (Last 24 hours) at 10/14/16 1142 Last data filed at 10/14/16 2353  Gross per 24 hour  Intake            295.8 ml  Output              240 ml  Net             55.8 ml   Filed Weights   10/12/16 1516 10/13/16 0526 10/14/16 0547  Weight: 78.7 kg (173 lb 8 oz) 78.9 kg (174 lb) 79.6 kg (175 lb 6.4 oz)    Physical Exam   GEN: Well nourished, well developed, in no acute distress.  HEENT: Grossly normal.  Neck: Supple, no JVD, carotid bruits, or masses. Cardiac: RRR, no murmurs, rubs, or gallops. No clubbing, cyanosis, edema.  Radials/DP/PT 2+ and equal bilaterally.  Respiratory:  Respirations regular and unlabored, clear to auscultation bilaterally. GI: Soft, nontender, nondistended, BS + x 4. MS: no deformity or atrophy. Skin: warm and dry, no rash. Neuro:  Strength and sensation are intact. Psych: AAOx3.  Normal affect.  Labs    CBC  Recent Labs  10/13/16 0331 10/14/16 0527  WBC 6.8 7.5  NEUTROABS 4.5  --  HGB 11.1* 12.0  HCT 33.7* 36.2  MCV 88.0 88.5  PLT 250 973   Basic Metabolic Panel  Recent Labs  10/12/16 0607  10/13/16 0331 10/14/16 0527  NA 139  < > 138 136  K 2.6*  < > 3.0* 4.2  CL 99*  < > 102 101  CO2 29  < > 27 25  GLUCOSE 77  < > 99 135*  BUN 13  < > 13 20  CREATININE 1.31*  < > 1.47* 1.42*  CALCIUM 8.2*  < > 8.0* 8.8*  MG 2.1  --   --   --   < > = values in this interval not displayed. Liver Function Tests   Telemetry    NSR - Personally Reviewed  ECG    NSR with nonspecific ST changes in the anteroseptal leads- Personally Reviewed  Radiology    No results found.  Cardiac Studies  Transthoracic Echocardiography 10/11/16 Study Conclusions  - Left ventricle: The cavity size was mildly dilated. Wall thickness was increased in a pattern of mild LVH. Systolic function was severely reduced. The estimated ejection  fraction was in the range of 20% to 25%. Diffuse hypokinesis. There is akinesis of the inferolateral myocardium. Doppler parameters are consistent with abnormal left ventricular relaxation (grade 1 diastolic dysfunction). - Mitral valve: There was mild regurgitation. - Left atrium: The atrium was moderately dilated.  Impressions:  - Severe global reduction in LV systolic function; mild LVH and LVE; grade 1 diastolic dysfunction; moderate LAE; mild MR.  Left Heart Cath and Coronary Angiography 10/13/16  Ost RCA to Prox RCA lesion, 60 %stenosed.  Mid RCA-2 lesion, 70 %stenosed.  Mid RCA-1 lesion, 99 %stenosed.  Dist RCA lesion, 100 %stenosed.  Ost RPDA to RPDA lesion, 30 %stenosed.  1st Mrg lesion, 80 %stenosed.  Ost 2nd Mrg to 2nd Mrg lesion, 50 %stenosed.  Mid Cx to Dist Cx lesion, 80 %stenosed.  Ost 1st Diag to 1st Diag lesion, 50 %stenosed.  2nd Diag lesion, 60 %stenosed.  Ost LAD to Dist LAD lesion, 30 %stenosed.   1. Triple vessel CAD 2. Severe diffuse stenosis in the proximal, mid and distal RCA with mild to moderate calcification. Chronic occlusion of the posterolateral branch.  3. Diffuse mild stenosis LAD 4. Moderate stenosis Diagonal branch 5. Severe diffuse stenosis in the small caliber mid AV groove Circumflex and small caliber obtuse marginal branch.   Recommendations: Will plan staged PCI of the RCA tomorrow if renal function stable. This is being delayed given her chronic kidney disease. Will plan medical management of the Circumflex and diagonal disease.    Patient Profile     This is a 65yo WF with a history of T2DM, HTN, depression, breast CA and hyperlipidemia who presented to [facility] with a several day history of SOB, PND, orthopnea and LE edema. Chest xray in ER showed interstitial edema and she was admitted to the hospitalist service with CHF. Her EKG showed lateral T wave changes and trop was mildly elevated at 0.09. BNP was 496. She  was started on IV Lasix and 2d echo was done which showed severe LVF dysfunction with EF 25% and diffuse HK. She was transferred to [facility] for further evaluation and cath.  Cath report above. Plan for staged PCI to RCA today.    Assessment & Plan   Principal Problem:   Acute on chronic combined systolic and diastolic CHF (congestive heart failure) (HCC) Active Problems:   Elevated troponin   Atherosclerotic heart disease of  native coronary artery with other forms of angina pectoris (HCC)   Atrial tachycardia (HCC)   Acute renal failure (HCC)   Hypokalemia   Essential hypertension   Troponin level elevated   ANXIETY DEPRESSION   SOB (shortness of breath)   Unifocal PVCs   GERD (gastroesophageal reflux disease)   Cardiomyopathy, ischemic   1. Acute Combined systolic and diastolic CHF.EF 25%. She had noprevious echo. Her SOB and LE edema have resolved with diuresis. Neg 1.7 L. Weight up but relatively stable. Has been getting gentle IV hydration prior to cath.   - Will continue Bi-dil, beta blocker. - fluid and salt restriction, daily weights, monitor intake and output. - likely restart diuretic post Cath. - No ACE/ARB due to mild renal insufficiency - using Bidil for now - convert if renal Fxn stable to ARB. - plan staged RCA PCI was discussed with the patient fully. The patient understands that risks include but are not limited to stroke (1 in 1000), death (1 in 30), kidney failure [usually temporary] (1 in 500), bleeding (1 in 200), allergic reaction [possibly serious] (1 in 200). The patient understands and is willing to proceed.   2. Hypertension and PVCs on EKG.  - Continue beta blocker, Bidil  3. Mildly elevated troponin with flat trend (0.06/0.08/0.09). EKG shows nonspecific IVCD with inferior and anterior Q waves concerning for multivessel CAD especially in presence of severe LV dysfunction. Chest CT showed coronary artery calcifications. She denies any history of CP.    -- Cath with MV CAD - but most pronounced RCA disease.  Cx vessels are relatively small & likely not good PCI targets -- would consider OP Myoview in ~ 3 months to assess for active ischemia (unless Sx occur sooner).  4. Mildly prolonged QTC: in setting of low mag and K+. Resolved.   5. Anxiety depression. Continue Xanax as needed, SSRI   6. GERD. On PPI.   7. Mild AKI - Hold further diuretics for nowand monitor renal function. Will need to restart once she has recovered from cath-contrast load.    8. Mediastinal lymphadenopathy - outpt PCP follow-up.  9. Severe hypokalemia. IV and oral replacement ordered, magnesium stable.   10. DM type II. Due to pending left heart catheterization, possible dye exposure, mild renal insufficiency she has been changed toLantus from Amaryl and continued onsliding scale and monitor CBGs. HbG A1C 8.5. Will ask diabetic coordinator to see tomorrow.    11. Atrial Tachycardia - noted on intra-Cath tele.  Difficult to determine Afib vs. Atach.  Will continue to monitor of Tele - would check EKG if tachycardia recurs. -- on Coreg, several PRN BB doses yesterday - may need to increase BB standing.   Signed, Arbutus Leas, NP  10/14/2016, 11:42 AM    I have seen, examined and evaluated the patient this AM along with Jettie Booze, NP.  After reviewing all the available data and chart, we discussed the patients laboratory, study & physical findings as well as symptoms in detail. I agree with her findings, examination as well as impression recommendations as per our discussion.  (my addenda noted in [place])  Plan is staged PCI to RCA today (not done yesterday to conserve contrast & likely difficult PCI).  With low EF - will need standing diuretic.   Will also need to follow the A Tachycardia - ?? If this could be partially responsible for Cardiomyopathy that appears out of proportion with her CAD.  RCA PCI will help, but would not  change EF  dramatically.    Glenetta Hew, M.D., M.S. Interventional Cardiologist   Pager # [phone] Phone # [phone] [address] [place], [place] [address]

## 2016-10-14 NOTE — Care Management Note (Signed)
Case Management Note  Patient Details  Name: Kristen Garner MRN: [id number] Date of Birth: 1951-01-29  Subjective/Objective:     S/p coronary stent, will be on plavix and asa, NCM will cont to follow for dc needs.                Action/Plan:   Expected Discharge Date:  10/10/16               Expected Discharge Plan:  Home/Self Care  In-House Referral:     Discharge planning Services  CM Consult  Post Acute Care Choice:    Choice offered to:     DME Arranged:    DME Agency:     HH Arranged:    HH Agency:     Status of Service:  In process, will continue to follow  If discussed at Long Length of Stay Meetings, dates discussed:    Additional Comments:  Zenon Mayo, RN 10/14/2016, 5:31 PM

## 2016-10-14 NOTE — Progress Notes (Signed)
   Called to BS - recurrent Atrial Tachycardia with significant dyspnea --  IV Lopressor 5 mg & 40mg  IV Lasix ordered.  Suspect with post-cath fluids & recurrent Atach, she has had some decompensation of CHF.     Will order standing IV Lasix for tomorrow & increase BB to 12.5 mg bid starting tonite.  Glenetta Hew, MD

## 2016-10-14 NOTE — Progress Notes (Signed)
Patient Name: Kristen Garner Date of Encounter: 10/14/2016  Primary Cardiologist: Dr. Sundra Aland Problem List     Principal Problem:   Acute on chronic combined systolic and diastolic CHF (congestive heart failure) (HCC) Active Problems:   Elevated troponin   Atherosclerotic heart disease of native coronary artery with other forms of angina pectoris (HCC)   Atrial tachycardia (HCC)   Acute renal failure (HCC)   Hypokalemia   Essential hypertension   Troponin level elevated   ANXIETY DEPRESSION   SOB (shortness of breath)   Unifocal PVCs   GERD (gastroesophageal reflux disease)   Cardiomyopathy, ischemic     Subjective   Feels well, answered her questions about cath.   Inpatient Medications    Scheduled Meds: . anastrozole  1 mg Oral Daily  . aspirin EC  81 mg Oral Daily  . atorvastatin  80 mg Oral q1800  . carvedilol  6.25 mg Oral BID WC  . clopidogrel  75 mg Oral Daily  . famotidine (PEPCID) IV  20 mg Intravenous Once  . insulin aspart  0-15 Units Subcutaneous TID WC  . insulin aspart  0-5 Units Subcutaneous QHS  . insulin glargine  15 Units Subcutaneous Daily  . isosorbide-hydrALAZINE  1 tablet Oral TID  . methylPREDNISolone (SOLU-MEDROL) injection  125 mg Intravenous Once  . pantoprazole  40 mg Oral BID AC  . sodium chloride flush  3 mL Intravenous Q12H  . sodium chloride flush  3 mL Intravenous Q12H  . zolpidem  5 mg Oral QHS   Continuous Infusions: . sodium chloride 1 mL/kg/hr (10/14/16 1010)  . heparin 1,050 Units/hr (10/14/16 1009)   PRN Meds: sodium chloride, sodium chloride, albuterol, LORazepam, [DISCONTINUED] ondansetron **OR** ondansetron (ZOFRAN) IV, sodium chloride flush, sodium chloride flush   Vital Signs    Vitals:   10/13/16 1633 10/13/16 2014 10/13/16 2118 10/14/16 0547  BP: 123/74 (!) 117/57  119/68  Pulse: (!) 118 (!) 134  95  Resp: 18 20  18   Temp: 98.2 F (36.8 C) 97.7 F (36.5 C)  98.4 F (36.9 C)  TempSrc: Oral  Oral  Oral  SpO2: 98% 98% 97% 97%  Weight:    79.6 kg (175 lb 6.4 oz)  Height:        Intake/Output Summary (Last 24 hours) at 10/14/16 1142 Last data filed at 10/14/16 2119  Gross per 24 hour  Intake            295.8 ml  Output              240 ml  Net             55.8 ml   Filed Weights   10/12/16 1516 10/13/16 0526 10/14/16 0547  Weight: 78.7 kg (173 lb 8 oz) 78.9 kg (174 lb) 79.6 kg (175 lb 6.4 oz)    Physical Exam   GEN: Well nourished, well developed, in no acute distress.  HEENT: Grossly normal.  Neck: Supple, no JVD, carotid bruits, or masses. Cardiac: RRR, no murmurs, rubs, or gallops. No clubbing, cyanosis, edema.  Radials/DP/PT 2+ and equal bilaterally.  Respiratory:  Respirations regular and unlabored, clear to auscultation bilaterally. GI: Soft, nontender, nondistended, BS + x 4. MS: no deformity or atrophy. Skin: warm and dry, no rash. Neuro:  Strength and sensation are intact. Psych: AAOx3.  Normal affect.  Labs    CBC  Recent Labs  10/13/16 0331 10/14/16 0527  WBC 6.8 7.5  NEUTROABS 4.5  --  HGB 11.1* 12.0  HCT 33.7* 36.2  MCV 88.0 88.5  PLT 250 094   Basic Metabolic Panel  Recent Labs  10/12/16 0607  10/13/16 0331 10/14/16 0527  NA 139  < > 138 136  K 2.6*  < > 3.0* 4.2  CL 99*  < > 102 101  CO2 29  < > 27 25  GLUCOSE 77  < > 99 135*  BUN 13  < > 13 20  CREATININE 1.31*  < > 1.47* 1.42*  CALCIUM 8.2*  < > 8.0* 8.8*  MG 2.1  --   --   --   < > = values in this interval not displayed. Liver Function Tests   Telemetry    NSR - Personally Reviewed  ECG    NSR with nonspecific ST changes in the anteroseptal leads- Personally Reviewed  Radiology    No results found.  Cardiac Studies  Transthoracic Echocardiography 10/11/16 Study Conclusions  - Left ventricle: The cavity size was mildly dilated. Wall thickness was increased in a pattern of mild LVH. Systolic function was severely reduced. The estimated ejection  fraction was in the range of 20% to 25%. Diffuse hypokinesis. There is akinesis of the inferolateral myocardium. Doppler parameters are consistent with abnormal left ventricular relaxation (grade 1 diastolic dysfunction). - Mitral valve: There was mild regurgitation. - Left atrium: The atrium was moderately dilated.  Impressions:  - Severe global reduction in LV systolic function; mild LVH and LVE; grade 1 diastolic dysfunction; moderate LAE; mild MR.  Left Heart Cath and Coronary Angiography 10/13/16  Ost RCA to Prox RCA lesion, 60 %stenosed.  Mid RCA-2 lesion, 70 %stenosed.  Mid RCA-1 lesion, 99 %stenosed.  Dist RCA lesion, 100 %stenosed.  Ost RPDA to RPDA lesion, 30 %stenosed.  1st Mrg lesion, 80 %stenosed.  Ost 2nd Mrg to 2nd Mrg lesion, 50 %stenosed.  Mid Cx to Dist Cx lesion, 80 %stenosed.  Ost 1st Diag to 1st Diag lesion, 50 %stenosed.  2nd Diag lesion, 60 %stenosed.  Ost LAD to Dist LAD lesion, 30 %stenosed.   1. Triple vessel CAD 2. Severe diffuse stenosis in the proximal, mid and distal RCA with mild to moderate calcification. Chronic occlusion of the posterolateral branch.  3. Diffuse mild stenosis LAD 4. Moderate stenosis Diagonal branch 5. Severe diffuse stenosis in the small caliber mid AV groove Circumflex and small caliber obtuse marginal branch.   Recommendations: Will plan staged PCI of the RCA tomorrow if renal function stable. This is being delayed given her chronic kidney disease. Will plan medical management of the Circumflex and diagonal disease.    Patient Profile     This is a 65yo WF with a history of T2DM, HTN, depression, breast CA and hyperlipidemia who presented to [facility] with a several day history of SOB, PND, orthopnea and LE edema. Chest xray in ER showed interstitial edema and she was admitted to the hospitalist service with CHF. Her EKG showed lateral T wave changes and trop was mildly elevated at 0.09. BNP was 496. She  was started on IV Lasix and 2d echo was done which showed severe LVF dysfunction with EF 25% and diffuse HK. She was transferred to [facility] for further evaluation and cath.  Cath report above. Plan for staged PCI to RCA today.    Assessment & Plan   Principal Problem:   Acute on chronic combined systolic and diastolic CHF (congestive heart failure) (HCC) Active Problems:   Elevated troponin   Atherosclerotic heart disease of  native coronary artery with other forms of angina pectoris (HCC)   Atrial tachycardia (HCC)   Acute renal failure (HCC)   Hypokalemia   Essential hypertension   Troponin level elevated   ANXIETY DEPRESSION   SOB (shortness of breath)   Unifocal PVCs   GERD (gastroesophageal reflux disease)   Cardiomyopathy, ischemic   1. Acute Combined systolic and diastolic CHF.EF 25%. She had noprevious echo. Her SOB and LE edema have resolved with diuresis. Neg 1.7 L. Weight up but relatively stable. Has been getting gentle IV hydration prior to cath.   - Will continue Bi-dil, beta blocker. - fluid and salt restriction, daily weights, monitor intake and output. - likely restart diuretic post Cath. - No ACE/ARB due to mild renal insufficiency - using Bidil for now - convert if renal Fxn stable to ARB. - plan staged RCA PCI was discussed with the patient fully. The patient understands that risks include but are not limited to stroke (1 in 1000), death (1 in 12), kidney failure [usually temporary] (1 in 500), bleeding (1 in 200), allergic reaction [possibly serious] (1 in 200). The patient understands and is willing to proceed.   2. Hypertension and PVCs on EKG.  - Continue beta blocker, Bidil  3. Mildly elevated troponin with flat trend (0.06/0.08/0.09). EKG shows nonspecific IVCD with inferior and anterior Q waves concerning for multivessel CAD especially in presence of severe LV dysfunction. Chest CT showed coronary artery calcifications. She denies any history of CP.    -- Cath with MV CAD - but most pronounced RCA disease.  Cx vessels are relatively small & likely not good PCI targets -- would consider OP Myoview in ~ 3 months to assess for active ischemia (unless Sx occur sooner).  4. Mildly prolonged QTC: in setting of low mag and K+. Resolved.   5. Anxiety depression. Continue Xanax as needed, SSRI   6. GERD. On PPI.   7. Mild AKI - Hold further diuretics for nowand monitor renal function. Will need to restart once she has recovered from cath-contrast load.    8. Mediastinal lymphadenopathy - outpt PCP follow-up.  9. Severe hypokalemia. IV and oral replacement ordered, magnesium stable.   10. DM type II. Due to pending left heart catheterization, possible dye exposure, mild renal insufficiency she has been changed toLantus from Amaryl and continued onsliding scale and monitor CBGs. HbG A1C 8.5. Will ask diabetic coordinator to see tomorrow.    11. Atrial Tachycardia - noted on intra-Cath tele.  Difficult to determine Afib vs. Atach.  Will continue to monitor of Tele - would check EKG if tachycardia recurs. -- on Coreg, several PRN BB doses yesterday - may need to increase BB standing.   Signed, Arbutus Leas, NP  10/14/2016, 11:42 AM    I have seen, examined and evaluated the patient this AM along with Jettie Booze, NP.  After reviewing all the available data and chart, we discussed the patients laboratory, study & physical findings as well as symptoms in detail. I agree with her findings, examination as well as impression recommendations as per our discussion.  (my addenda noted in [place])  Plan is staged PCI to RCA today (not done yesterday to conserve contrast & likely difficult PCI).  With low EF - will need standing diuretic.   Will also need to follow the A Tachycardia - ?? If this could be partially responsible for Cardiomyopathy that appears out of proportion with her CAD.  RCA PCI will help, but would not  change EF  dramatically.    Glenetta Hew, M.D., M.S. Interventional Cardiologist   Pager # [phone] Phone # [phone] [address] [place], [place] [address]

## 2016-10-15 ENCOUNTER — Encounter (HOSPITAL_COMMUNITY): Payer: Self-pay | Admitting: Cardiovascular Disease

## 2016-10-15 DIAGNOSIS — I200 Unstable angina: Secondary | ICD-10-CM

## 2016-10-15 LAB — GLUCOSE, CAPILLARY
GLUCOSE-CAPILLARY: 121 mg/dL — AB (ref 65–99)
GLUCOSE-CAPILLARY: 116 mg/dL — AB (ref 65–99)
GLUCOSE-CAPILLARY: 150 mg/dL — AB (ref 65–99)
Glucose-Capillary: 121 mg/dL — ABNORMAL HIGH (ref 65–99)

## 2016-10-15 LAB — BASIC METABOLIC PANEL
ANION GAP: 10 (ref 5–15)
BUN: 21 mg/dL — ABNORMAL HIGH (ref 6–20)
CHLORIDE: 102 mmol/L (ref 101–111)
CO2: 25 mmol/L (ref 22–32)
Calcium: 8 mg/dL — ABNORMAL LOW (ref 8.9–10.3)
Creatinine, Ser: 1.33 mg/dL — ABNORMAL HIGH (ref 0.44–1.00)
GFR calc Af Amer: 47 mL/min — ABNORMAL LOW (ref >60)
GFR, EST NON AFRICAN AMERICAN: 41 mL/min — AB (ref >60)
GLUCOSE: 140 mg/dL — AB (ref 65–99)
POTASSIUM: 3.8 mmol/L (ref 3.5–5.1)
SODIUM: 137 mmol/L (ref 135–145)

## 2016-10-15 LAB — CBC
HEMATOCRIT: 31.1 % — AB (ref 36.0–46.0)
Hemoglobin: 10.2 g/dL — ABNORMAL LOW (ref 12.0–15.0)
MCH: 29 pg (ref 26.0–34.0)
MCHC: 32.8 g/dL (ref 30.0–36.0)
MCV: 88.4 fL (ref 78.0–100.0)
Platelets: 258 10*3/uL (ref 150–400)
RBC: 3.52 MIL/uL — ABNORMAL LOW (ref 3.87–5.11)
RDW: 14.8 % (ref 11.5–15.5)
WBC: 8.8 10*3/uL (ref 4.0–10.5)

## 2016-10-15 MED ORDER — ANGIOPLASTY BOOK
Freq: Once | Status: DC
  Filled 2016-10-15: qty 1

## 2016-10-15 MED ORDER — POTASSIUM CHLORIDE ER 10 MEQ PO TBCR
20.0000 meq | EXTENDED_RELEASE_TABLET | Freq: Every day | ORAL | Status: DC
Start: 2016-10-15 — End: 2016-10-16
  Administered 2016-10-15 – 2016-10-16 (×2): 20 meq via ORAL
  Filled 2016-10-15 (×3): qty 2

## 2016-10-15 NOTE — Care Management Important Message (Signed)
Important Message  Patient Details  Name: Kristen Garner MRN: [id number] Date of Birth: 1950/12/19   Medicare Important Message Given:  Yes    Orbie Pyo 10/15/2016, 1:26 PM

## 2016-10-15 NOTE — Care Management Note (Signed)
Case Management Note  Patient Details  Name: IVADELL GAUL MRN: [id number] Date of Birth: 22-Jul-1951  Subjective/Objective:   S/p coronary stent, will be on plavix and asa, pta indep, she states she does not need any HH services, NCM will cont to follow for dc needs.                              Action/Plan:   Expected Discharge Date:  10/10/16               Expected Discharge Plan:  Home/Self Care  In-House Referral:     Discharge planning Services  CM Consult  Post Acute Care Choice:    Choice offered to:     DME Arranged:    DME Agency:     HH Arranged:    HH Agency:     Status of Service:  Completed, signed off  If discussed at [employer] of Stay Meetings, dates discussed:    Additional Comments:  Zenon Mayo, RN 10/15/2016, 9:16 AM

## 2016-10-15 NOTE — Progress Notes (Signed)
CARDIAC REHAB PHASE I   PRE:  Rate/Rhythm: 86 SR  BP:  Sitting: 129/59       SaO2: 95 RA  MODE:  Ambulation: 160 ft   POST:  Rate/Rhythm: 125 atrial tach  BP:  Sitting: 126/82         SaO2: 95 RA  Pt ambulated 160 ft on RA, handheld assist, gait belt, mildly unsteady gait, tolerated fair.  Pt c/o of mild DOE, denies cp, dizziness, standing rest x1. Some paroxysmal atrial tachycardia noted. Completed PCI/stent/CHF education with pt at bedside.  Reviewed risk factors, PCI book, anti-platelet therapy, stent card, activity restrictions, ntg, exercise, heart healthy diet, carb counting, portion control, sodium restriction, CHF booklet and zone tool, daily weights and phase 2 cardiac rehab. Pt verbalized understanding, needs reinforcement as able as pt is somewhat anxious. Pt agrees to phase 2 cardiac rehab referral, will send to [facility] per pt request. Pt to recliner after walk, call bell within reach.    [place], RN, BSN 10/15/2016 9:17 AM

## 2016-10-15 NOTE — Progress Notes (Signed)
Patient Name: Kristen Garner Date of Encounter: 10/15/2016  Primary Cardiologist: Seen initially by Dr. Radford Pax. However she will need to follow-up at the [facility] Problem List     Principal Problem:   Acute on chronic combined systolic and diastolic CHF (congestive heart failure) (HCC) Active Problems:   ANXIETY DEPRESSION   Acute renal failure (HCC)   Hypokalemia   Essential hypertension   SOB (shortness of breath)   Unifocal PVCs   GERD (gastroesophageal reflux disease)   Troponin level elevated   Elevated troponin   Cardiomyopathy, ischemic   Atherosclerotic heart disease of native coronary artery with other forms of angina pectoris (HCC)   Atrial tachycardia (HCC)   Unstable angina ([place])    Subjective   Feels well, denies chest pain and SOB.    Inpatient Medications    Scheduled Meds: . anastrozole  1 mg Oral Daily  . angioplasty book   Does not apply Once  . aspirin EC  81 mg Oral Daily  . atorvastatin  80 mg Oral q1800  . carvedilol  12.5 mg Oral BID WC  . clopidogrel  75 mg Oral Daily  . famotidine (PEPCID) IV  20 mg Intravenous Once  . furosemide  40 mg Intravenous BID  . insulin aspart  0-15 Units Subcutaneous TID WC  . insulin aspart  0-5 Units Subcutaneous QHS  . insulin glargine  15 Units Subcutaneous Daily  . isosorbide-hydrALAZINE  1 tablet Oral TID  . pantoprazole  40 mg Oral BID AC  . sodium chloride flush  3 mL Intravenous Q12H  . sodium chloride flush  3 mL Intravenous Q12H  . zolpidem  5 mg Oral QHS   Continuous Infusions:  PRN Meds: sodium chloride, sodium chloride, albuterol, LORazepam, [DISCONTINUED] ondansetron **OR** ondansetron (ZOFRAN) IV, sodium chloride flush, sodium chloride flush   Vital Signs    Vitals:   10/15/16 0128 10/15/16 0441 10/15/16 0600 10/15/16 0802  BP: 130/84  114/63 (!) 125/59  Pulse: 87  79   Resp: 17  19 18   Temp: 97.7 F (36.5 C)  97.4 F (36.3 C) 98.2 F (36.8 C)  TempSrc: Oral   Oral Oral  SpO2: 93%  99% 98%  Weight:  174 lb 2.6 oz (79 kg)    Height:        Intake/Output Summary (Last 24 hours) at 10/15/16 0845 Last data filed at 10/15/16 0745  Gross per 24 hour  Intake           752.67 ml  Output             1950 ml  Net         -1197.33 ml   Filed Weights   10/13/16 0526 10/14/16 0547 10/15/16 0441  Weight: 174 lb (78.9 kg) 175 lb 6.4 oz (79.6 kg) 174 lb 2.6 oz (79 kg)    Physical Exam    GEN: Well nourished, well developed, in no acute distress.  HEENT: Grossly normal.  Neck: Supple, no JVD, carotid bruits, or masses. Cardiac: RRR, no murmurs, rubs, or gallops. No clubbing, cyanosis, edema.  Radials/DP/PT 2+ and equal bilaterally.  Respiratory:  Respirations regular and unlabored, clear to auscultation bilaterally. GI: Soft, nontender, nondistended, BS + x 4. MS: no deformity or atrophy. Skin: warm and dry, no rash. Neuro:  Strength and sensation are intact. Psych: AAOx3.  Normal affect.  Labs    CBC  Recent Labs  10/13/16 0331 10/14/16 0527 10/15/16 0344  WBC 6.8  7.5 8.8  NEUTROABS 4.5  --   --   HGB 11.1* 12.0 10.2*  HCT 33.7* 36.2 31.1*  MCV 88.0 88.5 88.4  PLT 250 285 127   Basic Metabolic Panel  Recent Labs  10/14/16 0527 10/15/16 0344  NA 136 137  K 4.2 3.8  CL 101 102  CO2 25 25  GLUCOSE 135* 140*  BUN 20 21*  CREATININE 1.42* 1.33*  CALCIUM 8.8* 8.0*     Telemetry    NSR - Personally Reviewed  ECG     Sinus tach with inferolateral ST depression- Personally Reviewed  Radiology    No results found.  Cardiac Studies   Left Heart Cath and Coronary Angiography 10/13/16  Ost RCA to Prox RCA lesion, 60 %stenosed.  Mid RCA-2 lesion, 70 %stenosed.  Mid RCA-1 lesion, 99 %stenosed.  Dist RCA lesion, 100 %stenosed.  Ost RPDA to RPDA lesion, 30 %stenosed.  1st Mrg lesion, 80 %stenosed.  Ost 2nd Mrg to 2nd Mrg lesion, 50 %stenosed.  Mid Cx to Dist Cx lesion, 80 %stenosed.  Ost 1st Diag to 1st Diag  lesion, 50 %stenosed.  2nd Diag lesion, 60 %stenosed.  Ost LAD to Dist LAD lesion, 30 %stenosed.   1. Triple vessel CAD 2. Severe diffuse stenosis in the proximal, mid and distal RCA with mild to moderate calcification. Chronic occlusion of the posterolateral branch.  3. Diffuse mild stenosis LAD 4. Moderate stenosis Diagonal branch 5. Severe diffuse stenosis in the small caliber mid AV groove Circumflex and small caliber obtuse marginal branch.   Recommendations: Will plan staged PCI of the RCA tomorrow if renal function stable. This is being delayed given her chronic kidney disease. Will plan medical management of the Circumflex and diagonal disease.    Coronary Stent Intervention 10/14/16  Mid RCA-2 lesion, 70 %stenosed.  Dist RCA lesion, 100 %stenosed.  Ost RPDA to RPDA lesion, 30 %stenosed.  A STENT SYNERGY DES 2.5X28 drug eluting stent was successfully placed.  Mid RCA-1 lesion, 99 %stenosed.  Post intervention, there is a 0% residual stenosis.  A STENT SYNERGY DES 2.5X32 drug eluting stent was successfully placed, and overlaps previously placed stent.  Ost RCA to Prox RCA lesion, 60 %stenosed.  Post intervention, there is a 0% residual stenosis.   1. Severe stenosis proximal/mid RCA 2. Successful PTCA/DES x 2 proximal and mid RCA  Recommendations: Continue DAPT with ASA and Plavix for one year. Continue statin and beta blocker.     Patient Profile     Kristen Garner is a 65 year old female with a past medical history of T2DM, HTN, depression, breast CA and hyperlipidemia who presented to [facility] with a several day history of SOB, PND, orthopnea and LE edema. Chest xray in ER showed interstitial edema and she was admitted to the hospitalist service with CHF. Her EKG showed lateral T wave changes and trop was mildly elevated at 0.09. BNP was 496. She was started on IV Lasix and 2d echo was done which showed severe LVF dysfunction with EF 25% and diffuse HK. [facility] and [facility].  Had staged PTCA/DES x 2 to proximal and mid RCA yesterday.    Assessment & Plan    Acute on chronic combined systolic and diastolic heart failure: -- We have held diuresis for her catheterization, but I think she need at least 1 day of IV Lasix followed by by mouth.  Cardiomyopathy: Unsure if this is all ischemic versus combined ischemic and nonischemic.  The RCA disease by itself would not necessarily explain her drop in EF at low.  She does have a complete occlusion of her right posterolateral branch, but now is notably improved flow in her native RCA to the PDA.   1. CAD s/p DES x 2 to RCA: Chest pain free. She will need to continue ASA and Plavix for one year. Will also continue her high intensity statin and beta blocker.   2. Atrial tachycardia: increased Coreg to 12.5mg  BID, Still with bursts of PAT. Patient is asymptomatic.but we need to observe her today.  May need when necessary short-acting beta blocker.  3. Hypertension: Stable on current regimen.   4. HLD: Continue high intensity statin.   5. Anxiety depression. Continue Xanax as needed, SSRI   6. GERD.On PPI.   7. Mild AKI- seems to be stable now. Monitor while on diuretic. Would like to convert from BiDil to ARB or discharge or as an outpatient.  8. Mediastinal lymphadenopathy- outpt PCP follow-up.  9. Severe hypokalemia. Now resolved. She may require potassium supplementation while on IV Lasix.  Oral 20 mEq ordered.  10. DM type II. she has been changed toLantus from Amaryl and continued onsliding scale and monitor CBGs. HbG A1C 8.5. We can probably go back to home medications from discharge. I do suspect however that she will need better glycemic control as an outpatient for her PCP.   Signed, Arbutus Leas, NP  10/15/2016, 8:45 AM   I have seen, examined and evaluated the patient this AM along with Jettie Booze, NP.  After reviewing all the  available data and chart, we discussed the patients laboratory, study & physical findings as well as symptoms in detail. I agree with her findings, examination as well as impression recommendations as per our discussion.    She actually looks much better today. She had a prolonged episode of atrial tachycardia last night finally resolved after getting her oral beta blocker. She also had some pretty significant heart failure symptoms of volume overload from being in the tachycardia along with post IV Fluids. Diuresed well overnight, and slept well once she got some medicines to help her sleep. She now feels notably improved, but is very leery of having another episode like last night.  She is on stable medications for her CAD with statin, aspirin and Plavix. I reviewed the telemetry data from yesterday with Dr. Rayann Heman from EP felt like this was atrial tachycardia. He recommended beta blockade - just simply need to monitor her at least another night to make sure she has no more episodes. I think she probably would benefit from having a when necessary medication as well. We may also consider digoxin if her renal function stabilizes.     Glenetta Hew, M.D., M.S. Interventional Cardiologist   Pager # [phone] Phone # [phone] [address] [place], [place] [address]

## 2016-10-15 NOTE — Progress Notes (Signed)
Nutrition Education Note  RD consulted for nutrition education regarding a Heart Healthy diabetes diet.    Lab Results  Component Value Date   HGBA1C 8.5 (H) 10/10/2016    RD provided "Heart Healthy Nutrition Therapy" and "Carbohydrate Counting for People with Diabetes" handouts from the Academy of [employer] and Dietetics. Reviewed patient's dietary recall. Provided examples on ways to decrease sodium and fat intake in diet. Discouraged intake of processed foods and use of salt shaker. Encouraged fresh fruits and vegetables as well as whole grain sources of carbohydrates to maximize fiber intake. Teach back method used.  Discussed different food groups and their effects on blood sugar, emphasizing carbohydrate-containing foods. Provided list of carbohydrates and recommended serving sizes of common foods. Expect fair compliance.  Body mass index is 30.85 kg/m. Pt meets criteria for class 1 obesity based on current BMI.  Current diet order is CHO modified heart healthy, patient is consuming approximately 100% of meals at this time. Labs and medications reviewed. No further nutrition interventions warranted at this time. RD contact information provided. If additional nutrition issues arise, please re-consult RD.   Molli Barrows, RD, LDN, [place] Pager [phone] After Hours Pager [phone]

## 2016-10-16 ENCOUNTER — Other Ambulatory Visit: Payer: Self-pay

## 2016-10-16 ENCOUNTER — Encounter (HOSPITAL_COMMUNITY): Payer: Self-pay | Admitting: Student

## 2016-10-16 LAB — CBC
HCT: 30.3 % — ABNORMAL LOW (ref 36.0–46.0)
HEMOGLOBIN: 10.1 g/dL — AB (ref 12.0–15.0)
MCH: 29.5 pg (ref 26.0–34.0)
MCHC: 33.3 g/dL (ref 30.0–36.0)
MCV: 88.6 fL (ref 78.0–100.0)
PLATELETS: 230 10*3/uL (ref 150–400)
RBC: 3.42 MIL/uL — AB (ref 3.87–5.11)
RDW: 14.7 % (ref 11.5–15.5)
WBC: 8.2 10*3/uL (ref 4.0–10.5)

## 2016-10-16 LAB — GLUCOSE, CAPILLARY
GLUCOSE-CAPILLARY: 147 mg/dL — AB (ref 65–99)
GLUCOSE-CAPILLARY: 88 mg/dL (ref 65–99)

## 2016-10-16 MED ORDER — PANTOPRAZOLE SODIUM 40 MG PO TBEC: 40.0000 mg | DELAYED_RELEASE_TABLET | Freq: Two times a day (BID) | ORAL | 6 refills | Status: DC

## 2016-10-16 MED ORDER — ATORVASTATIN CALCIUM 80 MG PO TABS: 80.0000 mg | ORAL_TABLET | Freq: Every day | ORAL | 6 refills | Status: DC

## 2016-10-16 MED ORDER — POTASSIUM CHLORIDE ER 20 MEQ PO TBCR: 20.0000 meq | EXTENDED_RELEASE_TABLET | Freq: Every day | ORAL | 6 refills | Status: DC

## 2016-10-16 MED ORDER — CARVEDILOL 12.5 MG PO TABS: 12.5000 mg | ORAL_TABLET | Freq: Two times a day (BID) | ORAL | 6 refills | Status: DC

## 2016-10-16 MED ORDER — CLOPIDOGREL BISULFATE 75 MG PO TABS: 75.0000 mg | ORAL_TABLET | Freq: Every day | ORAL | 6 refills | Status: DC

## 2016-10-16 MED ORDER — FUROSEMIDE 40 MG PO TABS
40.0000 mg | ORAL_TABLET | Freq: Every day | ORAL | Status: DC
  Filled 2016-10-16: qty 1

## 2016-10-16 MED ORDER — ASPIRIN 81 MG PO TBEC: 81.0000 mg | DELAYED_RELEASE_TABLET | Freq: Every day | ORAL | Status: DC

## 2016-10-16 MED ORDER — FUROSEMIDE 40 MG PO TABS: 40.0000 mg | ORAL_TABLET | Freq: Every day | ORAL | 6 refills | Status: DC

## 2016-10-16 MED ORDER — ISOSORB DINITRATE-HYDRALAZINE 20-37.5 MG PO TABS: 1.0000 | ORAL_TABLET | Freq: Three times a day (TID) | ORAL | 6 refills | Status: DC

## 2016-10-16 NOTE — Progress Notes (Signed)
Patient Name: Kristen Garner Date of Encounter: 10/16/2016  Primary Cardiologist: Seen initially by Dr. Radford Pax. However she will need to follow-up at the [facility] Problem List     Principal Problem:   Acute on chronic combined systolic and diastolic CHF (congestive heart failure) (HCC) Active Problems:   ANXIETY DEPRESSION   Acute renal failure (HCC)   Hypokalemia   Essential hypertension   SOB (shortness of breath)   Unifocal PVCs   GERD (gastroesophageal reflux disease)   Troponin level elevated   Elevated troponin   Cardiomyopathy, ischemic   Atherosclerotic heart disease of native coronary artery with other forms of angina pectoris (HCC)   Atrial tachycardia (HCC)   Unstable angina ([place])    Subjective   Feels well, denies chest pain and SOB.   Has ambulated with cardiac rehab.     Inpatient Medications    Scheduled Meds: . angioplasty book   Does not apply Once  . aspirin EC  81 mg Oral Daily  . atorvastatin  80 mg Oral q1800  . carvedilol  12.5 mg Oral BID WC  . clopidogrel  75 mg Oral Daily  . famotidine (PEPCID) IV  20 mg Intravenous Once  . furosemide  40 mg Intravenous BID  . insulin aspart  0-15 Units Subcutaneous TID WC  . insulin aspart  0-5 Units Subcutaneous QHS  . insulin glargine  15 Units Subcutaneous Daily  . isosorbide-hydrALAZINE  1 tablet Oral TID  . pantoprazole  40 mg Oral BID AC  . potassium chloride  20 mEq Oral Daily  . sodium chloride flush  3 mL Intravenous Q12H  . sodium chloride flush  3 mL Intravenous Q12H  . zolpidem  5 mg Oral QHS   Continuous Infusions:  PRN Meds: sodium chloride, sodium chloride, albuterol, LORazepam, [DISCONTINUED] ondansetron **OR** ondansetron (ZOFRAN) IV, sodium chloride flush, sodium chloride flush   Vital Signs    Vitals:   10/15/16 1508 10/15/16 1926 10/16/16 0607 10/16/16 0813  BP: (!) 113/46 (!) 117/57 (!) 130/51 124/68  Pulse: 78 86 82 85  Resp: 15 (!) 25 (!) 25 17    Temp: 98 F (36.7 C) 97.7 F (36.5 C) 98.2 F (36.8 C)   TempSrc: Oral Oral Oral   SpO2: 94% 96% 93% 94%  Weight:   171 lb 15.3 oz (78 kg)   Height:        Intake/Output Summary (Last 24 hours) at 10/16/16 0910 Last data filed at 10/16/16 0000  Gross per 24 hour  Intake              960 ml  Output             1450 ml  Net             -490 ml   Filed Weights   10/14/16 0547 10/15/16 0441 10/16/16 0607  Weight: 175 lb 6.4 oz (79.6 kg) 174 lb 2.6 oz (79 kg) 171 lb 15.3 oz (78 kg)    Physical Exam    GEN: Well nourished, well developed, in no acute distress.  HEENT: Grossly normal.  Neck: Supple, no JVD, carotid bruits, or masses. Cardiac: RRR, no murmurs, rubs, or gallops. No clubbing, cyanosis, edema.  Radials/DP/PT 2+ and equal bilaterally.  Respiratory:  Respirations regular and unlabored, clear to auscultation bilaterally. GI: Soft, nontender, nondistended, BS + x 4. MS: no deformity or atrophy. Skin: warm and dry, no rash. Neuro:  Strength and sensation are intact. Psych:  AAOx3.  Normal affect.  Labs    CBC  Recent Labs  10/15/16 0344 10/16/16 0215  WBC 8.8 8.2  HGB 10.2* 10.1*  HCT 31.1* 30.3*  MCV 88.4 88.6  PLT 258 357   Basic Metabolic Panel  Recent Labs  10/14/16 0527 10/15/16 0344  NA 136 137  K 4.2 3.8  CL 101 102  CO2 25 25  GLUCOSE 135* 140*  BUN 20 21*  CREATININE 1.42* 1.33*  CALCIUM 8.8* 8.0*     Telemetry    NSR - Personally Reviewed  ECG     Sinus tach with inferolateral ST depression- Personally Reviewed  Radiology    No results found.  Cardiac Studies   Left Heart Cath and Coronary Angiography 10/13/16  Ost RCA to Prox RCA lesion, 60 %stenosed.  Mid RCA-2 lesion, 70 %stenosed.  Mid RCA-1 lesion, 99 %stenosed.  Dist RCA lesion, 100 %stenosed.  Ost RPDA to RPDA lesion, 30 %stenosed.  1st Mrg lesion, 80 %stenosed.  Ost 2nd Mrg to 2nd Mrg lesion, 50 %stenosed.  Mid Cx to Dist Cx lesion, 80  %stenosed.  Ost 1st Diag to 1st Diag lesion, 50 %stenosed.  2nd Diag lesion, 60 %stenosed.  Ost LAD to Dist LAD lesion, 30 %stenosed.   1. Triple vessel CAD 2. Severe diffuse stenosis in the proximal, mid and distal RCA with mild to moderate calcification. Chronic occlusion of the posterolateral branch.  3. Diffuse mild stenosis LAD 4. Moderate stenosis Diagonal branch 5. Severe diffuse stenosis in the small caliber mid AV groove Circumflex and small caliber obtuse marginal branch.    Coronary Stent Intervention 10/14/16  Mid RCA-2 lesion, 70 %stenosed.  Dist RCA lesion, 100 %stenosed.  Ost RPDA to RPDA lesion, 30 %stenosed.  A STENT SYNERGY DES 2.5X28 drug eluting stent was successfully placed.  Mid RCA-1 lesion, 99 %stenosed.  Post intervention, there is a 0% residual stenosis.  A STENT SYNERGY DES 2.5X32 drug eluting stent was successfully placed, and overlaps previously placed stent.  Ost RCA to Prox RCA lesion, 60 %stenosed.  Post intervention, there is a 0% residual stenosis.   1. Severe stenosis proximal/mid RCA 2. Successful PTCA/DES x 2 proximal and mid RCA  Recommendations: Continue DAPT with ASA and Plavix for one year. Continue statin and beta blocker.     Patient Profile     Kristen Garner is a 65 year old female with a past medical history of T2DM, HTN, depression, breast CA and hyperlipidemia who presented to [facility] with a several day history of SOB, PND, orthopnea and LE edema. Chest xray in ER showed interstitial edema and she was admitted to the hospitalist service with CHF. Her EKG showed lateral T wave changes and trop was mildly elevated at 0.09. BNP was 496. She was started on IV Lasix and 2d echo was done which showed severe LVF dysfunction with EF 25% and diffuse HK. [facility]  Had staged PTCA/DES x 2 to proximal and mid RCA on 11/21.    Assessment & Plan    Acute on chronic combined  systolic and diastolic heart failure: --   Have changed the IV lasix to PO    Cardiomyopathy: Unsure if this is all ischemic versus combined ischemic and nonischemic. The RCA disease by itself would not necessarily explain her drop in EF at low.  She does have a complete occlusion of her right posterolateral branch, but now is notably improved flow in her native RCA to  the PDA.   1. CAD s/p DES x 2 to RCA: Chest pain free. She will need to continue ASA and Plavix for one year. Will also continue her high intensity statin and beta blocker.   2. Atrial tachycardia: increased Coreg to 12.5mg  BID, Still with bursts of PAT. Patient is asymptomatic.but we need to observe her today.  May need when necessary short-acting beta blocker.  3. Hypertension: Stable on current regimen.   4. HLD: Continue high intensity statin.   5. Anxiety depression. Continue Xanax as needed, SSRI   6. GERD.On PPI.   7. Mild AKI- seems to be stable now. Monitor while on diuretic. Would like to convert from BiDil to ARB or discharge or as an outpatient.  8. Mediastinal lymphadenopathy- outpt PCP follow-up.  9. Severe hypokalemia. Now resolved. She may require potassium supplementation while on IV Lasix.  Oral 20 mEq ordered.  10. DM type II. she has been changed toLantus from Amaryl and continued onsliding scale and monitor CBGs. HbG A1C 8.5. We can probably go back to home medications from discharge. I do suspect however that she will need better glycemic control as an outpatient for her PCP.   OK to go home today .    Mertie Moores, MD  10/16/2016 9:12 AM    [place],  [place], [place]  [address] Pager [phone] Phone: [phone]; Fax: [phone]

## 2016-10-16 NOTE — Discharge Summary (Signed)
Discharge Summary    Patient ID: Kristen Garner,  MRN: [id number], DOB/AGE: Sep 08, 1951 65 y.o.  Admit date: 10/10/2016 Discharge date: 10/16/2016  Primary Care Provider: Sharilyn Sites CABOT Primary Cardiologist: Follow-up in [place], [place]  Discharge Diagnoses    Principal Problem:   Acute on chronic combined systolic and diastolic CHF (congestive heart failure) ([place]) Active Problems:   ANXIETY DEPRESSION   Acute renal failure (HCC)   Hypokalemia   Essential hypertension   SOB (shortness of breath)   Unifocal PVCs   GERD (gastroesophageal reflux disease)   Troponin level elevated   Elevated troponin   Cardiomyopathy, ischemic   Atherosclerotic heart disease of native coronary artery with other forms of angina pectoris ([place])   Atrial tachycardia ([place])   Unstable angina ([place])   History of Present Illness     Kristen Garner is a 65 y.o. female with past medical history of HTN, HLD, Type 2 DM, Stage 3 CKD, and remote history of breast cancer who initially presented to [facility] on 10/10/2016 for evaluation of worsening dyspnea, orthopnea, and lower extremity edema.   BNP was elevated to 496 and troponin was at 0.09. She was diuresed with IV Lasix and an echocardiogram was performed which showed a reduced EF of 20-25% with diffuse hypokinesis.   She was therefore transferred to [facility] on 10/12/2016 for further evaluation.   Hospital Course     Consultants: None   She went for cardiac catheterization on 10/13/2016 which showed 3-vessel CAD with severe diffuse stenosis in the proximal, mid and distal RCA with mild to moderate calcification, chronic occlusion of the posterolateral branch, diffuse mild LAD stenosis, and moderate stenosis along with diagonal branch. Will her CKD, staged PCI of the RCA was recommended for the following day.   She therefore went back to the catheterization lab on 11/21 and underwent PCI with Synergy DES placement x2  to the proximal and mid-RCA. She has been started on DAPT with ASA and Plavix. She tolerated the procedure well and no immediate complications were noted. She did experience brief episodes of atrial tachycardia following the procedure requiring IV Lopressor and Lasix dosing. Her Coreg was increased from 6.25mg  BID to 12.5mg  BID.    On 10/15/2016, she denied any repeat episodes of chest discomfort. She still appeared mildly volume overloaded and was therefore continued on IV Lasix for an additional day.   She continued to improve and on 11/23 was felt to be close to baseline, denying any anginal symptoms and being euvolemic on examination. In regards to her cardiomyopathy, she has been started on Bi-dil 20-37.5mg  TID, Coreg 12.5mg  BID, and PO Lasix 40mg  daily along with K+ supplementation.  ACE-I/ARB/ARNI were avoided with her mild renal insufficiency. Creatinine was stable at 1.33 at last check and weight was at 171 lbs (down from 174 lbs at time of admission). Her A1c was elevated to 8.5 this admission, and she has therefore been instructed to follow-up with her PCP in regards to further adjustment of her anti-glycemic medication regimen.   She was last examined by Dr. Acie Fredrickson and deemed stable for discharge. A staff message has been sent to the [facility] office to arrange for a 7-day TOC appointment. She will need a repeat CBC and BMET at that time. She was discharged home in good condition. _____________  Discharge Vitals Blood pressure 124/68, pulse 85, temperature 98.2 F (36.8 C), temperature source Oral, resp. rate 17, height 5\' 3"  (1.6 m), weight 171  lb 15.3 oz (78 kg), SpO2 94 %.  Filed Weights   10/14/16 0547 10/15/16 0441 10/16/16 0607  Weight: 175 lb 6.4 oz (79.6 kg) 174 lb 2.6 oz (79 kg) 171 lb 15.3 oz (78 kg)    Labs & Radiologic Studies     CBC  Recent Labs  10/15/16 0344 10/16/16 0215  WBC 8.8 8.2  HGB 10.2* 10.1*  HCT 31.1* 30.3*  MCV 88.4 88.6  PLT 258 809   Basic  Metabolic Panel  Recent Labs  10/14/16 0527 10/15/16 0344  NA 136 137  K 4.2 3.8  CL 101 102  CO2 25 25  GLUCOSE 135* 140*  BUN 20 21*  CREATININE 1.42* 1.33*  CALCIUM 8.8* 8.0*   Liver Function Tests No results for input(s): AST, ALT, ALKPHOS, BILITOT, PROT, ALBUMIN in the last 72 hours. No results for input(s): LIPASE, AMYLASE in the last 72 hours. Cardiac Enzymes No results for input(s): CKTOTAL, CKMB, CKMBINDEX, TROPONINI in the last 72 hours. BNP Invalid input(s): POCBNP D-Dimer No results for input(s): DDIMER in the last 72 hours. Hemoglobin A1C No results for input(s): HGBA1C in the last 72 hours. Fasting Lipid Panel No results for input(s): CHOL, HDL, LDLCALC, TRIG, CHOLHDL, LDLDIRECT in the last 72 hours. Thyroid Function Tests No results for input(s): TSH, T4TOTAL, T3FREE, THYROIDAB in the last 72 hours.  Invalid input(s): FREET3  Dg Chest 2 View  Result Date: 10/10/2016 CLINICAL DATA:  Shortness of breath. EXAM: CHEST  2 VIEW COMPARISON:  Chest x-ray dated 06/12/2010 FINDINGS: The patient has small bilateral pleural effusions with bilateral interstitial pulmonary edema. Pulmonary vascularity is normal and the heart size is within normal limits. Calcification and tortuosity of the thoracic aorta. No acute bone abnormality. IMPRESSION: Findings consistent with slight interstitial edema and small bilateral pleural effusions of unknown etiology. Electronically Signed   By: Lorriane Shire M.D.   On: 10/10/2016 17:19   Ct Angio Chest Pe W/cm &/or Wo Cm  Result Date: 10/10/2016 CLINICAL DATA:  Acute onset of shortness of breath. Initial encounter. EXAM: CT ANGIOGRAPHY CHEST WITH CONTRAST TECHNIQUE: Multidetector CT imaging of the chest was performed using the standard protocol during bolus administration of intravenous contrast. Multiplanar CT image reconstructions and MIPs were obtained to evaluate the vascular anatomy. CONTRAST:  100 mL of Isovue 370 IV contrast  COMPARISON:  Chest radiograph performed earlier today at 5:04 p.m. FINDINGS: Cardiovascular:  There is no evidence of pulmonary embolus. Mild left atrial enlargement is noted. Scattered coronary artery calcification is seen. Mild calcification is noted along the aortic arch. The great vessels are grossly unremarkable in appearance. Mediastinum/Nodes: A 1.4 cm aortopulmonary window node is seen. No additional mediastinal lymphadenopathy appreciated. No pericardial effusion is identified. The thyroid gland is grossly unremarkable. No axillary lymphadenopathy is appreciated. Lungs/Pleura: Small bilateral pleural effusions are noted. Underlying interstitial prominence is seen, with mild bibasilar opacities, likely reflecting pulmonary edema. No pneumothorax is identified. No dominant masses are seen. Upper Abdomen: The visualized portions of the liver and spleen are unremarkable. The visualized portions of the pancreas and adrenal glands are within normal limits. Musculoskeletal: No acute osseous abnormalities are identified. The visualized musculature is unremarkable in appearance. Review of the MIP images confirms the above findings. IMPRESSION: 1. No evidence of pulmonary embolus. 2. Small bilateral pleural effusions. Underlying interstitial prominence, with mild bibasilar opacities, likely reflecting pulmonary edema. 3. Mild left atrial enlargement noted. 4. Scattered coronary artery calcifications seen. 5. 1.4 cm aortopulmonary window node seen, of uncertain significance.  Electronically Signed   By: Garald Balding M.D.   On: 10/10/2016 20:52     Diagnostic Studies/Procedures    Cardiac Catheterization: 10/13/2016  Ost RCA to Prox RCA lesion, 60 %stenosed.  Mid RCA-2 lesion, 70 %stenosed.  Mid RCA-1 lesion, 99 %stenosed.  Dist RCA lesion, 100 %stenosed.  Ost RPDA to RPDA lesion, 30 %stenosed.  1st Mrg lesion, 80 %stenosed.  Ost 2nd Mrg to 2nd Mrg lesion, 50 %stenosed.  Mid Cx to Dist Cx lesion,  80 %stenosed.  Ost 1st Diag to 1st Diag lesion, 50 %stenosed.  2nd Diag lesion, 60 %stenosed.  Ost LAD to Dist LAD lesion, 30 %stenosed.   1. Triple vessel CAD 2. Severe diffuse stenosis in the proximal, mid and distal RCA with mild to moderate calcification. Chronic occlusion of the posterolateral branch.  3. Diffuse mild stenosis LAD 4. Moderate stenosis Diagonal branch 5. Severe diffuse stenosis in the small caliber mid AV groove Circumflex and small caliber obtuse marginal branch.   Recommendations: Will plan staged PCI of the RCA tomorrow if renal function stable. This is being delayed given her chronic kidney disease. Will plan medical management of the Circumflex and diagonal disease.   10/14/2016: Coronary Stent Intervention  Mid RCA-2 lesion, 70 %stenosed.  Dist RCA lesion, 100 %stenosed.  Ost RPDA to RPDA lesion, 30 %stenosed.  A STENT SYNERGY DES 2.5X28 drug eluting stent was successfully placed.  Mid RCA-1 lesion, 99 %stenosed.  Post intervention, there is a 0% residual stenosis.  A STENT SYNERGY DES 2.5X32 drug eluting stent was successfully placed, and overlaps previously placed stent.  Ost RCA to Prox RCA lesion, 60 %stenosed.  Post intervention, there is a 0% residual stenosis.   1. Severe stenosis proximal/mid RCA 2. Successful PTCA/DES x 2 proximal and mid RCA  Recommendations: Continue DAPT with ASA and Plavix for one year. Continue statin and beta blocker.    Echocardiogram:  Study Conclusions  - Left ventricle: The cavity size was mildly dilated. Wall   thickness was increased in a pattern of mild LVH. Systolic   function was severely reduced. The estimated ejection fraction   was in the range of 20% to 25%. Diffuse hypokinesis. There is   akinesis of the inferolateral myocardium. Doppler parameters are   consistent with abnormal left ventricular relaxation (grade 1   diastolic dysfunction). - Mitral valve: There was mild regurgitation. -  Left atrium: The atrium was moderately dilated.  Impressions:  - Severe global reduction in LV systolic function; mild LVH and   LVE; grade 1 diastolic dysfunction; moderate LAE; mild MR.  Disposition   Pt is being discharged home today in good condition.  Follow-up Plans & Appointments    Follow-up Information    [facility].   Specialty:  Cardiology Why:  The office will call to arrange follow-up. If you do not hear from them in 3 business days, please call the number provided. Contact information: [place] [phone]         Discharge Instructions    Amb Referral to Cardiac Rehabilitation    Complete by:  As directed    Diagnosis:  Coronary Stents   Diet - low sodium heart healthy    Complete by:  As directed    Discharge instructions    Complete by:  As directed    PLEASE REMEMBER TO BRING ALL OF YOUR MEDICATIONS TO EACH OF YOUR FOLLOW-UP OFFICE VISITS.  PLEASE ATTEND ALL SCHEDULED FOLLOW-UP  APPOINTMENTS.   Activity: Increase activity slowly as tolerated. You may shower, but no soaking baths (or swimming) for 1 week. No driving for 24 hours. No lifting over 5 lbs for 1 week. No sexual activity for 1 week.   You May Return to Work: in 1 week (if applicable)  Wound Care: You may wash cath site gently with soap and water. Keep cath site clean and dry. If you notice pain, swelling, bleeding or pus at your cath site, please call [phone].   Increase activity slowly    Complete by:  As directed       Discharge Medications     Medication List    STOP taking these medications   metoprolol 50 MG tablet Commonly known as:  LOPRESSOR     TAKE these medications   albuterol 108 (90 Base) MCG/ACT inhaler Commonly known as:  PROVENTIL HFA;VENTOLIN HFA Inhale 2 puffs into the lungs every 6 (six) hours as needed.   ALPRAZolam 1 MG tablet Commonly known as:  XANAX Take 1 mg by mouth 2 (two) times  daily.   aspirin 81 MG EC tablet Take 1 tablet (81 mg total) by mouth daily.   atorvastatin 80 MG tablet Commonly known as:  LIPITOR Take 1 tablet (80 mg total) by mouth daily at 6 PM.   carvedilol 12.5 MG tablet Commonly known as:  COREG Take 1 tablet (12.5 mg total) by mouth 2 (two) times daily with a meal.   clopidogrel 75 MG tablet Commonly known as:  PLAVIX Take 1 tablet (75 mg total) by mouth daily.   furosemide 40 MG tablet Commonly known as:  LASIX Take 1 tablet (40 mg total) by mouth daily.   HYDROcodone-acetaminophen 5-325 MG tablet Commonly known as:  NORCO/VICODIN Take 1 tablet by mouth every 6 (six) hours as needed for moderate pain or severe pain.   isosorbide-hydrALAZINE 20-37.5 MG tablet Commonly known as:  BIDIL Take 1 tablet by mouth 3 (three) times daily.   NESINA 6.25 MG Tabs Generic drug:  Alogliptin Benzoate Take 1 tablet by mouth daily.   pantoprazole 40 MG tablet Commonly known as:  PROTONIX Take 1 tablet (40 mg total) by mouth 2 (two) times daily before a meal.   Potassium Chloride ER 20 MEQ Tbcr Take 20 mEq by mouth daily.   zolpidem 10 MG tablet Commonly known as:  AMBIEN Take 10 mg by mouth at bedtime.       Aspirin prescribed at discharge?  Yes High Intensity Statin Prescribed? (Lipitor 40-80mg  or Crestor 20-40mg ): Yes Beta Blocker Prescribed? Yes For EF 40% or less, Was ACEI/ARB Prescribed? No: AKI ADP Receptor Inhibitor Prescribed? (i.e. Plavix etc.-Includes Medically Managed Patients): Yes For EF <40%, Aldosterone Inhibitor Prescribed? No: AKI Was EF assessed during THIS hospitalization? Yes Was Cardiac Rehab II ordered? (Included Medically managed Patients): Yes   Allergies Allergies  Allergen Reactions  . Codeine Itching  . Penicillins Other (See Comments)    Child hood allergy  . Sulfonamide Derivatives Itching  . Iodine     Patient states that she got a cat scratch and her mom put iodine on it and then got catch  scratch fever     Outstanding Labs/Studies   CBC and BMET at [facility] appointment; LFT's and FLP in 6 weeks.  Duration of Discharge Encounter   Greater than 30 minutes including physician time.  Signed, Erma Heritage, PA-C 10/16/2016, 11:20 AM  Attending Note:   The patient was seen and examined.  Agree with  assessment and plan as noted above.  Changes made to the above note as needed.  Patient seen and independently examined with Bernerd Pho, [place] .   We discussed all aspects of the encounter. I agree with the assessment and plan as stated above.  Pt has done well after PCI.  Groin is bruised but appears stable     I have spent a total of 40 minutes with patient reviewing hospital  notes , telemetry, EKGs, labs and examining patient as well as establishing an assessment and plan that was discussed with the patient. > 50% of time was spent in direct patient care.    Thayer Headings, Brooke Bonito., MD, [facility] 10/16/2016, 4:57 PM [address],  [place] Pager [phone]

## 2016-10-21 ENCOUNTER — Telehealth: Payer: Self-pay

## 2016-10-21 NOTE — Telephone Encounter (Signed)
Called pt. No answer, left voicemail for pt to return call.

## 2016-10-21 NOTE — Telephone Encounter (Signed)
-----   Message from Orinda Kenner sent at 10/21/2016 11:37 AM EST ----- Regarding: TOC 7-day TOC appointment with an APP / pt d/c'd 11/23. Just received staff message this morning. Called patient to advise of appointment but had to leave message.  Thanks, [employer]

## 2016-10-22 ENCOUNTER — Encounter: Payer: Self-pay | Admitting: Physician Assistant

## 2016-10-22 NOTE — Progress Notes (Signed)
Cardiology Office Note    Date:  10/23/2016  ID:  Kristen Garner, DOB 30-Jul-1951, MRN [id number] PCP:  Purvis Kilts, MD  Cardiologist:  Will need to establish; reviewed with Dr. Bronson Ing.   Chief Complaint: f/u CHF  History of Present Illness:  Kristen Garner is a 65 y.o. female with history of HTN, HLD, Type 2 DM, ?Stage 3 CKD, remote history of breast cancer, and recently diagnosed combined CHF and CAD who presents for post-hospital follow-up. She was admitted 11/17-11/23/17 with new acute CHF and minimally elevated troponin. 2D echo 10/11/16 showed EF 20-25%, mild LVH, diffuse HK, akinesis of inferolateral myocardium, grade 1 DD, mild MR, mod LAE. She unerwent LHC 10/13/16 which showed 3-vessel CAD with severe diffuse stenosis in the proximal, mid and distal RCA with mild to moderate calcification, chronic occlusion of the posterolateral branch, diffuse mild stenosis LAD, moderate stenosis Diagonal branch, severe diffuse stenosis in the small caliber mid AV groove Circumflex and small caliber obtuse marginal branch. Given her CKD she was brought back for staged DESx2 to RCA on 10/14/16 with plan for medical management of Cx and diagonal disease. Unclear if cardiomyopathy was all ischemic in nature. She was started on DAPT with ASA + Plavix and also high dose statin. She was also diuresed and started on CHF regimen, except not on ACEI/ARB/ARNI due to renal insufficiency. Hospitalization also notable for paroxysmal atrial tach, PVCs, AKI on suspected CKD, and hypokalemia. CTA ruled out PE but showed "1.4 cm aortopulmonary window node seen, of uncertain significance" - she was recommended to f/u PCP as OP for this. Labs notable for Hgb 10.1 on day of dc, Cr 1.33, A1C 8.5, TSH wnl, no recent LFTs or lipids available.  She presents back to [facility] today alone. Her SOB is "much better" than it was before, but she does note she felt somewhat tired when making the walk from the  parking lot to the clinic. No further LEE. No orthopnea, syncope, or chest pain. EKG reveals a narrow regular tachycardia. We obtained a rhythm strip - I reviewed with Dr. Bronson Ing given concern for possible atrial flutter; he feels this is more representative of bursts of PAT (that were also noted even on day of DC in the hospital recently). Long talk about low sodium diet, daily weights, fluid restriction. She does note some fecal urgency which is new. She also reports occasional cough, staying cold "all the time," and wanting to drive again.   Past Medical History:  Diagnosis Date  . Abnormal chest CT    a. CTA 09/2016: ""1.4 cm aortopulmonary window node seen, of uncertain significance"  . Anxiety   . Breast cancer ([place])    right  . CAD (coronary artery disease)    a. 09/2016: cath showing 3-v disease with severe stenosis along RCA, CTO of PL branch, mild LAD stenosis, and moderate D1 stenosis. S/p DES x2 to RCA on 10/14/2016.  Marland Kitchen Chronic systolic CHF (congestive heart failure) ([place])    a. 09/2016: Echo showing EF of 20-25% with diffuse HK  . CKD (chronic kidney disease), stage III   . Depression   . Diabetes mellitus    takes only the pill  . Headache(784.0)    occiasional migraine  . Hyperlipidemia   . Hypertension   . Hypokalemia   . Noncompliance 01/04/2015  . Osteopenia 01/04/2015  . PAT (paroxysmal atrial tachycardia) ([place])   . PVC's (premature ventricular contractions)   . Skin cancer 2015   right  leg    Past Surgical History:  Procedure Laterality Date  . ABDOMINAL HYSTERECTOMY    . BREAST BIOPSY Right    biopsy then wide excision   . CARDIAC CATHETERIZATION N/A 10/13/2016   Procedure: Left Heart Cath and Coronary Angiography;  Surgeon: Burnell Blanks, MD;  Location: [facility];  Service: Cardiovascular;  Laterality: N/A;  . CARDIAC CATHETERIZATION N/A 10/14/2016   Procedure: Coronary Stent Intervention;  Surgeon: Burnell Blanks, MD;   Location: [facility];  Service: Cardiovascular;  Laterality: N/A;  . CHOLECYSTECTOMY    . CORONARY ANGIOPLASTY WITH STENT PLACEMENT  10/14/2016  . SENTINEL LYMPH NODE BIOPSY    . SKIN CANCER EXCISION Right 2015    Current Medications: Current Outpatient Prescriptions  Medication Sig Dispense Refill  . albuterol (PROVENTIL HFA;VENTOLIN HFA) 108 (90 BASE) MCG/ACT inhaler Inhale 2 puffs into the lungs every 6 (six) hours as needed.    . Alogliptin Benzoate (NESINA) 6.25 MG TABS Take 1 tablet by mouth daily.    Marland Kitchen ALPRAZolam (XANAX) 1 MG tablet Take 1 mg by mouth 2 (two) times daily.     Marland Kitchen aspirin EC 81 MG EC tablet Take 1 tablet (81 mg total) by mouth daily.    Marland Kitchen atorvastatin (LIPITOR) 80 MG tablet Take 1 tablet (80 mg total) by mouth daily at 6 PM. 30 tablet 6  . carvedilol (COREG) 12.5 MG tablet Take 1 tablet (12.5 mg total) by mouth 2 (two) times daily with a meal. 60 tablet 6  . clopidogrel (PLAVIX) 75 MG tablet Take 1 tablet (75 mg total) by mouth daily. 30 tablet 6  . furosemide (LASIX) 40 MG tablet Take 1 tablet (40 mg total) by mouth daily. 30 tablet 6  . HYDROcodone-acetaminophen (NORCO/VICODIN) 5-325 MG tablet Take 1 tablet by mouth every 6 (six) hours as needed for moderate pain or severe pain.     . isosorbide-hydrALAZINE (BIDIL) 20-37.5 MG tablet Take 1 tablet by mouth 3 (three) times daily. 90 tablet 6  . pantoprazole (PROTONIX) 40 MG tablet Take 1 tablet (40 mg total) by mouth 2 (two) times daily before a meal. 60 tablet 6  . potassium chloride 20 MEQ TBCR Take 20 mEq by mouth daily. 30 tablet 6  . zolpidem (AMBIEN) 10 MG tablet Take 10 mg by mouth at bedtime.     No current facility-administered medications for this visit.      Allergies:   Codeine; Penicillins; Sulfonamide derivatives; and Iodine   Social History   Social History  . Marital status: Widowed    Spouse name: N/A  . Number of children: N/A  . Years of education: N/A   Social History Main Topics    . Smoking status: Former Research scientist (life sciences)  . Smokeless tobacco: Never Used  . Alcohol use No  . Drug use: No  . Sexual activity: Yes    Birth control/ protection: Surgical   Other Topics Concern  . None   Social History Narrative  . None     Family History:  The patient's family history includes Diabetes in her father; Stroke in her maternal grandmother.   ROS:   Please see the history of present illness.  All other systems are reviewed and otherwise negative.    PHYSICAL EXAM:   VS:  BP 106/62   Pulse (!) 104   Ht 5\' 2"  (1.575 m)   Wt 173 lb (78.5 kg)   SpO2 98%   BMI 31.64 kg/m   BMI: Body  mass index is 31.64 kg/m. GEN: Well nourished, well developed obese WF, in no acute distress  HEENT: normocephalic, atraumatic Neck: no JVD, carotid bruits, or masses Cardiac: RRR with bursts of tachycardia; no murmurs, rubs, or gallops, no edema  Respiratory:  Diminished BS at bases, normal work of breathing GI: soft, nontender, nondistended, + BS MS: no deformity or atrophy  Skin: warm and dry, no rash. Right groin cath site without hematoma or bruit; mild resolving ecchymosis Neuro:  Alert and Oriented x 3, Strength and sensation are intact, follows commands Psych: euthymic mood, full affect  Wt Readings from Last 3 Encounters:  10/23/16 173 lb (78.5 kg)  10/16/16 171 lb 15.3 oz (78 kg)  01/04/15 190 lb 14.4 oz (86.6 kg)      Studies/Labs Reviewed:   EKG:  EKG was ordered today and personally reviewed by me and demonstrates tachycardia 124bpm, felt by Dr. Bronson Ing to represent PAT (see rhythm strips as well), diffuse TWI and ST sagging, prior anteroseptal infarct  - ST changes have improved compared to prior, rhythm appears similar to 11/22 tracings.  Recent Labs: 10/10/2016: B Natriuretic Peptide 496.0; TSH 2.285 10/12/2016: Magnesium 2.1 10/15/2016: BUN 21; Creatinine, Ser 1.33; Potassium 3.8; Sodium 137 10/16/2016: Hemoglobin 10.1; Platelets 230   Lipid Panel No results  found for: CHOL, TRIG, HDL, CHOLHDL, VLDL, LDLCALC, LDLDIRECT  Additional studies/ records that were reviewed today include: Summarized above.    ASSESSMENT & PLAN:   1. Chronic combined CHF - volume status appears stable on exam. She does have some decreased BS at bases thus I would continue current regimen, pending results of her labwork. Reviewed low sodium diet, fluid restriction and daily weights with patient. She reports home weight around 173 since DC. Once we have finished adjusting & optimizing medications, she will need to have a repeat echocardiogram scheduled 3 months thereafter to reassess LV function. Question if there is a component of her cardiomyopathy that is tachy-mediated related to #2. 2. Paroxysmal atrial tach - noted on repeat EKGs today. Per notes, frequent bursts and prolonged episode in the hospital. She is surprisingly asymptomatic with this. Dr. Ellyn Hack had apparently reviewed with Dr. Rayann Heman while she was in the hospital and beta blockade was recommended. Reviewed case in depth with Dr. Bronson Ing along with strips. Will d/c carvedilol and start Toprol 50mg  BID. Blood pressure may be prohibitive of aggressive med titration. If needed, at next visit, can hold Bidil to make way for additional HR control. If this remains uncontrolled, may need to review with EP to determine if AAD is needed. Will also check labs today to make sure no metabolic precipitant. Will need close f/u in a few days to recheck HR.  3. CAD - continue DAPT for now. Changing BB as above. Continue statin. As long as she is tolerating her medications (to be determined), will eventually need f/u LFTs/lipids scheduled for about 6 weeks from now. However, if her diarrhea persists may need to try cutting back on statin so I did not schedule these today, pending reassessment. 4. Essential HTN - follow with med changes.  5. CKD stage III - recheck above. 6. Abnormal CT chest - pt instructed to f/u PCP for  this. 7. Anemia - recheck CBC today. Note she is having some fecal urgency. Will reduce Protonix dose to once daily. She denies any active GERD sx.  Disposition: Will request close f/u in our office for Monday - need to make sure HR is better controlled. Patient aware this  may be scheduled at [facility] office. (If that's the case, we also have Dr. Lovena Le and Dr. Curt Bears working with EP in the office that day if curbside assistance is needed.)   Medication Adjustments/Labs and Tests Ordered: Current medicines are reviewed at length with the patient today.  Concerns regarding medicines are outlined above. Medication changes, Labs and Tests ordered today are summarized above and listed in the Patient Instructions accessible in Encounters.   Raechel Ache PA-C  10/23/2016 12:06 PM    [facility] in [place], [place] [address] Ph: [phone]; Fax [phone]

## 2016-10-23 ENCOUNTER — Ambulatory Visit (INDEPENDENT_AMBULATORY_CARE_PROVIDER_SITE_OTHER): Payer: PPO | Admitting: Physician Assistant

## 2016-10-23 ENCOUNTER — Telehealth: Payer: Self-pay

## 2016-10-23 ENCOUNTER — Other Ambulatory Visit (HOSPITAL_COMMUNITY)
Admission: RE | Admit: 2016-10-23 | Discharge: 2016-10-23 | Disposition: A | Discharge status: Home / Self Care | Payer: PPO | Source: Ambulatory Visit | Attending: Physician Assistant | Admitting: Physician Assistant

## 2016-10-23 ENCOUNTER — Encounter: Payer: Self-pay | Admitting: Physician Assistant

## 2016-10-23 VITALS — BP 106/62 | HR 104 | Ht 62.0 in | Wt 173.0 lb

## 2016-10-23 DIAGNOSIS — D649 Anemia, unspecified: Secondary | ICD-10-CM

## 2016-10-23 DIAGNOSIS — I471 Supraventricular tachycardia: Secondary | ICD-10-CM | POA: Diagnosis not present

## 2016-10-23 DIAGNOSIS — N183 Chronic kidney disease, stage 3 unspecified: Secondary | ICD-10-CM

## 2016-10-23 DIAGNOSIS — R938 Abnormal findings on diagnostic imaging of other specified body structures: Secondary | ICD-10-CM

## 2016-10-23 DIAGNOSIS — I4719 Other supraventricular tachycardia: Secondary | ICD-10-CM

## 2016-10-23 DIAGNOSIS — I10 Essential (primary) hypertension: Secondary | ICD-10-CM | POA: Insufficient documentation

## 2016-10-23 DIAGNOSIS — I2510 Atherosclerotic heart disease of native coronary artery without angina pectoris: Secondary | ICD-10-CM

## 2016-10-23 DIAGNOSIS — I5042 Chronic combined systolic (congestive) and diastolic (congestive) heart failure: Secondary | ICD-10-CM | POA: Diagnosis not present

## 2016-10-23 DIAGNOSIS — R9389 Abnormal findings on diagnostic imaging of other specified body structures: Secondary | ICD-10-CM

## 2016-10-23 LAB — CBC WITH DIFFERENTIAL/PLATELET
Basophils Absolute: 0.1 10*3/uL (ref 0.0–0.1)
Basophils Relative: 1 %
EOS ABS: 0.3 10*3/uL (ref 0.0–0.7)
Eosinophils Relative: 3 %
HCT: 35.8 % — ABNORMAL LOW (ref 36.0–46.0)
HEMOGLOBIN: 12 g/dL (ref 12.0–15.0)
LYMPHS ABS: 1.2 10*3/uL (ref 0.7–4.0)
Lymphocytes Relative: 13 %
MCH: 29.8 pg (ref 26.0–34.0)
MCHC: 33.5 g/dL (ref 30.0–36.0)
MCV: 88.8 fL (ref 78.0–100.0)
Monocytes Absolute: 0.8 10*3/uL (ref 0.1–1.0)
Monocytes Relative: 8 %
NEUTROS PCT: 75 %
Neutro Abs: 7.1 10*3/uL (ref 1.7–7.7)
Platelets: 245 10*3/uL (ref 150–400)
RBC: 4.03 MIL/uL (ref 3.87–5.11)
RDW: 13.9 % (ref 11.5–15.5)
WBC: 9.4 10*3/uL (ref 4.0–10.5)

## 2016-10-23 LAB — COMPREHENSIVE METABOLIC PANEL
ALT: 14 U/L (ref 14–54)
ANION GAP: 8 (ref 5–15)
AST: 18 U/L (ref 15–41)
Albumin: 3.8 g/dL (ref 3.5–5.0)
Alkaline Phosphatase: 101 U/L (ref 38–126)
BUN: 26 mg/dL — ABNORMAL HIGH (ref 6–20)
CHLORIDE: 100 mmol/L — AB (ref 101–111)
CO2: 26 mmol/L (ref 22–32)
CREATININE: 1.49 mg/dL — AB (ref 0.44–1.00)
Calcium: 8.9 mg/dL (ref 8.9–10.3)
GFR, EST AFRICAN AMERICAN: 41 mL/min — AB (ref >60)
GFR, EST NON AFRICAN AMERICAN: 36 mL/min — AB (ref >60)
Glucose, Bld: 207 mg/dL — ABNORMAL HIGH (ref 65–99)
POTASSIUM: 3.9 mmol/L (ref 3.5–5.1)
Sodium: 134 mmol/L — ABNORMAL LOW (ref 135–145)
Total Bilirubin: 0.6 mg/dL (ref 0.3–1.2)
Total Protein: 7.5 g/dL (ref 6.5–8.1)

## 2016-10-23 LAB — MAGNESIUM: MAGNESIUM: 1.7 mg/dL (ref 1.7–2.4)

## 2016-10-23 MED ORDER — PANTOPRAZOLE SODIUM 40 MG PO TBEC: 40.0000 mg | DELAYED_RELEASE_TABLET | Freq: Every day | ORAL | 6 refills | Status: DC

## 2016-10-23 MED ORDER — METOPROLOL SUCCINATE ER 50 MG PO TB24: 50.0000 mg | ORAL_TABLET | Freq: Two times a day (BID) | ORAL | 3 refills | Status: DC

## 2016-10-23 NOTE — Patient Instructions (Addendum)
Medication Instructions:  Stop carvedilol Start TOPROL XL 50 mg - take 1 tablet two times daily  DECREASE PROTONIX TO 40 MG ONCE DAILY  Labwork: Your physician recommends that you return for lab work in: TODAY CMET CBC MAGNESIUM   Testing/Procedures: NONE  Follow-Up: Your physician recommends that you schedule a follow-up appointment in: Monday    Any Other Special Instructions Will Be Listed Below (If Applicable).     If you need a refill on your cardiac medications before your next appointment, please call your pharmacy.

## 2016-10-23 NOTE — Telephone Encounter (Signed)
Called pt., left message for her to call back pertaining to labs.

## 2016-10-23 NOTE — Telephone Encounter (Signed)
-----   Message from Charlie Pitter, [place] sent at 10/23/2016  3:14 PM EST ----- [name] - Please call patient. Her labs show that she may be a little bit on the drier side. This could also be because of her heart racing. Would cut furosemide/Lasix in half for now. If she begins to have worsening symptoms or her weight begins to trend upward, she should call the office even over the weekend if needed. Magnesium level is borderline low. Please ask her to increase dietary intake of healthy sources of magnesium including leafy greens, nuts, seeds, fish, beans, whole grains, avocados, yogurt, and bananas. Thanks.  [Will also fwrd to Cecilie Kicks who is seeing her in close f/u on Monday - would consider repeat BMET/Mg level when she returns on Monday to further trend. She required mag repletion in the hospital since she had QT prolongation, but I do not want to add just yet in case her Cr continues to rise.] [employer] PA-C

## 2016-10-24 ENCOUNTER — Telehealth: Payer: Self-pay

## 2016-10-24 NOTE — Telephone Encounter (Signed)
Called pt, left voicemail with detailed instructions.

## 2016-10-24 NOTE — Telephone Encounter (Signed)
-----   Message from Charlie Pitter, [place] sent at 10/23/2016  3:14 PM EST ----- [name] - Please call patient. Her labs show that she may be a little bit on the drier side. This could also be because of her heart racing. Would cut furosemide/Lasix in half for now. If she begins to have worsening symptoms or her weight begins to trend upward, she should call the office even over the weekend if needed. Magnesium level is borderline low. Please ask her to increase dietary intake of healthy sources of magnesium including leafy greens, nuts, seeds, fish, beans, whole grains, avocados, yogurt, and bananas. Thanks.  [Will also fwrd to Cecilie Kicks who is seeing her in close f/u on Monday - would consider repeat BMET/Mg level when she returns on Monday to further trend. She required mag repletion in the hospital since she had QT prolongation, but I do not want to add just yet in case her Cr continues to rise.] [employer] PA-C

## 2016-10-26 NOTE — Progress Notes (Signed)
Cardiology Office Note   Date:  10/27/2016   ID:  CORRYN MADEWELL, DOB 1951/03/23, MRN [id number]  PCP:  Purvis Kilts, MD  Cardiologist:  Dr. Jacinta Shoe    Chief Complaint  Patient presents with  . Tachycardia      History of Present Illness: Kristen Garner is a 65 y.o. female who presents for tachycardia follow up.   She has a hx history of HTN, HLD, Type 2 DM, ?Stage 3 CKD, remote history of breast cancer, and recently diagnosed combined CHF and CAD who presents for post-hospital follow-up. She was admitted 11/17-11/23/17 with new acute CHF and minimally elevated troponin. 2D echo 10/11/16 showed EF 20-25%, mild LVH, diffuse HK, akinesis of inferolateral myocardium, grade 1 DD, mild MR, mod LAE. She unerwent LHC 10/13/16 which showed 3-vessel CAD with severe diffuse stenosis in the proximal, mid and distal RCA with mild to moderate calcification, chronic occlusion of the posterolateral branch, diffuse mild stenosis LAD, moderate stenosis Diagonal branch, severe diffuse stenosis in the small caliber mid AV groove Circumflex and small caliber obtuse marginal branch. Given her CKD she was brought back for staged DESx2 to RCA on 10/14/16 with plan for medical management of Cx and diagonal disease. Unclear if cardiomyopathy was all ischemic in nature. She was started on DAPT with ASA + Plavix and also high dose statin. She was also diuresed and started on CHF regimen, except not on ACEI/ARB/ARNI due to renal insufficiency. Hospitalization also notable for paroxysmal atrial tach, PVCs, AKI on suspected CKD, and hypokalemia. CTA ruled out PE but showed "1.4 cm aortopulmonary window node seen, of uncertain significance" - she was recommended to f/u PCP as OP for this. Labs notable for Hgb 10.1 on day of dc, Cr 1.33, A1C 8.5, TSH wnl, no recent LFTs or lipids available.  She was seen in [place] clinic 10/23/16 and her SOB was better, but she does note she felt somewhat tired when  making the walk from the parking lot to the clinic. No further LEE. No orthopnea, syncope, or chest pain. EKG revealed a narrow regular tachycardia. We obtained a rhythm strip - I reviewed with Dr. Bronson Ing given concern for possible atrial flutter; he feels this is more representative of bursts of PAT (that were also noted even on day of DC in the hospital recently).   Today she is following up after toprol added at 50 BID and coreg stopped.  Today BP is 90 systolic and she is fatigued.  Continues with loose stools without control.  No syncope and no chest pain also no SOB.  HR is 129.    Discussed with Dr. Burt Knack and Dr. Curt Bears.  Strips from hospital reviewed as well as previous EKGs.   Dr. Curt Bears felt this was flutter- with rate continuing to be elevated and hypotensive will adjust meds and begin anticoagulation with CHA2DS2VASc score of 6.    With recent stent she will continue ASA and Plavix.  We may stop ASA after 1 month.      Past Medical History:  Diagnosis Date  . Abnormal chest CT    a. CTA 09/2016: ""1.4 cm aortopulmonary window node seen, of uncertain significance"  . Anxiety   . Breast cancer ([place])    right  . CAD (coronary artery disease)    a. 09/2016: cath showing 3-v disease with severe stenosis along RCA, CTO of PL branch, mild LAD stenosis, and moderate D1 stenosis. S/p DES x2 to RCA on 10/14/2016.  Marland Kitchen Chronic systolic  CHF (congestive heart failure) ([place])    a. 09/2016: Echo showing EF of 20-25% with diffuse HK  . CKD (chronic kidney disease), stage III   . Depression   . Diabetes mellitus    takes only the pill  . Headache(784.0)    occiasional migraine  . Hyperlipidemia   . Hypertension   . Hypokalemia   . Noncompliance 01/04/2015  . Osteopenia 01/04/2015  . PAT (paroxysmal atrial tachycardia) ([place])   . PVC's (premature ventricular contractions)   . Skin cancer 2015   right leg    Past Surgical History:  Procedure Laterality Date  . ABDOMINAL  HYSTERECTOMY    . BREAST BIOPSY Right    biopsy then wide excision   . CARDIAC CATHETERIZATION N/A 10/13/2016   Procedure: Left Heart Cath and Coronary Angiography;  Surgeon: Burnell Blanks, MD;  Location: [facility];  Service: Cardiovascular;  Laterality: N/A;  . CARDIAC CATHETERIZATION N/A 10/14/2016   Procedure: Coronary Stent Intervention;  Surgeon: Burnell Blanks, MD;  Location: [facility];  Service: Cardiovascular;  Laterality: N/A;  . CHOLECYSTECTOMY    . CORONARY ANGIOPLASTY WITH STENT PLACEMENT  10/14/2016  . SENTINEL LYMPH NODE BIOPSY    . SKIN CANCER EXCISION Right 2015     Current Outpatient Prescriptions  Medication Sig Dispense Refill  . Alogliptin Benzoate (NESINA) 6.25 MG TABS Take 1 tablet by mouth daily.    Marland Kitchen ALPRAZolam (XANAX) 1 MG tablet Take 1 mg by mouth 2 (two) times daily.     Marland Kitchen aspirin EC 81 MG EC tablet Take 1 tablet (81 mg total) by mouth daily.    Marland Kitchen atorvastatin (LIPITOR) 80 MG tablet Take 1 tablet (80 mg total) by mouth daily at 6 PM. 30 tablet 6  . clopidogrel (PLAVIX) 75 MG tablet Take 1 tablet (75 mg total) by mouth daily. 30 tablet 6  . furosemide (LASIX) 40 MG tablet Take 1 tablet (40 mg total) by mouth daily. 30 tablet 6  . HYDROcodone-acetaminophen (NORCO/VICODIN) 5-325 MG tablet Take 1 tablet by mouth every 6 (six) hours as needed for moderate pain or severe pain.     . isosorbide-hydrALAZINE (BIDIL) 20-37.5 MG tablet Take 1 tablet by mouth 3 (three) times daily. 90 tablet 6  . metoprolol succinate (TOPROL-XL) 50 MG 24 hr tablet Take 1 tablet (50 mg total) by mouth 2 (two) times daily. Take with or immediately following a meal. 60 tablet 3  . nitroGLYCERIN (NITROSTAT) 0.4 MG SL tablet Place 0.4 mg under the tongue every 5 (five) minutes as needed for chest pain.    . pantoprazole (PROTONIX) 40 MG tablet Take 1 tablet (40 mg total) by mouth daily. 60 tablet 6  . potassium chloride 20 MEQ TBCR Take 20 mEq by mouth daily. 30  tablet 6  . zolpidem (AMBIEN) 10 MG tablet Take 10 mg by mouth at bedtime.     No current facility-administered medications for this visit.     Allergies:   Codeine; Penicillins; Sulfonamide derivatives; and Iodine    Social History:  The patient  reports that she has quit smoking. She has never used smokeless tobacco. She reports that she does not drink alcohol or use drugs.   Family History:  The patient's family history includes Diabetes in her father; Stroke in her maternal grandmother.    ROS:  General:no colds or fevers, no weight changes Skin:no rashes or ulcers HEENT:no blurred vision, no congestion CV:see HPI PUL:see HPI GI:no diarrhea constipation or melena,  no indigestion GU:no hematuria, no dysuria MS:no joint pain, no claudication Neuro:no syncope, + lightheadedness Endo:+ diabetes, no thyroid disease  Wt Readings from Last 3 Encounters:  10/27/16 170 lb 12.8 oz (77.5 kg)  10/23/16 173 lb (78.5 kg)  10/16/16 171 lb 15.3 oz (78 kg)     PHYSICAL EXAM: VS:  BP 90/60   Pulse 96   Ht 5\' 2"  (1.575 m)   Wt 170 lb 12.8 oz (77.5 kg)   BMI 31.24 kg/m  , BMI Body mass index is 31.24 kg/m. General:Pleasant affect, NAD Skin:Warm and dry, brisk capillary refill HEENT:normocephalic, sclera clear, mucus membranes moist Neck:supple, no JVD, no bruits  Heart:S1S2 RRR very rapid without murmur, gallup, rub or click Lungs:clear without rales, rhonchi, or wheezes IWP:YKDX, non tender, + BS, do not palpate liver spleen or masses Ext:no lower ext edema, 2+ pedal pulses, 2+ radial pulses Neuro:alert and oriented X 3, MAE, follows commands, + facial symmetry    EKG:  EKG is ordered today. The ekg ordered today demonstrates a flutter with review with Dr. Curt Bears and Dr. Burt Knack  Recent Labs: 10/10/2016: B Natriuretic Peptide 496.0; TSH 2.285 10/23/2016: ALT 14; BUN 26; Creatinine, Ser 1.49; Hemoglobin 12.0; Magnesium 1.7; Platelets 245; Potassium 3.9; Sodium 134    Lipid  Panel No results found for: CHOL, TRIG, HDL, CHOLHDL, VLDL, LDLCALC, LDLDIRECT     Other studies Reviewed: Additional studies/ records that were reviewed today include:  ECHO: Study Conclusions  - Left ventricle: The cavity size was mildly dilated. Wall   thickness was increased in a pattern of mild LVH. Systolic   function was severely reduced. The estimated ejection fraction   was in the range of 20% to 25%. Diffuse hypokinesis. There is   akinesis of the inferolateral myocardium. Doppler parameters are   consistent with abnormal left ventricular relaxation (grade 1   diastolic dysfunction). - Mitral valve: There was mild regurgitation. - Left atrium: The atrium was moderately dilated.  Impressions:  - Severe global reduction in LV systolic function; mild LVH and   LVE; grade 1 diastolic dysfunction; moderate LAE; mild MR.  Cardiac cath 10/13/16  Ost RCA to Prox RCA lesion, 60 %stenosed.  Mid RCA-2 lesion, 70 %stenosed.  Mid RCA-1 lesion, 99 %stenosed.  Dist RCA lesion, 100 %stenosed.  Ost RPDA to RPDA lesion, 30 %stenosed.  1st Mrg lesion, 80 %stenosed.  Ost 2nd Mrg to 2nd Mrg lesion, 50 %stenosed.  Mid Cx to Dist Cx lesion, 80 %stenosed.  Ost 1st Diag to 1st Diag lesion, 50 %stenosed.  2nd Diag lesion, 60 %stenosed.  Ost LAD to Dist LAD lesion, 30 %stenosed.   1. Triple vessel CAD 2. Severe diffuse stenosis in the proximal, mid and distal RCA with mild to moderate calcification. Chronic occlusion of the posterolateral branch.  3. Diffuse mild stenosis LAD 4. Moderate stenosis Diagonal branch 5. Severe diffuse stenosis in the small caliber mid AV groove Circumflex and small caliber obtuse marginal branch.   Recommendations: Will plan staged PCI of the RCA tomorrow if renal function stable. This is being delayed given her chronic kidney disease. Will plan medical management of the Circumflex and diagonal disease.   PCI 22/21/17 Conclusion     Mid  RCA-2 lesion, 70 %stenosed.  Dist RCA lesion, 100 %stenosed.  Ost RPDA to RPDA lesion, 30 %stenosed.  A STENT SYNERGY DES 2.5X28 drug eluting stent was successfully placed.  Mid RCA-1 lesion, 99 %stenosed.  Post intervention, there is a 0% residual stenosis.  A  STENT SYNERGY DES 2.5X32 drug eluting stent was successfully placed, and overlaps previously placed stent.  Ost RCA to Prox RCA lesion, 60 %stenosed.  Post intervention, there is a 0% residual stenosis.   1. Severe stenosis proximal/mid RCA 2. Successful PTCA/DES x 2 proximal and mid RCA  Recommendations: Continue DAPT with ASA and Plavix for one year. Continue statin and beta blocker.       ASSESSMENT AND PLAN:   1. Chronic combined CHF - volume status appears stable on exam. With hypotension I stopped BiDil and decreased lasix to 20 mg.  She will need to have a repeat echocardiogram scheduled 3 months thereafter to reassess LV function. Question if there is a component of her cardiomyopathy that is tachy-mediated related to #2. 2. Paroxysmal atrial tach -Vs A flutter, reviewed the HR 129 today with Dr. Burt Knack and Dr. Curt Bears and decided a flutter most likely cause.   Per notes, frequent bursts and prolonged episode in the hospital.  On 11/30 also with tachycardia and BB increased.  Today will continue BB at current dose and adjust meds as above.  Plan to begin eliquis 5 mg BID and then plan DCCV Thursday or Friday.  She will need EP consult with Dr. Curt Bears.   Thought would be amiodarone if DCCV does not hold. 3. CAD - continue DAPT for now. continue BB . Continue statin. As long as she is tolerating her medications (to be determined), will eventually need f/u LFTs/lipids scheduled for about 6 weeks from now. 4. Essential HTN - follow with med changes. Today hypotensive stopped bidil and decreased lasix. 5. CKD stage III - recheck 6. Abnormal CT chest - pt instructed to f/u PCP for this. 7. Anemia - recheck CBC  today. 8.  Note she is having some fecal urgency. Diarrhea - Protonix reduced to once daily . Will stop lipitor for 2 days then resume at 40 mg daily.    She will need to be seen next week for follow up if EP appt is further out.     [facility] has been requested to perform a transesophageal echocardiogram on Carolyn Stare for  A flutter for cardioversion.  After careful review of history and examination, the risks and benefits of transesophageal echocardiogram have been explained including risks of esophageal damage, perforation (1:10,000 risk), bleeding, pharyngeal hematoma as well as other potential complications associated with conscious sedation including aspiration, arrhythmia, respiratory failure and death. Alternatives to treatment were discussed, questions were answered. Patient is willing to proceed. Discussed DCCV as well.   Reviewed importance of not missing any eliquis.     Current medicines are reviewed with the patient today.  The patient Has no concerns regarding medicines.  The following changes have been made:  See above Labs/ tests ordered today include:see above  Disposition:   FU:  see above  Signed, Cecilie Kicks, NP  10/27/2016 11:27 AM    [facility], [place], [address] [place], [place] Phone: [phone]; Fax: [phone]

## 2016-10-27 ENCOUNTER — Encounter: Payer: Self-pay | Admitting: Cardiology

## 2016-10-27 ENCOUNTER — Ambulatory Visit (INDEPENDENT_AMBULATORY_CARE_PROVIDER_SITE_OTHER): Payer: PPO | Admitting: Cardiology

## 2016-10-27 ENCOUNTER — Telehealth: Payer: Self-pay | Admitting: *Deleted

## 2016-10-27 ENCOUNTER — Encounter (INDEPENDENT_AMBULATORY_CARE_PROVIDER_SITE_OTHER): Payer: Self-pay

## 2016-10-27 VITALS — BP 90/60 | HR 96 | Ht 62.0 in | Wt 170.8 lb

## 2016-10-27 DIAGNOSIS — I509 Heart failure, unspecified: Secondary | ICD-10-CM | POA: Diagnosis not present

## 2016-10-27 DIAGNOSIS — Z6828 Body mass index (BMI) 28.0-28.9, adult: Secondary | ICD-10-CM | POA: Diagnosis not present

## 2016-10-27 DIAGNOSIS — E663 Overweight: Secondary | ICD-10-CM | POA: Diagnosis not present

## 2016-10-27 DIAGNOSIS — I2510 Atherosclerotic heart disease of native coronary artery without angina pectoris: Secondary | ICD-10-CM

## 2016-10-27 DIAGNOSIS — N183 Chronic kidney disease, stage 3 unspecified: Secondary | ICD-10-CM

## 2016-10-27 DIAGNOSIS — I5042 Chronic combined systolic (congestive) and diastolic (congestive) heart failure: Secondary | ICD-10-CM

## 2016-10-27 DIAGNOSIS — I4892 Unspecified atrial flutter: Secondary | ICD-10-CM | POA: Diagnosis not present

## 2016-10-27 DIAGNOSIS — I5043 Acute on chronic combined systolic (congestive) and diastolic (congestive) heart failure: Secondary | ICD-10-CM | POA: Diagnosis not present

## 2016-10-27 DIAGNOSIS — I10 Essential (primary) hypertension: Secondary | ICD-10-CM | POA: Diagnosis not present

## 2016-10-27 DIAGNOSIS — I471 Supraventricular tachycardia: Secondary | ICD-10-CM

## 2016-10-27 DIAGNOSIS — I4719 Other supraventricular tachycardia: Secondary | ICD-10-CM

## 2016-10-27 LAB — BASIC METABOLIC PANEL WITH GFR
BUN: 19 mg/dL (ref 7–25)
CHLORIDE: 95 mmol/L — AB (ref 98–110)
CO2: 29 mmol/L (ref 20–31)
Calcium: 9.1 mg/dL (ref 8.6–10.4)
Creat: 1.52 mg/dL — ABNORMAL HIGH (ref 0.50–0.99)
GFR, EST NON AFRICAN AMERICAN: 36 mL/min — AB (ref >=60)
GFR, Est African American: 41 mL/min — ABNORMAL LOW (ref >=60)
GLUCOSE: 193 mg/dL — AB (ref 65–99)
POTASSIUM: 3.6 mmol/L (ref 3.5–5.3)
SODIUM: 138 mmol/L (ref 135–146)

## 2016-10-27 MED ORDER — FUROSEMIDE 40 MG PO TABS
20.0000 mg | ORAL_TABLET | Freq: Every day | ORAL | 6 refills | Status: DC
Start: 2016-10-27 — End: 2016-11-12

## 2016-10-27 MED ORDER — ATORVASTATIN CALCIUM 40 MG PO TABS: 40.0000 mg | ORAL_TABLET | Freq: Every day | ORAL | 3 refills | Status: DC

## 2016-10-27 MED ORDER — FUROSEMIDE 40 MG PO TABS: 20.0000 mg | ORAL_TABLET | Freq: Every day | ORAL | 6 refills | Status: DC

## 2016-10-27 MED ORDER — APIXABAN 5 MG PO TABS: 5.0000 mg | ORAL_TABLET | Freq: Two times a day (BID) | ORAL | 6 refills | Status: DC

## 2016-10-27 NOTE — Telephone Encounter (Signed)
-----   Message from Charlie Pitter, [place] sent at 10/23/2016  3:14 PM EST ----- [name] - Please call patient. Her labs show that she may be a little bit on the drier side. This could also be because of her heart racing. Would cut furosemide/Lasix in half for now. If she begins to have worsening symptoms or her weight begins to trend upward, she should call the office even over the weekend if needed. Magnesium level is borderline low. Please ask her to increase dietary intake of healthy sources of magnesium including leafy greens, nuts, seeds, fish, beans, whole grains, avocados, yogurt, and bananas. Thanks.  [Will also fwrd to Cecilie Kicks who is seeing her in close f/u on Monday - would consider repeat BMET/Mg level when she returns on Monday to further trend. She required mag repletion in the hospital since she had QT prolongation, but I do not want to add just yet in case her Cr continues to rise.] [employer] PA-C

## 2016-10-27 NOTE — Telephone Encounter (Signed)
Called patient with test results. No answer. Unable to leave VM.

## 2016-10-27 NOTE — Patient Instructions (Addendum)
Medication Instructions: STOP Bidil  Decrease Lasix to 20 mg daily  HOLD Atorvastatin for 2 days. Then take 40 mg daily. We have sent in a new prescription.  START Eliquis 5 mg twice daily.---DO NOT MISS ANY DOSES.   Labwork:  Your physician recommends that you return for lab work: CBC, BMET, PT-INR, PTT   Testing/Procedures: Your physician has recommended that you have a Cardioversion (DCCV). Electrical Cardioversion uses a jolt of electricity to your heart either through paddles or wired patches attached to your chest. This is a controlled, usually prescheduled, procedure. Defibrillation is done under light anesthesia in the hospital, and you usually go home the day of the procedure. This is done to get your heart back into a normal rhythm. You are not awake for the procedure. Please see the instruction sheet given to you today.   Follow-Up:  You have been referred to Dr. Curt Bears in 1 week.  Your physician recommends that you schedule a follow-up appointment with Cecilie Kicks, NP or someone in the [place] office if not able to see Dr. Curt Bears in 1 week.   Any Other Special Instructions will be listed below:   Electrical Cardioversion Electrical cardioversion is the delivery of a jolt of electricity to restore a normal rhythm to the heart. A rhythm that is too fast or is not regular keeps the heart from pumping well. In this procedure, sticky patches or metal paddles are placed on the chest to deliver electricity to the heart from a device. This procedure may be done in an emergency if:  There is low or no blood pressure as a result of the heart rhythm.  Normal rhythm must be restored as fast as possible to protect the brain and heart from further damage.  It may save a life. This procedure may also be done for irregular or fast heart rhythms that are not immediately life-threatening. Tell a health care provider about:  Any allergies you have.  All medicines you are  taking, including vitamins, herbs, eye drops, creams, and over-the-counter medicines.  Any problems you or family members have had with anesthetic medicines.  Any blood disorders you have.  Any surgeries you have had.  Any medical conditions you have.  Whether you are pregnant or may be pregnant. What are the risks? Generally, this is a safe procedure. However, problems may occur, including:  Allergic reactions to medicines.  A blood clot that breaks free and travels to other parts of your body.  The possible return of an abnormal heart rhythm within hours or days after the procedure.  Your heart stopping (cardiac arrest). This is rare. What happens before the procedure? Medicines  Your health care provider may have you start taking:  Blood-thinning medicines (anticoagulants) so your blood does not clot as easily.  Medicines may be given to help stabilize your heart rate and rhythm.  Ask your health care provider about changing or stopping your regular medicines. This is especially important if you are taking diabetes medicines or blood thinners. General instructions  Plan to have someone take you home from the hospital or clinic.  If you will be going home right after the procedure, plan to have someone with you for 24 hours.  Follow instructions from your health care provider about eating or drinking restrictions. What happens during the procedure?  To lower your risk of infection:  Your health care team will wash or sanitize their hands.  Your skin will be washed with soap.  An IV tube  will be inserted into one of your veins.  You will be given a medicine to help you relax (sedative).  Sticky patches (electrodes) or metal paddles may be placed on your chest.  An electrical shock will be delivered. The procedure may vary among health care providers and hospitals. What happens after the procedure?  Your blood pressure, heart rate, breathing rate, and blood  oxygen level will be monitored until the medicines you were given have worn off.  Do not drive for 24 hours if you were given a sedative.  Your heart rhythm will be watched to make sure it does not change. This information is not intended to replace advice given to you by your health care provider. Make sure you discuss any questions you have with your health care provider. Document Released: 10/31/2002 Document Revised: 07/09/2016 Document Reviewed: 05/16/2016 Elsevier Interactive Patient Education  2017 [employer].      If you need a refill on your cardiac medications before your next appointment, please call your pharmacy.

## 2016-10-28 LAB — CBC WITH DIFFERENTIAL/PLATELET
BASOS ABS: 111 {cells}/uL (ref 0–200)
BASOS PCT: 1 %
EOS ABS: 333 {cells}/uL (ref 15–500)
Eosinophils Relative: 3 %
HCT: 34.9 % — ABNORMAL LOW (ref 35.0–45.0)
HEMOGLOBIN: 11.7 g/dL (ref 11.7–15.5)
LYMPHS ABS: 1332 {cells}/uL (ref 850–3900)
Lymphocytes Relative: 12 %
MCH: 29.6 pg (ref 27.0–33.0)
MCHC: 33.5 g/dL (ref 32.0–36.0)
MCV: 88.4 fL (ref 80.0–100.0)
MONOS PCT: 6 %
MPV: 11.2 fL (ref 7.5–12.5)
Monocytes Absolute: 666 cells/uL (ref 200–950)
NEUTROS ABS: 8658 {cells}/uL — AB (ref 1500–7800)
Neutrophils Relative %: 78 %
PLATELETS: 293 10*3/uL (ref 140–400)
RBC: 3.95 MIL/uL (ref 3.80–5.10)
RDW: 14.5 % (ref 11.0–15.0)
WBC: 11.1 10*3/uL — ABNORMAL HIGH (ref 3.8–10.8)

## 2016-10-28 LAB — APTT: APTT: 33 s (ref 22–34)

## 2016-10-28 LAB — PROTIME-INR
INR: 1
PROTHROMBIN TIME: 10.3 s (ref 9.0–11.5)

## 2016-10-30 ENCOUNTER — Ambulatory Visit (HOSPITAL_COMMUNITY)
Admission: RE | Admit: 2016-10-30 | Discharge: 2016-10-30 | Disposition: A | Discharge status: Home / Self Care | Payer: PPO | Source: Ambulatory Visit | Attending: Cardiology | Admitting: Cardiology

## 2016-10-30 ENCOUNTER — Encounter (HOSPITAL_COMMUNITY): Payer: Self-pay | Admitting: Certified Registered"

## 2016-10-30 ENCOUNTER — Encounter (HOSPITAL_COMMUNITY): Payer: Self-pay | Admitting: *Deleted

## 2016-10-30 ENCOUNTER — Encounter (HOSPITAL_COMMUNITY)
Admission: RE | Disposition: A | Discharge status: Home / Self Care | Payer: Self-pay | Source: Ambulatory Visit | Attending: Cardiology

## 2016-10-30 ENCOUNTER — Other Ambulatory Visit (HOSPITAL_COMMUNITY): Payer: PPO

## 2016-10-30 DIAGNOSIS — I4892 Unspecified atrial flutter: Secondary | ICD-10-CM | POA: Insufficient documentation

## 2016-10-30 DIAGNOSIS — Z91138 Patient's unintentional underdosing of medication regimen for other reason: Secondary | ICD-10-CM | POA: Diagnosis not present

## 2016-10-30 DIAGNOSIS — T45516A Underdosing of anticoagulants, initial encounter: Secondary | ICD-10-CM | POA: Insufficient documentation

## 2016-10-30 DIAGNOSIS — Z5309 Procedure and treatment not carried out because of other contraindication: Secondary | ICD-10-CM | POA: Diagnosis not present

## 2016-10-30 SURGERY — CANCELLED PROCEDURE

## 2016-10-30 MED ORDER — SODIUM CHLORIDE 0.9 % IV SOLN
INTRAVENOUS | Status: DC
Start: 2016-10-30 — End: 2016-10-30

## 2016-10-30 NOTE — Progress Notes (Signed)
Patient arrived to endoscopy for scheduled outpatient TEE/cardioversion today. Patient was started on eliquis twice daily on 10/27/16. Per patient she has only been taking once daily. She took 1 dose 10/28/16, 1 dose 10/29/16, and 1 dose this morning 10/30/16. Call placed to Dr. Aundra Dubin, order received to cancel procedure today due to inadequate anticoagulation. Per Cecilie Kicks, NP, patient to reschedule for TEE/cardioversion on 11/03/16 or 11/04/16. Patient informed of this, advised she needs to take eliquis twice daily everyday between now and scheduled procedure and take AM dose on the day of procedure. She expresses understanding and agrees. Rescheduled for 11/03/16, patient advised to follow same pre procedure instructions.

## 2016-10-31 ENCOUNTER — Encounter (HOSPITAL_COMMUNITY): Payer: Self-pay | Admitting: Cardiology

## 2016-10-31 ENCOUNTER — Encounter (HOSPITAL_COMMUNITY): Payer: Self-pay | Admitting: Certified Registered Nurse Anesthetist

## 2016-10-31 ENCOUNTER — Telehealth: Payer: Self-pay | Admitting: *Deleted

## 2016-10-31 NOTE — Telephone Encounter (Signed)
Called pt to let her know, per Cecilie Kicks, NP, that her TEE/DCCV has been cancelled due to her being back in [place].  Left a message for her to call back

## 2016-11-03 ENCOUNTER — Ambulatory Visit (HOSPITAL_COMMUNITY): Admission: RE | Admit: 2016-11-03 | Payer: PPO | Source: Ambulatory Visit | Admitting: Cardiology

## 2016-11-03 ENCOUNTER — Ambulatory Visit (HOSPITAL_COMMUNITY)
Admission: RE | Admit: 2016-11-03 | Discharge: 2016-11-03 | Disposition: A | Discharge status: Home / Self Care | Payer: PPO | Source: Ambulatory Visit | Attending: Cardiology | Admitting: Cardiology

## 2016-11-03 ENCOUNTER — Encounter (HOSPITAL_COMMUNITY): Admission: RE | Payer: Self-pay | Source: Ambulatory Visit

## 2016-11-03 ENCOUNTER — Encounter (HOSPITAL_COMMUNITY): Payer: Self-pay | Admitting: Certified Registered Nurse Anesthetist

## 2016-11-03 ENCOUNTER — Ambulatory Visit (HOSPITAL_COMMUNITY): Payer: PPO | Admitting: Certified Registered Nurse Anesthetist

## 2016-11-03 ENCOUNTER — Encounter (HOSPITAL_COMMUNITY)
Admission: RE | Disposition: A | Discharge status: Home / Self Care | Payer: Self-pay | Source: Ambulatory Visit | Attending: Cardiology

## 2016-11-03 DIAGNOSIS — Z853 Personal history of malignant neoplasm of breast: Secondary | ICD-10-CM | POA: Insufficient documentation

## 2016-11-03 DIAGNOSIS — E785 Hyperlipidemia, unspecified: Secondary | ICD-10-CM | POA: Insufficient documentation

## 2016-11-03 DIAGNOSIS — F419 Anxiety disorder, unspecified: Secondary | ICD-10-CM | POA: Insufficient documentation

## 2016-11-03 DIAGNOSIS — I2510 Atherosclerotic heart disease of native coronary artery without angina pectoris: Secondary | ICD-10-CM | POA: Diagnosis not present

## 2016-11-03 DIAGNOSIS — Z5309 Procedure and treatment not carried out because of other contraindication: Secondary | ICD-10-CM | POA: Diagnosis not present

## 2016-11-03 DIAGNOSIS — Z7902 Long term (current) use of antithrombotics/antiplatelets: Secondary | ICD-10-CM | POA: Insufficient documentation

## 2016-11-03 DIAGNOSIS — E669 Obesity, unspecified: Secondary | ICD-10-CM | POA: Insufficient documentation

## 2016-11-03 DIAGNOSIS — Z79899 Other long term (current) drug therapy: Secondary | ICD-10-CM | POA: Insufficient documentation

## 2016-11-03 DIAGNOSIS — Z955 Presence of coronary angioplasty implant and graft: Secondary | ICD-10-CM | POA: Insufficient documentation

## 2016-11-03 DIAGNOSIS — I130 Hypertensive heart and chronic kidney disease with heart failure and stage 1 through stage 4 chronic kidney disease, or unspecified chronic kidney disease: Secondary | ICD-10-CM | POA: Diagnosis not present

## 2016-11-03 DIAGNOSIS — Z87891 Personal history of nicotine dependence: Secondary | ICD-10-CM | POA: Insufficient documentation

## 2016-11-03 DIAGNOSIS — I5042 Chronic combined systolic (congestive) and diastolic (congestive) heart failure: Secondary | ICD-10-CM | POA: Diagnosis not present

## 2016-11-03 DIAGNOSIS — N183 Chronic kidney disease, stage 3 (moderate): Secondary | ICD-10-CM | POA: Insufficient documentation

## 2016-11-03 DIAGNOSIS — Z9119 Patient's noncompliance with other medical treatment and regimen: Secondary | ICD-10-CM | POA: Diagnosis not present

## 2016-11-03 DIAGNOSIS — Z7982 Long term (current) use of aspirin: Secondary | ICD-10-CM | POA: Diagnosis not present

## 2016-11-03 DIAGNOSIS — Z6831 Body mass index (BMI) 31.0-31.9, adult: Secondary | ICD-10-CM | POA: Insufficient documentation

## 2016-11-03 DIAGNOSIS — E1122 Type 2 diabetes mellitus with diabetic chronic kidney disease: Secondary | ICD-10-CM | POA: Insufficient documentation

## 2016-11-03 DIAGNOSIS — I471 Supraventricular tachycardia: Secondary | ICD-10-CM | POA: Diagnosis not present

## 2016-11-03 SURGERY — ECHOCARDIOGRAM, TRANSESOPHAGEAL
Anesthesia: Monitor Anesthesia Care

## 2016-11-03 SURGERY — CANCELLED PROCEDURE

## 2016-11-03 NOTE — Progress Notes (Signed)
Patient admitted to Endo unit and placed on monitor. Patient appeared to be in NSR. EKG obtained and MD confirmed NSR with occasional PVC's. Patient informed to go home and continue to take medications as prescribed and to call MD office for a follow up appointment tomorrow. Patient shared understanding.

## 2016-11-07 ENCOUNTER — Institutional Professional Consult (permissible substitution): Payer: PPO | Admitting: Cardiology

## 2016-11-07 NOTE — Progress Notes (Deleted)
Electrophysiology Office Note   Date:  11/07/2016   ID:  Kristen Garner, DOB 12-06-1950, MRN [id number]  PCP:  Purvis Kilts, MD  Cardiologist:  Bronson Ing Primary Electrophysiologist:  Constance Haw, MD    No chief complaint on file.    History of Present Illness: Kristen Garner is a 65 y.o. female who presents today for electrophysiology evaluation.   She has a hx history of HTN, HLD, Type 2 DM, ?Stage 3 CKD, remote history of breast cancer, and recently diagnosed combined CHF and CAD who presents for post-hospital follow-up. She was admitted 11/17-11/23/17 with new acute CHF and minimally elevated troponin. 2D echo 10/11/16 showed EF 20-25%, mild LVH, diffuse HK, akinesis of inferolateral myocardium, grade 1 DD, mild MR, mod LAE. She unerwent LHC 10/13/16 which showed 3-vessel CAD with severe diffuse stenosis in the proximal, mid and distal RCA with mild to moderate calcification, chronic occlusion of the posterolateral branch, diffuse mild stenosis LAD, moderate stenosis Diagonal branch, severe diffuse stenosis in the small caliber mid AV groove Circumflex and small caliber obtuse marginal branch. Given her CKD she was brought back for staged DESx2 to RCA on 10/14/16 with plan for medical management of Cx and diagonal disease.   She was seen in [place] [facility] 10/23/16 and her SOB was better.  No orthopnea, syncope, or chest pain. EKG revealed a narrow regular tachycardia. We obtained a rhythm strip - I reviewed with Dr. Bronson Ing given concern for possible atrial flutter; he feels this is more representative of bursts of PAT (that were also noted even on day of DC in the hospital recently).   Today, she denies*** symptoms of palpitations, chest pain, shortness of breath, orthopnea, PND, lower extremity edema, claudication, dizziness, presyncope, syncope, bleeding, or neurologic sequela. The patient is tolerating medications without difficulties and is otherwise  without complaint today.    Past Medical History:  Diagnosis Date  . Abnormal chest CT    a. CTA 09/2016: ""1.4 cm aortopulmonary window node seen, of uncertain significance"  . Anxiety   . Breast cancer ([place])    right  . CAD (coronary artery disease)    a. 09/2016: cath showing 3-v disease with severe stenosis along RCA, CTO of PL branch, mild LAD stenosis, and moderate D1 stenosis. S/p DES x2 to RCA on 10/14/2016.  Marland Kitchen Chronic systolic CHF (congestive heart failure) ([place])    a. 09/2016: Echo showing EF of 20-25% with diffuse HK  . CKD (chronic kidney disease), stage III   . Depression   . Diabetes mellitus    takes only the pill  . Headache(784.0)    occiasional migraine  . Hyperlipidemia   . Hypertension   . Hypokalemia   . Noncompliance 01/04/2015  . Osteopenia 01/04/2015  . PAT (paroxysmal atrial tachycardia) ([place])   . PVC's (premature ventricular contractions)   . Skin cancer 2015   right leg   Past Surgical History:  Procedure Laterality Date  . ABDOMINAL HYSTERECTOMY    . BREAST BIOPSY Right    biopsy then wide excision   . CARDIAC CATHETERIZATION N/A 10/13/2016   Procedure: Left Heart Cath and Coronary Angiography;  Surgeon: Burnell Blanks, MD;  Location: [facility];  Service: Cardiovascular;  Laterality: N/A;  . CARDIAC CATHETERIZATION N/A 10/14/2016   Procedure: Coronary Stent Intervention;  Surgeon: Burnell Blanks, MD;  Location: [facility];  Service: Cardiovascular;  Laterality: N/A;  . CHOLECYSTECTOMY    . CORONARY ANGIOPLASTY WITH STENT PLACEMENT  10/14/2016  . SENTINEL LYMPH NODE BIOPSY    . SKIN CANCER EXCISION Right 2015     Current Outpatient Prescriptions  Medication Sig Dispense Refill  . Alogliptin Benzoate (NESINA) 6.25 MG TABS Take 1 tablet by mouth daily.    Marland Kitchen ALPRAZolam (XANAX) 1 MG tablet Take 1 mg by mouth 2 (two) times daily.     Marland Kitchen apixaban (ELIQUIS) 5 MG TABS tablet Take 1 tablet (5 mg total) by mouth 2 (two)  times daily. 60 tablet 6  . aspirin EC 81 MG EC tablet Take 1 tablet (81 mg total) by mouth daily.    Marland Kitchen atorvastatin (LIPITOR) 40 MG tablet Take 1 tablet (40 mg total) by mouth daily. 90 tablet 3  . clopidogrel (PLAVIX) 75 MG tablet Take 1 tablet (75 mg total) by mouth daily. 30 tablet 6  . furosemide (LASIX) 40 MG tablet Take 0.5 tablets (20 mg total) by mouth daily. 30 tablet 6  . HYDROcodone-acetaminophen (NORCO/VICODIN) 5-325 MG tablet Take 1 tablet by mouth every 6 (six) hours as needed for moderate pain or severe pain.     . metoprolol succinate (TOPROL-XL) 50 MG 24 hr tablet Take 1 tablet (50 mg total) by mouth 2 (two) times daily. Take with or immediately following a meal. 60 tablet 3  . nitroGLYCERIN (NITROSTAT) 0.4 MG SL tablet Place 0.4 mg under the tongue every 5 (five) minutes as needed for chest pain.    . pantoprazole (PROTONIX) 40 MG tablet Take 1 tablet (40 mg total) by mouth daily. 60 tablet 6  . potassium chloride 20 MEQ TBCR Take 20 mEq by mouth daily. 30 tablet 6  . zolpidem (AMBIEN) 10 MG tablet Take 10 mg by mouth at bedtime.     No current facility-administered medications for this visit.     Allergies:   Codeine; Penicillins; Sulfonamide derivatives; and Iodine   Social History:  The patient  reports that she has quit smoking. She has never used smokeless tobacco. She reports that she does not drink alcohol or use drugs.   Family History:  The patient's family history includes Diabetes in her father; Stroke in her maternal grandmother.    ROS:  Please see the history of present illness.   Otherwise, review of systems is positive for ***.   All other systems are reviewed and negative.    PHYSICAL EXAM: VS:  There were no vitals taken for this visit. , BMI There is no height or weight on file to calculate BMI. GEN: Well nourished, well developed, in no acute distress  HEENT: normal  Neck: no JVD, carotid bruits, or masses Cardiac: ***RRR; no murmurs, rubs, or  gallops,no edema  Respiratory:  clear to auscultation bilaterally, normal work of breathing GI: soft, nontender, nondistended, + BS MS: no deformity or atrophy  Skin: warm and dry Neuro:  Strength and sensation are intact Psych: euthymic mood, full affect  EKG:  EKG {ACTION; IS/IS OZD:[id number]} ordered today. Personal review of the ekg ordered shows ***  Recent Labs: 10/10/2016: B Natriuretic Peptide 496.0; TSH 2.285 10/23/2016: ALT 14; Magnesium 1.7 10/27/2016: BUN 19; Creat 1.52; Hemoglobin 11.7; Platelets 293; Potassium 3.6; Sodium 138    Lipid Panel  No results found for: CHOL, TRIG, HDL, CHOLHDL, VLDL, LDLCALC, LDLDIRECT   Wt Readings from Last 3 Encounters:  10/27/16 170 lb 12.8 oz (77.5 kg)  10/23/16 173 lb (78.5 kg)  10/16/16 171 lb 15.3 oz (78 kg)      Other studies Reviewed: Additional studies/ records  that were reviewed today include: TTE 10/11/16, Cath 10/14/16  Review of the above records today demonstrates:  - Left ventricle: The cavity size was mildly dilated. Wall   thickness was increased in a pattern of mild LVH. Systolic   function was severely reduced. The estimated ejection fraction   was in the range of 20% to 25%. Diffuse hypokinesis. There is   akinesis of the inferolateral myocardium. Doppler parameters are   consistent with abnormal left ventricular relaxation (grade 1   diastolic dysfunction). - Mitral valve: There was mild regurgitation. - Left atrium: The atrium was moderately dilated.    Mid RCA-2 lesion, 70 %stenosed.  Dist RCA lesion, 100 %stenosed.  Ost RPDA to RPDA lesion, 30 %stenosed.  A STENT SYNERGY DES 2.5X28 drug eluting stent was successfully placed.  Mid RCA-1 lesion, 99 %stenosed.  Post intervention, there is a 0% residual stenosis.  A STENT SYNERGY DES 2.5X32 drug eluting stent was successfully placed, and overlaps previously placed stent.  Ost RCA to Prox RCA lesion, 60 %stenosed.  Post intervention, there is a 0%  residual stenosis.   1. Severe stenosis proximal/mid RCA 2. Successful PTCA/DES x 2 proximal and mid RCA      ASSESSMENT AND PLAN:  1.  Chronic combined heart failure:  2. Paroxysmal SVT:   3. CAD:  4. Hypertension:    Current medicines are reviewed at length with the patient today.   The patient {ACTIONS; HAS/DOES NOT HAVE:19233} concerns regarding her medicines.  The following changes were made today:  {NONE DEFAULTED:18576::"none"}  Labs/ tests ordered today include: *** No orders of the defined types were placed in this encounter.    Disposition:   FU with *** {gen number [id number]} {Days to years:10300}  Signed, Brianna Bennett Meredith Leeds, MD  11/07/2016 1:32 PM     [address] [place] [address] [phone] (office) [phone] (fax)

## 2016-11-12 ENCOUNTER — Encounter: Payer: Self-pay | Admitting: Internal Medicine

## 2016-11-12 ENCOUNTER — Encounter (INDEPENDENT_AMBULATORY_CARE_PROVIDER_SITE_OTHER): Payer: Self-pay

## 2016-11-12 ENCOUNTER — Ambulatory Visit (INDEPENDENT_AMBULATORY_CARE_PROVIDER_SITE_OTHER): Payer: PPO | Admitting: Internal Medicine

## 2016-11-12 VITALS — BP 140/62 | HR 62 | Ht 63.0 in | Wt 171.0 lb

## 2016-11-12 DIAGNOSIS — I5043 Acute on chronic combined systolic (congestive) and diastolic (congestive) heart failure: Secondary | ICD-10-CM

## 2016-11-12 DIAGNOSIS — I4892 Unspecified atrial flutter: Secondary | ICD-10-CM

## 2016-11-12 DIAGNOSIS — I5042 Chronic combined systolic (congestive) and diastolic (congestive) heart failure: Secondary | ICD-10-CM | POA: Diagnosis not present

## 2016-11-12 DIAGNOSIS — I10 Essential (primary) hypertension: Secondary | ICD-10-CM | POA: Diagnosis not present

## 2016-11-12 DIAGNOSIS — I471 Supraventricular tachycardia: Secondary | ICD-10-CM | POA: Diagnosis not present

## 2016-11-12 LAB — BASIC METABOLIC PANEL
BUN: 14 mg/dL (ref 7–25)
CALCIUM: 9.4 mg/dL (ref 8.6–10.4)
CO2: 29 mmol/L (ref 20–31)
CREATININE: 1.21 mg/dL — AB (ref 0.50–0.99)
Chloride: 98 mmol/L (ref 98–110)
Glucose, Bld: 137 mg/dL — ABNORMAL HIGH (ref 65–99)
Potassium: 3.6 mmol/L (ref 3.5–5.3)
Sodium: 139 mmol/L (ref 135–146)

## 2016-11-12 MED ORDER — CLOPIDOGREL BISULFATE 75 MG PO TABS: 75.0000 mg | ORAL_TABLET | Freq: Every day | ORAL | 11 refills | Status: DC

## 2016-11-12 MED ORDER — PANTOPRAZOLE SODIUM 40 MG PO TBEC: 40.0000 mg | DELAYED_RELEASE_TABLET | Freq: Every day | ORAL | 11 refills | Status: DC

## 2016-11-12 MED ORDER — FUROSEMIDE 40 MG PO TABS: 20.0000 mg | ORAL_TABLET | Freq: Every day | ORAL | 11 refills | Status: DC

## 2016-11-12 MED ORDER — METOPROLOL SUCCINATE ER 50 MG PO TB24
50.0000 mg | ORAL_TABLET | Freq: Two times a day (BID) | ORAL | 11 refills | Status: DC
Start: 2016-11-12 — End: 2017-10-01

## 2016-11-12 MED ORDER — APIXABAN 5 MG PO TABS: 5.0000 mg | ORAL_TABLET | Freq: Two times a day (BID) | ORAL | 11 refills | Status: DC

## 2016-11-12 MED ORDER — POTASSIUM CHLORIDE ER 20 MEQ PO TBCR: 20.0000 meq | EXTENDED_RELEASE_TABLET | Freq: Every day | ORAL | 11 refills | Status: DC

## 2016-11-12 NOTE — Patient Instructions (Addendum)
Medication Instructions:  Your physician has recommended you make the following change in your medication:  1) STOP Eliquis     Labwork: TODAY- BMET   Testing/Procedures: Your physician has recommended that you wear a 24 hour holter monitor - in [place]. Holter monitors are medical devices that record the heart's electrical activity. Doctors most often use these monitors to diagnose arrhythmias. Arrhythmias are problems with the speed or rhythm of the heartbeat. The monitor is a small, portable device. You can wear one while you do your normal daily activities. This is usually used to diagnose what is causing palpitations/syncope (passing out).  Your physician has requested that you have an echocardiogram. Echocardiography is a painless test that uses sound waves to create images of your heart. It provides your doctor with information about the size and shape of your heart and how well your heart's chambers and valves are working. This procedure takes approximately one hour. There are no restrictions for this procedure.---in 3 months (with Dr. Bronson Ing)      Follow-Up: Your physician recommends that you schedule a follow-up appointment in: 1 month with Dr. Bronson Ing  Your physician recommends that you schedule a follow-up appointment in: 3 months with Dr. Bronson Ing  Your physician recommends that you schedule a follow-up appointment with Dr. Caryl Comes as needed     Any Other Special Instructions Will Be Listed Below (If Applicable).     If you need a refill on your cardiac medications before your next appointment, please call your pharmacy.

## 2016-11-12 NOTE — Progress Notes (Signed)
ELECTROPHYSIOLOGY CONSULT NOTE  Patient ID: Kristen Garner, MRN: [id number], DOB/AGE: February 27, 1951 65 y.o. Admit date: (Not on file) Date of Consult: 11/12/2016  Primary Physician: Purvis Kilts, MD Primary Cardiologist: [facility]  Chief Complaint: Atrial flutter   HPI Kristen Garner is a 65 y.o. female  Referred for rhtyhm followup  She was seen late November for hospital follow up with complaints of tachypalpitations. Strips were reviewed with Drs. Cooper S KO and WC.  Flutter was the presumptive diagnosis and anticoagulation was initiated.   This had followed a recent hospitalization for congestive heart failure with remote history of breast cancer and no diabetes and chronic kidney disease. Ejection fraction was 25% prompting  Catheterization 11/17 She was started on metoprolol but not on ACE inhibitor because of renal insufficiency.   Since being at home she is able to walk a couple of 100 feet. She denies nocturnal dyspnea orthopnea or peripheral edema  She was seen 2 weeks ago by [facility] and because of blood pressures in the 90 her diuretics were discontinued and her BiDil was stopped.  Past Medical History:  Diagnosis Date  . Abnormal chest CT    a. CTA 09/2016: ""1.4 cm aortopulmonary window node seen, of uncertain significance"  . Anxiety   . Breast cancer ([place])    right  . CAD (coronary artery disease)    a. 09/2016: cath showing 3-v disease with severe stenosis along RCA, CTO of PL branch, mild LAD stenosis, and moderate D1 stenosis. S/p DES x2 to RCA on 10/14/2016.  Marland Kitchen Chronic systolic CHF (congestive heart failure) ([place])    a. 09/2016: Echo showing EF of 20-25% with diffuse HK  . CKD (chronic kidney disease), stage III   . Depression   . Diabetes mellitus    takes only the pill  . Headache(784.0)    occiasional migraine  . Hyperlipidemia   . Hypertension   . Hypokalemia   . Noncompliance 01/04/2015  . Osteopenia 01/04/2015  . PAT  (paroxysmal atrial tachycardia) ([place])   . PVC's (premature ventricular contractions)   . Skin cancer 2015   right leg      Surgical History:  Past Surgical History:  Procedure Laterality Date  . ABDOMINAL HYSTERECTOMY    . BREAST BIOPSY Right    biopsy then wide excision   . CARDIAC CATHETERIZATION N/A 10/13/2016   Procedure: Left Heart Cath and Coronary Angiography;  Surgeon: Burnell Blanks, MD;  Location: [facility];  Service: Cardiovascular;  Laterality: N/A;  . CARDIAC CATHETERIZATION N/A 10/14/2016   Procedure: Coronary Stent Intervention;  Surgeon: Burnell Blanks, MD;  Location: [facility];  Service: Cardiovascular;  Laterality: N/A;  . CHOLECYSTECTOMY    . CORONARY ANGIOPLASTY WITH STENT PLACEMENT  10/14/2016  . SENTINEL LYMPH NODE BIOPSY    . SKIN CANCER EXCISION Right 2015     Home Meds: Prior to Admission medications   Medication Sig Start Date End Date Taking? Authorizing Provider  Alogliptin Benzoate (NESINA) 6.25 MG TABS Take 1 tablet by mouth daily.    Historical Provider, MD  ALPRAZolam Duanne Moron) 1 MG tablet Take 1 mg by mouth 2 (two) times daily.  09/16/16   Historical Provider, MD  apixaban (ELIQUIS) 5 MG TABS tablet Take 1 tablet (5 mg total) by mouth 2 (two) times daily. 10/27/16   Isaiah Serge, NP  aspirin EC 81 MG EC tablet Take 1 tablet (81 mg total) by mouth daily.  10/16/16   Erma Heritage, PA  atorvastatin (LIPITOR) 40 MG tablet Take 1 tablet (40 mg total) by mouth daily. 10/27/16 01/25/17  Isaiah Serge, NP  clopidogrel (PLAVIX) 75 MG tablet Take 1 tablet (75 mg total) by mouth daily. 10/16/16   Erma Heritage, PA  furosemide (LASIX) 40 MG tablet Take 0.5 tablets (20 mg total) by mouth daily. 10/27/16   Isaiah Serge, NP  HYDROcodone-acetaminophen (NORCO/VICODIN) 5-325 MG tablet Take 1 tablet by mouth every 6 (six) hours as needed for moderate pain or severe pain.  09/25/16   Historical Provider, MD  metoprolol succinate  (TOPROL-XL) 50 MG 24 hr tablet Take 1 tablet (50 mg total) by mouth 2 (two) times daily. Take with or immediately following a meal. 10/23/16 01/21/17  Dayna N Dunn, PA-C  nitroGLYCERIN (NITROSTAT) 0.4 MG SL tablet Place 0.4 mg under the tongue every 5 (five) minutes as needed for chest pain.    Historical Provider, MD  pantoprazole (PROTONIX) 40 MG tablet Take 1 tablet (40 mg total) by mouth daily. 10/23/16   Dayna N Dunn, PA-C  potassium chloride 20 MEQ TBCR Take 20 mEq by mouth daily. 10/16/16   Erma Heritage, PA  zolpidem (AMBIEN) 10 MG tablet Take 10 mg by mouth at bedtime.    Historical Provider, MD    Allergies:  Allergies  Allergen Reactions  . Codeine Itching  . Penicillins Other (See Comments)    Child hood allergy  . Sulfonamide Derivatives Itching  . Iodine     Patient states that she got a cat scratch and her mom put iodine on it and then got catch scratch fever    Social History   Social History  . Marital status: Widowed    Spouse name: N/A  . Number of children: N/A  . Years of education: N/A   Occupational History  . Not on file.   Social History Main Topics  . Smoking status: Former Research scientist (life sciences)  . Smokeless tobacco: Never Used  . Alcohol use No  . Drug use: No  . Sexual activity: Yes    Birth control/ protection: Surgical   Other Topics Concern  . Not on file   Social History Narrative  . No narrative on file     Family History  Problem Relation Age of Onset  . Diabetes Father   . Stroke Maternal Grandmother      ROS:  Please see the history of present illness.     All other systems reviewed and negative.    Physical Exam:  Blood pressure 140/62, pulse 62, height 5\' 3"  (1.6 m), weight 171 lb (77.6 kg), SpO2 94 %. General: Well developed, well nourished female in no acute distress. Head: Normocephalic, atraumatic, sclera non-icteric, no xanthomas, nares are without discharge. EENT: normal  Lymph Nodes:  none Neck: Negative for carotid bruits.  JVD 7-8 Back:without scoliosis kyphosis  Lungs: Clear bilaterally to auscultation without wheezes, rales, or rhonchi. Breathing is unlabored. Heart: RRR with S1 S2. N 2/6 systolic  murmur . No rubs, or gallops appreciated. Abdomen: Soft, non-tender, non-distended with normoactive bowel sounds. No hepatomegaly. No rebound/guarding. No obvious abdominal masses. Msk:  Strength and tone appear normal for age. Extremities: No clubbing or cyanosis. No  edema.  Distal pedal pulses are 2+ and equal bilaterally. Skin: Warm and Dry Neuro: Alert and oriented X 3. CN III-XII intact Grossly normal sensory and motor function . Psych:  Responds to questions appropriately with a normal affect.  Labs: Cardiac Enzymes No results for input(s): CKTOTAL, CKMB, TROPONINI in the last 72 hours. CBC Lab Results  Component Value Date   WBC 11.1 (H) 10/27/2016   HGB 11.7 10/27/2016   HCT 34.9 (L) 10/27/2016   MCV 88.4 10/27/2016   PLT 293 10/27/2016   PROTIME: No results for input(s): LABPROT, INR in the last 72 hours. Chemistry No results for input(s): NA, K, CL, CO2, BUN, CREATININE, CALCIUM, PROT, BILITOT, ALKPHOS, ALT, AST, GLUCOSE in the last 168 hours.  Invalid input(s): LABALBU Lipids No results found for: CHOL, HDL, LDLCALC, TRIG BNP No results found for: PROBNP Thyroid Function Tests: No results for input(s): TSH, T4TOTAL, T3FREE, THYROIDAB in the last 72 hours.  Invalid input(s): FREET3 Miscellaneous Lab Results  Component Value Date   DDIMER 0.46 10/10/2016    Radiology/Studies:  No results found.  EKG: Sinus rhythm  Rate 72 Intervals 20/11/42 Left ventricular hypertrophy with ST repolarization changes PVCs left bundle mostly vertical axis  Multiple tracings were reviewed. All old ECGs were reviewed personally as well as a telemetry strips from her hospitalization. The following findings 1-atrial tachycardia without evidence of atrial flutter 2-nonsustained ventricular  tachycardia 3-PVCs  Assessment and Plan:  Atrial tachycardia  Ischemic cardiomyopathy-recent PCI  Congestive heart failure-chronic-systolic  Ventricular tachycardia-nonsustained  PVCs left bundle superior axis    We need to quantitate the PVC burden to see if it may be contributing to her cardiomyopathy. We will undertake a 24-hour Holter monitor.  Reviewing of the ECGs and telemetry strips from the hospital are most consistent with atrial tachycardia and not atrial flutter. We will discontinue her ELIQUIS.  Her renal function is not normal. We will reassess it today. If her creatinine is stable, we will initiate her a few with an ACE inhibitor.  Reassessment of LV function in the three-month timeframe following revascularization would inform decision-making regarding ICD.  We will continue her on aspirin for 3 months since we are not going to have her on anticoagulation   We will arrange follow-up for her in [place] with Dr. Criselda Peaches saw her in the hospital.  We will ask her to return in one month for assessment of reintroduced medications which we will take care of by phone and then in 3 months for re-assessment of LV function and decisions regarding referral for ICD for primary prevention and discussions regarding dual antiplatelet therapy.  In the event that her PVC burden is a high, we will initiate antiarrhythmic therapy; the Holter monitor will help [place] in this regard also to know what degree of bradycardia she has.       Virl Axe

## 2016-11-13 ENCOUNTER — Ambulatory Visit (HOSPITAL_COMMUNITY)
Admission: RE | Admit: 2016-11-13 | Discharge: 2016-11-13 | Disposition: A | Discharge status: Home / Self Care | Payer: PPO | Source: Ambulatory Visit | Attending: Internal Medicine | Admitting: Internal Medicine

## 2016-11-13 DIAGNOSIS — I471 Supraventricular tachycardia: Secondary | ICD-10-CM | POA: Diagnosis not present

## 2016-11-13 DIAGNOSIS — E119 Type 2 diabetes mellitus without complications: Secondary | ICD-10-CM | POA: Insufficient documentation

## 2016-11-13 DIAGNOSIS — I5042 Chronic combined systolic (congestive) and diastolic (congestive) heart failure: Secondary | ICD-10-CM | POA: Diagnosis not present

## 2016-11-13 DIAGNOSIS — I4892 Unspecified atrial flutter: Secondary | ICD-10-CM | POA: Insufficient documentation

## 2016-11-13 DIAGNOSIS — I110 Hypertensive heart disease with heart failure: Secondary | ICD-10-CM | POA: Insufficient documentation

## 2016-11-13 DIAGNOSIS — I358 Other nonrheumatic aortic valve disorders: Secondary | ICD-10-CM | POA: Diagnosis not present

## 2016-11-13 DIAGNOSIS — I493 Ventricular premature depolarization: Secondary | ICD-10-CM | POA: Insufficient documentation

## 2016-11-13 DIAGNOSIS — I5043 Acute on chronic combined systolic (congestive) and diastolic (congestive) heart failure: Secondary | ICD-10-CM | POA: Diagnosis not present

## 2016-11-13 DIAGNOSIS — I10 Essential (primary) hypertension: Secondary | ICD-10-CM

## 2016-11-13 DIAGNOSIS — R29898 Other symptoms and signs involving the musculoskeletal system: Secondary | ICD-10-CM | POA: Insufficient documentation

## 2016-11-13 DIAGNOSIS — I340 Nonrheumatic mitral (valve) insufficiency: Secondary | ICD-10-CM | POA: Diagnosis not present

## 2016-11-13 NOTE — Progress Notes (Signed)
*  PRELIMINARY RESULTS* Echocardiogram 2D Echocardiogram has been performed.  Samuel Germany 11/13/2016, 12:49 PM

## 2016-11-18 DIAGNOSIS — E663 Overweight: Secondary | ICD-10-CM | POA: Diagnosis not present

## 2016-11-18 DIAGNOSIS — Z1389 Encounter for screening for other disorder: Secondary | ICD-10-CM | POA: Diagnosis not present

## 2016-11-18 DIAGNOSIS — F419 Anxiety disorder, unspecified: Secondary | ICD-10-CM | POA: Diagnosis not present

## 2016-11-18 DIAGNOSIS — Z6827 Body mass index (BMI) 27.0-27.9, adult: Secondary | ICD-10-CM | POA: Diagnosis not present

## 2016-11-18 DIAGNOSIS — G894 Chronic pain syndrome: Secondary | ICD-10-CM | POA: Diagnosis not present

## 2016-11-28 ENCOUNTER — Encounter: Payer: Self-pay | Admitting: *Deleted

## 2016-12-09 ENCOUNTER — Telehealth: Payer: Self-pay

## 2016-12-09 NOTE — Telephone Encounter (Signed)
lmtcb for lab results

## 2016-12-12 ENCOUNTER — Encounter: Payer: Self-pay | Admitting: *Deleted

## 2016-12-19 DIAGNOSIS — G2581 Restless legs syndrome: Secondary | ICD-10-CM | POA: Diagnosis not present

## 2016-12-19 DIAGNOSIS — Z6828 Body mass index (BMI) 28.0-28.9, adult: Secondary | ICD-10-CM | POA: Diagnosis not present

## 2016-12-19 DIAGNOSIS — G894 Chronic pain syndrome: Secondary | ICD-10-CM | POA: Diagnosis not present

## 2016-12-19 DIAGNOSIS — E663 Overweight: Secondary | ICD-10-CM | POA: Diagnosis not present

## 2016-12-19 DIAGNOSIS — Z1389 Encounter for screening for other disorder: Secondary | ICD-10-CM | POA: Diagnosis not present

## 2016-12-24 ENCOUNTER — Ambulatory Visit: Payer: PPO | Admitting: Cardiovascular Disease

## 2016-12-24 ENCOUNTER — Encounter: Payer: Self-pay | Admitting: Cardiovascular Disease

## 2017-01-19 DIAGNOSIS — E1165 Type 2 diabetes mellitus with hyperglycemia: Secondary | ICD-10-CM | POA: Diagnosis not present

## 2017-01-19 DIAGNOSIS — I2510 Atherosclerotic heart disease of native coronary artery without angina pectoris: Secondary | ICD-10-CM | POA: Diagnosis not present

## 2017-01-19 DIAGNOSIS — F419 Anxiety disorder, unspecified: Secondary | ICD-10-CM | POA: Diagnosis not present

## 2017-01-19 DIAGNOSIS — Z6828 Body mass index (BMI) 28.0-28.9, adult: Secondary | ICD-10-CM | POA: Diagnosis not present

## 2017-01-19 DIAGNOSIS — I4891 Unspecified atrial fibrillation: Secondary | ICD-10-CM | POA: Diagnosis not present

## 2017-01-19 DIAGNOSIS — E1140 Type 2 diabetes mellitus with diabetic neuropathy, unspecified: Secondary | ICD-10-CM | POA: Diagnosis not present

## 2017-01-19 DIAGNOSIS — Z1389 Encounter for screening for other disorder: Secondary | ICD-10-CM | POA: Diagnosis not present

## 2017-01-19 DIAGNOSIS — G43909 Migraine, unspecified, not intractable, without status migrainosus: Secondary | ICD-10-CM | POA: Diagnosis not present

## 2017-01-19 DIAGNOSIS — E663 Overweight: Secondary | ICD-10-CM | POA: Diagnosis not present

## 2017-01-19 DIAGNOSIS — I509 Heart failure, unspecified: Secondary | ICD-10-CM | POA: Diagnosis not present

## 2017-01-19 DIAGNOSIS — G2581 Restless legs syndrome: Secondary | ICD-10-CM | POA: Diagnosis not present

## 2017-01-19 DIAGNOSIS — E782 Mixed hyperlipidemia: Secondary | ICD-10-CM | POA: Diagnosis not present

## 2017-01-23 ENCOUNTER — Encounter (HOSPITAL_COMMUNITY): Payer: Self-pay | Admitting: Emergency Medicine

## 2017-01-23 ENCOUNTER — Emergency Department (HOSPITAL_COMMUNITY)
Admission: EM | Admit: 2017-01-23 | Discharge: 2017-01-23 | Disposition: A | Discharge status: Home / Self Care | Payer: PPO | Attending: Emergency Medicine | Admitting: Emergency Medicine

## 2017-01-23 ENCOUNTER — Emergency Department (HOSPITAL_COMMUNITY): Payer: PPO

## 2017-01-23 DIAGNOSIS — Z7982 Long term (current) use of aspirin: Secondary | ICD-10-CM | POA: Diagnosis not present

## 2017-01-23 DIAGNOSIS — I5043 Acute on chronic combined systolic (congestive) and diastolic (congestive) heart failure: Secondary | ICD-10-CM | POA: Diagnosis not present

## 2017-01-23 DIAGNOSIS — I2510 Atherosclerotic heart disease of native coronary artery without angina pectoris: Secondary | ICD-10-CM | POA: Diagnosis not present

## 2017-01-23 DIAGNOSIS — Z87891 Personal history of nicotine dependence: Secondary | ICD-10-CM | POA: Insufficient documentation

## 2017-01-23 DIAGNOSIS — Z79899 Other long term (current) drug therapy: Secondary | ICD-10-CM | POA: Insufficient documentation

## 2017-01-23 DIAGNOSIS — Z85828 Personal history of other malignant neoplasm of skin: Secondary | ICD-10-CM | POA: Insufficient documentation

## 2017-01-23 DIAGNOSIS — N183 Chronic kidney disease, stage 3 (moderate): Secondary | ICD-10-CM | POA: Diagnosis not present

## 2017-01-23 DIAGNOSIS — Z853 Personal history of malignant neoplasm of breast: Secondary | ICD-10-CM | POA: Insufficient documentation

## 2017-01-23 DIAGNOSIS — I130 Hypertensive heart and chronic kidney disease with heart failure and stage 1 through stage 4 chronic kidney disease, or unspecified chronic kidney disease: Secondary | ICD-10-CM | POA: Diagnosis not present

## 2017-01-23 DIAGNOSIS — K529 Noninfective gastroenteritis and colitis, unspecified: Secondary | ICD-10-CM | POA: Diagnosis not present

## 2017-01-23 DIAGNOSIS — R197 Diarrhea, unspecified: Secondary | ICD-10-CM | POA: Diagnosis not present

## 2017-01-23 DIAGNOSIS — E1122 Type 2 diabetes mellitus with diabetic chronic kidney disease: Secondary | ICD-10-CM | POA: Diagnosis not present

## 2017-01-23 LAB — CBC WITH DIFFERENTIAL/PLATELET
Basophils Absolute: 0.1 10*3/uL (ref 0.0–0.1)
Basophils Relative: 1 %
Eosinophils Absolute: 0.2 10*3/uL (ref 0.0–0.7)
Eosinophils Relative: 3 %
HEMATOCRIT: 37.8 % (ref 36.0–46.0)
HEMOGLOBIN: 13 g/dL (ref 12.0–15.0)
LYMPHS ABS: 1.2 10*3/uL (ref 0.7–4.0)
Lymphocytes Relative: 17 %
MCH: 29 pg (ref 26.0–34.0)
MCHC: 34.4 g/dL (ref 30.0–36.0)
MCV: 84.4 fL (ref 78.0–100.0)
MONOS PCT: 5 %
Monocytes Absolute: 0.4 10*3/uL (ref 0.1–1.0)
NEUTROS ABS: 5.2 10*3/uL (ref 1.7–7.7)
NEUTROS PCT: 74 %
Platelets: 228 10*3/uL (ref 150–400)
RBC: 4.48 MIL/uL (ref 3.87–5.11)
RDW: 13.7 % (ref 11.5–15.5)
WBC: 6.9 10*3/uL (ref 4.0–10.5)

## 2017-01-23 LAB — COMPREHENSIVE METABOLIC PANEL
ALK PHOS: 111 U/L (ref 38–126)
ALT: 20 U/L (ref 14–54)
AST: 23 U/L (ref 15–41)
Albumin: 4.4 g/dL (ref 3.5–5.0)
Anion gap: 9 (ref 5–15)
BILIRUBIN TOTAL: 0.5 mg/dL (ref 0.3–1.2)
BUN: 18 mg/dL (ref 6–20)
CALCIUM: 9.2 mg/dL (ref 8.9–10.3)
CO2: 25 mmol/L (ref 22–32)
Chloride: 100 mmol/L — ABNORMAL LOW (ref 101–111)
Creatinine, Ser: 1.28 mg/dL — ABNORMAL HIGH (ref 0.44–1.00)
GFR, EST AFRICAN AMERICAN: 49 mL/min — AB (ref >60)
GFR, EST NON AFRICAN AMERICAN: 43 mL/min — AB (ref >60)
GLUCOSE: 127 mg/dL — AB (ref 65–99)
Potassium: 3.1 mmol/L — ABNORMAL LOW (ref 3.5–5.1)
Sodium: 134 mmol/L — ABNORMAL LOW (ref 135–145)
TOTAL PROTEIN: 8.3 g/dL — AB (ref 6.5–8.1)

## 2017-01-23 MED ORDER — DICYCLOMINE HCL 20 MG PO TABS: ORAL_TABLET | ORAL | 0 refills | Status: DC

## 2017-01-23 NOTE — ED Triage Notes (Signed)
Pt states she was sent by [name] for evaluation of diarrhea for 5 days.  Denies abd pain or diarrhea.

## 2017-01-23 NOTE — Discharge Instructions (Signed)
Follow up with your md or dr. Oneida Alar if not improving.

## 2017-01-23 NOTE — ED Provider Notes (Signed)
[facility] DEPT Provider Note   CSN: [id number] Arrival date & time: 01/23/17  1615     History   Chief Complaint Chief Complaint  Patient presents with  . Diarrhea    HPI Kristen Garner is a 67 y.o. female.  Patient complains of diarrhea for 5 days. Some abdominal cramping. No blood in her diarrhea. Patient was sent over here by her family doctor for evaluation   The history is provided by the patient. No language interpreter was used.  Diarrhea   This is a new problem. The current episode started more than 2 days ago. The problem occurs 2 to 4 times per day. The problem has not changed since onset.The stool consistency is described as watery. There has been no fever. Pertinent negatives include no abdominal pain, no chills, no headaches and no cough. She has tried nothing for the symptoms.    Past Medical History:  Diagnosis Date  . Abnormal chest CT    a. CTA 09/2016: ""1.4 cm aortopulmonary window node seen, of uncertain significance"  . Anxiety   . Breast cancer ([place])    right  . CAD (coronary artery disease)    a. 09/2016: cath showing 3-v disease with severe stenosis along RCA, CTO of PL branch, mild LAD stenosis, and moderate D1 stenosis. S/p DES x2 to RCA on 10/14/2016.  Marland Kitchen Chronic systolic CHF (congestive heart failure) ([place])    a. 09/2016: Echo showing EF of 20-25% with diffuse HK  . CKD (chronic kidney disease), stage III   . Depression   . Diabetes mellitus    takes only the pill  . Headache(784.0)    occiasional migraine  . Hyperlipidemia   . Hypertension   . Hypokalemia   . Noncompliance 01/04/2015  . Osteopenia 01/04/2015  . PAT (paroxysmal atrial tachycardia) ([place])   . PVC's (premature ventricular contractions)   . Skin cancer 2015   right leg    Patient Active Problem List   Diagnosis Date Noted  . Atherosclerotic heart disease of native coronary artery with other forms of angina pectoris ([place]) 10/14/2016  . Atrial tachycardia ([place])  10/14/2016  . Unstable angina ([place])   . Cardiomyopathy, ischemic   . Elevated troponin   . Acute on chronic combined systolic and diastolic CHF (congestive heart failure) ([facility])     Class: Acute  . SOB (shortness of breath) 10/10/2016  . Unifocal PVCs 10/10/2016  . GERD (gastroesophageal reflux disease) 10/10/2016  . Troponin level elevated 10/10/2016  . Osteopenia 01/04/2015  . Noncompliance 01/04/2015  . UTI (urinary tract infection) 04/05/2013  . Acute renal failure ([place]) 04/04/2013  . Diarrhea 04/04/2013  . Dehydration 04/04/2013  . Hypokalemia 04/04/2013  . Hyponatremia 04/04/2013  . Metabolic acidosis 32/20/2542  . Leucocytosis 04/04/2013  . Diabetes ([place]) 04/04/2013  . Essential hypertension 04/04/2013  . Infiltrating ductal carcinoma of breast ([place]) 10/05/2012  . HEARTBURN 08/23/2010  . ANXIETY DEPRESSION 07/15/2010  . ATRIAL TACHYCARDIA 07/15/2010    Past Surgical History:  Procedure Laterality Date  . ABDOMINAL HYSTERECTOMY    . BREAST BIOPSY Right    biopsy then wide excision   . CARDIAC CATHETERIZATION N/A 10/13/2016   Procedure: Left Heart Cath and Coronary Angiography;  Surgeon: Burnell Blanks, MD;  Location: [facility];  Service: Cardiovascular;  Laterality: N/A;  . CARDIAC CATHETERIZATION N/A 10/14/2016   Procedure: Coronary Stent Intervention;  Surgeon: Burnell Blanks, MD;  Location: [facility];  Service: Cardiovascular;  Laterality: N/A;  .  CHOLECYSTECTOMY    . CORONARY ANGIOPLASTY WITH STENT PLACEMENT  10/14/2016  . SENTINEL LYMPH NODE BIOPSY    . SKIN CANCER EXCISION Right 2015    OB History    No data available       Home Medications    Prior to Admission medications   Medication Sig Start Date End Date Taking? Authorizing Provider  Alogliptin Benzoate (NESINA) 6.25 MG TABS Take 1 tablet by mouth daily.    Historical Provider, MD  ALPRAZolam Duanne Moron) 1 MG tablet Take 1 mg by mouth 2 (two) times daily.  09/16/16    Historical Provider, MD  aspirin EC 81 MG EC tablet Take 1 tablet (81 mg total) by mouth daily. 10/16/16   Erma Heritage, PA  clopidogrel (PLAVIX) 75 MG tablet Take 1 tablet (75 mg total) by mouth daily. 11/12/16   Deboraha Sprang, MD  dicyclomine (BENTYL) 20 MG tablet Take one every 6 hours as needed for abdominal cramps 01/23/17   Milton Ferguson, MD  furosemide (LASIX) 40 MG tablet Take 0.5 tablets (20 mg total) by mouth daily. 11/12/16   Deboraha Sprang, MD  HYDROcodone-acetaminophen (NORCO/VICODIN) 5-325 MG tablet Take 1 tablet by mouth every 6 (six) hours as needed for moderate pain or severe pain.  09/25/16   Historical Provider, MD  metoprolol succinate (TOPROL-XL) 50 MG 24 hr tablet Take 1 tablet (50 mg total) by mouth 2 (two) times daily. Take with or immediately following a meal. 11/12/16 02/10/17  Deboraha Sprang, MD  nitroGLYCERIN (NITROSTAT) 0.4 MG SL tablet Place 0.4 mg under the tongue every 5 (five) minutes as needed for chest pain.    Historical Provider, MD  pantoprazole (PROTONIX) 40 MG tablet Take 1 tablet (40 mg total) by mouth daily. 11/12/16   Deboraha Sprang, MD  Potassium Chloride ER 20 MEQ TBCR Take 20 mEq by mouth daily. 11/12/16   Deboraha Sprang, MD  zolpidem (AMBIEN) 10 MG tablet Take 10 mg by mouth at bedtime.    Historical Provider, MD    Family History Family History  Problem Relation Age of Onset  . Diabetes Father   . Stroke Maternal Grandmother     Social History Social History  Substance Use Topics  . Smoking status: Former Research scientist (life sciences)  . Smokeless tobacco: Never Used  . Alcohol use No     Allergies   Codeine; Penicillins; Sulfonamide derivatives; and Iodine   Review of Systems Review of Systems  Constitutional: Negative for appetite change, chills and fatigue.  HENT: Negative for congestion, ear discharge and sinus pressure.   Eyes: Negative for discharge.  Respiratory: Negative for cough.   Cardiovascular: Negative for chest pain.    Gastrointestinal: Positive for diarrhea. Negative for abdominal pain.  Genitourinary: Negative for frequency and hematuria.  Musculoskeletal: Negative for back pain.  Skin: Negative for rash.  Neurological: Negative for seizures and headaches.  Psychiatric/Behavioral: Negative for hallucinations.     Physical Exam Updated Vital Signs BP 160/82   Pulse 78   Temp 97.9 F (36.6 C) (Oral)   Resp 18   Ht 5\' 4"  (6.213 m)   Wt 168 lb (76.2 kg)   SpO2 100%   BMI 28.84 kg/m   Physical Exam  Constitutional: She is oriented to person, place, and time. She appears well-developed.  HENT:  Head: Normocephalic.  Eyes: Conjunctivae and EOM are normal. No scleral icterus.  Neck: Neck supple. No thyromegaly present.  Cardiovascular: Normal rate and regular rhythm.  Exam reveals no  gallop and no friction rub.   No murmur heard. Pulmonary/Chest: No stridor. She has no wheezes. She has no rales. She exhibits no tenderness.  Abdominal: She exhibits no distension. There is no tenderness. There is no rebound.  Musculoskeletal: Normal range of motion. She exhibits no edema.  Lymphadenopathy:    She has no cervical adenopathy.  Neurological: She is oriented to person, place, and time. She exhibits normal muscle tone. Coordination normal.  Skin: No rash noted. No erythema.  Psychiatric: She has a normal mood and affect. Her behavior is normal.     ED Treatments / Results  Labs (all labs ordered are listed, but only abnormal results are displayed) Labs Reviewed  COMPREHENSIVE METABOLIC PANEL - Abnormal; Notable for the following:       Result Value   Sodium 134 (*)    Potassium 3.1 (*)    Chloride 100 (*)    Glucose, Bld 127 (*)    Creatinine, Ser 1.28 (*)    Total Protein 8.3 (*)    GFR calc non Af Amer 43 (*)    GFR calc Af Amer 49 (*)    All other components within normal limits  CBC WITH DIFFERENTIAL/PLATELET    EKG  EKG Interpretation None       Radiology Dg Abd Acute  W/chest  Result Date: 01/23/2017 CLINICAL DATA:  Diarrhea for 5 days, history CHF, hypertension, diabetes mellitus, breast cancer EXAM: DG ABDOMEN ACUTE W/ 1V CHEST COMPARISON:  Chest radiograph 10/10/2016 FINDINGS: Enlargement of cardiac silhouette. Atherosclerotic calcification aorta. Mediastinal contours and pulmonary vascularity normal. Resolution of interstitial infiltrates seen on the previous exam. No acute infiltrate, pleural effusion or pneumothorax. Scattered gas in colon. Numerous air-filled loops of normal caliber small bowel throughout abdomen. Gas is seen to the sigmoid colon questionably into the rectum. No definite bowel wall thickening, evidence of obstruction, or free air. Bones diffusely demineralized. IMPRESSION: Mild enlargement of cardiac silhouette. Clear lungs. Nonspecific bowel gas pattern with air-filled large and small bowel loops identified without definite evidence of obstruction, wall thickening or perforation. Electronically Signed   By: Lavonia Dana M.D.   On: 01/23/2017 17:38    Procedures Procedures (including critical care time)  Medications Ordered in ED Medications - No data to display   Initial Impression / Assessment and Plan / ED Course  I have reviewed the triage vital signs and the nursing notes.  Pertinent labs & imaging results that were available during my care of the patient were reviewed by me and considered in my medical decision making (see chart for details).     Patient with gastroenteritis. She is given Bentyl for abdominal cramps and referred to GI or she can follow-up with her family doctor next week if she is not improving  Final Clinical Impressions(s) / ED Diagnoses   Final diagnoses:  Gastroenteritis    New Prescriptions New Prescriptions   DICYCLOMINE (BENTYL) 20 MG TABLET    Take one every 6 hours as needed for abdominal cramps     Milton Ferguson, MD 01/23/17 1900

## 2017-02-10 ENCOUNTER — Ambulatory Visit: Payer: PPO | Admitting: Cardiovascular Disease

## 2017-02-16 DIAGNOSIS — G894 Chronic pain syndrome: Secondary | ICD-10-CM | POA: Diagnosis not present

## 2017-02-16 DIAGNOSIS — Z6827 Body mass index (BMI) 27.0-27.9, adult: Secondary | ICD-10-CM | POA: Diagnosis not present

## 2017-02-23 ENCOUNTER — Other Ambulatory Visit (HOSPITAL_COMMUNITY): Payer: Self-pay | Admitting: Family Medicine

## 2017-02-23 DIAGNOSIS — Z09 Encounter for follow-up examination after completed treatment for conditions other than malignant neoplasm: Secondary | ICD-10-CM

## 2017-02-23 DIAGNOSIS — N644 Mastodynia: Secondary | ICD-10-CM

## 2017-02-24 ENCOUNTER — Other Ambulatory Visit (HOSPITAL_COMMUNITY): Payer: Self-pay | Admitting: Family Medicine

## 2017-02-26 ENCOUNTER — Other Ambulatory Visit (HOSPITAL_COMMUNITY): Payer: Self-pay | Admitting: Family Medicine

## 2017-02-26 DIAGNOSIS — N644 Mastodynia: Secondary | ICD-10-CM

## 2017-02-26 DIAGNOSIS — Z09 Encounter for follow-up examination after completed treatment for conditions other than malignant neoplasm: Secondary | ICD-10-CM

## 2017-03-05 ENCOUNTER — Ambulatory Visit: Payer: Self-pay | Admitting: "Endocrinology

## 2017-03-13 DIAGNOSIS — G894 Chronic pain syndrome: Secondary | ICD-10-CM | POA: Diagnosis not present

## 2017-03-13 DIAGNOSIS — Z6827 Body mass index (BMI) 27.0-27.9, adult: Secondary | ICD-10-CM | POA: Diagnosis not present

## 2017-03-17 ENCOUNTER — Ambulatory Visit (INDEPENDENT_AMBULATORY_CARE_PROVIDER_SITE_OTHER): Payer: PPO | Admitting: Cardiovascular Disease

## 2017-03-17 ENCOUNTER — Encounter: Payer: Self-pay | Admitting: Cardiovascular Disease

## 2017-03-17 VITALS — BP 146/80 | HR 85 | Ht 66.0 in | Wt 171.0 lb

## 2017-03-17 DIAGNOSIS — N183 Chronic kidney disease, stage 3 unspecified: Secondary | ICD-10-CM

## 2017-03-17 DIAGNOSIS — I519 Heart disease, unspecified: Secondary | ICD-10-CM

## 2017-03-17 DIAGNOSIS — I25118 Atherosclerotic heart disease of native coronary artery with other forms of angina pectoris: Secondary | ICD-10-CM

## 2017-03-17 DIAGNOSIS — I10 Essential (primary) hypertension: Secondary | ICD-10-CM | POA: Diagnosis not present

## 2017-03-17 DIAGNOSIS — I471 Supraventricular tachycardia: Secondary | ICD-10-CM

## 2017-03-17 DIAGNOSIS — I4729 Other ventricular tachycardia: Secondary | ICD-10-CM

## 2017-03-17 DIAGNOSIS — E782 Mixed hyperlipidemia: Secondary | ICD-10-CM

## 2017-03-17 DIAGNOSIS — I5042 Chronic combined systolic (congestive) and diastolic (congestive) heart failure: Secondary | ICD-10-CM

## 2017-03-17 DIAGNOSIS — I472 Ventricular tachycardia: Secondary | ICD-10-CM

## 2017-03-17 MED ORDER — ROSUVASTATIN CALCIUM 5 MG PO TABS: 5.0000 mg | ORAL_TABLET | Freq: Every day | ORAL | 3 refills | Status: DC

## 2017-03-17 NOTE — Progress Notes (Signed)
SUBJECTIVE: The patient presents for follow-up of coronary artery disease and chronic systolic heart failure. She also has a history of atrial tachycardia and nonsustained ventricular tachycardia.   This is my first time formally evaluating her.  Echocardiogram 11/13/16: Severely reduced left ventricular systolic function, LVEF 74-25%, mild LVH, mild left ventricular enlargement, grade 1 diastolic dysfunction, mild mitral regurgitation, mild left atrial enlargement.  She has severe triple vessel coronary artery disease. She underwent drug-eluting stent placement to the proximal and mid RCA on 10/14/16.  She denies chest pain, exertional dyspnea, leg swelling, palpitations, orthopnea, and paroxysmal nocturnal dyspnea.  She refuses to take Lipitor and was discharged on 80 mg daily.  I reviewed her labs performed on 01/19/17: Hemoglobin 13.5, platelets 204, BUN 23, creatinine 1.26, total cholesterol 260, triglycerides 240, HDL 37, LDL 175.   Review of Systems: As per "subjective", otherwise negative.  Allergies  Allergen Reactions  . Codeine Itching  . Penicillins Other (See Comments)    Child hood allergy  . Sulfonamide Derivatives Itching  . Iodine     Patient states that she got a cat scratch and her mom put iodine on it and then got catch scratch fever    Current Outpatient Prescriptions  Medication Sig Dispense Refill  . Alogliptin Benzoate (NESINA) 6.25 MG TABS Take 1 tablet by mouth daily.    Marland Kitchen ALPRAZolam (XANAX) 1 MG tablet Take 1 mg by mouth 2 (two) times daily.     Marland Kitchen aspirin EC 81 MG EC tablet Take 1 tablet (81 mg total) by mouth daily.    . clopidogrel (PLAVIX) 75 MG tablet Take 1 tablet (75 mg total) by mouth daily. 30 tablet 11  . dicyclomine (BENTYL) 20 MG tablet Take one every 6 hours as needed for abdominal cramps 20 tablet 0  . furosemide (LASIX) 40 MG tablet Take 0.5 tablets (20 mg total) by mouth daily. 15 tablet 11  . HYDROcodone-acetaminophen  (NORCO/VICODIN) 5-325 MG tablet Take 1 tablet by mouth every 6 (six) hours as needed for moderate pain or severe pain.     . nitroGLYCERIN (NITROSTAT) 0.4 MG SL tablet Place 0.4 mg under the tongue every 5 (five) minutes as needed for chest pain.    . pantoprazole (PROTONIX) 40 MG tablet Take 1 tablet (40 mg total) by mouth daily. 30 tablet 11  . Potassium Chloride ER 20 MEQ TBCR Take 20 mEq by mouth daily. 30 tablet 11  . zolpidem (AMBIEN) 10 MG tablet Take 10 mg by mouth at bedtime.    . metoprolol succinate (TOPROL-XL) 50 MG 24 hr tablet Take 1 tablet (50 mg total) by mouth 2 (two) times daily. Take with or immediately following a meal. 60 tablet 11   No current facility-administered medications for this visit.     Past Medical History:  Diagnosis Date  . Abnormal chest CT    a. CTA 09/2016: ""1.4 cm aortopulmonary window node seen, of uncertain significance"  . Anxiety   . Breast cancer ([place])    right  . CAD (coronary artery disease)    a. 09/2016: cath showing 3-v disease with severe stenosis along RCA, CTO of PL branch, mild LAD stenosis, and moderate D1 stenosis. S/p DES x2 to RCA on 10/14/2016.  Marland Kitchen Chronic systolic CHF (congestive heart failure) ([place])    a. 09/2016: Echo showing EF of 20-25% with diffuse HK  . CKD (chronic kidney disease), stage III   . Depression   . Diabetes mellitus  takes only the pill  . Headache(784.0)    occiasional migraine  . Hyperlipidemia   . Hypertension   . Hypokalemia   . Noncompliance 01/04/2015  . Osteopenia 01/04/2015  . PAT (paroxysmal atrial tachycardia) ([place])   . PVC's (premature ventricular contractions)   . Skin cancer 2015   right leg    Past Surgical History:  Procedure Laterality Date  . ABDOMINAL HYSTERECTOMY    . BREAST BIOPSY Right    biopsy then wide excision   . CARDIAC CATHETERIZATION N/A 10/13/2016   Procedure: Left Heart Cath and Coronary Angiography;  Surgeon: Burnell Blanks, MD;  Location: [facility];  Service: Cardiovascular;  Laterality: N/A;  . CARDIAC CATHETERIZATION N/A 10/14/2016   Procedure: Coronary Stent Intervention;  Surgeon: Burnell Blanks, MD;  Location: [facility];  Service: Cardiovascular;  Laterality: N/A;  . CHOLECYSTECTOMY    . CORONARY ANGIOPLASTY WITH STENT PLACEMENT  10/14/2016  . SENTINEL LYMPH NODE BIOPSY    . SKIN CANCER EXCISION Right 2015    Social History   Social History  . Marital status: Widowed    Spouse name: N/A  . Number of children: N/A  . Years of education: N/A   Occupational History  . Not on file.   Social History Main Topics  . Smoking status: Former Research scientist (life sciences)  . Smokeless tobacco: Never Used  . Alcohol use No  . Drug use: No  . Sexual activity: Yes    Birth control/ protection: Surgical   Other Topics Concern  . Not on file   Social History Narrative  . No narrative on file     Vitals:   03/17/17 0843  BP: (!) 146/80  Pulse: 85  SpO2: 93%  Weight: 171 lb (77.6 kg)  Height: 5\' 6"  (1.676 m)    Wt Readings from Last 3 Encounters:  03/17/17 171 lb (77.6 kg)  01/23/17 168 lb (76.2 kg)  11/12/16 171 lb (77.6 kg)     PHYSICAL EXAM General: NAD HEENT: Normal. Neck: No JVD, no thyromegaly. Lungs: Clear to auscultation bilaterally with normal respiratory effort. CV: Nondisplaced PMI.  Regular rate and rhythm, normal S1/S2, no J6/R6, 1/6 systolic murmur over RUSB and along LSB. No pretibial or periankle edema.  No carotid bruit.   Abdomen: Soft, nontender, no distention.  Neurologic: Alert and oriented.  Psych: Normal affect. Skin: Normal. Musculoskeletal: No gross deformities.    ECG: Most recent ECG reviewed.   Labs: Lab Results  Component Value Date/Time   K 3.1 (L) 01/23/2017 05:28 PM   BUN 18 01/23/2017 05:28 PM   CREATININE 1.28 (H) 01/23/2017 05:28 PM   CREATININE 1.21 (H) 11/12/2016 02:57 PM   ALT 20 01/23/2017 05:28 PM   TSH 2.285 10/10/2016 09:56 PM   TSH 3.391 07/20/2010 08:30  AM   HGB 13.0 01/23/2017 05:28 PM     Lipids: No results found for: LDLCALC, LDLDIRECT, CHOL, TRIG, HDL     ASSESSMENT AND PLAN: 1. CAD with 2 stents to the RCA in 09/2016: Continue ASA, Plavix, and metoprolol. Unclear why she is not on a statin.   2. Chronic systolic heart failure: Euvolemic on Lasix 20 mg daily. Continue metoprolol. Not on ACEI/ARB due to CKD stage 3. As creatinine only 1.26, I may still consider low-dose Entresto in the future depending upon LVEF.  3. Atrial tachycardia: Continue metoprolol succinate.  4. NSVT: Continue metoprolol succinate.  5. Severe LV dysfunction: I will repeat an echocardiogram to see if ICD  implantation is indicated. Continue Toprol-XL. Not on ACEI/ARB due to CKD stage 3.  6. Hypertension: Mildly elevated. She has chronic kidney disease stage III. I may still consider the introduction of an angiotensin II receptor blocker.  7. Severe dyslipidemia: Lipids reviewed above. Markedly elevated. I will start Crestor 5 mg to see if she can tolerate this low dose. I will repeat lipids before her next visit.   Disposition: Follow up 3 months.  Time spent: 40 minutes, of which greater than 50% was spent reviewing symptoms, relevant blood tests and studies, and discussing management plan with the patient.  Kate Sable, M.D., F.A.C.C.

## 2017-03-17 NOTE — Patient Instructions (Signed)
Your physician recommends that you schedule a follow-up appointment in: 3 months, please get Lipids drawn [place] visit    START Crestor 5 mg daily    Your physician has requested that you have an echocardiogram. Echocardiography is a painless test that uses sound waves to create images of your heart. It provides your doctor with information about the size and shape of your heart and how well your heart's chambers and valves are working. This procedure takes approximately one hour. There are no restrictions for this procedure.       Thank you for choosing [facility] !

## 2017-03-23 ENCOUNTER — Ambulatory Visit (HOSPITAL_COMMUNITY): Admission: RE | Admit: 2017-03-23 | Payer: PPO | Source: Ambulatory Visit

## 2017-03-28 ENCOUNTER — Emergency Department (HOSPITAL_COMMUNITY)
Admission: EM | Admit: 2017-03-28 | Discharge: 2017-03-28 | Disposition: A | Discharge status: Home / Self Care | Payer: PPO | Attending: Emergency Medicine | Admitting: Emergency Medicine

## 2017-03-28 ENCOUNTER — Emergency Department (HOSPITAL_COMMUNITY): Payer: PPO

## 2017-03-28 ENCOUNTER — Encounter (HOSPITAL_COMMUNITY): Payer: Self-pay | Admitting: Emergency Medicine

## 2017-03-28 DIAGNOSIS — N183 Chronic kidney disease, stage 3 (moderate): Secondary | ICD-10-CM | POA: Diagnosis not present

## 2017-03-28 DIAGNOSIS — Z87891 Personal history of nicotine dependence: Secondary | ICD-10-CM | POA: Diagnosis not present

## 2017-03-28 DIAGNOSIS — R0602 Shortness of breath: Secondary | ICD-10-CM | POA: Diagnosis not present

## 2017-03-28 DIAGNOSIS — Z7982 Long term (current) use of aspirin: Secondary | ICD-10-CM | POA: Diagnosis not present

## 2017-03-28 DIAGNOSIS — I130 Hypertensive heart and chronic kidney disease with heart failure and stage 1 through stage 4 chronic kidney disease, or unspecified chronic kidney disease: Secondary | ICD-10-CM | POA: Insufficient documentation

## 2017-03-28 DIAGNOSIS — J40 Bronchitis, not specified as acute or chronic: Secondary | ICD-10-CM | POA: Diagnosis not present

## 2017-03-28 DIAGNOSIS — E1122 Type 2 diabetes mellitus with diabetic chronic kidney disease: Secondary | ICD-10-CM | POA: Insufficient documentation

## 2017-03-28 DIAGNOSIS — I2510 Atherosclerotic heart disease of native coronary artery without angina pectoris: Secondary | ICD-10-CM | POA: Insufficient documentation

## 2017-03-28 DIAGNOSIS — I5043 Acute on chronic combined systolic (congestive) and diastolic (congestive) heart failure: Secondary | ICD-10-CM | POA: Insufficient documentation

## 2017-03-28 DIAGNOSIS — Z853 Personal history of malignant neoplasm of breast: Secondary | ICD-10-CM | POA: Diagnosis not present

## 2017-03-28 LAB — BASIC METABOLIC PANEL
Anion gap: 7 (ref 5–15)
BUN: 15 mg/dL (ref 6–20)
CO2: 29 mmol/L (ref 22–32)
CREATININE: 1.14 mg/dL — AB (ref 0.44–1.00)
Calcium: 9 mg/dL (ref 8.9–10.3)
Chloride: 101 mmol/L (ref 101–111)
GFR calc Af Amer: 57 mL/min — ABNORMAL LOW (ref >60)
GFR, EST NON AFRICAN AMERICAN: 49 mL/min — AB (ref >60)
Glucose, Bld: 226 mg/dL — ABNORMAL HIGH (ref 65–99)
POTASSIUM: 4.9 mmol/L (ref 3.5–5.1)
SODIUM: 137 mmol/L (ref 135–145)

## 2017-03-28 LAB — BRAIN NATRIURETIC PEPTIDE: B NATRIURETIC PEPTIDE 5: 338 pg/mL — AB (ref 0.0–100.0)

## 2017-03-28 LAB — CBC WITH DIFFERENTIAL/PLATELET
BASOS ABS: 0 10*3/uL (ref 0.0–0.1)
BASOS PCT: 1 %
EOS ABS: 0.2 10*3/uL (ref 0.0–0.7)
EOS PCT: 3 %
HCT: 39.2 % (ref 36.0–46.0)
Hemoglobin: 13 g/dL (ref 12.0–15.0)
Lymphocytes Relative: 15 %
Lymphs Abs: 1.2 10*3/uL (ref 0.7–4.0)
MCH: 28.8 pg (ref 26.0–34.0)
MCHC: 33.2 g/dL (ref 30.0–36.0)
MCV: 86.9 fL (ref 78.0–100.0)
MONO ABS: 0.5 10*3/uL (ref 0.1–1.0)
MONOS PCT: 6 %
Neutro Abs: 6 10*3/uL (ref 1.7–7.7)
Neutrophils Relative %: 75 %
PLATELETS: 193 10*3/uL (ref 150–400)
RBC: 4.51 MIL/uL (ref 3.87–5.11)
RDW: 13.9 % (ref 11.5–15.5)
WBC: 8 10*3/uL (ref 4.0–10.5)

## 2017-03-28 LAB — I-STAT TROPONIN, ED: Troponin i, poc: 0.01 ng/mL (ref 0.00–0.08)

## 2017-03-28 MED ORDER — ALBUTEROL SULFATE (2.5 MG/3ML) 0.083% IN NEBU
2.5000 mg | INHALATION_SOLUTION | Freq: Once | RESPIRATORY_TRACT | Status: AC
  Administered 2017-03-28: 2.5 mg via RESPIRATORY_TRACT
  Filled 2017-03-28: qty 3

## 2017-03-28 MED ORDER — PREDNISONE 20 MG PO TABS: 40.0000 mg | ORAL_TABLET | Freq: Every day | ORAL | 0 refills | Status: DC

## 2017-03-28 MED ORDER — MOMETASONE FUROATE 50 MCG/ACT NA SUSP: 2.0000 | Freq: Every day | NASAL | 12 refills | Status: DC

## 2017-03-28 MED ORDER — ALBUTEROL SULFATE HFA 108 (90 BASE) MCG/ACT IN AERS: 2.0000 | INHALATION_SPRAY | RESPIRATORY_TRACT | 2 refills | Status: AC | PRN

## 2017-03-28 MED ORDER — LEVOFLOXACIN 750 MG PO TABS: 750.0000 mg | ORAL_TABLET | Freq: Every day | ORAL | 0 refills | Status: DC

## 2017-03-28 MED ORDER — IPRATROPIUM-ALBUTEROL 0.5-2.5 (3) MG/3ML IN SOLN
3.0000 mL | Freq: Once | RESPIRATORY_TRACT | Status: AC
Start: 2017-03-28 — End: 2017-03-28
  Administered 2017-03-28: 3 mL via RESPIRATORY_TRACT
  Filled 2017-03-28: qty 3

## 2017-03-28 MED ORDER — METHYLPREDNISOLONE SODIUM SUCC 125 MG IJ SOLR
125.0000 mg | Freq: Once | INTRAMUSCULAR | Status: AC
  Administered 2017-03-28: 125 mg via INTRAVENOUS
  Filled 2017-03-28: qty 2

## 2017-03-28 NOTE — ED Triage Notes (Signed)
Pt c/o sob since midnight. Pt denies any chest pain.

## 2017-03-28 NOTE — ED Notes (Signed)
Two failed attempts at IV access.  Pt tolerated well

## 2017-03-28 NOTE — ED Provider Notes (Signed)
[facility] DEPT Provider Note   CSN: [id number] Arrival date & time: 03/28/17  0515     History   Chief Complaint Chief Complaint  Patient presents with  . Shortness of Breath    HPI Kristen Garner is a 66 y.o. female.  Patient presents to the emergency department for evaluation of shortness of breath. She reports that she started having chest congestion and some wheezing tonight to go. She has had cough associated with the symptoms. Tonight she became very short of breath. She used her inhaler but it did not help.      Past Medical History:  Diagnosis Date  . Abnormal chest CT    a. CTA 09/2016: ""1.4 cm aortopulmonary window node seen, of uncertain significance"  . Anxiety   . Breast cancer ([place])    right  . Breast cancer ([place])   . CAD (coronary artery disease)    a. 09/2016: cath showing 3-v disease with severe stenosis along RCA, CTO of PL branch, mild LAD stenosis, and moderate D1 stenosis. S/p DES x2 to RCA on 10/14/2016.  Marland Kitchen Chronic systolic CHF (congestive heart failure) ([place])    a. 09/2016: Echo showing EF of 20-25% with diffuse HK  . CKD (chronic kidney disease), stage III   . Depression   . Diabetes mellitus    takes only the pill  . Headache(784.0)    occiasional migraine  . Hyperlipidemia   . Hypertension   . Hypokalemia   . Noncompliance 01/04/2015  . Osteopenia 01/04/2015  . PAT (paroxysmal atrial tachycardia) ([place])   . PVC's (premature ventricular contractions)   . Skin cancer 2015   right leg    Patient Active Problem List   Diagnosis Date Noted  . Atherosclerotic heart disease of native coronary artery with other forms of angina pectoris ([place]) 10/14/2016  . Atrial tachycardia ([place]) 10/14/2016  . Unstable angina ([place])   . Cardiomyopathy, ischemic   . Elevated troponin   . Acute on chronic combined systolic and diastolic CHF (congestive heart failure) ([facility])     Class: Acute  . SOB (shortness of breath) 10/10/2016  . Unifocal PVCs  10/10/2016  . GERD (gastroesophageal reflux disease) 10/10/2016  . Troponin level elevated 10/10/2016  . Osteopenia 01/04/2015  . Noncompliance 01/04/2015  . UTI (urinary tract infection) 04/05/2013  . Acute renal failure ([place]) 04/04/2013  . Diarrhea 04/04/2013  . Dehydration 04/04/2013  . Hypokalemia 04/04/2013  . Hyponatremia 04/04/2013  . Metabolic acidosis 18/56/3149  . Leucocytosis 04/04/2013  . Diabetes ([place]) 04/04/2013  . Essential hypertension 04/04/2013  . Infiltrating ductal carcinoma of breast ([place]) 10/05/2012  . HEARTBURN 08/23/2010  . ANXIETY DEPRESSION 07/15/2010  . ATRIAL TACHYCARDIA 07/15/2010    Past Surgical History:  Procedure Laterality Date  . ABDOMINAL HYSTERECTOMY    . BREAST BIOPSY Right    biopsy then wide excision   . CARDIAC CATHETERIZATION N/A 10/13/2016   Procedure: Left Heart Cath and Coronary Angiography;  Surgeon: Burnell Blanks, MD;  Location: [facility];  Service: Cardiovascular;  Laterality: N/A;  . CARDIAC CATHETERIZATION N/A 10/14/2016   Procedure: Coronary Stent Intervention;  Surgeon: Burnell Blanks, MD;  Location: [facility];  Service: Cardiovascular;  Laterality: N/A;  . CHOLECYSTECTOMY    . CORONARY ANGIOPLASTY WITH STENT PLACEMENT  10/14/2016  . PARTIAL MASTECTOMY WITH AXILLARY SENTINEL LYMPH NODE BIOPSY    . SENTINEL LYMPH NODE BIOPSY    . SKIN CANCER EXCISION Right 2015    OB History  No data available       Home Medications    Prior to Admission medications   Medication Sig Start Date End Date Taking? Authorizing Provider  Alogliptin Benzoate (NESINA) 6.25 MG TABS Take 1 tablet by mouth daily.    [provider]  ALPRAZolam Duanne Moron) 1 MG tablet Take 1 mg by mouth 2 (two) times daily.  09/16/16   [provider]  aspirin EC 81 MG EC tablet Take 1 tablet (81 mg total) by mouth daily. 10/16/16   Strader, Fransisco Hertz, PA-C  clopidogrel (PLAVIX) 75 MG tablet Take 1 tablet (75  mg total) by mouth daily. 11/12/16   Deboraha Sprang, MD  dicyclomine (BENTYL) 20 MG tablet Take one every 6 hours as needed for abdominal cramps 01/23/17   Milton Ferguson, MD  furosemide (LASIX) 40 MG tablet Take 0.5 tablets (20 mg total) by mouth daily. 11/12/16   Deboraha Sprang, MD  HYDROcodone-acetaminophen (NORCO/VICODIN) 5-325 MG tablet Take 1 tablet by mouth every 6 (six) hours as needed for moderate pain or severe pain.  09/25/16   [provider]  metoprolol succinate (TOPROL-XL) 50 MG 24 hr tablet Take 1 tablet (50 mg total) by mouth 2 (two) times daily. Take with or immediately following a meal. 11/12/16 02/10/17  Deboraha Sprang, MD  nitroGLYCERIN (NITROSTAT) 0.4 MG SL tablet Place 0.4 mg under the tongue every 5 (five) minutes as needed for chest pain.    [provider]  pantoprazole (PROTONIX) 40 MG tablet Take 1 tablet (40 mg total) by mouth daily. 11/12/16   Deboraha Sprang, MD  Potassium Chloride ER 20 MEQ TBCR Take 20 mEq by mouth daily. 11/12/16   Deboraha Sprang, MD  rosuvastatin (CRESTOR) 5 MG tablet Take 1 tablet (5 mg total) by mouth daily. 03/17/17 06/15/17  Herminio Commons, MD  zolpidem (AMBIEN) 10 MG tablet Take 10 mg by mouth at bedtime.    [provider]    Family History Family History  Problem Relation Age of Onset  . Diabetes Father   . Stroke Maternal Grandmother     Social History Social History  Substance Use Topics  . Smoking status: Former Research scientist (life sciences)  . Smokeless tobacco: Never Used  . Alcohol use No     Allergies   Codeine; Penicillins; Sulfonamide derivatives; and Iodine   Review of Systems Review of Systems  Respiratory: Positive for shortness of breath and wheezing.   All other systems reviewed and are negative.    Physical Exam Updated Vital Signs BP (!) 167/81   Pulse 94   Temp 98.1 F (36.7 C)   Resp 16   Ht 5\' 6"  (1.676 m)   Wt 171 lb (77.6 kg)   SpO2 96%   BMI 27.60 kg/m   Physical Exam    Constitutional: She is oriented to person, place, and time. She appears well-developed and well-nourished. No distress.  HENT:  Head: Normocephalic and atraumatic.  Right Ear: Hearing normal.  Left Ear: Hearing normal.  Nose: Nose normal.  Mouth/Throat: Oropharynx is clear and moist and mucous membranes are normal.  Eyes: Conjunctivae and EOM are normal. Pupils are equal, round, and reactive to light.  Neck: Normal range of motion. Neck supple.  Cardiovascular: Regular rhythm, S1 normal and S2 normal.  Exam reveals no gallop and no friction rub.   No murmur heard. Pulmonary/Chest: Accessory muscle usage present. No respiratory distress. She has decreased breath sounds. She exhibits no tenderness.  Abdominal: Soft. Normal appearance  and bowel sounds are normal. There is no hepatosplenomegaly. There is no tenderness. There is no rebound, no guarding, no tenderness at McBurney's point and negative Murphy's sign. No hernia.  Musculoskeletal: Normal range of motion.  Neurological: She is alert and oriented to person, place, and time. She has normal strength. No cranial nerve deficit or sensory deficit. Coordination normal. GCS eye subscore is 4. GCS verbal subscore is 5. GCS motor subscore is 6.  Skin: Skin is warm, dry and intact. No rash noted. No cyanosis.  Psychiatric: She has a normal mood and affect. Her speech is normal and behavior is normal. Thought content normal.  Nursing note and vitals reviewed.    ED Treatments / Results  Labs (all labs ordered are listed, but only abnormal results are displayed) Labs Reviewed  BASIC METABOLIC PANEL - Abnormal; Notable for the following:       Result Value   Glucose, Bld 226 (*)    Creatinine, Ser 1.14 (*)    GFR calc non Af Amer 49 (*)    GFR calc Af Amer 57 (*)    All other components within normal limits  BRAIN NATRIURETIC PEPTIDE - Abnormal; Notable for the following:    B Natriuretic Peptide 338.0 (*)    All other components within  normal limits  CBC WITH DIFFERENTIAL/PLATELET  Randolm Idol, ED    EKG  EKG Interpretation  Date/Time:  Saturday Mar 28 2017 05:27:33 EDT Ventricular Rate:  92 PR Interval:    QRS Duration: 115 QT Interval:  386 QTC Calculation: 478 R Axis:   55 Text Interpretation:  Sinus rhythm Nonspecific intraventricular conduction delay Anteroseptal infarct, old Repol abnrm suggests ischemia, lateral leads No significant change since last tracing Confirmed by Ada Holness  MD, Shivaay Stormont ([id number]) on 03/28/2017 6:01:22 AM       Radiology No results found.  Procedures Procedures (including critical care time)  Medications Ordered in ED Medications  ipratropium-albuterol (DUONEB) 0.5-2.5 (3) MG/3ML nebulizer solution 3 mL (3 mLs Nebulization Given 03/28/17 0552)  albuterol (PROVENTIL) (2.5 MG/3ML) 0.083% nebulizer solution 2.5 mg (2.5 mg Nebulization Given 03/28/17 0552)  methylPREDNISolone sodium succinate (SOLU-MEDROL) 125 mg/2 mL injection 125 mg (125 mg Intravenous Given 03/28/17 0606)     Initial Impression / Assessment and Plan / ED Course  I have reviewed the triage vital signs and the nursing notes.  Pertinent labs & imaging results that were available during my care of the patient were reviewed by me and considered in my medical decision making (see chart for details).     Patient has been sick for nearly a week with sinus congestion, postnasal drip, chest congestion and cough. Breathing difficulty worsened overnight. Patient had evidence of wheezing at arrival. She was given bronchodilator therapy with improvement. Respiratory distress has resolved and she is now 95-96% on room air. Will discharge with treatment for bronchospasm. Final Clinical Impressions(s) / ED Diagnoses   Final diagnoses:  Bronchitis    New Prescriptions New Prescriptions   No medications on file     Orpah Greek, MD 03/28/17 [phone]

## 2017-03-28 NOTE — ED Notes (Signed)
Pt. Back from xray

## 2017-03-31 ENCOUNTER — Ambulatory Visit (HOSPITAL_COMMUNITY)
Admission: RE | Admit: 2017-03-31 | Discharge: 2017-03-31 | Disposition: A | Discharge status: Home / Self Care | Payer: PPO | Source: Ambulatory Visit | Attending: Family Medicine | Admitting: Family Medicine

## 2017-03-31 DIAGNOSIS — N644 Mastodynia: Secondary | ICD-10-CM | POA: Insufficient documentation

## 2017-03-31 DIAGNOSIS — Z09 Encounter for follow-up examination after completed treatment for conditions other than malignant neoplasm: Secondary | ICD-10-CM

## 2017-03-31 DIAGNOSIS — R928 Other abnormal and inconclusive findings on diagnostic imaging of breast: Secondary | ICD-10-CM | POA: Diagnosis not present

## 2017-04-17 DIAGNOSIS — G894 Chronic pain syndrome: Secondary | ICD-10-CM | POA: Diagnosis not present

## 2017-04-17 DIAGNOSIS — Z6828 Body mass index (BMI) 28.0-28.9, adult: Secondary | ICD-10-CM | POA: Diagnosis not present

## 2017-04-17 DIAGNOSIS — J309 Allergic rhinitis, unspecified: Secondary | ICD-10-CM | POA: Diagnosis not present

## 2017-04-17 DIAGNOSIS — J209 Acute bronchitis, unspecified: Secondary | ICD-10-CM | POA: Diagnosis not present

## 2017-05-18 DIAGNOSIS — Z6829 Body mass index (BMI) 29.0-29.9, adult: Secondary | ICD-10-CM | POA: Diagnosis not present

## 2017-05-18 DIAGNOSIS — L723 Sebaceous cyst: Secondary | ICD-10-CM | POA: Diagnosis not present

## 2017-05-18 DIAGNOSIS — G894 Chronic pain syndrome: Secondary | ICD-10-CM | POA: Diagnosis not present

## 2017-05-18 DIAGNOSIS — Z1389 Encounter for screening for other disorder: Secondary | ICD-10-CM | POA: Diagnosis not present

## 2017-05-18 DIAGNOSIS — E748 Other specified disorders of carbohydrate metabolism: Secondary | ICD-10-CM | POA: Diagnosis not present

## 2017-05-18 DIAGNOSIS — E1140 Type 2 diabetes mellitus with diabetic neuropathy, unspecified: Secondary | ICD-10-CM | POA: Diagnosis not present

## 2017-05-18 DIAGNOSIS — G2581 Restless legs syndrome: Secondary | ICD-10-CM | POA: Diagnosis not present

## 2017-06-16 ENCOUNTER — Ambulatory Visit (INDEPENDENT_AMBULATORY_CARE_PROVIDER_SITE_OTHER): Payer: PPO | Admitting: General Surgery

## 2017-06-16 ENCOUNTER — Encounter: Payer: Self-pay | Admitting: General Surgery

## 2017-06-16 VITALS — BP 161/76 | HR 77 | Temp 97.3°F | Resp 18 | Ht 65.0 in | Wt 178.0 lb

## 2017-06-16 DIAGNOSIS — L723 Sebaceous cyst: Secondary | ICD-10-CM | POA: Diagnosis not present

## 2017-06-16 NOTE — Patient Instructions (Signed)
Epidermal Cyst An epidermal cyst is a small, painless lump under your skin. It may be called an epidermal inclusion cyst or an infundibular cyst. The cyst contains a grayish-white, bad-smelling substance (keratin). It is important not to pop epidermal cysts yourself. These cysts are usually harmless (benign), but they can get infected. Symptoms of infection may include:  Redness.  Inflammation.  Tenderness.  Warmth.  Fever.  A grayish-white, bad-smelling substance draining from the cyst.  Pus draining from the cyst.  Follow these instructions at home:  Take over-the-counter and prescription medicines only as told by your doctor.  If you were prescribed an antibiotic, use it as told by your doctor. Do not stop using the antibiotic even if you start to feel better.  Keep the area around your cyst clean and dry.  Wear loose, dry clothing.  Do not try to pop your cyst.  Avoid touching your cyst.  Check your cyst every day for signs of infection.  Keep all follow-up visits as told by your doctor. This is important. How is this prevented?  Wear clean, dry, clothing.  Avoid wearing tight clothing.  Keep your skin clean and dry. Shower or take baths every day.  Wash your body with a benzoyl peroxide wash when you shower or bathe. Contact a health care provider if:  Your cyst has symptoms of infection.  Your condition is not improving or is getting worse.  You have a cyst that looks different from other cysts you have had.  You have a fever. Get help right away if:  Redness spreads from the cyst into the surrounding area. This information is not intended to replace advice given to you by your health care provider. Make sure you discuss any questions you have with your health care provider. Document Released: 12/18/2004 Document Revised: 07/09/2016 Document Reviewed: 09/12/2015 Elsevier Interactive Patient Education  2018 Elsevier Inc.  

## 2017-06-16 NOTE — Progress Notes (Signed)
Kristen Garner; [id number]; 1951/03/16   HPI   Patient is a 66 year old white female who was referred to my care by Dr. Gerarda Fraction  For evaluation and treatment of a sebaceous cyst on the back of her neck.  She states has been present for decades.  She has had recent swelling over the past year.  It is not particularly tender.  She has 0/10 pain.  She has never had drainage from the cyst.  It is only mildly tender when she touches it.  She does have multiple medical problems including congestive heart failure, status post stent placement in 2017 with the need to be on Plavix. Past Medical History:  Diagnosis Date  . Abnormal chest CT    a. CTA 09/2016: ""1.4 cm aortopulmonary window node seen, of uncertain significance"  . Anxiety   . Breast cancer ([place])    right  . Breast cancer ([place])   . CAD (coronary artery disease)    a. 09/2016: cath showing 3-v disease with severe stenosis along RCA, CTO of PL branch, mild LAD stenosis, and moderate D1 stenosis. S/p DES x2 to RCA on 10/14/2016.  Marland Kitchen Chronic systolic CHF (congestive heart failure) ([place])    a. 09/2016: Echo showing EF of 20-25% with diffuse HK  . CKD (chronic kidney disease), stage III   . Depression   . Diabetes mellitus    takes only the pill  . Headache(784.0)    occiasional migraine  . Hyperlipidemia   . Hypertension   . Hypokalemia   . Noncompliance 01/04/2015  . Osteopenia 01/04/2015  . PAT (paroxysmal atrial tachycardia) ([place])   . PVC's (premature ventricular contractions)   . Skin cancer 2015   right leg    Past Surgical History:  Procedure Laterality Date  . ABDOMINAL HYSTERECTOMY    . BREAST BIOPSY Right    biopsy then wide excision   . CARDIAC CATHETERIZATION N/A 10/13/2016   Procedure: Left Heart Cath and Coronary Angiography;  Surgeon: Burnell Blanks, MD;  Location: [facility];  Service: Cardiovascular;  Laterality: N/A;  . CARDIAC CATHETERIZATION N/A 10/14/2016   Procedure: Coronary Stent  Intervention;  Surgeon: Burnell Blanks, MD;  Location: [facility];  Service: Cardiovascular;  Laterality: N/A;  . CHOLECYSTECTOMY    . CORONARY ANGIOPLASTY WITH STENT PLACEMENT  10/14/2016  . PARTIAL MASTECTOMY WITH AXILLARY SENTINEL LYMPH NODE BIOPSY    . SENTINEL LYMPH NODE BIOPSY    . SKIN CANCER EXCISION Right 2015    Family History  Problem Relation Age of Onset  . Diabetes Father   . Stroke Maternal Grandmother     Current Outpatient Prescriptions on File Prior to Visit  Medication Sig Dispense Refill  . albuterol (PROVENTIL HFA;VENTOLIN HFA) 108 (90 Base) MCG/ACT inhaler Inhale 2 puffs into the lungs every 4 (four) hours as needed for wheezing or shortness of breath. 1 Inhaler 2  . Alogliptin Benzoate (NESINA) 6.25 MG TABS Take 1 tablet by mouth daily.    Marland Kitchen ALPRAZolam (XANAX) 1 MG tablet Take 1 mg by mouth 2 (two) times daily.     Marland Kitchen aspirin EC 81 MG EC tablet Take 1 tablet (81 mg total) by mouth daily.    . clopidogrel (PLAVIX) 75 MG tablet Take 1 tablet (75 mg total) by mouth daily. 30 tablet 11  . dicyclomine (BENTYL) 20 MG tablet Take one every 6 hours as needed for abdominal cramps 20 tablet 0  . furosemide (LASIX) 40 MG tablet Take 0.5 tablets (  20 mg total) by mouth daily. 15 tablet 11  . HYDROcodone-acetaminophen (NORCO/VICODIN) 5-325 MG tablet Take 1 tablet by mouth every 6 (six) hours as needed for moderate pain or severe pain.     Marland Kitchen levofloxacin (LEVAQUIN) 750 MG tablet Take 1 tablet (750 mg total) by mouth daily. 5 tablet 0  . metoprolol succinate (TOPROL-XL) 50 MG 24 hr tablet Take 1 tablet (50 mg total) by mouth 2 (two) times daily. Take with or immediately following a meal. 60 tablet 11  . mometasone (NASONEX) 50 MCG/ACT nasal spray Place 2 sprays into the nose daily. 17 g 12  . nitroGLYCERIN (NITROSTAT) 0.4 MG SL tablet Place 0.4 mg under the tongue every 5 (five) minutes as needed for chest pain.    . pantoprazole (PROTONIX) 40 MG tablet Take 1 tablet  (40 mg total) by mouth daily. 30 tablet 11  . Potassium Chloride ER 20 MEQ TBCR Take 20 mEq by mouth daily. 30 tablet 11  . predniSONE (DELTASONE) 20 MG tablet Take 2 tablets (40 mg total) by mouth daily with breakfast. 10 tablet 0  . rosuvastatin (CRESTOR) 5 MG tablet Take 1 tablet (5 mg total) by mouth daily. 90 tablet 3  . zolpidem (AMBIEN) 10 MG tablet Take 10 mg by mouth at bedtime.     No current facility-administered medications on file prior to visit.     Allergies  Allergen Reactions  . Codeine Itching  . Penicillins Other (See Comments)    Child hood allergy  . Sulfonamide Derivatives Itching  . Iodine     Patient states that she got a cat scratch and her mom put iodine on it and then got catch scratch fever    History  Alcohol Use No    History  Smoking Status  . Former Smoker  Smokeless Tobacco  . Never Used    Review of Systems  Constitutional: Negative.   HENT: Negative.   Eyes: Negative.   Respiratory: Negative.   Cardiovascular: Negative.   Gastrointestinal: Negative.   Genitourinary: Negative.   Musculoskeletal: Negative.   Skin: Negative.   Neurological: Negative.   Endo/Heme/Allergies: Bruises/bleeds easily.  Psychiatric/Behavioral: Negative.     Objective   Vitals:   06/16/17 1359  BP: (!) 161/76  Pulse: 77  Resp: 18  Temp: (!) 97.3 F (36.3 C)    Physical Exam  Constitutional: She is oriented to person, place, and time and well-developed, well-nourished, and in no distress.  HENT:  Head: Normocephalic and atraumatic.  Neck:  3 cm sebaceous cyst on the left posterior neck.  No drainage is noted.  No erythema is noted.  Cardiovascular: Normal rate, regular rhythm and normal heart sounds.   No murmur heard. Pulmonary/Chest: Effort normal and breath sounds normal. She has no wheezes. She has no rales.  Neurological: She is alert and oriented to person, place, and time.  Vitals reviewed.    Cardiac records reviewed Assessment    sebaceous cyst, neck, not inflamed at this time. Plan     As patient has a cardiac ejection fraction of 20% as well as chronic Plavix use, formal excision in the OR is high risk.  I told the patient that I can do and I indeed in the office when she is off Plavix should the need arise.  She does not want  Anything done at this time.  She was instructed to return should she develop drainage or worsening pain.

## 2017-06-24 ENCOUNTER — Encounter: Payer: Self-pay | Admitting: Cardiovascular Disease

## 2017-06-24 ENCOUNTER — Ambulatory Visit: Payer: PPO | Admitting: Cardiovascular Disease

## 2017-07-06 ENCOUNTER — Emergency Department (HOSPITAL_COMMUNITY)
Admission: EM | Admit: 2017-07-06 | Discharge: 2017-07-06 | Disposition: A | Discharge status: Home / Self Care | Payer: PPO | Attending: Emergency Medicine | Admitting: Emergency Medicine

## 2017-07-06 ENCOUNTER — Encounter (HOSPITAL_COMMUNITY): Payer: Self-pay | Admitting: Emergency Medicine

## 2017-07-06 DIAGNOSIS — Z87891 Personal history of nicotine dependence: Secondary | ICD-10-CM | POA: Diagnosis not present

## 2017-07-06 DIAGNOSIS — Z853 Personal history of malignant neoplasm of breast: Secondary | ICD-10-CM | POA: Insufficient documentation

## 2017-07-06 DIAGNOSIS — Z7984 Long term (current) use of oral hypoglycemic drugs: Secondary | ICD-10-CM | POA: Insufficient documentation

## 2017-07-06 DIAGNOSIS — I2510 Atherosclerotic heart disease of native coronary artery without angina pectoris: Secondary | ICD-10-CM | POA: Insufficient documentation

## 2017-07-06 DIAGNOSIS — I130 Hypertensive heart and chronic kidney disease with heart failure and stage 1 through stage 4 chronic kidney disease, or unspecified chronic kidney disease: Secondary | ICD-10-CM | POA: Insufficient documentation

## 2017-07-06 DIAGNOSIS — E1122 Type 2 diabetes mellitus with diabetic chronic kidney disease: Secondary | ICD-10-CM | POA: Diagnosis not present

## 2017-07-06 DIAGNOSIS — H9222 Otorrhagia, left ear: Secondary | ICD-10-CM | POA: Diagnosis not present

## 2017-07-06 DIAGNOSIS — I5043 Acute on chronic combined systolic (congestive) and diastolic (congestive) heart failure: Secondary | ICD-10-CM | POA: Diagnosis not present

## 2017-07-06 DIAGNOSIS — N183 Chronic kidney disease, stage 3 (moderate): Secondary | ICD-10-CM | POA: Insufficient documentation

## 2017-07-06 DIAGNOSIS — I110 Hypertensive heart disease with heart failure: Secondary | ICD-10-CM | POA: Diagnosis not present

## 2017-07-06 LAB — CBG MONITORING, ED: Glucose-Capillary: 246 mg/dL — ABNORMAL HIGH (ref 65–99)

## 2017-07-06 MED ORDER — OXYMETAZOLINE HCL 0.05 % NA SOLN
1.0000 | Freq: Once | NASAL | Status: AC
  Administered 2017-07-06: 1 via NASAL
  Filled 2017-07-06: qty 15

## 2017-07-06 NOTE — Discharge Instructions (Signed)
You were seen in the ED today with ear bleeding. This should resolve on its own. Call the ENT doctor as needed if symptoms worsen. Use the Afrin in the ear twice a day as needed for continued bleeding. If there is no new bleeding then you do not have to use the Afrin.   Return to the ED with any new or worsening symptoms including worsening bleeding, infection, fever, chills, or worsening pain.

## 2017-07-06 NOTE — ED Triage Notes (Signed)
Pt states left ear started bleeding last night. Denies injury/pain rating 6. Pt c/o chronic cyst on back of neck that is hurting and radiating to right side of neck also. Nad.

## 2017-07-06 NOTE — ED Provider Notes (Signed)
Emergency Department Provider Note   I have reviewed the triage vital signs and the nursing notes.   HISTORY  Chief Complaint Ear Drainage   HPI Kristen Garner is a 66 y.o. female with PMH of neck cyst, DM, CHF, CAD, HTN, HLD urgency the emergency department for evaluation of bright red bleading from the left ear. Patient states she awoke and saw blood on her pillow. She put a piece of paper towel in the ear to help stop the bleeding but it continues today. She denies significant pain or hearing loss in the ear. She denies using Q-tips to clean her ear deeply in the canal. She denies any thick drainage from the ear. No fevers or chills. She is having some increasing neck discomfort from a cyst. She is being followed by outpatient general surgery but this has not been removed because she is currently on aspirin and Plavix would have to stop that for several days for cyst removal. Denies any numbness or tingling. No modifying factors. No radiation of symptoms.   Past Medical History:  Diagnosis Date  . Abnormal chest CT    a. CTA 09/2016: ""1.4 cm aortopulmonary window node seen, of uncertain significance"  . Anxiety   . Breast cancer ([place])    right  . Breast cancer ([place])   . CAD (coronary artery disease)    a. 09/2016: cath showing 3-v disease with severe stenosis along RCA, CTO of PL branch, mild LAD stenosis, and moderate D1 stenosis. S/p DES x2 to RCA on 10/14/2016.  Marland Kitchen Chronic systolic CHF (congestive heart failure) ([place])    a. 09/2016: Echo showing EF of 20-25% with diffuse HK  . CKD (chronic kidney disease), stage III   . Depression   . Diabetes mellitus    takes only the pill  . Headache(784.0)    occiasional migraine  . Hyperlipidemia   . Hypertension   . Hypokalemia   . Noncompliance 01/04/2015  . Osteopenia 01/04/2015  . PAT (paroxysmal atrial tachycardia) ([place])   . PVC's (premature ventricular contractions)   . Skin cancer 2015   right leg    Patient Active  Problem List   Diagnosis Date Noted  . Atherosclerotic heart disease of native coronary artery with other forms of angina pectoris ([place]) 10/14/2016  . Atrial tachycardia ([place]) 10/14/2016  . Unstable angina ([place])   . Cardiomyopathy, ischemic   . Elevated troponin   . Acute on chronic combined systolic and diastolic CHF (congestive heart failure) ([facility])     Class: Acute  . SOB (shortness of breath) 10/10/2016  . Unifocal PVCs 10/10/2016  . GERD (gastroesophageal reflux disease) 10/10/2016  . Troponin level elevated 10/10/2016  . Osteopenia 01/04/2015  . Noncompliance 01/04/2015  . UTI (urinary tract infection) 04/05/2013  . Acute renal failure ([place]) 04/04/2013  . Diarrhea 04/04/2013  . Dehydration 04/04/2013  . Hypokalemia 04/04/2013  . Hyponatremia 04/04/2013  . Metabolic acidosis 44/31/5400  . Leucocytosis 04/04/2013  . Diabetes ([place]) 04/04/2013  . Essential hypertension 04/04/2013  . Infiltrating ductal carcinoma of breast ([place]) 10/05/2012  . HEARTBURN 08/23/2010  . ANXIETY DEPRESSION 07/15/2010  . ATRIAL TACHYCARDIA 07/15/2010    Past Surgical History:  Procedure Laterality Date  . ABDOMINAL HYSTERECTOMY    . BREAST BIOPSY Right    biopsy then wide excision   . CARDIAC CATHETERIZATION N/A 10/13/2016   Procedure: Left Heart Cath and Coronary Angiography;  Surgeon: Burnell Blanks, MD;  Location: [facility];  Service: Cardiovascular;  Laterality:  N/A;  . CARDIAC CATHETERIZATION N/A 10/14/2016   Procedure: Coronary Stent Intervention;  Surgeon: Burnell Blanks, MD;  Location: [facility];  Service: Cardiovascular;  Laterality: N/A;  . CHOLECYSTECTOMY    . CORONARY ANGIOPLASTY WITH STENT PLACEMENT  10/14/2016  . PARTIAL MASTECTOMY WITH AXILLARY SENTINEL LYMPH NODE BIOPSY    . SENTINEL LYMPH NODE BIOPSY    . SKIN CANCER EXCISION Right 2015    Current Outpatient Rx  . Order #: [id number] Class: Print  . Order #: [id number] Class: Historical Med  .  Order #: [id number] Class: Historical Med  . Order #: [id number] Class: OTC  . Order #: [id number] Class: Normal  . Order #: [id number] Class: Print  . Order #: [id number] Class: Normal  . Order #: [id number] Class: Historical Med  . Order #: [id number] Class: Normal  . Order #: [id number] Class: Print  . Order #: [id number] Class: Historical Med  . Order #: [id number] Class: Normal  . Order #: [id number] Class: Normal  . Order #: [id number] Class: Normal  . Order #: [id number] Class: Historical Med  . Order #: [id number] Class: Print  . Order #: [id number] Class: Print    Allergies Codeine; Penicillins; Sulfonamide derivatives; and Iodine  Family History  Problem Relation Age of Onset  . Diabetes Father   . Stroke Maternal Grandmother     Social History Social History  Substance Use Topics  . Smoking status: Former Research scientist (life sciences)  . Smokeless tobacco: Never Used  . Alcohol use No    Review of Systems  Constitutional: No fever/chills Eyes: No visual changes. ENT: No sore throat. Left ear bleeding. Positive neck pain.  Cardiovascular: Denies chest pain. Respiratory: Denies shortness of breath. Gastrointestinal: No abdominal pain.  No nausea, no vomiting.  No diarrhea.  No constipation. Genitourinary: Negative for dysuria. Musculoskeletal: Negative for back pain. Skin: Negative for rash. Positive neck cyst.  Neurological: Negative for headaches, focal weakness or numbness.  10-point ROS otherwise negative.  ____________________________________________   PHYSICAL EXAM:  VITAL SIGNS: ED Triage Vitals  Enc Vitals Group     BP 07/06/17 1006 (!) 178/69     Pulse Rate 07/06/17 1006 76     Resp 07/06/17 1006 19     Temp 07/06/17 1006 98.1 F (36.7 C)     Temp Source 07/06/17 1006 Oral     SpO2 07/06/17 1006 99 %     Pain Score 07/06/17 1003 6   Constitutional: Alert and oriented. Well appearing and in no acute distress. Eyes: Conjunctivae are normal. Head: Atraumatic. Ears:  Healthy appearing  TMs bilaterally. BRB in left external canal. Abrasion on floor of canal. TM intact. No surrounding erythema. No purulent drainage.  Nose: No congestion/rhinnorhea. Mouth/Throat: Mucous membranes are moist.  Oropharynx non-erythematous. Neck: No stridor.  Cardiovascular: Normal rate, regular rhythm. Good peripheral circulation. Grossly normal heart sounds.   Respiratory: Normal respiratory effort.  No retractions. Lungs CTAB. Gastrointestinal: Soft and nontender. No distention.  Musculoskeletal: No lower extremity tenderness nor edema. No gross deformities of extremities. Neurologic:  Normal speech and language. No gross focal neurologic deficits are appreciated.  Skin:  Skin is warm, dry and intact. 3 x 3 cm cyst on neck just left of midline. No overlying erythema.   ____________________________________________   LABS (all labs ordered are listed, but only abnormal results are displayed)  Labs Reviewed  CBG MONITORING, ED - Abnormal; Notable for the following:       Result Value   Glucose-Capillary 246 (*)    All other components within normal limits   ____________________________________________   PROCEDURES  Procedure(s) performed:   Procedures  None ____________________________________________   INITIAL IMPRESSION / ASSESSMENT AND PLAN / ED COURSE  Pertinent labs & imaging results that were available during my care of the patient were reviewed by me and considered in my medical decision making (see chart for details).  Patient with bright red blood in the external auditory canal. Slight abrasion noted in the area. No evidence of otitis externa. The tympanic membrane is intact. Spoke with ENT Dr. Constance Holster who advises afrin in the left ear BID PRN and PCP follow up. If symptoms persist she can call the ENT clinic for further evaluation. Contact information provided at discharge.   At this time, I do not feel there is any life-threatening condition present. I have reviewed and  discussed all results (EKG, imaging, lab, urine as appropriate), exam findings with patient. I have reviewed nursing notes and appropriate previous records.  I feel the patient is safe to be discharged home without further emergent workup. Discussed usual and customary return precautions. Patient and family (if present) verbalize understanding and are comfortable with this plan.  Patient will follow-up with their primary care provider. If they do not have a primary care provider, information for follow-up has been provided to them. All questions have been answered.  ____________________________________________  FINAL CLINICAL IMPRESSION(S) / ED DIAGNOSES  Final diagnoses:  Otorrhagia of left ear     MEDICATIONS GIVEN DURING THIS VISIT:  Medications  oxymetazoline (AFRIN) 0.05 % nasal spray 1 spray (1 spray Each Nare Given 07/06/17 1136)     NEW OUTPATIENT MEDICATIONS STARTED DURING THIS VISIT:  None   Note:  This document was prepared using Dragon voice recognition software and may include unintentional dictation errors.  Nanda Quinton, MD Emergency Medicine    Long, Wonda [facility], MD 07/06/17 1425

## 2017-07-12 ENCOUNTER — Emergency Department (HOSPITAL_COMMUNITY)
Admission: EM | Admit: 2017-07-12 | Discharge: 2017-07-12 | Disposition: A | Discharge status: Home / Self Care | Payer: PPO | Attending: Emergency Medicine | Admitting: Emergency Medicine

## 2017-07-12 ENCOUNTER — Emergency Department (HOSPITAL_COMMUNITY): Payer: PPO

## 2017-07-12 ENCOUNTER — Encounter (HOSPITAL_COMMUNITY): Payer: Self-pay | Admitting: Emergency Medicine

## 2017-07-12 DIAGNOSIS — M47812 Spondylosis without myelopathy or radiculopathy, cervical region: Secondary | ICD-10-CM | POA: Insufficient documentation

## 2017-07-12 DIAGNOSIS — I5043 Acute on chronic combined systolic (congestive) and diastolic (congestive) heart failure: Secondary | ICD-10-CM | POA: Insufficient documentation

## 2017-07-12 DIAGNOSIS — I130 Hypertensive heart and chronic kidney disease with heart failure and stage 1 through stage 4 chronic kidney disease, or unspecified chronic kidney disease: Secondary | ICD-10-CM | POA: Diagnosis not present

## 2017-07-12 DIAGNOSIS — I25118 Atherosclerotic heart disease of native coronary artery with other forms of angina pectoris: Secondary | ICD-10-CM | POA: Insufficient documentation

## 2017-07-12 DIAGNOSIS — Z87891 Personal history of nicotine dependence: Secondary | ICD-10-CM | POA: Insufficient documentation

## 2017-07-12 DIAGNOSIS — M62838 Other muscle spasm: Secondary | ICD-10-CM | POA: Insufficient documentation

## 2017-07-12 DIAGNOSIS — Z7984 Long term (current) use of oral hypoglycemic drugs: Secondary | ICD-10-CM | POA: Insufficient documentation

## 2017-07-12 DIAGNOSIS — N183 Chronic kidney disease, stage 3 (moderate): Secondary | ICD-10-CM | POA: Diagnosis not present

## 2017-07-12 DIAGNOSIS — M542 Cervicalgia: Secondary | ICD-10-CM | POA: Diagnosis not present

## 2017-07-12 DIAGNOSIS — Z853 Personal history of malignant neoplasm of breast: Secondary | ICD-10-CM | POA: Diagnosis not present

## 2017-07-12 DIAGNOSIS — Z7982 Long term (current) use of aspirin: Secondary | ICD-10-CM | POA: Diagnosis not present

## 2017-07-12 DIAGNOSIS — Z85828 Personal history of other malignant neoplasm of skin: Secondary | ICD-10-CM | POA: Diagnosis not present

## 2017-07-12 DIAGNOSIS — E1122 Type 2 diabetes mellitus with diabetic chronic kidney disease: Secondary | ICD-10-CM | POA: Diagnosis not present

## 2017-07-12 MED ORDER — HYDROCODONE-ACETAMINOPHEN 5-325 MG PO TABS: ORAL_TABLET | ORAL | 0 refills | Status: DC

## 2017-07-12 MED ORDER — METHOCARBAMOL 500 MG PO TABS: 500.0000 mg | ORAL_TABLET | Freq: Three times a day (TID) | ORAL | 0 refills | Status: DC

## 2017-07-12 MED ORDER — HYDROCODONE-ACETAMINOPHEN 5-325 MG PO TABS
1.0000 | ORAL_TABLET | Freq: Once | ORAL | Status: AC
  Administered 2017-07-12: 1 via ORAL
  Filled 2017-07-12: qty 1

## 2017-07-12 NOTE — Discharge Instructions (Signed)
Alternate ice and heat to your neck. Follow-up with your primary doctor this week for recheck.

## 2017-07-12 NOTE — ED Triage Notes (Signed)
Pt c/o right sided neck/shoulder pain for a few days. Pt was seen for the same here a couple of days ago.

## 2017-07-14 ENCOUNTER — Emergency Department (HOSPITAL_COMMUNITY)
Admission: EM | Admit: 2017-07-14 | Discharge: 2017-07-14 | Disposition: A | Discharge status: Home / Self Care | Payer: PPO | Attending: Emergency Medicine | Admitting: Emergency Medicine

## 2017-07-14 ENCOUNTER — Encounter (HOSPITAL_COMMUNITY): Payer: Self-pay | Admitting: Cardiology

## 2017-07-14 DIAGNOSIS — B029 Zoster without complications: Secondary | ICD-10-CM

## 2017-07-14 DIAGNOSIS — Z87891 Personal history of nicotine dependence: Secondary | ICD-10-CM | POA: Insufficient documentation

## 2017-07-14 DIAGNOSIS — Z853 Personal history of malignant neoplasm of breast: Secondary | ICD-10-CM | POA: Insufficient documentation

## 2017-07-14 DIAGNOSIS — Z955 Presence of coronary angioplasty implant and graft: Secondary | ICD-10-CM | POA: Diagnosis not present

## 2017-07-14 DIAGNOSIS — M25511 Pain in right shoulder: Secondary | ICD-10-CM | POA: Insufficient documentation

## 2017-07-14 DIAGNOSIS — Z7982 Long term (current) use of aspirin: Secondary | ICD-10-CM | POA: Diagnosis not present

## 2017-07-14 DIAGNOSIS — Z85828 Personal history of other malignant neoplasm of skin: Secondary | ICD-10-CM | POA: Diagnosis not present

## 2017-07-14 DIAGNOSIS — I130 Hypertensive heart and chronic kidney disease with heart failure and stage 1 through stage 4 chronic kidney disease, or unspecified chronic kidney disease: Secondary | ICD-10-CM | POA: Diagnosis not present

## 2017-07-14 DIAGNOSIS — Z7984 Long term (current) use of oral hypoglycemic drugs: Secondary | ICD-10-CM | POA: Insufficient documentation

## 2017-07-14 DIAGNOSIS — I2510 Atherosclerotic heart disease of native coronary artery without angina pectoris: Secondary | ICD-10-CM | POA: Diagnosis not present

## 2017-07-14 DIAGNOSIS — E1122 Type 2 diabetes mellitus with diabetic chronic kidney disease: Secondary | ICD-10-CM | POA: Insufficient documentation

## 2017-07-14 DIAGNOSIS — Z79899 Other long term (current) drug therapy: Secondary | ICD-10-CM | POA: Insufficient documentation

## 2017-07-14 DIAGNOSIS — N183 Chronic kidney disease, stage 3 (moderate): Secondary | ICD-10-CM | POA: Insufficient documentation

## 2017-07-14 DIAGNOSIS — Z7902 Long term (current) use of antithrombotics/antiplatelets: Secondary | ICD-10-CM | POA: Diagnosis not present

## 2017-07-14 DIAGNOSIS — I5022 Chronic systolic (congestive) heart failure: Secondary | ICD-10-CM | POA: Diagnosis not present

## 2017-07-14 DIAGNOSIS — M542 Cervicalgia: Secondary | ICD-10-CM | POA: Diagnosis present

## 2017-07-14 MED ORDER — ACYCLOVIR 400 MG PO TABS: 400.0000 mg | ORAL_TABLET | Freq: Every day | ORAL | 0 refills | Status: DC

## 2017-07-14 MED ORDER — ACYCLOVIR 800 MG PO TABS
800.0000 mg | ORAL_TABLET | Freq: Once | ORAL | Status: AC
  Administered 2017-07-14: 800 mg via ORAL
  Filled 2017-07-14: qty 1

## 2017-07-14 MED ORDER — FLUORESCEIN SODIUM 0.6 MG OP STRP
1.0000 | ORAL_STRIP | Freq: Once | OPHTHALMIC | Status: AC
  Administered 2017-07-14: 1 via OPHTHALMIC

## 2017-07-14 MED ORDER — TETRACAINE HCL 0.5 % OP SOLN
2.0000 [drp] | Freq: Once | OPHTHALMIC | Status: AC
  Administered 2017-07-14: 2 [drp] via OPHTHALMIC
  Filled 2017-07-14: qty 4

## 2017-07-14 NOTE — ED Provider Notes (Signed)
[facility] DEPT Provider Note   CSN: [id number] Arrival date & time: 07/14/17  1430     History   Chief Complaint Chief Complaint  Patient presents with  . Torticollis    HPI Kristen Garner is a 66 y.o. female.  HPI   Kristen Garner is a 66 y.o. female who presents to the Emergency Department complaining of persistent right neck and shoulder pain for several days.  She was seen here two days ago by me for same.  She states the pain has become worse and now associated with a red rash to her right face, neck and scalp.  She was prescribed pain medication, but did not get the medication filled.  She states pain is constant and throbbing.  She also complains of generalized myalgias, she denies fever, visual changes, neck stiffness, numbness of the extremity   Past Medical History:  Diagnosis Date  . Abnormal chest CT    a. CTA 09/2016: ""1.4 cm aortopulmonary window node seen, of uncertain significance"  . Anxiety   . Breast cancer ([place])    right  . Breast cancer ([place])   . CAD (coronary artery disease)    a. 09/2016: cath showing 3-v disease with severe stenosis along RCA, CTO of PL branch, mild LAD stenosis, and moderate D1 stenosis. S/p DES x2 to RCA on 10/14/2016.  Marland Kitchen Chronic systolic CHF (congestive heart failure) ([place])    a. 09/2016: Echo showing EF of 20-25% with diffuse HK  . CKD (chronic kidney disease), stage III   . Depression   . Diabetes mellitus    takes only the pill  . Headache(784.0)    occiasional migraine  . Hyperlipidemia   . Hypertension   . Hypokalemia   . Noncompliance 01/04/2015  . Osteopenia 01/04/2015  . PAT (paroxysmal atrial tachycardia) ([place])   . PVC's (premature ventricular contractions)   . Skin cancer 2015   right leg    Patient Active Problem List   Diagnosis Date Noted  . Atherosclerotic heart disease of native coronary artery with other forms of angina pectoris ([place]) 10/14/2016  . Atrial tachycardia ([place]) 10/14/2016  .  Unstable angina ([place])   . Cardiomyopathy, ischemic   . Elevated troponin   . Acute on chronic combined systolic and diastolic CHF (congestive heart failure) ([facility])     Class: Acute  . SOB (shortness of breath) 10/10/2016  . Unifocal PVCs 10/10/2016  . GERD (gastroesophageal reflux disease) 10/10/2016  . Troponin level elevated 10/10/2016  . Osteopenia 01/04/2015  . Noncompliance 01/04/2015  . UTI (urinary tract infection) 04/05/2013  . Acute renal failure ([place]) 04/04/2013  . Diarrhea 04/04/2013  . Dehydration 04/04/2013  . Hypokalemia 04/04/2013  . Hyponatremia 04/04/2013  . Metabolic acidosis 50/38/8828  . Leucocytosis 04/04/2013  . Diabetes ([place]) 04/04/2013  . Essential hypertension 04/04/2013  . Infiltrating ductal carcinoma of breast ([place]) 10/05/2012  . HEARTBURN 08/23/2010  . ANXIETY DEPRESSION 07/15/2010  . ATRIAL TACHYCARDIA 07/15/2010    Past Surgical History:  Procedure Laterality Date  . ABDOMINAL HYSTERECTOMY    . BREAST BIOPSY Right    biopsy then wide excision   . CARDIAC CATHETERIZATION N/A 10/13/2016   Procedure: Left Heart Cath and Coronary Angiography;  Surgeon: Burnell Blanks, MD;  Location: [facility];  Service: Cardiovascular;  Laterality: N/A;  . CARDIAC CATHETERIZATION N/A 10/14/2016   Procedure: Coronary Stent Intervention;  Surgeon: Burnell Blanks, MD;  Location: [facility];  Service: Cardiovascular;  Laterality: N/A;  .  CHOLECYSTECTOMY    . CORONARY ANGIOPLASTY WITH STENT PLACEMENT  10/14/2016  . PARTIAL MASTECTOMY WITH AXILLARY SENTINEL LYMPH NODE BIOPSY    . SENTINEL LYMPH NODE BIOPSY    . SKIN CANCER EXCISION Right 2015    OB History    No data available       Home Medications    Prior to Admission medications   Medication Sig Start Date End Date Taking? Authorizing Provider  albuterol (PROVENTIL HFA;VENTOLIN HFA) 108 (90 Base) MCG/ACT inhaler Inhale 2 puffs into the lungs every 4 (four) hours as needed  for wheezing or shortness of breath. 03/28/17   Orpah Greek, MD  Alogliptin Benzoate (NESINA) 6.25 MG TABS Take 1 tablet by mouth daily.    [provider]  ALPRAZolam Duanne Moron) 1 MG tablet Take 1 mg by mouth 2 (two) times daily.  09/16/16   [provider]  aspirin EC 81 MG EC tablet Take 1 tablet (81 mg total) by mouth daily. 10/16/16   Strader, Fransisco Hertz, PA-C  clopidogrel (PLAVIX) 75 MG tablet Take 1 tablet (75 mg total) by mouth daily. 11/12/16   Deboraha Sprang, MD  dicyclomine (BENTYL) 20 MG tablet Take one every 6 hours as needed for abdominal cramps 01/23/17   Milton Ferguson, MD  furosemide (LASIX) 40 MG tablet Take 0.5 tablets (20 mg total) by mouth daily. 11/12/16   Deboraha Sprang, MD  HYDROcodone-acetaminophen (NORCO/VICODIN) 5-325 MG tablet Take one tab po q 6 hrs prn pain 07/12/17   Anisa Leanos, PA-C  levofloxacin (LEVAQUIN) 750 MG tablet Take 1 tablet (750 mg total) by mouth daily. Patient not taking: Reported on 07/06/2017 03/28/17   Orpah Greek, MD  methocarbamol (ROBAXIN) 500 MG tablet Take 1 tablet (500 mg total) by mouth 3 (three) times daily. 07/12/17   Aristotle Lieb, PA-C  metoprolol succinate (TOPROL-XL) 50 MG 24 hr tablet Take 1 tablet (50 mg total) by mouth 2 (two) times daily. Take with or immediately following a meal. 11/12/16 07/06/17  Deboraha Sprang, MD  mometasone (NASONEX) 50 MCG/ACT nasal spray Place 2 sprays into the nose daily. 03/28/17   Orpah Greek, MD  nitroGLYCERIN (NITROSTAT) 0.4 MG SL tablet Place 0.4 mg under the tongue every 5 (five) minutes as needed for chest pain.    [provider]  pantoprazole (PROTONIX) 40 MG tablet Take 1 tablet (40 mg total) by mouth daily. 11/12/16   Deboraha Sprang, MD  Potassium Chloride ER 20 MEQ TBCR Take 20 mEq by mouth daily. 11/12/16   Deboraha Sprang, MD  predniSONE (DELTASONE) 20 MG tablet Take 2 tablets (40 mg total) by mouth daily with breakfast. Patient not taking:  Reported on 07/06/2017 03/28/17   Orpah Greek, MD  rosuvastatin (CRESTOR) 5 MG tablet Take 1 tablet (5 mg total) by mouth daily. 03/17/17 07/06/17  Herminio Commons, MD  zolpidem (AMBIEN) 10 MG tablet Take 10 mg by mouth at bedtime.    [provider]    Family History Family History  Problem Relation Age of Onset  . Diabetes Father   . Stroke Maternal Grandmother     Social History Social History  Substance Use Topics  . Smoking status: Former Research scientist (life sciences)  . Smokeless tobacco: Never Used  . Alcohol use No     Allergies   Codeine; Penicillins; Sulfonamide derivatives; and Iodine   Review of Systems Review of Systems  Constitutional: Positive for fatigue. Negative for chills and fever.  Eyes: Negative for  visual disturbance.  Genitourinary: Negative for difficulty urinating and dysuria.  Musculoskeletal: Positive for myalgias and neck pain. Negative for arthralgias and joint swelling.  Skin: Positive for rash. Negative for color change and wound.  Neurological: Negative for dizziness, syncope, weakness, numbness and headaches.  All other systems reviewed and are negative.    Physical Exam Updated Vital Signs BP 119/69   Pulse 85   Temp 99 F (37.2 C) (Oral)   Resp 16   Ht 5\' 5"  (1.651 m)   Wt 80.7 kg (178 lb)   SpO2 95%   BMI 29.62 kg/m   Physical Exam  Constitutional: She is oriented to person, place, and time. She appears well-developed and well-nourished. No distress.  HENT:  Head: Normocephalic and atraumatic.  Mouth/Throat: Oropharynx is clear and moist.  Eyes: Pupils are equal, round, and reactive to light. Conjunctivae, EOM and lids are normal. Lids are everted and swept, no foreign bodies found. Right conjunctiva is not injected. Left conjunctiva is not injected.  Slit lamp exam:      The right eye shows no corneal flare, no corneal ulcer and no fluorescein uptake.  Slit lamp exam reveals negative Seidel's sign, no dendritic lesions seen  on fundoscopic exam  Neck: Normal range of motion. Neck supple. No thyromegaly present.  Cardiovascular: Normal rate, regular rhythm, normal heart sounds and intact distal pulses.   No murmur heard. Pulmonary/Chest: Effort normal and breath sounds normal. No respiratory distress.  Musculoskeletal: Normal range of motion.  ttp of the right trapezius and rhomboid muscles. 5/5 grip strength bilaterally  Lymphadenopathy:    She has no cervical adenopathy.  Neurological: She is alert and oriented to person, place, and time. No sensory deficit. She exhibits normal muscle tone. Coordination normal.  Skin: Skin is warm and dry. Capillary refill takes less than 2 seconds. No rash noted.  Erythematous, slightly vesicular rash to right parietal scalp, face and neck.    Psychiatric: She has a normal mood and affect.  Nursing note and vitals reviewed.    ED Treatments / Results  Labs (all labs ordered are listed, but only abnormal results are displayed) Labs Reviewed - No data to display  EKG  EKG Interpretation None       Radiology Dg Cervical Spine Complete  Result Date: 07/12/2017 CLINICAL DATA:  Neck pain. EXAM: CERVICAL SPINE - COMPLETE 4+ VIEW COMPARISON:  None. FINDINGS: There is no evidence of cervical spine fracture or prevertebral soft tissue swelling. Straightening of cervical lordosis. Mild multilevel osteoarthritic changes of the cervical spine. 3.2 cm soft tissue mass projects over the posterior neck and may represent the cyst mentioned by the patient. IMPRESSION: Mild multilevel osteoarthritic changes of the cervical spine. 3.2 cm soft tissue mass of the posterior neck. Electronically Signed   By: Fidela Salisbury M.D.   On: 07/12/2017 21:24    Procedures Procedures (including critical care time)  Medications Ordered in ED Medications  acyclovir (ZOVIRAX) tablet 800 mg (not administered)     Initial Impression / Assessment and Plan / ED Course  I have reviewed the  triage vital signs and the nursing notes.  Pertinent labs & imaging results that were available during my care of the patient were reviewed by me and considered in my medical decision making (see chart for details).     Pt seen by me two days ago for muscular pain, no rash seen at that time.  Today, erythematous early vesicular rash. No ocular involvement.  rx for acyclovir and  pt advised to get medications filled from previous visit. Pt has PCP f/u soon.    Final Clinical Impressions(s) / ED Diagnoses   Final diagnoses:  Herpes zoster without complication    New Prescriptions New Prescriptions   No medications on file     Kem Parkinson, Hershal Coria 07/16/17 2304    Forde Dandy, MD 07/22/17 [phone]

## 2017-07-14 NOTE — Discharge Instructions (Signed)
Be sure to get your medications filled today.  Follow-up with your doctor this week for recheck

## 2017-07-14 NOTE — ED Triage Notes (Addendum)
Right sided neck and shoulder pain times 3 weeks. Rash to right side of face and neck area.

## 2017-07-14 NOTE — ED Provider Notes (Signed)
[facility] DEPT Provider Note   CSN: [id number] Arrival date & time: 07/12/17  2033     History   Chief Complaint Chief Complaint  Patient presents with  . Neck Pain    HPI Kristen Garner is a 66 y.o. female.  HPI  Kristen Garner is a 66 y.o. female who presents to the Emergency Department complaining of right neck and shoulder pain for 2-3 days.  She describes a constant, aching pain across the top of the shoulder and right side of her neck and around the shoulder blade.  Pain associated with movement.  Pain improves at rest.  She has tried tylenol without relief.  She denies injury, headaches, dizziness, numbness or weakness of the upper extremities, chest pain or shortness of breath.  She was seen here recently for ear pain   Past Medical History:  Diagnosis Date  . Abnormal chest CT    a. CTA 09/2016: ""1.4 cm aortopulmonary window node seen, of uncertain significance"  . Anxiety   . Breast cancer ([place])    right  . Breast cancer ([place])   . CAD (coronary artery disease)    a. 09/2016: cath showing 3-v disease with severe stenosis along RCA, CTO of PL branch, mild LAD stenosis, and moderate D1 stenosis. S/p DES x2 to RCA on 10/14/2016.  Marland Kitchen Chronic systolic CHF (congestive heart failure) ([place])    a. 09/2016: Echo showing EF of 20-25% with diffuse HK  . CKD (chronic kidney disease), stage III   . Depression   . Diabetes mellitus    takes only the pill  . Headache(784.0)    occiasional migraine  . Hyperlipidemia   . Hypertension   . Hypokalemia   . Noncompliance 01/04/2015  . Osteopenia 01/04/2015  . PAT (paroxysmal atrial tachycardia) ([place])   . PVC's (premature ventricular contractions)   . Skin cancer 2015   right leg    Patient Active Problem List   Diagnosis Date Noted  . Atherosclerotic heart disease of native coronary artery with other forms of angina pectoris ([place]) 10/14/2016  . Atrial tachycardia ([place]) 10/14/2016  . Unstable angina ([place])   .  Cardiomyopathy, ischemic   . Elevated troponin   . Acute on chronic combined systolic and diastolic CHF (congestive heart failure) (HCC)     Class: Acute  . SOB (shortness of breath) 10/10/2016  . Unifocal PVCs 10/10/2016  . GERD (gastroesophageal reflux disease) 10/10/2016  . Troponin level elevated 10/10/2016  . Osteopenia 01/04/2015  . Noncompliance 01/04/2015  . UTI (urinary tract infection) 04/05/2013  . Acute renal failure ([place]) 04/04/2013  . Diarrhea 04/04/2013  . Dehydration 04/04/2013  . Hypokalemia 04/04/2013  . Hyponatremia 04/04/2013  . Metabolic acidosis 44/31/5400  . Leucocytosis 04/04/2013  . Diabetes ([place]) 04/04/2013  . Essential hypertension 04/04/2013  . Infiltrating ductal carcinoma of breast ([place]) 10/05/2012  . HEARTBURN 08/23/2010  . ANXIETY DEPRESSION 07/15/2010  . ATRIAL TACHYCARDIA 07/15/2010    Past Surgical History:  Procedure Laterality Date  . ABDOMINAL HYSTERECTOMY    . BREAST BIOPSY Right    biopsy then wide excision   . CARDIAC CATHETERIZATION N/A 10/13/2016   Procedure: Left Heart Cath and Coronary Angiography;  Surgeon: Burnell Blanks, MD;  Location: [facility];  Service: Cardiovascular;  Laterality: N/A;  . CARDIAC CATHETERIZATION N/A 10/14/2016   Procedure: Coronary Stent Intervention;  Surgeon: Burnell Blanks, MD;  Location: [facility];  Service: Cardiovascular;  Laterality: N/A;  . CHOLECYSTECTOMY    .  CORONARY ANGIOPLASTY WITH STENT PLACEMENT  10/14/2016  . PARTIAL MASTECTOMY WITH AXILLARY SENTINEL LYMPH NODE BIOPSY    . SENTINEL LYMPH NODE BIOPSY    . SKIN CANCER EXCISION Right 2015    OB History    No data available       Home Medications    Prior to Admission medications   Medication Sig Start Date End Date Taking? Authorizing Provider  albuterol (PROVENTIL HFA;VENTOLIN HFA) 108 (90 Base) MCG/ACT inhaler Inhale 2 puffs into the lungs every 4 (four) hours as needed for wheezing or shortness of  breath. 03/28/17   Orpah Greek, MD  Alogliptin Benzoate (NESINA) 6.25 MG TABS Take 1 tablet by mouth daily.    [provider]  ALPRAZolam Duanne Moron) 1 MG tablet Take 1 mg by mouth 2 (two) times daily.  09/16/16   [provider]  aspirin EC 81 MG EC tablet Take 1 tablet (81 mg total) by mouth daily. 10/16/16   Strader, Fransisco Hertz, PA-C  clopidogrel (PLAVIX) 75 MG tablet Take 1 tablet (75 mg total) by mouth daily. 11/12/16   Deboraha Sprang, MD  dicyclomine (BENTYL) 20 MG tablet Take one every 6 hours as needed for abdominal cramps 01/23/17   Milton Ferguson, MD  furosemide (LASIX) 40 MG tablet Take 0.5 tablets (20 mg total) by mouth daily. 11/12/16   Deboraha Sprang, MD  HYDROcodone-acetaminophen (NORCO/VICODIN) 5-325 MG tablet Take one tab po q 6 hrs prn pain 07/12/17   Janiaya Ryser, PA-C  levofloxacin (LEVAQUIN) 750 MG tablet Take 1 tablet (750 mg total) by mouth daily. Patient not taking: Reported on 07/06/2017 03/28/17   Orpah Greek, MD  methocarbamol (ROBAXIN) 500 MG tablet Take 1 tablet (500 mg total) by mouth 3 (three) times daily. 07/12/17   Bani Gianfrancesco, PA-C  metoprolol succinate (TOPROL-XL) 50 MG 24 hr tablet Take 1 tablet (50 mg total) by mouth 2 (two) times daily. Take with or immediately following a meal. 11/12/16 07/06/17  Deboraha Sprang, MD  mometasone (NASONEX) 50 MCG/ACT nasal spray Place 2 sprays into the nose daily. 03/28/17   Orpah Greek, MD  nitroGLYCERIN (NITROSTAT) 0.4 MG SL tablet Place 0.4 mg under the tongue every 5 (five) minutes as needed for chest pain.    [provider]  pantoprazole (PROTONIX) 40 MG tablet Take 1 tablet (40 mg total) by mouth daily. 11/12/16   Deboraha Sprang, MD  Potassium Chloride ER 20 MEQ TBCR Take 20 mEq by mouth daily. 11/12/16   Deboraha Sprang, MD  predniSONE (DELTASONE) 20 MG tablet Take 2 tablets (40 mg total) by mouth daily with breakfast. Patient not taking: Reported on 07/06/2017 03/28/17    Orpah Greek, MD  rosuvastatin (CRESTOR) 5 MG tablet Take 1 tablet (5 mg total) by mouth daily. 03/17/17 07/06/17  Herminio Commons, MD  zolpidem (AMBIEN) 10 MG tablet Take 10 mg by mouth at bedtime.    [provider]    Family History Family History  Problem Relation Age of Onset  . Diabetes Father   . Stroke Maternal Grandmother     Social History Social History  Substance Use Topics  . Smoking status: Former Research scientist (life sciences)  . Smokeless tobacco: Never Used  . Alcohol use No     Allergies   Codeine; Penicillins; Sulfonamide derivatives; and Iodine   Review of Systems Review of Systems  Constitutional: Negative for chills and fever.  Eyes: Negative for visual disturbance.  Respiratory: Negative for shortness of  breath.   Cardiovascular: Negative for chest pain.  Gastrointestinal: Negative for abdominal pain, nausea and vomiting.  Genitourinary: Negative for difficulty urinating and dysuria.  Musculoskeletal: Positive for myalgias and neck pain. Negative for arthralgias and joint swelling.  Skin: Negative for color change and wound.  Neurological: Negative for dizziness, weakness, numbness and headaches.  All other systems reviewed and are negative.    Physical Exam Updated Vital Signs BP (!) 143/95 (BP Location: Right Arm)   Pulse (!) 102   Temp 98.4 F (36.9 C) (Oral)   Resp 18   SpO2 100%   Physical Exam  Constitutional: She is oriented to person, place, and time. She appears well-developed and well-nourished. No distress.  HENT:  Head: Atraumatic.  Mouth/Throat: Oropharynx is clear and moist.  Neck: Normal range of motion and phonation normal. Muscular tenderness present.  Cardiovascular: Normal rate, regular rhythm and intact distal pulses.   Pulmonary/Chest: Effort normal and breath sounds normal. No respiratory distress.  Musculoskeletal: She exhibits tenderness.  ttp of the right SCM, trapezius and rhomboid muscles.  Has full ROM of the  right shoulder.  No bony deformity.  5/5 strength of BUE's  Neurological: She is alert and oriented to person, place, and time. No sensory deficit.  Skin: Skin is warm. Capillary refill takes less than 2 seconds.  Psychiatric: She has a normal mood and affect.  Nursing note and vitals reviewed.    ED Treatments / Results  Labs (all labs ordered are listed, but only abnormal results are displayed) Labs Reviewed - No data to display  EKG  EKG Interpretation None       Radiology Dg Cervical Spine Complete  Result Date: 07/12/2017 CLINICAL DATA:  Neck pain. EXAM: CERVICAL SPINE - COMPLETE 4+ VIEW COMPARISON:  None. FINDINGS: There is no evidence of cervical spine fracture or prevertebral soft tissue swelling. Straightening of cervical lordosis. Mild multilevel osteoarthritic changes of the cervical spine. 3.2 cm soft tissue mass projects over the posterior neck and may represent the cyst mentioned by the patient. IMPRESSION: Mild multilevel osteoarthritic changes of the cervical spine. 3.2 cm soft tissue mass of the posterior neck. Electronically Signed   By: Fidela Salisbury M.D.   On: 07/12/2017 21:24    Procedures Procedures (including critical care time)  Medications Ordered in ED Medications  HYDROcodone-acetaminophen (NORCO/VICODIN) 5-325 MG per tablet 1 tablet (1 tablet Oral Given 07/12/17 2149)     Initial Impression / Assessment and Plan / ED Course  I have reviewed the triage vital signs and the nursing notes.  Pertinent labs & imaging results that were available during my care of the patient were reviewed by me and considered in my medical decision making (see chart for details).     Pt well appearing.  NV intact.  muscular tenderness.  No concerning sx's for neurological process.  XR findings discussed. Pt agrees to tx plan, PCP f/u if needed.   Final Clinical Impressions(s) / ED Diagnoses   Final diagnoses:  Muscle spasms of neck  Spondylosis of cervical  region without myelopathy or radiculopathy    New Prescriptions Discharge Medication List as of 07/12/2017  9:39 PM    START taking these medications   Details  methocarbamol (ROBAXIN) 500 MG tablet Take 1 tablet (500 mg total) by mouth 3 (three) times daily., Starting Sun 07/12/2017, Print         Kiannah Grunow, PA-C 07/14/17 1323    Noemi Chapel, MD 07/16/17 2119

## 2017-07-16 DIAGNOSIS — G894 Chronic pain syndrome: Secondary | ICD-10-CM | POA: Diagnosis not present

## 2017-07-16 DIAGNOSIS — F419 Anxiety disorder, unspecified: Secondary | ICD-10-CM | POA: Diagnosis not present

## 2017-07-16 DIAGNOSIS — E663 Overweight: Secondary | ICD-10-CM | POA: Diagnosis not present

## 2017-07-16 DIAGNOSIS — F329 Major depressive disorder, single episode, unspecified: Secondary | ICD-10-CM | POA: Diagnosis not present

## 2017-07-16 DIAGNOSIS — Z6827 Body mass index (BMI) 27.0-27.9, adult: Secondary | ICD-10-CM | POA: Diagnosis not present

## 2017-07-21 ENCOUNTER — Emergency Department (HOSPITAL_COMMUNITY)
Admission: EM | Admit: 2017-07-21 | Discharge: 2017-07-21 | Disposition: A | Discharge status: Home / Self Care | Payer: PPO | Attending: Emergency Medicine | Admitting: Emergency Medicine

## 2017-07-21 ENCOUNTER — Encounter (HOSPITAL_COMMUNITY): Payer: Self-pay | Admitting: Emergency Medicine

## 2017-07-21 DIAGNOSIS — I2510 Atherosclerotic heart disease of native coronary artery without angina pectoris: Secondary | ICD-10-CM | POA: Insufficient documentation

## 2017-07-21 DIAGNOSIS — I5022 Chronic systolic (congestive) heart failure: Secondary | ICD-10-CM | POA: Insufficient documentation

## 2017-07-21 DIAGNOSIS — Z853 Personal history of malignant neoplasm of breast: Secondary | ICD-10-CM | POA: Diagnosis not present

## 2017-07-21 DIAGNOSIS — Z79899 Other long term (current) drug therapy: Secondary | ICD-10-CM | POA: Diagnosis not present

## 2017-07-21 DIAGNOSIS — Z7902 Long term (current) use of antithrombotics/antiplatelets: Secondary | ICD-10-CM | POA: Diagnosis not present

## 2017-07-21 DIAGNOSIS — N183 Chronic kidney disease, stage 3 (moderate): Secondary | ICD-10-CM | POA: Insufficient documentation

## 2017-07-21 DIAGNOSIS — Z87891 Personal history of nicotine dependence: Secondary | ICD-10-CM | POA: Insufficient documentation

## 2017-07-21 DIAGNOSIS — I130 Hypertensive heart and chronic kidney disease with heart failure and stage 1 through stage 4 chronic kidney disease, or unspecified chronic kidney disease: Secondary | ICD-10-CM | POA: Diagnosis not present

## 2017-07-21 DIAGNOSIS — R51 Headache: Secondary | ICD-10-CM | POA: Diagnosis present

## 2017-07-21 DIAGNOSIS — C44702 Unspecified malignant neoplasm of skin of right lower limb, including hip: Secondary | ICD-10-CM | POA: Diagnosis not present

## 2017-07-21 DIAGNOSIS — E1122 Type 2 diabetes mellitus with diabetic chronic kidney disease: Secondary | ICD-10-CM | POA: Diagnosis not present

## 2017-07-21 DIAGNOSIS — Z7982 Long term (current) use of aspirin: Secondary | ICD-10-CM | POA: Insufficient documentation

## 2017-07-21 DIAGNOSIS — G43009 Migraine without aura, not intractable, without status migrainosus: Secondary | ICD-10-CM | POA: Insufficient documentation

## 2017-07-21 DIAGNOSIS — Z955 Presence of coronary angioplasty implant and graft: Secondary | ICD-10-CM | POA: Insufficient documentation

## 2017-07-21 HISTORY — DX: Zoster without complications: B02.9

## 2017-07-21 HISTORY — DX: Unspecified osteoarthritis, unspecified site: M19.90

## 2017-07-21 MED ORDER — IBUPROFEN 600 MG PO TABS: 600.0000 mg | ORAL_TABLET | Freq: Four times a day (QID) | ORAL | 0 refills | Status: DC | PRN

## 2017-07-21 MED ORDER — DIPHENHYDRAMINE HCL 50 MG/ML IJ SOLN
25.0000 mg | Freq: Once | INTRAMUSCULAR | Status: AC
  Administered 2017-07-21: 25 mg via INTRAMUSCULAR
  Filled 2017-07-21: qty 1

## 2017-07-21 MED ORDER — PREDNISONE 50 MG PO TABS
60.0000 mg | ORAL_TABLET | Freq: Once | ORAL | Status: AC
  Administered 2017-07-21: 17:00:00 60 mg via ORAL
  Filled 2017-07-21: qty 1

## 2017-07-21 MED ORDER — PROCHLORPERAZINE EDISYLATE 5 MG/ML IJ SOLN
10.0000 mg | Freq: Once | INTRAMUSCULAR | Status: AC
  Administered 2017-07-21: 10 mg via INTRAMUSCULAR
  Filled 2017-07-21: qty 2

## 2017-07-21 NOTE — ED Provider Notes (Signed)
[facility] DEPT Provider Note   CSN: [id number] Arrival date & time: 07/21/17  1556     History   Chief Complaint No chief complaint on file.   HPI Kristen Garner is a 66 y.o. female with a history of migraine headache, chronic kidney disease, CAD, arthritis, DM, HTN and new diagnosis of right facial and neck shingles (improved since on acyclovir) presenting with migraine headache.  She describes right sided headache along with photophobia and mild nausea which is similar to prior migraines.  She is taking hydrocodone for her shingles but has not resolved her headache.  She denies fevers, chills, neck stiffness, vision changes or eye pain.  Also denies focal weakness, dizziness or other complaint.  The history is provided by the patient.    Past Medical History:  Diagnosis Date  . Abnormal chest CT    a. CTA 09/2016: ""1.4 cm aortopulmonary window node seen, of uncertain significance"  . Anxiety   . Arthritis   . Breast cancer ([place])    right  . Breast cancer ([place])   . CAD (coronary artery disease)    a. 09/2016: cath showing 3-v disease with severe stenosis along RCA, CTO of PL branch, mild LAD stenosis, and moderate D1 stenosis. S/p DES x2 to RCA on 10/14/2016.  Marland Kitchen Chronic systolic CHF (congestive heart failure) ([place])    a. 09/2016: Echo showing EF of 20-25% with diffuse HK  . CKD (chronic kidney disease), stage III   . Depression   . Diabetes mellitus    takes only the pill  . Headache(784.0)    occiasional migraine  . Hyperlipidemia   . Hypertension   . Hypokalemia   . Noncompliance 01/04/2015  . Osteopenia 01/04/2015  . PAT (paroxysmal atrial tachycardia) ([place])   . PVC's (premature ventricular contractions)   . Shingles   . Skin cancer 2015   right leg    Patient Active Problem List   Diagnosis Date Noted  . Atherosclerotic heart disease of native coronary artery with other forms of angina pectoris ([place]) 10/14/2016  . Atrial tachycardia ([place]) 10/14/2016    . Unstable angina ([place])   . Cardiomyopathy, ischemic   . Elevated troponin   . Acute on chronic combined systolic and diastolic CHF (congestive heart failure) (HCC)     Class: Acute  . SOB (shortness of breath) 10/10/2016  . Unifocal PVCs 10/10/2016  . GERD (gastroesophageal reflux disease) 10/10/2016  . Troponin level elevated 10/10/2016  . Osteopenia 01/04/2015  . Noncompliance 01/04/2015  . UTI (urinary tract infection) 04/05/2013  . Acute renal failure ([place]) 04/04/2013  . Diarrhea 04/04/2013  . Dehydration 04/04/2013  . Hypokalemia 04/04/2013  . Hyponatremia 04/04/2013  . Metabolic acidosis 96/29/5284  . Leucocytosis 04/04/2013  . Diabetes ([place]) 04/04/2013  . Essential hypertension 04/04/2013  . Infiltrating ductal carcinoma of breast ([place]) 10/05/2012  . HEARTBURN 08/23/2010  . ANXIETY DEPRESSION 07/15/2010  . ATRIAL TACHYCARDIA 07/15/2010    Past Surgical History:  Procedure Laterality Date  . ABDOMINAL HYSTERECTOMY    . BREAST BIOPSY Right    biopsy then wide excision   . CARDIAC CATHETERIZATION N/A 10/13/2016   Procedure: Left Heart Cath and Coronary Angiography;  Surgeon: Burnell Blanks, MD;  Location: [facility];  Service: Cardiovascular;  Laterality: N/A;  . CARDIAC CATHETERIZATION N/A 10/14/2016   Procedure: Coronary Stent Intervention;  Surgeon: Burnell Blanks, MD;  Location: [facility];  Service: Cardiovascular;  Laterality: N/A;  . CHOLECYSTECTOMY    .  CORONARY ANGIOPLASTY WITH STENT PLACEMENT  10/14/2016  . PARTIAL MASTECTOMY WITH AXILLARY SENTINEL LYMPH NODE BIOPSY    . SENTINEL LYMPH NODE BIOPSY    . SKIN CANCER EXCISION Right 2015    OB History    No data available       Home Medications    Prior to Admission medications   Medication Sig Start Date End Date Taking? Authorizing Provider  acyclovir (ZOVIRAX) 400 MG tablet Take 1 tablet (400 mg total) by mouth 5 (five) times daily. For 10 days 07/14/17   Triplett,  Tammy, PA-C  albuterol (PROVENTIL HFA;VENTOLIN HFA) 108 (90 Base) MCG/ACT inhaler Inhale 2 puffs into the lungs every 4 (four) hours as needed for wheezing or shortness of breath. 03/28/17   Orpah Greek, MD  Alogliptin Benzoate (NESINA) 6.25 MG TABS Take 1 tablet by mouth daily.    [provider]  ALPRAZolam Duanne Moron) 1 MG tablet Take 1 mg by mouth 2 (two) times daily.  09/16/16   [provider]  aspirin EC 81 MG EC tablet Take 1 tablet (81 mg total) by mouth daily. 10/16/16   Strader, Fransisco Hertz, PA-C  clopidogrel (PLAVIX) 75 MG tablet Take 1 tablet (75 mg total) by mouth daily. 11/12/16   Deboraha Sprang, MD  dicyclomine (BENTYL) 20 MG tablet Take one every 6 hours as needed for abdominal cramps 01/23/17   Milton Ferguson, MD  furosemide (LASIX) 40 MG tablet Take 0.5 tablets (20 mg total) by mouth daily. 11/12/16   Deboraha Sprang, MD  HYDROcodone-acetaminophen (NORCO/VICODIN) 5-325 MG tablet Take one tab po q 6 hrs prn pain 07/12/17   Triplett, Tammy, PA-C  ibuprofen (ADVIL,MOTRIN) 600 MG tablet Take 1 tablet (600 mg total) by mouth every 6 (six) hours as needed. 07/21/17   Evalee Jefferson, PA-C  levofloxacin (LEVAQUIN) 750 MG tablet Take 1 tablet (750 mg total) by mouth daily. Patient not taking: Reported on 07/06/2017 03/28/17   Orpah Greek, MD  methocarbamol (ROBAXIN) 500 MG tablet Take 1 tablet (500 mg total) by mouth 3 (three) times daily. 07/12/17   Triplett, Tammy, PA-C  metoprolol succinate (TOPROL-XL) 50 MG 24 hr tablet Take 1 tablet (50 mg total) by mouth 2 (two) times daily. Take with or immediately following a meal. 11/12/16 07/06/17  Deboraha Sprang, MD  mometasone (NASONEX) 50 MCG/ACT nasal spray Place 2 sprays into the nose daily. 03/28/17   Orpah Greek, MD  nitroGLYCERIN (NITROSTAT) 0.4 MG SL tablet Place 0.4 mg under the tongue every 5 (five) minutes as needed for chest pain.    [provider]  pantoprazole (PROTONIX) 40 MG tablet Take 1  tablet (40 mg total) by mouth daily. 11/12/16   Deboraha Sprang, MD  Potassium Chloride ER 20 MEQ TBCR Take 20 mEq by mouth daily. 11/12/16   Deboraha Sprang, MD  predniSONE (DELTASONE) 20 MG tablet Take 2 tablets (40 mg total) by mouth daily with breakfast. Patient not taking: Reported on 07/06/2017 03/28/17   Orpah Greek, MD  rosuvastatin (CRESTOR) 5 MG tablet Take 1 tablet (5 mg total) by mouth daily. 03/17/17 07/06/17  Herminio Commons, MD  zolpidem (AMBIEN) 10 MG tablet Take 10 mg by mouth at bedtime.    [provider]    Family History Family History  Problem Relation Age of Onset  . Diabetes Father   . Stroke Maternal Grandmother     Social History Social History  Substance Use Topics  . Smoking status:  Former Smoker  . Smokeless tobacco: Never Used  . Alcohol use No     Allergies   Codeine; Penicillins; Sulfonamide derivatives; and Iodine   Review of Systems Review of Systems  Constitutional: Negative for chills and fever.  HENT: Negative for congestion and sore throat.   Eyes: Positive for photophobia.  Respiratory: Negative for chest tightness and shortness of breath.   Cardiovascular: Negative for chest pain.  Gastrointestinal: Negative for abdominal pain and nausea.  Genitourinary: Negative.   Musculoskeletal: Negative for arthralgias, joint swelling and neck pain.  Skin: Positive for rash. Negative for wound.  Neurological: Positive for headaches. Negative for dizziness, weakness, light-headedness and numbness.  Psychiatric/Behavioral: Negative.      Physical Exam Updated Vital Signs BP (!) 131/108 (BP Location: Left Arm)   Pulse 79   Temp (!) 97 F (36.1 C) (Temporal)   Resp 18   Ht 5\' 5"  (1.651 m)   Wt 80.7 kg (178 lb)   SpO2 99%   BMI 29.62 kg/m   Physical Exam  Constitutional: She is oriented to person, place, and time. She appears well-developed and well-nourished.  HENT:  Head: Normocephalic and atraumatic.    Mouth/Throat: Oropharynx is clear and moist.  Eyes: Pupils are equal, round, and reactive to light. EOM are normal.  Neck: Normal range of motion. Neck supple.  Cardiovascular: Normal rate and normal heart sounds.   Pulmonary/Chest: Effort normal.  Abdominal: Soft. There is no tenderness.  Musculoskeletal: Normal range of motion.  Lymphadenopathy:    She has no cervical adenopathy.  Neurological: She is alert and oriented to person, place, and time. She has normal strength. No sensory deficit. Gait normal. GCS eye subscore is 4. GCS verbal subscore is 5. GCS motor subscore is 6.  Normal heel-shin, normal rapid alternating movements. Cranial nerves III-XII intact.  No pronator drift.  Skin: Skin is warm and dry. Rash noted.  Small area of scabbed shingles rash on right cheek and neck.  Psychiatric: She has a normal mood and affect. Her speech is normal and behavior is normal. Thought content normal. Cognition and memory are normal.  Nursing note and vitals reviewed.    ED Treatments / Results  Labs (all labs ordered are listed, but only abnormal results are displayed) Labs Reviewed - No data to display  EKG  EKG Interpretation None       Radiology No results found.  Procedures Procedures (including critical care time)  Medications Ordered in ED Medications  prochlorperazine (COMPAZINE) injection 10 mg (10 mg Intramuscular Given 07/21/17 1656)  diphenhydrAMINE (BENADRYL) injection 25 mg (25 mg Intramuscular Given 07/21/17 1654)  predniSONE (DELTASONE) tablet 60 mg (60 mg Oral Given 07/21/17 1654)     Initial Impression / Assessment and Plan / ED Course  I have reviewed the triage vital signs and the nursing notes.  Pertinent labs & imaging results that were available during my care of the patient were reviewed by me and considered in my medical decision making (see chart for details).     Pt given migraine cocktail with complete resolution of headache. Advised  completion of her acyclovir, f/u with pcp as planned.  Final Clinical Impressions(s) / ED Diagnoses   Final diagnoses:  Migraine without aura and without status migrainosus, not intractable    New Prescriptions New Prescriptions   IBUPROFEN (ADVIL,MOTRIN) 600 MG TABLET    Take 1 tablet (600 mg total) by mouth every 6 (six) hours as needed.     Evalee Jefferson, PA-C 07/21/17  2072    Julianne Rice, MD 07/22/17 [phone]

## 2017-07-21 NOTE — Discharge Instructions (Signed)
Finish your medicines you were prescribed for your shingles.  Take the hydrocodone if needed for pain relief.  Also, you may benefit from taking an anti inflammatory - I recommend ibuprofen 600 mg s prescribed if needed for additional pain relief.

## 2017-07-21 NOTE — ED Triage Notes (Signed)
Headache x 4 days, medication not helping at home

## 2017-07-23 ENCOUNTER — Other Ambulatory Visit: Payer: Self-pay

## 2017-07-23 NOTE — Patient Outreach (Addendum)
[facility]) Care Management  07/23/2017  Kristen Garner [birth year] [id number]   Telephone call to patient for ED Utilization.  No answer. HIPAA compliant voice message left.    Plan: RN Health Coach will attempt patient again within the next 10 business days.    Jone Baseman, RN, MSN [place] [phone]

## 2017-07-28 ENCOUNTER — Other Ambulatory Visit: Payer: Self-pay

## 2017-07-28 NOTE — Patient Outreach (Signed)
[facility]) Care Management  07/28/2017  CARALYNN GELBER [birth year] [id number]   2nd Telephone call to patient for ED Utilization Screen.  No answer.  HIPAA compliant voice message left.   Plan: RN Health Coach will attempt patient again within 10 business days.    Jone Baseman, RN, MSN [place] [phone]

## 2017-07-31 ENCOUNTER — Other Ambulatory Visit: Payer: Self-pay

## 2017-07-31 NOTE — Patient Outreach (Signed)
[name]) Care Management  07/31/2017  Kristen Garner 12-27-1950 [id number]   3rd telephone call to patient for ED Utilization screening.  No answer.  HIPAA complaint voice message left.    Plan: RN Health Coach will send letter to attempt outreach.  If no response within ten business days will proceed with case closure.    Jone Baseman, RN, MSN [place] [phone]

## 2017-08-14 ENCOUNTER — Other Ambulatory Visit: Payer: Self-pay

## 2017-08-14 NOTE — Patient Outreach (Signed)
[facility]) Care Management  08/14/2017  Kristen Garner 1951/11/05 [id number]   Patient has not responded to calls or letter. Will proceed with case closure.  Will notify care management assistant of case status.    Jone Baseman, RN, MSN [place] [phone]

## 2017-08-17 DIAGNOSIS — E663 Overweight: Secondary | ICD-10-CM | POA: Diagnosis not present

## 2017-08-17 DIAGNOSIS — Z23 Encounter for immunization: Secondary | ICD-10-CM | POA: Diagnosis not present

## 2017-08-17 DIAGNOSIS — G894 Chronic pain syndrome: Secondary | ICD-10-CM | POA: Diagnosis not present

## 2017-08-17 DIAGNOSIS — F419 Anxiety disorder, unspecified: Secondary | ICD-10-CM | POA: Diagnosis not present

## 2017-08-17 DIAGNOSIS — Z6828 Body mass index (BMI) 28.0-28.9, adult: Secondary | ICD-10-CM | POA: Diagnosis not present

## 2017-09-16 DIAGNOSIS — G894 Chronic pain syndrome: Secondary | ICD-10-CM | POA: Diagnosis not present

## 2017-09-16 DIAGNOSIS — Z6829 Body mass index (BMI) 29.0-29.9, adult: Secondary | ICD-10-CM | POA: Diagnosis not present

## 2017-09-16 DIAGNOSIS — F419 Anxiety disorder, unspecified: Secondary | ICD-10-CM | POA: Diagnosis not present

## 2017-09-16 DIAGNOSIS — E663 Overweight: Secondary | ICD-10-CM | POA: Diagnosis not present

## 2017-09-21 ENCOUNTER — Other Ambulatory Visit: Payer: Self-pay | Admitting: Student

## 2017-09-25 ENCOUNTER — Encounter (HOSPITAL_COMMUNITY): Payer: Self-pay | Admitting: Emergency Medicine

## 2017-09-25 ENCOUNTER — Inpatient Hospital Stay (HOSPITAL_COMMUNITY)
Admission: EM | Admit: 2017-09-25 | Discharge: 2017-10-01 | DRG: 291 | Disposition: A | Discharge status: Home / Self Care | Payer: PPO | Attending: Internal Medicine | Admitting: Internal Medicine

## 2017-09-25 ENCOUNTER — Emergency Department (HOSPITAL_COMMUNITY): Payer: PPO

## 2017-09-25 DIAGNOSIS — I130 Hypertensive heart and chronic kidney disease with heart failure and stage 1 through stage 4 chronic kidney disease, or unspecified chronic kidney disease: Secondary | ICD-10-CM | POA: Diagnosis not present

## 2017-09-25 DIAGNOSIS — Z853 Personal history of malignant neoplasm of breast: Secondary | ICD-10-CM | POA: Diagnosis not present

## 2017-09-25 DIAGNOSIS — Z8679 Personal history of other diseases of the circulatory system: Secondary | ICD-10-CM

## 2017-09-25 DIAGNOSIS — R0902 Hypoxemia: Secondary | ICD-10-CM | POA: Diagnosis not present

## 2017-09-25 DIAGNOSIS — Z833 Family history of diabetes mellitus: Secondary | ICD-10-CM | POA: Diagnosis not present

## 2017-09-25 DIAGNOSIS — M8580 Other specified disorders of bone density and structure, unspecified site: Secondary | ICD-10-CM | POA: Diagnosis present

## 2017-09-25 DIAGNOSIS — R000 Tachycardia, unspecified: Secondary | ICD-10-CM | POA: Diagnosis not present

## 2017-09-25 DIAGNOSIS — R0603 Acute respiratory distress: Secondary | ICD-10-CM

## 2017-09-25 DIAGNOSIS — Z880 Allergy status to penicillin: Secondary | ICD-10-CM

## 2017-09-25 DIAGNOSIS — I2510 Atherosclerotic heart disease of native coronary artery without angina pectoris: Secondary | ICD-10-CM | POA: Diagnosis not present

## 2017-09-25 DIAGNOSIS — Z7982 Long term (current) use of aspirin: Secondary | ICD-10-CM

## 2017-09-25 DIAGNOSIS — Z91048 Other nonmedicinal substance allergy status: Secondary | ICD-10-CM | POA: Diagnosis not present

## 2017-09-25 DIAGNOSIS — I471 Supraventricular tachycardia: Secondary | ICD-10-CM | POA: Diagnosis present

## 2017-09-25 DIAGNOSIS — I429 Cardiomyopathy, unspecified: Secondary | ICD-10-CM | POA: Diagnosis not present

## 2017-09-25 DIAGNOSIS — Z885 Allergy status to narcotic agent status: Secondary | ICD-10-CM

## 2017-09-25 DIAGNOSIS — I447 Left bundle-branch block, unspecified: Secondary | ICD-10-CM | POA: Diagnosis not present

## 2017-09-25 DIAGNOSIS — X58XXXA Exposure to other specified factors, initial encounter: Secondary | ICD-10-CM | POA: Diagnosis not present

## 2017-09-25 DIAGNOSIS — F419 Anxiety disorder, unspecified: Secondary | ICD-10-CM | POA: Diagnosis present

## 2017-09-25 DIAGNOSIS — T788XXA Other adverse effects, not elsewhere classified, initial encounter: Secondary | ICD-10-CM | POA: Diagnosis not present

## 2017-09-25 DIAGNOSIS — Z79899 Other long term (current) drug therapy: Secondary | ICD-10-CM

## 2017-09-25 DIAGNOSIS — N183 Chronic kidney disease, stage 3 (moderate): Secondary | ICD-10-CM | POA: Diagnosis not present

## 2017-09-25 DIAGNOSIS — I5043 Acute on chronic combined systolic (congestive) and diastolic (congestive) heart failure: Secondary | ICD-10-CM | POA: Diagnosis present

## 2017-09-25 DIAGNOSIS — Z9049 Acquired absence of other specified parts of digestive tract: Secondary | ICD-10-CM | POA: Diagnosis not present

## 2017-09-25 DIAGNOSIS — R0609 Other forms of dyspnea: Secondary | ICD-10-CM | POA: Diagnosis present

## 2017-09-25 DIAGNOSIS — Z882 Allergy status to sulfonamides status: Secondary | ICD-10-CM

## 2017-09-25 DIAGNOSIS — Z90710 Acquired absence of both cervix and uterus: Secondary | ICD-10-CM | POA: Diagnosis not present

## 2017-09-25 DIAGNOSIS — R21 Rash and other nonspecific skin eruption: Secondary | ICD-10-CM | POA: Diagnosis not present

## 2017-09-25 DIAGNOSIS — I4892 Unspecified atrial flutter: Secondary | ICD-10-CM | POA: Diagnosis present

## 2017-09-25 DIAGNOSIS — I472 Ventricular tachycardia: Secondary | ICD-10-CM | POA: Diagnosis present

## 2017-09-25 DIAGNOSIS — I5022 Chronic systolic (congestive) heart failure: Secondary | ICD-10-CM | POA: Diagnosis not present

## 2017-09-25 DIAGNOSIS — R0602 Shortness of breath: Secondary | ICD-10-CM | POA: Diagnosis not present

## 2017-09-25 DIAGNOSIS — Z87891 Personal history of nicotine dependence: Secondary | ICD-10-CM | POA: Diagnosis not present

## 2017-09-25 DIAGNOSIS — Z791 Long term (current) use of non-steroidal anti-inflammatories (NSAID): Secondary | ICD-10-CM

## 2017-09-25 DIAGNOSIS — K219 Gastro-esophageal reflux disease without esophagitis: Secondary | ICD-10-CM | POA: Diagnosis present

## 2017-09-25 DIAGNOSIS — Z85828 Personal history of other malignant neoplasm of skin: Secondary | ICD-10-CM | POA: Diagnosis not present

## 2017-09-25 DIAGNOSIS — I252 Old myocardial infarction: Secondary | ICD-10-CM | POA: Diagnosis not present

## 2017-09-25 DIAGNOSIS — Z823 Family history of stroke: Secondary | ICD-10-CM

## 2017-09-25 DIAGNOSIS — Z7902 Long term (current) use of antithrombotics/antiplatelets: Secondary | ICD-10-CM

## 2017-09-25 DIAGNOSIS — Z955 Presence of coronary angioplasty implant and graft: Secondary | ICD-10-CM

## 2017-09-25 DIAGNOSIS — I340 Nonrheumatic mitral (valve) insufficiency: Secondary | ICD-10-CM | POA: Diagnosis not present

## 2017-09-25 DIAGNOSIS — Z79891 Long term (current) use of opiate analgesic: Secondary | ICD-10-CM

## 2017-09-25 DIAGNOSIS — I25119 Atherosclerotic heart disease of native coronary artery with unspecified angina pectoris: Secondary | ICD-10-CM | POA: Diagnosis not present

## 2017-09-25 DIAGNOSIS — E782 Mixed hyperlipidemia: Secondary | ICD-10-CM | POA: Diagnosis present

## 2017-09-25 LAB — COMPREHENSIVE METABOLIC PANEL
ALK PHOS: 156 U/L — AB (ref 38–126)
ALT: 27 U/L (ref 14–54)
ANION GAP: 9 (ref 5–15)
AST: 29 U/L (ref 15–41)
Albumin: 4 g/dL (ref 3.5–5.0)
BILIRUBIN TOTAL: 0.5 mg/dL (ref 0.3–1.2)
BUN: 26 mg/dL — ABNORMAL HIGH (ref 6–20)
CALCIUM: 9 mg/dL (ref 8.9–10.3)
CO2: 27 mmol/L (ref 22–32)
CREATININE: 1.27 mg/dL — AB (ref 0.44–1.00)
Chloride: 102 mmol/L (ref 101–111)
GFR calc non Af Amer: 43 mL/min — ABNORMAL LOW (ref >60)
GFR, EST AFRICAN AMERICAN: 50 mL/min — AB (ref >60)
GLUCOSE: 184 mg/dL — AB (ref 65–99)
Potassium: 5.1 mmol/L (ref 3.5–5.1)
Sodium: 138 mmol/L (ref 135–145)
TOTAL PROTEIN: 7.1 g/dL (ref 6.5–8.1)

## 2017-09-25 LAB — CBC WITH DIFFERENTIAL/PLATELET
Basophils Absolute: 0.1 10*3/uL (ref 0.0–0.1)
Basophils Relative: 1 %
Eosinophils Absolute: 0.2 10*3/uL (ref 0.0–0.7)
Eosinophils Relative: 4 %
HEMATOCRIT: 39.7 % (ref 36.0–46.0)
HEMOGLOBIN: 12.8 g/dL (ref 12.0–15.0)
LYMPHS ABS: 1 10*3/uL (ref 0.7–4.0)
LYMPHS PCT: 17 %
MCH: 29.6 pg (ref 26.0–34.0)
MCHC: 32.2 g/dL (ref 30.0–36.0)
MCV: 91.9 fL (ref 78.0–100.0)
MONOS PCT: 5 %
Monocytes Absolute: 0.3 10*3/uL (ref 0.1–1.0)
NEUTROS ABS: 4.4 10*3/uL (ref 1.7–7.7)
NEUTROS PCT: 73 %
Platelets: 209 10*3/uL (ref 150–400)
RBC: 4.32 MIL/uL (ref 3.87–5.11)
RDW: 14.2 % (ref 11.5–15.5)
WBC: 6 10*3/uL (ref 4.0–10.5)

## 2017-09-25 LAB — GLUCOSE, CAPILLARY: Glucose-Capillary: 192 mg/dL — ABNORMAL HIGH (ref 65–99)

## 2017-09-25 LAB — TROPONIN I
Troponin I: <0.03 ng/mL (ref <0.03)
Troponin I: <0.03 ng/mL (ref <0.03)

## 2017-09-25 LAB — BRAIN NATRIURETIC PEPTIDE: B Natriuretic Peptide: 338 pg/mL — ABNORMAL HIGH (ref 0.0–100.0)

## 2017-09-25 MED ORDER — ALBUTEROL SULFATE (2.5 MG/3ML) 0.083% IN NEBU
5.0000 mg | INHALATION_SOLUTION | Freq: Once | RESPIRATORY_TRACT | Status: AC
  Administered 2017-09-25: 5 mg via RESPIRATORY_TRACT
  Filled 2017-09-25: qty 6

## 2017-09-25 MED ORDER — POLYETHYLENE GLYCOL 3350 17 G PO PACK
17.0000 g | PACK | Freq: Every day | ORAL | Status: DC | PRN
  Administered 2017-09-30: 17 g via ORAL
  Filled 2017-09-25: qty 1

## 2017-09-25 MED ORDER — METHOCARBAMOL 500 MG PO TABS
500.0000 mg | ORAL_TABLET | Freq: Three times a day (TID) | ORAL | Status: DC
  Administered 2017-09-25 – 2017-10-01 (×13): 500 mg via ORAL
  Filled 2017-09-25 (×14): qty 1

## 2017-09-25 MED ORDER — HYDROCODONE-ACETAMINOPHEN 5-325 MG PO TABS: 1.0000 | ORAL_TABLET | Freq: Four times a day (QID) | ORAL | Status: DC | PRN

## 2017-09-25 MED ORDER — METOPROLOL TARTRATE 5 MG/5ML IV SOLN
5.0000 mg | Freq: Once | INTRAVENOUS | Status: AC
  Administered 2017-09-25: 5 mg via INTRAVENOUS
  Filled 2017-09-25: qty 5

## 2017-09-25 MED ORDER — ONDANSETRON HCL 4 MG PO TABS: 4.0000 mg | ORAL_TABLET | Freq: Four times a day (QID) | ORAL | Status: DC | PRN

## 2017-09-25 MED ORDER — POTASSIUM CHLORIDE CRYS ER 10 MEQ PO TBCR
20.0000 meq | EXTENDED_RELEASE_TABLET | Freq: Every day | ORAL | Status: DC
  Filled 2017-09-25: qty 2

## 2017-09-25 MED ORDER — CLOPIDOGREL BISULFATE 75 MG PO TABS
75.0000 mg | ORAL_TABLET | Freq: Every day | ORAL | Status: DC
  Administered 2017-09-25 – 2017-10-01 (×7): 75 mg via ORAL
  Filled 2017-09-25 (×7): qty 1

## 2017-09-25 MED ORDER — LORATADINE 10 MG PO TABS
10.0000 mg | ORAL_TABLET | Freq: Every day | ORAL | Status: DC
  Administered 2017-09-25 – 2017-10-01 (×7): 10 mg via ORAL
  Filled 2017-09-25 (×7): qty 1

## 2017-09-25 MED ORDER — ALBUTEROL SULFATE HFA 108 (90 BASE) MCG/ACT IN AERS: 2.0000 | INHALATION_SPRAY | RESPIRATORY_TRACT | Status: DC | PRN

## 2017-09-25 MED ORDER — FUROSEMIDE 10 MG/ML IJ SOLN
40.0000 mg | Freq: Once | INTRAMUSCULAR | Status: AC
  Administered 2017-09-25: 40 mg via INTRAVENOUS
  Filled 2017-09-25: qty 4

## 2017-09-25 MED ORDER — POTASSIUM CHLORIDE 20 MEQ PO PACK
20.0000 meq | PACK | Freq: Every day | ORAL | Status: DC
  Administered 2017-09-25 – 2017-09-28 (×4): 20 meq via ORAL
  Filled 2017-09-25 (×4): qty 1

## 2017-09-25 MED ORDER — METOPROLOL SUCCINATE ER 50 MG PO TB24
50.0000 mg | ORAL_TABLET | Freq: Every day | ORAL | Status: DC
  Administered 2017-09-25: 50 mg via ORAL
  Filled 2017-09-25 (×4): qty 1

## 2017-09-25 MED ORDER — ZOLPIDEM TARTRATE 5 MG PO TABS
5.0000 mg | ORAL_TABLET | Freq: Every day | ORAL | Status: DC
  Administered 2017-09-25 – 2017-09-30 (×6): 5 mg via ORAL
  Filled 2017-09-25 (×6): qty 1

## 2017-09-25 MED ORDER — FUROSEMIDE 20 MG PO TABS
20.0000 mg | ORAL_TABLET | Freq: Every day | ORAL | Status: DC
  Administered 2017-09-26 – 2017-09-28 (×3): 20 mg via ORAL
  Filled 2017-09-25 (×3): qty 1

## 2017-09-25 MED ORDER — ALBUTEROL SULFATE (2.5 MG/3ML) 0.083% IN NEBU: 5.0000 mg | INHALATION_SOLUTION | RESPIRATORY_TRACT | Status: DC | PRN

## 2017-09-25 MED ORDER — METOPROLOL TARTRATE 25 MG PO TABS
25.0000 mg | ORAL_TABLET | Freq: Four times a day (QID) | ORAL | Status: DC
  Administered 2017-09-25 – 2017-09-27 (×7): 25 mg via ORAL
  Filled 2017-09-25 (×9): qty 1

## 2017-09-25 MED ORDER — PANTOPRAZOLE SODIUM 40 MG PO TBEC
40.0000 mg | DELAYED_RELEASE_TABLET | Freq: Every day | ORAL | Status: DC
  Administered 2017-09-25 – 2017-10-01 (×7): 40 mg via ORAL
  Filled 2017-09-25 (×7): qty 1

## 2017-09-25 MED ORDER — ACETAMINOPHEN 325 MG PO TABS: 650.0000 mg | ORAL_TABLET | Freq: Four times a day (QID) | ORAL | Status: DC | PRN

## 2017-09-25 MED ORDER — ROSUVASTATIN CALCIUM 10 MG PO TABS
5.0000 mg | ORAL_TABLET | Freq: Every day | ORAL | Status: DC
  Administered 2017-09-25 – 2017-09-30 (×6): 5 mg via ORAL
  Filled 2017-09-25 (×6): qty 1

## 2017-09-25 MED ORDER — ONDANSETRON HCL 4 MG/2ML IJ SOLN: 4.0000 mg | Freq: Four times a day (QID) | INTRAMUSCULAR | Status: DC | PRN

## 2017-09-25 MED ORDER — ALPRAZOLAM 1 MG PO TABS
1.0000 mg | ORAL_TABLET | Freq: Two times a day (BID) | ORAL | Status: DC
  Administered 2017-09-25 – 2017-10-01 (×12): 1 mg via ORAL
  Filled 2017-09-25 (×12): qty 1

## 2017-09-25 MED ORDER — ASPIRIN EC 81 MG PO TBEC
81.0000 mg | DELAYED_RELEASE_TABLET | Freq: Every day | ORAL | Status: DC
  Administered 2017-09-25 – 2017-10-01 (×7): 81 mg via ORAL
  Filled 2017-09-25 (×7): qty 1

## 2017-09-25 MED ORDER — ACETAMINOPHEN 650 MG RE SUPP: 650.0000 mg | Freq: Four times a day (QID) | RECTAL | Status: DC | PRN

## 2017-09-25 NOTE — Consult Note (Signed)
Cardiology Consultation:   Patient ID: YIZEL CANBY; [id number]; 12/03/1950   Admit date: 09/25/2017 Date of Consult: 09/25/2017  Primary Care Provider: Sharilyn Sites, MD Primary Cardiologist: Dr Bronson Ing Primary Electrophysiologist:  Dr Caryl Comes  Patient Profile:   Kristen Garner is a 66 y.o. female with a hx of CAD who is being seen today for the evaluation of chest pain at the request of Dr Adair Patter.  History of Present Illness:   Kristen Garner history of CAD with DES x 2 to RCA in 63/7858, chronic systolic HF LVEF 85-02 % by echo 10/2016, atach, NSVT. From last cardiology notes an echo was ordered to reevaluate LVEF s/p stenting and to see if ICD indicated, however dose not appear completed.  Regarding her history of atach seen by EP in hispital 10/2016. Initially thought to be aflutter, however on EP evaluation thought more consistent with atach. Anticoag was stopped  Patient presents with 1-2 days of increasing SOB. Denies any specific chest pain or palpitations. Reports some mild LE edema, chronic orthopnea unchanged. Reports medication compliance.    WBC 6, Hgb 12.8, Plt 209, Cr 1.27, BUN 26, K 5.1, BNP 338 Trop neg x 1 CXR: cardiomegaly with mild edema and small effusions similar to prior cxr EKG sinus tach vs atach LBBB old   Past Medical History:  Diagnosis Date  . Abnormal chest CT    a. CTA 09/2016: ""1.4 cm aortopulmonary window node seen, of uncertain significance"  . Anxiety   . Arthritis   . Breast cancer ([place])    right  . Breast cancer ([place])   . CAD (coronary artery disease)    a. 09/2016: cath showing 3-v disease with severe stenosis along RCA, CTO of PL Maxine Huynh, mild LAD stenosis, and moderate D1 stenosis. S/p DES x2 to RCA on 10/14/2016.  Marland Kitchen Chronic systolic CHF (congestive heart failure) ([place])    a. 09/2016: Echo showing EF of 20-25% with diffuse HK  . CKD (chronic kidney disease), stage III ([place])   . Depression   . Diabetes mellitus    takes  only the pill  . Headache(784.0)    occiasional migraine  . Hyperlipidemia   . Hypertension   . Hypokalemia   . Noncompliance 01/04/2015  . Osteopenia 01/04/2015  . PAT (paroxysmal atrial tachycardia) ([place])   . PVC's (premature ventricular contractions)   . Shingles   . Skin cancer 2015   right leg    Past Surgical History:  Procedure Laterality Date  . ABDOMINAL HYSTERECTOMY    . BREAST BIOPSY Right    biopsy then wide excision   . CARDIAC CATHETERIZATION N/A 10/13/2016   Procedure: Left Heart Cath and Coronary Angiography;  Surgeon: Burnell Blanks, MD;  Location: [facility];  Service: Cardiovascular;  Laterality: N/A;  . CARDIAC CATHETERIZATION N/A 10/14/2016   Procedure: Coronary Stent Intervention;  Surgeon: Burnell Blanks, MD;  Location: [facility];  Service: Cardiovascular;  Laterality: N/A;  . CHOLECYSTECTOMY    . CORONARY ANGIOPLASTY WITH STENT PLACEMENT  10/14/2016  . PARTIAL MASTECTOMY WITH AXILLARY SENTINEL LYMPH NODE BIOPSY    . SENTINEL LYMPH NODE BIOPSY    . SKIN CANCER EXCISION Right 2015      Inpatient Medications: Scheduled Meds: . metoprolol succinate  50 mg Oral Daily   Continuous Infusions:  PRN Meds:   Allergies:    Allergies  Allergen Reactions  . Codeine Itching  . Penicillins Other (See Comments)    Child hood allergy Has  patient had a PCN reaction causing immediate rash, facial/tongue/throat swelling, SOB or lightheadedness with hypotension: Unknown Has patient had a PCN reaction causing severe rash involving mucus membranes or skin necrosis: Unknown Has patient had a PCN reaction that required hospitalization: No Has patient had a PCN reaction occurring within the last 10 years: No If all of the above answers are "NO", then may proceed with Cephalosporin use.   . Sulfonamide Derivatives Itching  . Iodine     Patient states that she got a cat scratch and her mom put iodine on it and then got catch scratch  fever    Social History:   Social History   Social History  . Marital status: Widowed    Spouse name: N/A  . Number of children: N/A  . Years of education: N/A   Occupational History  . Not on file.   Social History Main Topics  . Smoking status: Former Research scientist (life sciences)  . Smokeless tobacco: Never Used  . Alcohol use No  . Drug use: No  . Sexual activity: Yes    Birth control/ protection: Surgical   Other Topics Concern  . Not on file   Social History Narrative  . No narrative on file    Family History:    Family History  Problem Relation Age of Onset  . Diabetes Father   . Stroke Maternal Grandmother      ROS:  Please see the history of present illness.  ROS  All other ROS reviewed and negative.     Physical Exam/Data:   Vitals:   09/25/17 1300 09/25/17 1330 09/25/17 1400 09/25/17 1415  BP: 137/85 123/79 (!) 144/92   Pulse:      Resp: 17 15 (!) 25 (!) 25  Temp:      TempSrc:      SpO2: 98% 95%  95%  Weight:      Height:       No intake or output data in the 24 hours ending 09/25/17 1445 Filed Weights   09/25/17 0948  Weight: 178 lb (80.7 kg)   Body mass index is 28.73 kg/m.  General:  Well nourished, well developed, in no acute distress HEENT: normal Lymph: no adenopathy Neck: no JVD Endocrine:  No thryomegaly Vascular: No carotid bruits; FA pulses 2+ bilaterally without bruits  Cardiac:  Tachycardic, regular, no mr/g, no jvd Lungs:  clear to auscultation bilaterally, no wheezing, rhonchi or rales  Abd: soft, nontender, no hepatomegaly  Ext: no edema Musculoskeletal:  No deformities, BUE and BLE strength normal and equal Skin: warm and dry  Neuro:  CNs 2-12 intact, no focal abnormalities noted Psych:  Normal affect   EKG:  The EKG was personally reviewed and demonstrates: atach with LBBB Telemetry:  Telemetry was personally reviewed and demonstrates:  atach with LBBB   Laboratory Data:  Chemistry Recent Labs Lab 09/25/17 0951  NA 138  K  5.1  CL 102  CO2 27  GLUCOSE 184*  BUN 26*  CREATININE 1.27*  CALCIUM 9.0  GFRNONAA 43*  GFRAA 50*  ANIONGAP 9     Recent Labs Lab 09/25/17 0951  PROT 7.1  ALBUMIN 4.0  AST 29  ALT 27  ALKPHOS 156*  BILITOT 0.5   Hematology Recent Labs Lab 09/25/17 0951  WBC 6.0  RBC 4.32  HGB 12.8  HCT 39.7  MCV 91.9  MCH 29.6  MCHC 32.2  RDW 14.2  PLT 209   Cardiac Enzymes Recent Labs Lab 09/25/17 0951  TROPONINI <0.03  No results for input(s): TROPIPOC in the last 168 hours.  BNP Recent Labs Lab 09/25/17 1018  BNP 338.0*    DDimer No results for input(s): DDIMER in the last 168 hours.  Radiology/Studies:  Dg Chest 2 View  Result Date: 09/25/2017 CLINICAL DATA:  Shortness of breath for 2 days. EXAM: CHEST  2 VIEW COMPARISON:  03/28/2017 FINDINGS: Cardiomegaly with vascular congestion. Interstitial prominence and increased markings in the lung bases, most likely edema. Findings are similar prior study. Small bilateral effusions. No acute bony abnormality. IMPRESSION: Cardiomegaly with mild interstitial edema and small effusions. Findings similar to prior study. Electronically Signed   By: Rolm Baptise M.D.   On: 09/25/2017 11:09    Assessment and Plan:   1. Tachycardia -patient tachycardic 130s with her underlying LBBB, rhythm likely is recurrence of her atach which she has had prior, though cannot completely exclude aflutter - likely etiology of her SOB, will need rate control. I have written for IV lopressor 5mg  once IV access is obtained. Will try up to 3 doses of IV lopressor, then titration of short acting oral initially as needed. Would avoid diltiazem given her low LVEF. If arrhythmia proves refractory may need to consider amiodarone. - at this time likely atach, would not start anticoag.   2. Chronic systolic HF - will need repeat echo once rates better controlled - perhaps some mild volume overload, likely exacerbated by her arrhythmia. Dose lasix 40mg  IV x  1 in ER. -hold long acting beta blocker, after IV lopressor will start lopressor for ability to titrate given her tachcyardia.  - has not been on ACE/ARB/aldactone due to her borerline renal function   3. CAD - no current evidence of active ischemia. Would trend enzymes overnight, EKG with chronic LBBB    For questions or updates, please contact [facility] Please consult [email or web address] for contact info under Cardiology/STEMI.   Merrily Pew, MD  09/25/2017 2:45 PM

## 2017-09-25 NOTE — ED Notes (Signed)
hospitalist at pts bedside.

## 2017-09-25 NOTE — H&P (Signed)
History and Physical    Kristen Garner OHY:[id number] DOB: Jul 24, 1951 DOA: 09/25/2017  PCP: Sharilyn Sites, MD  Patient coming from: Home  Chief Complaint: Dyspnea  HPI: Kristen Garner is a 66 y.o. female with medical history significant of anxiety, CAD, CKD Stage III, DM, Chronic systolic CHF that presents for shortness of breath that began last evening.  Reports initially felt as though she had sinus drainage and shortness of breath, similarly to how she felt when she had her heart attack a year ago.  She reports that yesterday morning she felt the same and took her metoprolol and felt somewhat normal throughout the day but then began feeling symptoms again last night.  She then began to get worried again because of her heart attack symptoms last year that required three stents.  Patient takes all her medications as prescribed but did not take her medicine this morning.  Currently she feels short of breath is having a hard time speaking in full sentences. Denies chest pain, chest pressure, denies sweating, arm pain, nausea, vomiting.  Reports that when she had her NSTEMI she did not have chest pain at that time either.  2 sick contacts of sinus congestion type symptoms.    ED Course: Patient was seen and examined by EDP.  Blood work obtained showed troponin of <0.03, EKG was essentially unchanged from previously.  Cardiology consulted given patient's history of dyspnea in setting of NSTEMI.  TRH asked to admit to cycle cardiac enzymes.  Review of Systems: As per HPI otherwise 10 point review of systems negative.   Past Medical History:  Diagnosis Date  . Abnormal chest CT    a. CTA 09/2016: ""1.4 cm aortopulmonary window node seen, of uncertain significance"  . Anxiety   . Arthritis   . Breast cancer ([place])    right  . Breast cancer ([place])   . CAD (coronary artery disease)    a. 09/2016: cath showing 3-v disease with severe stenosis along RCA, CTO of PL branch, mild LAD stenosis, and  moderate D1 stenosis. S/p DES x2 to RCA on 10/14/2016.  Marland Kitchen Chronic systolic CHF (congestive heart failure) ([place])    a. 09/2016: Echo showing EF of 20-25% with diffuse HK  . CKD (chronic kidney disease), stage III ([place])   . Depression   . Diabetes mellitus    takes only the pill  . Headache(784.0)    occiasional migraine  . Hyperlipidemia   . Hypertension   . Hypokalemia   . Noncompliance 01/04/2015  . Osteopenia 01/04/2015  . PAT (paroxysmal atrial tachycardia) ([place])   . PVC's (premature ventricular contractions)   . Shingles   . Skin cancer 2015   right leg    Past Surgical History:  Procedure Laterality Date  . ABDOMINAL HYSTERECTOMY    . BREAST BIOPSY Right    biopsy then wide excision   . CARDIAC CATHETERIZATION N/A 10/13/2016   Procedure: Left Heart Cath and Coronary Angiography;  Surgeon: Burnell Blanks, MD;  Location: [facility];  Service: Cardiovascular;  Laterality: N/A;  . CARDIAC CATHETERIZATION N/A 10/14/2016   Procedure: Coronary Stent Intervention;  Surgeon: Burnell Blanks, MD;  Location: [facility];  Service: Cardiovascular;  Laterality: N/A;  . CHOLECYSTECTOMY    . CORONARY ANGIOPLASTY WITH STENT PLACEMENT  10/14/2016  . PARTIAL MASTECTOMY WITH AXILLARY SENTINEL LYMPH NODE BIOPSY    . SENTINEL LYMPH NODE BIOPSY    . SKIN CANCER EXCISION Right 2015  reports that she has quit smoking. She has never used smokeless tobacco. She reports that she does not drink alcohol or use drugs. Smoked years ago (25 years ago).   Allergies  Allergen Reactions  . Codeine Itching  . Penicillins Other (See Comments)    Child hood allergy Has patient had a PCN reaction causing immediate rash, facial/tongue/throat swelling, SOB or lightheadedness with hypotension: Unknown Has patient had a PCN reaction causing severe rash involving mucus membranes or skin necrosis: Unknown Has patient had a PCN reaction that required hospitalization: No Has  patient had a PCN reaction occurring within the last 10 years: No If all of the above answers are "NO", then may proceed with Cephalosporin use.   . Sulfonamide Derivatives Itching  . Iodine     Patient states that she got a cat scratch and her mom put iodine on it and then got catch scratch fever    Family History  Problem Relation Age of Onset  . Diabetes Father   . Stroke Maternal Grandmother     Prior to Admission medications   Medication Sig Start Date End Date Taking? Authorizing Provider  albuterol (PROVENTIL HFA;VENTOLIN HFA) 108 (90 Base) MCG/ACT inhaler Inhale 2 puffs into the lungs every 4 (four) hours as needed for wheezing or shortness of breath. 03/28/17  Yes Pollina, Gwenyth Allegra, MD  ALPRAZolam Duanne Moron) 1 MG tablet Take 1 mg by mouth 2 (two) times daily.  09/16/16  Yes [provider]  aspirin EC 81 MG EC tablet Take 1 tablet (81 mg total) by mouth daily. 10/16/16  Yes Strader, Fransisco Hertz, PA-C  clopidogrel (PLAVIX) 75 MG tablet TAKE 1 TABLET BY MOUTH EVERY DAY 09/22/17  Yes Herminio Commons, MD  dicyclomine (BENTYL) 20 MG tablet Take one every 6 hours as needed for abdominal cramps 01/23/17  Yes Milton Ferguson, MD  furosemide (LASIX) 40 MG tablet Take 0.5 tablets (20 mg total) by mouth daily. 11/12/16  Yes Deboraha Sprang, MD  HYDROcodone-acetaminophen (NORCO/VICODIN) 5-325 MG tablet Take one tab po q 6 hrs prn pain Patient taking differently: Take 1-2 tablets by mouth every 6 (six) hours as needed for moderate pain. Take one tab po q 6 hrs prn pain 07/12/17  Yes Triplett, Tammy, PA-C  ibuprofen (ADVIL,MOTRIN) 600 MG tablet Take 1 tablet (600 mg total) by mouth every 6 (six) hours as needed. 07/21/17  Yes Idol, Almyra Free, PA-C  methocarbamol (ROBAXIN) 500 MG tablet Take 1 tablet (500 mg total) by mouth 3 (three) times daily. 07/12/17  Yes Triplett, Tammy, PA-C  metoprolol succinate (TOPROL-XL) 50 MG 24 hr tablet Take 1 tablet (50 mg total) by mouth 2 (two) times daily.  Take with or immediately following a meal. 11/12/16 09/25/17 Yes Deboraha Sprang, MD  mometasone (NASONEX) 50 MCG/ACT nasal spray Place 2 sprays into the nose daily. 03/28/17  Yes Pollina, Gwenyth Allegra, MD  nitroGLYCERIN (NITROSTAT) 0.4 MG SL tablet Place 0.4 mg under the tongue every 5 (five) minutes as needed for chest pain.   Yes [provider]  pantoprazole (PROTONIX) 40 MG tablet Take 1 tablet (40 mg total) by mouth daily. 11/12/16  Yes Deboraha Sprang, MD  Potassium Chloride ER 20 MEQ TBCR Take 20 mEq by mouth daily. 11/12/16  Yes Deboraha Sprang, MD  rosuvastatin (CRESTOR) 5 MG tablet Take 1 tablet (5 mg total) by mouth daily. 03/17/17 09/25/17 Yes Herminio Commons, MD  zolpidem (AMBIEN) 10 MG tablet Take 10 mg by mouth at bedtime.  Yes [provider]  acyclovir (ZOVIRAX) 400 MG tablet Take 1 tablet (400 mg total) by mouth 5 (five) times daily. For 10 days Patient not taking: Reported on 09/25/2017 07/14/17   Kem Parkinson, PA-C  levofloxacin (LEVAQUIN) 750 MG tablet Take 1 tablet (750 mg total) by mouth daily. Patient not taking: Reported on 07/06/2017 03/28/17   Orpah Greek, MD  predniSONE (DELTASONE) 20 MG tablet Take 2 tablets (40 mg total) by mouth daily with breakfast. Patient not taking: Reported on 07/06/2017 03/28/17   Orpah Greek, MD    Physical Exam: Vitals:   09/25/17 1400 09/25/17 1415 09/25/17 1606 09/25/17 1645  BP: (!) 144/92  (!) 142/95   Pulse:   (!) 120   Resp: (!) 25 (!) 25 19   Temp:   98 F (36.7 C)   TempSrc:   Oral   SpO2:  95% 92%   Weight:    81.7 kg (180 lb 3.2 oz)  Height:   5\' 6"  (1.676 m)       Constitutional: moderate distress, speaking in phrases Vitals:   09/25/17 1400 09/25/17 1415 09/25/17 1606 09/25/17 1645  BP: (!) 144/92  (!) 142/95   Pulse:   (!) 120   Resp: (!) 25 (!) 25 19   Temp:   98 F (36.7 C)   TempSrc:   Oral   SpO2:  95% 92%   Weight:    81.7 kg (180 lb 3.2 oz)  Height:   5\' 6"  (1.676  m)    Eyes: PERRL, lids and conjunctivae normal ENMT: Mucous membranes are moist. Posterior pharynx clear of any exudate or lesions. Poor dentition- dentures in place  Neck: normal, supple, no masses, no thyromegaly Respiratory: clear to auscultation bilaterally, no wheezing, no crackles. Normal respiratory effort. No accessory muscle use.  Cardiovascular: tachycardic, no murmurs / rubs / gallops. No extremity edema. 2+ pedal pulses. No carotid bruits.  Abdomen: no tenderness, no masses palpated. No hepatosplenomegaly. Bowel sounds positive.  Musculoskeletal: no clubbing / cyanosis. No joint deformity upper and lower extremities. Good ROM, no contractures. Normal muscle tone.  Skin: dry skin on lower extremities bilaterally, erythema of feet bilaterally (per patient this is chronic) Neurologic: CN 2-12 grossly intact. Sensation intact, DTR normal. Strength 5/5 in all 4.  Psychiatric: Normal judgment and insight. Alert and oriented x 3. Normal mood.    Labs on Admission: I have personally reviewed following labs and imaging studies  CBC:  Recent Labs Lab 09/25/17 0951  WBC 6.0  NEUTROABS 4.4  HGB 12.8  HCT 39.7  MCV 91.9  PLT 161   Basic Metabolic Panel:  Recent Labs Lab 09/25/17 0951  NA 138  K 5.1  CL 102  CO2 27  GLUCOSE 184*  BUN 26*  CREATININE 1.27*  CALCIUM 9.0   GFR: Estimated Creatinine Clearance: 47 mL/min (A) (by C-G formula based on SCr of 1.27 mg/dL (H)). Liver Function Tests:  Recent Labs Lab 09/25/17 0951  AST 29  ALT 27  ALKPHOS 156*  BILITOT 0.5  PROT 7.1  ALBUMIN 4.0   No results for input(s): LIPASE, AMYLASE in the last 168 hours. No results for input(s): AMMONIA in the last 168 hours. Coagulation Profile: No results for input(s): INR, PROTIME in the last 168 hours. Cardiac Enzymes:  Recent Labs Lab 09/25/17 0951 09/25/17 1426  TROPONINI <0.03 <0.03   BNP (last 3 results) No results for input(s): PROBNP in the last 8760  hours. HbA1C: No results for input(s):  HGBA1C in the last 72 hours. CBG: No results for input(s): GLUCAP in the last 168 hours. Lipid Profile: No results for input(s): CHOL, HDL, LDLCALC, TRIG, CHOLHDL, LDLDIRECT in the last 72 hours. Thyroid Function Tests: No results for input(s): TSH, T4TOTAL, FREET4, T3FREE, THYROIDAB in the last 72 hours. Anemia Panel: No results for input(s): VITAMINB12, FOLATE, FERRITIN, TIBC, IRON, RETICCTPCT in the last 72 hours. Urine analysis:    Component Value Date/Time   COLORURINE YELLOW 10/10/2016 1730   APPEARANCEUR CLEAR 10/10/2016 1730   LABSPEC 1.020 10/10/2016 1730   PHURINE 5.5 10/10/2016 1730   GLUCOSEU NEGATIVE 10/10/2016 1730   HGBUR NEGATIVE 10/10/2016 1730   BILIRUBINUR NEGATIVE 10/10/2016 1730   KETONESUR NEGATIVE 10/10/2016 1730   PROTEINUR NEGATIVE 10/10/2016 1730   UROBILINOGEN 0.2 04/04/2013 2238   NITRITE NEGATIVE 10/10/2016 1730   LEUKOCYTESUR SMALL (A) 10/10/2016 1730   Sepsis Labs: !!!!!!!!!!!!!!!!!!!!!!!!!!!!!!!!!!!!!!!!!!!! @LABRCNTIP (procalcitonin:4,lacticidven:4) )No results found for this or any previous visit (from the past 240 hour(s)).   Radiological Exams on Admission: Dg Chest 2 View  Result Date: 09/25/2017 CLINICAL DATA:  Shortness of breath for 2 days. EXAM: CHEST  2 VIEW COMPARISON:  03/28/2017 FINDINGS: Cardiomegaly with vascular congestion. Interstitial prominence and increased markings in the lung bases, most likely edema. Findings are similar prior study. Small bilateral effusions. No acute bony abnormality. IMPRESSION: Cardiomegaly with mild interstitial edema and small effusions. Findings similar to prior study. Electronically Signed   By: Rolm Baptise M.D.   On: 09/25/2017 11:09    EKG: Independently reviewed. LBBB with possibly atrial tachycardia  Assessment/Plan Principal Problem:   Dyspnea Active Problems:   H/O atrial tachycardia   GERD (gastroesophageal reflux disease)   Acute on chronic  combined systolic and diastolic CHF (congestive heart failure) (HCC)   Atrial tachycardia (HCC)   Dyspnea - cycle cardiac enzymes - supplemental oxygen as needed - albuterol neb if needed - on plavix  Acute on chronic combined systolic and diastolic CHF - given 1 dose of IV lasix in ED per cardiology - I/O - repeat BMP in am - daily weights  H/o atrial tachycardia - given IV metoprolol up to 3 times - will then transition to PO short acting - avoid diltiazem given low EF - per cardiology may need to consider amiodarone if metoprolol unsuccessful  GERD - PPI  CAD s/p 3 stents - continue plavix and statin   DVT prophylaxis: heparin  Code Status: Full Code  Family Communication: patient requests that I call her sister  Disposition Plan: pending cardiology evaluation  Consults called: cardiology ([place]) called by EDP  Admission status: Obs, telemetry    Loretha Stapler MD [facility] Pager [phone]  If 7PM-7AM, please contact night-coverage [email or web address] Password [facility]  09/25/2017, 5:23 PM

## 2017-09-25 NOTE — ED Triage Notes (Signed)
Patient c/o shortness of breath x2 days. Patient does report nonproductive cough and sinus drainage. Denies any fevers. Per patient has asthma and using inhaler with no relief. Patient denies chest pain but reports hx of stent placement in which patient states "this is how I felt when I had to get my stent." Patient reports using SL nitro x1 with no improvement.

## 2017-09-25 NOTE — Progress Notes (Signed)
Patient says she does not take robaxin except as needed. Refused her 6 PM dose.

## 2017-09-25 NOTE — ED Provider Notes (Signed)
Emergency Department Provider Note   I have reviewed the triage vital signs and the nursing notes.   HISTORY  Chief Complaint Shortness of Breath   HPI Kristen Garner is a 66 y.o. female with PMH of breast cancer, CAD, CHF, CKD, DM, HLD, HTN shortness of breath over the past 2 days.  Symptoms seem worse in the morning.  Patient woke up this morning and had significant trouble breathing.  She used her inhaler which did not help.  She has had some sinus drainage and nonproductive cough but does not seem to be getting worse.  She reports last time she had similar presentation she required stents in her heart.  She denies any chest pain or palpitations.  No syncope.  No fevers or chills.  No radiation of symptoms.  Breathing is worse with movement.  She feels worse when she is lying flat and better sitting up.    Past Medical History:  Diagnosis Date  . Abnormal chest CT    a. CTA 09/2016: ""1.4 cm aortopulmonary window node seen, of uncertain significance"  . Anxiety   . Arthritis   . Breast cancer ([place])    right  . Breast cancer ([place])   . CAD (coronary artery disease)    a. 09/2016: cath showing 3-v disease with severe stenosis along RCA, CTO of PL branch, mild LAD stenosis, and moderate D1 stenosis. S/p DES x2 to RCA on 10/14/2016.  Marland Kitchen Chronic systolic CHF (congestive heart failure) ([place])    a. 09/2016: Echo showing EF of 20-25% with diffuse HK  . CKD (chronic kidney disease), stage III ([place])   . Depression   . Diabetes mellitus    takes only the pill  . Headache(784.0)    occiasional migraine  . Hyperlipidemia   . Hypertension   . Hypokalemia   . Noncompliance 01/04/2015  . Osteopenia 01/04/2015  . PAT (paroxysmal atrial tachycardia) ([place])   . PVC's (premature ventricular contractions)   . Shingles   . Skin cancer 2015   right leg    Patient Active Problem List   Diagnosis Date Noted  . Dyspnea 09/25/2017  . Atherosclerotic heart disease of native coronary artery  with other forms of angina pectoris ([place]) 10/14/2016  . Atrial tachycardia ([place]) 10/14/2016  . Unstable angina ([place])   . Cardiomyopathy, ischemic   . Elevated troponin   . Acute on chronic combined systolic and diastolic CHF (congestive heart failure) (H[facility])     Class: Acute  . SOB (shortness of breath) 10/10/2016  . Unifocal PVCs 10/10/2016  . GERD (gastroesophageal reflux disease) 10/10/2016  . Troponin level elevated 10/10/2016  . Osteopenia 01/04/2015  . Noncompliance 01/04/2015  . UTI (urinary tract infection) 04/05/2013  . Acute renal failure ([place]) 04/04/2013  . Diarrhea 04/04/2013  . Dehydration 04/04/2013  . Hypokalemia 04/04/2013  . Hyponatremia 04/04/2013  . Metabolic acidosis 72/07/4708  . Leucocytosis 04/04/2013  . Diabetes ([place]) 04/04/2013  . Essential hypertension 04/04/2013  . Infiltrating ductal carcinoma of breast ([place]) 10/05/2012  . HEARTBURN 08/23/2010  . ANXIETY DEPRESSION 07/15/2010  . ATRIAL TACHYCARDIA 07/15/2010    Past Surgical History:  Procedure Laterality Date  . ABDOMINAL HYSTERECTOMY    . BREAST BIOPSY Right    biopsy then wide excision   . CARDIAC CATHETERIZATION N/A 10/13/2016   Procedure: Left Heart Cath and Coronary Angiography;  Surgeon: Burnell Blanks, MD;  Location: [facility];  Service: Cardiovascular;  Laterality: N/A;  . CARDIAC CATHETERIZATION N/A 10/14/2016  Procedure: Coronary Stent Intervention;  Surgeon: Burnell Blanks, MD;  Location: [facility];  Service: Cardiovascular;  Laterality: N/A;  . CHOLECYSTECTOMY    . CORONARY ANGIOPLASTY WITH STENT PLACEMENT  10/14/2016  . PARTIAL MASTECTOMY WITH AXILLARY SENTINEL LYMPH NODE BIOPSY    . SENTINEL LYMPH NODE BIOPSY    . SKIN CANCER EXCISION Right 2015    Current Outpatient Rx  . Order #: [id number] Class: Print  . Order #: [id number] Class: Historical Med  . Order #: [id number] Class: OTC  . Order #: [id number] Class: Normal  . Order #: [id number] Class:  Print  . Order #: [id number] Class: Normal  . Order #: [id number] Class: Print  . Order #: [id number] Class: Print  . Order #: [id number] Class: Print  . Order #: [id number] Class: Normal  . Order #: [id number] Class: Print  . Order #: [id number] Class: Historical Med  . Order #: [id number] Class: Normal  . Order #: [id number] Class: Normal  . Order #: [id number] Class: Normal  . Order #: [id number] Class: Historical Med  . Order #: [id number] Class: Print  . Order #: [id number] Class: Print  . Order #: [id number] Class: Print    Allergies Codeine; Penicillins; Sulfonamide derivatives; and Iodine  Family History  Problem Relation Age of Onset  . Diabetes Father   . Stroke Maternal Grandmother     Social History Social History  Substance Use Topics  . Smoking status: Former Research scientist (life sciences)  . Smokeless tobacco: Never Used  . Alcohol use No    Review of Systems  Constitutional: No fever/chills Eyes: No visual changes. ENT: No sore throat. Cardiovascular: Denies chest pain. Respiratory: Positive shortness of breath. Gastrointestinal: No abdominal pain.  No nausea, no vomiting.  No diarrhea.  No constipation. Genitourinary: Negative for dysuria. Musculoskeletal: Negative for back pain. Skin: Negative for rash. Neurological: Negative for headaches, focal weakness or numbness.  10-point ROS otherwise negative.  ____________________________________________   PHYSICAL EXAM:  VITAL SIGNS: ED Triage Vitals  Enc Vitals Group     BP 09/25/17 0947 132/90     Pulse Rate 09/25/17 0947 (!) 123     Resp 09/25/17 0947 (!) 24     Temp 09/25/17 0947 97.7 F (36.5 C)     Temp Source 09/25/17 0947 Oral     SpO2 09/25/17 0947 95 %     Weight 09/25/17 0948 178 lb (80.7 kg)     Height 09/25/17 0948 5\' 6"  (1.676 m)   Constitutional: Alert and oriented. Well appearing and in no acute distress. Eyes: Conjunctivae are normal.  Head: Atraumatic. Nose: No congestion/rhinnorhea. Mouth/Throat: Mucous membranes are  dry.  Neck: No stridor.  Cardiovascular: Tachycardia. Good peripheral circulation. Grossly normal heart sounds.   Respiratory: Normal respiratory effort.  No retractions. Lungs CTAB. Gastrointestinal: Soft and nontender. No distention.  Musculoskeletal: No lower extremity tenderness nor edema. No gross deformities of extremities. Neurologic:  Normal speech and language. No gross focal neurologic deficits are appreciated.  Skin:  Skin is warm, dry and intact. No rash noted.  ____________________________________________   LABS (all labs ordered are listed, but only abnormal results are displayed)  Labs Reviewed  COMPREHENSIVE METABOLIC PANEL - Abnormal; Notable for the following:       Result Value   Glucose, Bld 184 (*)    BUN 26 (*)    Creatinine, Ser 1.27 (*)    Alkaline Phosphatase 156 (*)    GFR calc non Af Amer 43 (*)    GFR calc Af Amer 50 (*)    All other components within normal limits  BRAIN NATRIURETIC PEPTIDE -  Abnormal; Notable for the following:    B Natriuretic Peptide 338.0 (*)    All other components within normal limits  TROPONIN I  CBC WITH DIFFERENTIAL/PLATELET  TROPONIN I   ____________________________________________  EKG   EKG Interpretation  Date/Time:  Friday September 25 2017 09:41:42 EDT Ventricular Rate:  123 PR Interval:  162 QRS Duration: 128 QT Interval:  328 QTC Calculation: 469 R Axis:   104 Text Interpretation:  Sinus tachycardia Rightward axis Non-specific intra-ventricular conduction block Cannot rule out Septal infarct , age undetermined T wave abnormality, consider inferior ischemia Abnormal ECG No STEMI.  Confirmed by Nanda Quinton [phone]) on 09/25/2017 9:56:21 AM       ____________________________________________  RADIOLOGY  Dg Chest 2 View  Result Date: 09/25/2017 CLINICAL DATA:  Shortness of breath for 2 days. EXAM: CHEST  2 VIEW COMPARISON:  03/28/2017 FINDINGS: Cardiomegaly with vascular congestion. Interstitial prominence  and increased markings in the lung bases, most likely edema. Findings are similar prior study. Small bilateral effusions. No acute bony abnormality. IMPRESSION: Cardiomegaly with mild interstitial edema and small effusions. Findings similar to prior study. Electronically Signed   By: Rolm Baptise M.D.   On: 09/25/2017 11:09    ____________________________________________   PROCEDURES  Procedure(s) performed:   Procedures  None ____________________________________________   INITIAL IMPRESSION / ASSESSMENT AND PLAN / ED COURSE  Pertinent labs & imaging results that were available during my care of the patient were reviewed by me and considered in my medical decision making (see chart for details).  Patient presents with 2 days of shortness of breath worse in the morning with nonproductive cough.  No fevers.  Seems slightly volume up.  She has crackles at the bases bilaterally.  She is tachycardic and mildly tachypneic with no hypoxemia.  Plan for plain films, troponin, BNP.  Lower suspicion for PE in this case of the patient is tachycardic will reassess after x-ray.   Spoke with Dr. Harl Bowie with Cardiology who will come by and consult on the patient in the ED.   Discussed patient's case with Hospitalist, Dr. Adair Patter to request admission. Patient and family (if present) updated with plan. Care transferred to Hospitalist service.  I reviewed all nursing notes, vitals, pertinent old records, EKGs, labs, imaging (as available).  ____________________________________________  FINAL CLINICAL IMPRESSION(S) / ED DIAGNOSES  Final diagnoses:  Shortness of breath  Hypoxia     MEDICATIONS GIVEN DURING THIS VISIT:  Medications  metoprolol succinate (TOPROL-XL) 24 hr tablet 50 mg (50 mg Oral Given 09/25/17 1430)  albuterol (PROVENTIL) (2.5 MG/3ML) 0.083% nebulizer solution 5 mg (5 mg Nebulization Given 09/25/17 1027)     NEW OUTPATIENT MEDICATIONS STARTED DURING THIS  VISIT:  None   Note:  This document was prepared using Dragon voice recognition software and may include unintentional dictation errors.  Nanda Quinton, MD Emergency Medicine    Long, [facility], MD 09/25/17 [phone]

## 2017-09-25 NOTE — ED Notes (Signed)
Attempted IV times 2 without success.

## 2017-09-26 LAB — BASIC METABOLIC PANEL
ANION GAP: 8 (ref 5–15)
BUN: 28 mg/dL — ABNORMAL HIGH (ref 6–20)
CHLORIDE: 102 mmol/L (ref 101–111)
CO2: 28 mmol/L (ref 22–32)
Calcium: 8.8 mg/dL — ABNORMAL LOW (ref 8.9–10.3)
Creatinine, Ser: 1.44 mg/dL — ABNORMAL HIGH (ref 0.44–1.00)
GFR calc non Af Amer: 37 mL/min — ABNORMAL LOW (ref >60)
GFR, EST AFRICAN AMERICAN: 43 mL/min — AB (ref >60)
Glucose, Bld: 125 mg/dL — ABNORMAL HIGH (ref 65–99)
POTASSIUM: 4.7 mmol/L (ref 3.5–5.1)
Sodium: 138 mmol/L (ref 135–145)

## 2017-09-26 LAB — CBC
HEMATOCRIT: 39 % (ref 36.0–46.0)
HEMOGLOBIN: 12.6 g/dL (ref 12.0–15.0)
MCH: 29.2 pg (ref 26.0–34.0)
MCHC: 32.3 g/dL (ref 30.0–36.0)
MCV: 90.5 fL (ref 78.0–100.0)
Platelets: 204 10*3/uL (ref 150–400)
RBC: 4.31 MIL/uL (ref 3.87–5.11)
RDW: 14.2 % (ref 11.5–15.5)
WBC: 4.9 10*3/uL (ref 4.0–10.5)

## 2017-09-26 LAB — GLUCOSE, CAPILLARY
GLUCOSE-CAPILLARY: 141 mg/dL — AB (ref 65–99)
Glucose-Capillary: 109 mg/dL — ABNORMAL HIGH (ref 65–99)
Glucose-Capillary: 123 mg/dL — ABNORMAL HIGH (ref 65–99)

## 2017-09-26 LAB — TROPONIN I
Troponin I: <0.03 ng/mL (ref <0.03)
Troponin I: <0.03 ng/mL (ref <0.03)

## 2017-09-26 NOTE — Progress Notes (Signed)
PROGRESS NOTE    Kristen Garner  UJW:[id number] DOB: 1951/01/07 DOA: 09/25/2017 PCP: Sharilyn Sites, MD    Brief Narrative:  Kristen Garner is a 66 y.o. female with medical history significant of anxiety, CAD, CKD Stage III, DM, Chronic systolic CHF that presents for shortness of breath that began last evening.  Reports initially felt as though she had sinus drainage and shortness of breath, similarly to how she felt when she had her heart attack a year ago.  She reports that yesterday morning she felt the same and took her metoprolol and felt somewhat normal throughout the day but then began feeling symptoms again last night.  She then began to get worried again because of her heart attack symptoms last year that required three stents.  Patient takes all her medications as prescribed but did not take her medicine this morning.  Currently she feels short of breath is having a hard time speaking in full sentences. Denies chest pain, chest pressure, denies sweating, arm pain, nausea, vomiting.  Reports that when she had her NSTEMI she did not have chest pain at that time either.  2 sick contacts of sinus congestion type symptoms.    ED Course: Patient was seen and examined by EDP.  Blood work obtained showed troponin of <0.03, EKG was essentially unchanged from previously.  Cardiology consulted given patient's history of dyspnea in setting of NSTEMI.  TRH asked to admit to cycle cardiac enzymes.  Patient was admitted and cardiology evaluated patient. She was started on PO metoprolol and heart rate was monitored.   Assessment & Plan:   Principal Problem:   Dyspnea Active Problems:   H/O atrial tachycardia   GERD (gastroesophageal reflux disease)   Acute on chronic combined systolic and diastolic CHF (congestive heart failure) (HCC)   Atrial tachycardia (HCC)   Dyspnea - cycle cardiac enzymes - supplemental oxygen as needed - albuterol neb if needed - on plavix - echo  ordered  Acute on chronic combined systolic and diastolic CHF - given 1 dose of IV lasix in ED per cardiology - I/O (not being recorded) - slight bump in Cr this am - echo ordered  H/o atrial tachycardia - short acting PO metoprolol - will then transition to PO short acting - avoid diltiazem given low EF - patient did not receive dose this am which could contribute to her tachycardia now - will continue to monitor heart rate with consistent metoprolol  GERD - PPI  CAD s/p 3 stents - continue plavix and statin   DVT prophylaxis: heparin  Code Status: Full Code  Family Communication: no family bedside Disposition Plan: pending cardiology evaluation    Consultants:   Cardiology  Procedures:   none  Antimicrobials:   none    Subjective: Patient is laying in bed.  Reports she feels much better today than she did yesterday.  Anxious to get home.  Objective: Vitals:   09/25/17 2322 09/26/17 0535 09/26/17 1305 09/26/17 1408  BP: 111/72 (!) 114/57 (!) 116/57   Pulse: (!) 124 73 (!) 115   Resp:  16 18   Temp:  97.9 F (36.6 C) 98.7 F (37.1 C)   TempSrc:  Oral Oral   SpO2:  96% 97% 94%  Weight:      Height:        Intake/Output Summary (Last 24 hours) at 09/26/17 1652 Last data filed at 09/26/17 1305  Gross per 24 hour  Intake  600 ml  Output                0 ml  Net              600 ml   Filed Weights   09/25/17 0948 09/25/17 1645  Weight: 80.7 kg (178 lb) 81.7 kg (180 lb 3.2 oz)    Examination:  General exam: Appears calm and comfortable  Respiratory system: few scattered expiratory wheezes Cardiovascular system: S1 & S2 heard, RRR. No JVD, murmurs, rubs, gallops or clicks. No pedal edema. Gastrointestinal system: Abdomen is nondistended, soft and nontender. No organomegaly or masses felt. Normal bowel sounds heard. Central nervous system: Alert and oriented. No focal neurological deficits. Extremities: Symmetric 5 x 5  power. Skin: dry skin of lower extremities bilaterally Psychiatry: Judgement and insight appear normal. Mood & affect appropriate.     Data Reviewed: I have personally reviewed following labs and imaging studies  CBC:  Recent Labs Lab 09/25/17 0951 09/26/17 0644  WBC 6.0 4.9  NEUTROABS 4.4  --   HGB 12.8 12.6  HCT 39.7 39.0  MCV 91.9 90.5  PLT 209 505   Basic Metabolic Panel:  Recent Labs Lab 09/25/17 0951 09/26/17 0644  NA 138 138  K 5.1 4.7  CL 102 102  CO2 27 28  GLUCOSE 184* 125*  BUN 26* 28*  CREATININE 1.27* 1.44*  CALCIUM 9.0 8.8*   GFR: Estimated Creatinine Clearance: 41.4 mL/min (A) (by C-G formula based on SCr of 1.44 mg/dL (H)). Liver Function Tests:  Recent Labs Lab 09/25/17 0951  AST 29  ALT 27  ALKPHOS 156*  BILITOT 0.5  PROT 7.1  ALBUMIN 4.0   No results for input(s): LIPASE, AMYLASE in the last 168 hours. No results for input(s): AMMONIA in the last 168 hours. Coagulation Profile: No results for input(s): INR, PROTIME in the last 168 hours. Cardiac Enzymes:  Recent Labs Lab 09/25/17 0951 09/25/17 1426 09/25/17 1836 09/25/17 2347 09/26/17 0644  TROPONINI <0.03 <0.03 <0.03 <0.03 <0.03   BNP (last 3 results) No results for input(s): PROBNP in the last 8760 hours. HbA1C: No results for input(s): HGBA1C in the last 72 hours. CBG:  Recent Labs Lab 09/25/17 2002 09/26/17 0823 09/26/17 1208 09/26/17 1634  GLUCAP 192* 109* 123* 141*   Lipid Profile: No results for input(s): CHOL, HDL, LDLCALC, TRIG, CHOLHDL, LDLDIRECT in the last 72 hours. Thyroid Function Tests: No results for input(s): TSH, T4TOTAL, FREET4, T3FREE, THYROIDAB in the last 72 hours. Anemia Panel: No results for input(s): VITAMINB12, FOLATE, FERRITIN, TIBC, IRON, RETICCTPCT in the last 72 hours. Sepsis Labs: No results for input(s): PROCALCITON, LATICACIDVEN in the last 168 hours.  No results found for this or any previous visit (from the past 240 hour(s)).        Radiology Studies: Dg Chest 2 View  Result Date: 09/25/2017 CLINICAL DATA:  Shortness of breath for 2 days. EXAM: CHEST  2 VIEW COMPARISON:  03/28/2017 FINDINGS: Cardiomegaly with vascular congestion. Interstitial prominence and increased markings in the lung bases, most likely edema. Findings are similar prior study. Small bilateral effusions. No acute bony abnormality. IMPRESSION: Cardiomegaly with mild interstitial edema and small effusions. Findings similar to prior study. Electronically Signed   By: Rolm Baptise M.D.   On: 09/25/2017 11:09        Scheduled Meds: . ALPRAZolam  1 mg Oral BID  . aspirin EC  81 mg Oral Daily  . clopidogrel  75 mg Oral Daily  .  furosemide  20 mg Oral Daily  . loratadine  10 mg Oral Daily  . methocarbamol  500 mg Oral TID  . metoprolol tartrate  25 mg Oral Q6H  . pantoprazole  40 mg Oral Daily  . potassium chloride  20 mEq Oral Daily  . rosuvastatin  5 mg Oral q1800  . zolpidem  5 mg Oral QHS   Continuous Infusions:   LOS: 0 days    Time spent: 30 minutes    Loretha Stapler, MD [facility] Pager [phone]  If 7PM-7AM, please contact night-coverage [email or web address] Password [email or web address] 09/26/2017, 4:52 PM

## 2017-09-27 ENCOUNTER — Observation Stay (HOSPITAL_BASED_OUTPATIENT_CLINIC_OR_DEPARTMENT_OTHER): Payer: PPO

## 2017-09-27 DIAGNOSIS — I340 Nonrheumatic mitral (valve) insufficiency: Secondary | ICD-10-CM

## 2017-09-27 LAB — ECHOCARDIOGRAM COMPLETE
HEIGHTINCHES: 66 in
WEIGHTICAEL: 2883.2 [oz_av]

## 2017-09-27 MED ORDER — METOPROLOL TARTRATE 25 MG PO TABS
37.5000 mg | ORAL_TABLET | Freq: Four times a day (QID) | ORAL | Status: DC
  Administered 2017-09-27 – 2017-09-28 (×3): 37.5 mg via ORAL
  Filled 2017-09-27 (×2): qty 2

## 2017-09-27 NOTE — Progress Notes (Signed)
  Echocardiogram 2D Echocardiogram has been performed.  Kristen Garner 09/27/2017, 12:02 PM

## 2017-09-27 NOTE — Progress Notes (Signed)
PROGRESS NOTE    ISHI DANSER  EVO:[id number] DOB: 1951-05-11 DOA: 09/25/2017 PCP: Sharilyn Sites, MD    Brief Narrative:  HELGA ASBURY is a 66 y.o. female with medical history significant of anxiety, CAD, CKD Stage III, DM, Chronic systolic CHF that presents for shortness of breath that began last evening.  Reports initially felt as though she had sinus drainage and shortness of breath, similarly to how she felt when she had her heart attack a year ago.  She reports that yesterday morning she felt the same and took her metoprolol and felt somewhat normal throughout the day but then began feeling symptoms again last night.  She then began to get worried again because of her heart attack symptoms last year that required three stents.  Patient takes all her medications as prescribed but did not take her medicine this morning.  Currently she feels short of breath is having a hard time speaking in full sentences. Denies chest pain, chest pressure, denies sweating, arm pain, nausea, vomiting.  Reports that when she had her NSTEMI she did not have chest pain at that time either.  2 sick contacts of sinus congestion type symptoms.    ED Course: Patient was seen and examined by EDP.  Blood work obtained showed troponin of <0.03, EKG was essentially unchanged from previously.  Cardiology consulted given patient's history of dyspnea in setting of NSTEMI.  TRH asked to admit to cycle cardiac enzymes.  Patient was admitted and cardiology evaluated patient. She was started on PO metoprolol and heart rate was monitored.   Assessment & Plan:   Principal Problem:   Dyspnea Active Problems:   H/O atrial tachycardia   GERD (gastroesophageal reflux disease)   Acute on chronic combined systolic and diastolic CHF (congestive heart failure) (HCC)   Atrial tachycardia (HCC)   Dyspnea - troponins negative x 3 - supplemental oxygen as needed - albuterol neb if needed - on plavix - echo stable from  previous (Left ventricle: The cavity size was mildly dilated. There was   mild concentric hypertrophy. Systolic function was severely   reduced. The estimated ejection fraction was in the range of 20%   to 25%. Diffuse hypokinesis. Features are consistent with a   pseudonormal left ventricular filling pattern, with concomitant   abnormal relaxation and increased filling pressure (grade 2   diastolic dysfunction). Doppler parameters are consistent with   elevated ventricular end-diastolic filling pressure.)  Acute on chronic combined systolic and diastolic CHF - given 1 dose of IV lasix in ED per cardiology - I/O (not being recorded) - repeat BMP in am - echo results above  H/o atrial tachycardia - short acting PO metoprolol - increase metoprolol to 37.5mg  q6h given tachycardia - avoid diltiazem given low EF - will continue to monitor heart rate with consistent metoprolol  GERD - PPI  CAD s/p 3 stents - continue plavix and statin   DVT prophylaxis: heparin  Code Status: Full Code  Family Communication: no family bedside Disposition Plan: pending improvement in tachycardia   Consultants:   Cardiology  Procedures:   none  Antimicrobials:   none    Subjective: Patient says she feels better than she did when she was admitted.  Tachycardic most of yesterday when she was just laying in bed asleep.  Objective: Vitals:   09/26/17 2028 09/26/17 2210 09/27/17 0614 09/27/17 1446  BP:  113/78 129/62 120/79  Pulse:  (!) 123 86 (!) 115  Resp:  18 18 16  Temp:  98.2 F (36.8 C) 98.1 F (36.7 C) 97.7 F (36.5 C)  TempSrc:  Oral Oral Oral  SpO2: 91% 95% 96% 98%  Weight:      Height:        Intake/Output Summary (Last 24 hours) at 09/27/2017 1636 Last data filed at 09/27/2017 1000 Gross per 24 hour  Intake 240 ml  Output 1100 ml  Net -860 ml   Filed Weights   09/25/17 0948 09/25/17 1645  Weight: 80.7 kg (178 lb) 81.7 kg (180 lb 3.2 oz)     Examination:  General exam: Appears calm and comfortable  Respiratory system: good air movement, no accessory muscle use, occasional end expiratory wheeze Cardiovascular system: S1 & S2 heard, tachycardic. No JVD, murmurs, rubs, gallops or clicks. No pedal edema. Gastrointestinal system: Abdomen is nondistended, soft and nontender. No organomegaly or masses felt. Normal bowel sounds heard. Central nervous system: Alert and oriented. No focal neurological deficits. Extremities: Symmetric 5 x 5 power. Skin: dry skin of lower extremities bilaterally Psychiatry: Judgement and insight appear normal. Mood & affect appropriate.     Data Reviewed: I have personally reviewed following labs and imaging studies  CBC: Recent Labs  Lab 09/25/17 0951 09/26/17 0644  WBC 6.0 4.9  NEUTROABS 4.4  --   HGB 12.8 12.6  HCT 39.7 39.0  MCV 91.9 90.5  PLT 209 623   Basic Metabolic Panel: Recent Labs  Lab 09/25/17 0951 09/26/17 0644  NA 138 138  K 5.1 4.7  CL 102 102  CO2 27 28  GLUCOSE 184* 125*  BUN 26* 28*  CREATININE 1.27* 1.44*  CALCIUM 9.0 8.8*   GFR: Estimated Creatinine Clearance: 41.4 mL/min (A) (by C-G formula based on SCr of 1.44 mg/dL (H)). Liver Function Tests: Recent Labs  Lab 09/25/17 0951  AST 29  ALT 27  ALKPHOS 156*  BILITOT 0.5  PROT 7.1  ALBUMIN 4.0   No results for input(s): LIPASE, AMYLASE in the last 168 hours. No results for input(s): AMMONIA in the last 168 hours. Coagulation Profile: No results for input(s): INR, PROTIME in the last 168 hours. Cardiac Enzymes: Recent Labs  Lab 09/25/17 0951 09/25/17 1426 09/25/17 1836 09/25/17 2347 09/26/17 0644  TROPONINI <0.03 <0.03 <0.03 <0.03 <0.03   BNP (last 3 results) No results for input(s): PROBNP in the last 8760 hours. HbA1C: No results for input(s): HGBA1C in the last 72 hours. CBG: Recent Labs  Lab 09/25/17 2002 09/26/17 0823 09/26/17 1208 09/26/17 1634  GLUCAP 192* 109* 123* 141*    Lipid Profile: No results for input(s): CHOL, HDL, LDLCALC, TRIG, CHOLHDL, LDLDIRECT in the last 72 hours. Thyroid Function Tests: No results for input(s): TSH, T4TOTAL, FREET4, T3FREE, THYROIDAB in the last 72 hours. Anemia Panel: No results for input(s): VITAMINB12, FOLATE, FERRITIN, TIBC, IRON, RETICCTPCT in the last 72 hours. Sepsis Labs: No results for input(s): PROCALCITON, LATICACIDVEN in the last 168 hours.  No results found for this or any previous visit (from the past 240 hour(s)).       Radiology Studies: No results found.      Scheduled Meds: . ALPRAZolam  1 mg Oral BID  . aspirin EC  81 mg Oral Daily  . clopidogrel  75 mg Oral Daily  . furosemide  20 mg Oral Daily  . loratadine  10 mg Oral Daily  . methocarbamol  500 mg Oral TID  . metoprolol tartrate  37.5 mg Oral Q6H  . pantoprazole  40 mg Oral Daily  .  potassium chloride  20 mEq Oral Daily  . rosuvastatin  5 mg Oral q1800  . zolpidem  5 mg Oral QHS   Continuous Infusions:   LOS: 0 days    Time spent: 25 minutes    Loretha Stapler, MD [facility] Pager [phone]  If 7PM-7AM, please contact night-coverage [email or web address] Password [facility] 09/27/2017, 4:36 PM

## 2017-09-28 DIAGNOSIS — F419 Anxiety disorder, unspecified: Secondary | ICD-10-CM | POA: Diagnosis present

## 2017-09-28 DIAGNOSIS — R0602 Shortness of breath: Secondary | ICD-10-CM | POA: Diagnosis present

## 2017-09-28 DIAGNOSIS — Z87891 Personal history of nicotine dependence: Secondary | ICD-10-CM | POA: Diagnosis not present

## 2017-09-28 DIAGNOSIS — I2510 Atherosclerotic heart disease of native coronary artery without angina pectoris: Secondary | ICD-10-CM | POA: Diagnosis present

## 2017-09-28 DIAGNOSIS — I429 Cardiomyopathy, unspecified: Secondary | ICD-10-CM

## 2017-09-28 DIAGNOSIS — K219 Gastro-esophageal reflux disease without esophagitis: Secondary | ICD-10-CM | POA: Diagnosis present

## 2017-09-28 DIAGNOSIS — R000 Tachycardia, unspecified: Secondary | ICD-10-CM | POA: Diagnosis present

## 2017-09-28 DIAGNOSIS — X58XXXA Exposure to other specified factors, initial encounter: Secondary | ICD-10-CM | POA: Diagnosis not present

## 2017-09-28 DIAGNOSIS — Z833 Family history of diabetes mellitus: Secondary | ICD-10-CM | POA: Diagnosis not present

## 2017-09-28 DIAGNOSIS — I252 Old myocardial infarction: Secondary | ICD-10-CM | POA: Diagnosis not present

## 2017-09-28 DIAGNOSIS — I472 Ventricular tachycardia: Secondary | ICD-10-CM | POA: Diagnosis present

## 2017-09-28 DIAGNOSIS — Z85828 Personal history of other malignant neoplasm of skin: Secondary | ICD-10-CM | POA: Diagnosis not present

## 2017-09-28 DIAGNOSIS — Z882 Allergy status to sulfonamides status: Secondary | ICD-10-CM | POA: Diagnosis not present

## 2017-09-28 DIAGNOSIS — I5043 Acute on chronic combined systolic (congestive) and diastolic (congestive) heart failure: Secondary | ICD-10-CM | POA: Diagnosis present

## 2017-09-28 DIAGNOSIS — I130 Hypertensive heart and chronic kidney disease with heart failure and stage 1 through stage 4 chronic kidney disease, or unspecified chronic kidney disease: Secondary | ICD-10-CM | POA: Diagnosis present

## 2017-09-28 DIAGNOSIS — Z9049 Acquired absence of other specified parts of digestive tract: Secondary | ICD-10-CM | POA: Diagnosis not present

## 2017-09-28 DIAGNOSIS — I25119 Atherosclerotic heart disease of native coronary artery with unspecified angina pectoris: Secondary | ICD-10-CM

## 2017-09-28 DIAGNOSIS — N183 Chronic kidney disease, stage 3 (moderate): Secondary | ICD-10-CM | POA: Diagnosis present

## 2017-09-28 DIAGNOSIS — Z885 Allergy status to narcotic agent status: Secondary | ICD-10-CM | POA: Diagnosis not present

## 2017-09-28 DIAGNOSIS — Z90710 Acquired absence of both cervix and uterus: Secondary | ICD-10-CM | POA: Diagnosis not present

## 2017-09-28 DIAGNOSIS — Z823 Family history of stroke: Secondary | ICD-10-CM | POA: Diagnosis not present

## 2017-09-28 DIAGNOSIS — I4892 Unspecified atrial flutter: Secondary | ICD-10-CM | POA: Diagnosis present

## 2017-09-28 DIAGNOSIS — Z853 Personal history of malignant neoplasm of breast: Secondary | ICD-10-CM | POA: Diagnosis not present

## 2017-09-28 DIAGNOSIS — Z880 Allergy status to penicillin: Secondary | ICD-10-CM | POA: Diagnosis not present

## 2017-09-28 DIAGNOSIS — M8580 Other specified disorders of bone density and structure, unspecified site: Secondary | ICD-10-CM | POA: Diagnosis present

## 2017-09-28 DIAGNOSIS — I447 Left bundle-branch block, unspecified: Secondary | ICD-10-CM | POA: Diagnosis present

## 2017-09-28 DIAGNOSIS — Z955 Presence of coronary angioplasty implant and graft: Secondary | ICD-10-CM | POA: Diagnosis not present

## 2017-09-28 DIAGNOSIS — Z91048 Other nonmedicinal substance allergy status: Secondary | ICD-10-CM | POA: Diagnosis not present

## 2017-09-28 DIAGNOSIS — I471 Supraventricular tachycardia: Secondary | ICD-10-CM | POA: Diagnosis present

## 2017-09-28 MED ORDER — METOPROLOL TARTRATE 50 MG PO TABS
50.0000 mg | ORAL_TABLET | Freq: Four times a day (QID) | ORAL | Status: DC
  Administered 2017-09-28 – 2017-09-29 (×4): 50 mg via ORAL
  Filled 2017-09-28 (×4): qty 1

## 2017-09-28 MED ORDER — AMIODARONE HCL 200 MG PO TABS
200.0000 mg | ORAL_TABLET | Freq: Two times a day (BID) | ORAL | Status: DC
  Administered 2017-09-28 – 2017-10-01 (×7): 200 mg via ORAL
  Filled 2017-09-28 (×7): qty 1

## 2017-09-28 MED ORDER — DIPHENHYDRAMINE-ZINC ACETATE 2-0.1 % EX CREA
TOPICAL_CREAM | Freq: Three times a day (TID) | CUTANEOUS | Status: DC | PRN
  Administered 2017-09-28: 1 via TOPICAL
  Filled 2017-09-28: qty 28

## 2017-09-28 NOTE — Progress Notes (Signed)
Patient complains of itching, "stickers" removed from 12 lead EKG, noted redness/rash  from where stickers were.MD notified.

## 2017-09-28 NOTE — Progress Notes (Signed)
Notified about LBBB on EKG today by nursing staff. This is not acute and has been present, noted on 09/2017 EKG. Longer prior history of incomplete LBBB. Amiodarone started today, I do not see an indication to change therapy at this time   Carlyle Dolly MD

## 2017-09-28 NOTE — Care Management Note (Addendum)
Case Management Note  Patient Details  Name: Kristen Garner MRN: [id number] Date of Birth: 05-09-1951  Subjective/Objective:       Admitted with SOB. Pt from home, lives with friend and his son. Pt is ind with ADL's. See's her PCP monthly drives herself to appointments. She has insurance with drug coverage. She manages her own medications appropriately. She communicates no needs or concerns about transition home.              Action/Plan: DC home, possibly today, No CM needs noted at this time. Pt aware she will receive Emmi transition calls at DC. CM will put flyer with DC info.   Expected Discharge Date:       09/28/2017           Expected Discharge Plan:  Home/Self Care  In-House Referral:  NA  Discharge planning Services  CM Consult  Post Acute Care Choice:  NA Choice offered to:  NA  Status of Service:  Completed, signed off  Sherald Barge, RN 09/28/2017, 10:41 AM

## 2017-09-28 NOTE — Progress Notes (Signed)
Patient EKG resulted left bundle branch block, reported to Dr. Harl Bowie.

## 2017-09-28 NOTE — Progress Notes (Signed)
PROGRESS NOTE    Kristen Garner  KPT:[id number] DOB: 1951-03-20 DOA: 09/25/2017 PCP: Sharilyn Sites, MD    Brief Narrative:  Kristen Garner is a 66 y.o. female with medical history significant of anxiety, CAD, CKD Stage III, DM, Chronic systolic CHF that presents for shortness of breath that began last evening.  Reports initially felt as though she had sinus drainage and shortness of breath, similarly to how she felt when she had her heart attack a year ago.  She reports that yesterday morning she felt the same and took her metoprolol and felt somewhat normal throughout the day but then began feeling symptoms again last night.  She then began to get worried again because of her heart attack symptoms last year that required three stents.  Patient takes all her medications as prescribed but did not take her medicine this morning.  Currently she feels short of breath is having a hard time speaking in full sentences. Denies chest pain, chest pressure, denies sweating, arm pain, nausea, vomiting.  Reports that when she had her NSTEMI she did not have chest pain at that time either.  2 sick contacts of sinus congestion type symptoms.    ED Course: Patient was seen and examined by EDP.  Blood work obtained showed troponin of <0.03, EKG was essentially unchanged from previously.  Cardiology consulted given patient's history of dyspnea in setting of NSTEMI.  TRH asked to admit to cycle cardiac enzymes.  Patient was admitted and cardiology evaluated patient. She was started on PO metoprolol and heart rate was monitored. Metoprolol increased on 11/4 and then again on 11/5.  Heart rate continued to be tachycardic.   Assessment & Plan:   Principal Problem:   Dyspnea Active Problems:   H/O atrial tachycardia   GERD (gastroesophageal reflux disease)   Acute on chronic combined systolic and diastolic CHF (congestive heart failure) (HCC)   Atrial tachycardia (HCC)   Dyspnea - troponins negative x  3 - supplemental oxygen as needed - albuterol neb if needed - on plavix - echo stable from previous (Left ventricle: The cavity size was mildly dilated. There was mild concentric hypertrophy. Systolic function was severely reduced. The estimated ejection fraction was in the range of 20% to 25%. Diffuse hypokinesis. Features are consistent with a pseudonormal left ventricular filling pattern, with concomitant abnormal relaxation and increased filling pressure (grade 2 diastolic dysfunction). Doppler parameters are consistent with   elevated ventricular end-diastolic filling pressure.)  Acute on chronic combined systolic and diastolic CHF - given 1 dose of IV lasix in ED per cardiology - furosemide 20mg  PO daily - echo results above  Allergic reaction to EKG stickers - benadryl cream  H/o atrial tachycardia - short acting PO metoprolol - short acting metoprolol increased on 11/4 and again on 11/5 - starting amiodarone per cardiology - avoid diltiazem given low EF  GERD - PPI  CAD s/p 3 stents - continue plavix and statin   DVT prophylaxis: heparin  Code Status: Full Code  Family Communication: no family bedside Disposition Plan: pending improvement in tachycardia   Consultants:   Cardiology  Procedures:   none  Antimicrobials:   none    Subjective: Shortness of breath resolved but patient still having persistent tachycardia.  She mentions that she is hopeful to go home soon.  Slept well overnight. Denies chest pain.  Has a rash from EKG stickers  Objective: Vitals:   09/27/17 0614 09/27/17 1446 09/27/17 2118 09/28/17 0610  BP: 129/62 120/79 134/75  113/66  Pulse: 86 (!) 115 (!) 119 (!) 116  Resp: 18 16 16 16   Temp: 98.1 F (36.7 C) 97.7 F (36.5 C) 98.7 F (37.1 C) 98.5 F (36.9 C)  TempSrc: Oral Oral Axillary Oral  SpO2: 96% 98% 98% 95%  Weight:      Height:        Intake/Output Summary (Last 24 hours) at 09/28/2017 1109 Last data filed at  09/27/2017 1730 Gross per 24 hour  Intake 240 ml  Output 200 ml  Net 40 ml   Filed Weights   09/25/17 0948 09/25/17 1645  Weight: 80.7 kg (178 lb) 81.7 kg (180 lb 3.2 oz)    Examination:  Physical Exam  Constitutional: She is well-developed, well-nourished, and in no distress.  Cardiovascular: Intact distal pulses.  Tachycardic, regular  Pulmonary/Chest: Effort normal and breath sounds normal.  Abdominal: Soft. Bowel sounds are normal.  Skin: Rash noted.  See picture         Data Reviewed: I have personally reviewed following labs and imaging studies  CBC: Recent Labs  Lab 09/25/17 0951 09/26/17 0644  WBC 6.0 4.9  NEUTROABS 4.4  --   HGB 12.8 12.6  HCT 39.7 39.0  MCV 91.9 90.5  PLT 209 536   Basic Metabolic Panel: Recent Labs  Lab 09/25/17 0951 09/26/17 0644  NA 138 138  K 5.1 4.7  CL 102 102  CO2 27 28  GLUCOSE 184* 125*  BUN 26* 28*  CREATININE 1.27* 1.44*  CALCIUM 9.0 8.8*   GFR: Estimated Creatinine Clearance: 41.4 mL/min (A) (by C-G formula based on SCr of 1.44 mg/dL (H)). Liver Function Tests: Recent Labs  Lab 09/25/17 0951  AST 29  ALT 27  ALKPHOS 156*  BILITOT 0.5  PROT 7.1  ALBUMIN 4.0   No results for input(s): LIPASE, AMYLASE in the last 168 hours. No results for input(s): AMMONIA in the last 168 hours. Coagulation Profile: No results for input(s): INR, PROTIME in the last 168 hours. Cardiac Enzymes: Recent Labs  Lab 09/25/17 0951 09/25/17 1426 09/25/17 1836 09/25/17 2347 09/26/17 0644  TROPONINI <0.03 <0.03 <0.03 <0.03 <0.03   BNP (last 3 results) No results for input(s): PROBNP in the last 8760 hours. HbA1C: No results for input(s): HGBA1C in the last 72 hours. CBG: Recent Labs  Lab 09/25/17 2002 09/26/17 0823 09/26/17 1208 09/26/17 1634  GLUCAP 192* 109* 123* 141*   Lipid Profile: No results for input(s): CHOL, HDL, LDLCALC, TRIG, CHOLHDL, LDLDIRECT in the last 72 hours. Thyroid Function Tests: No results  for input(s): TSH, T4TOTAL, FREET4, T3FREE, THYROIDAB in the last 72 hours. Anemia Panel: No results for input(s): VITAMINB12, FOLATE, FERRITIN, TIBC, IRON, RETICCTPCT in the last 72 hours. Sepsis Labs: No results for input(s): PROCALCITON, LATICACIDVEN in the last 168 hours.  No results found for this or any previous visit (from the past 240 hour(s)).       Radiology Studies: No results found.      Scheduled Meds: . ALPRAZolam  1 mg Oral BID  . aspirin EC  81 mg Oral Daily  . clopidogrel  75 mg Oral Daily  . furosemide  20 mg Oral Daily  . loratadine  10 mg Oral Daily  . methocarbamol  500 mg Oral TID  . metoprolol tartrate  50 mg Oral Q6H  . pantoprazole  40 mg Oral Daily  . potassium chloride  20 mEq Oral Daily  . rosuvastatin  5 mg Oral q1800  . zolpidem  5 mg  Oral QHS   Continuous Infusions:   LOS: 0 days    Time spent: 25 minutes    Loretha Stapler, MD [facility] Pager [phone]  If 7PM-7AM, please contact night-coverage [email or web address] Password [email or web address] 09/28/2017, 11:09 AM

## 2017-09-28 NOTE — Progress Notes (Signed)
Progress Note  Patient Name: Kristen Garner Date of Encounter: 09/28/2017  Primary Cardiologist: Dr. Kate Sable  Subjective   Breathing more easily compared to admission, no chest pain.  Has not ambulated very much.  Inpatient Medications    Scheduled Meds: . ALPRAZolam  1 mg Oral BID  . aspirin EC  81 mg Oral Daily  . clopidogrel  75 mg Oral Daily  . furosemide  20 mg Oral Daily  . loratadine  10 mg Oral Daily  . methocarbamol  500 mg Oral TID  . metoprolol tartrate  50 mg Oral Q6H  . pantoprazole  40 mg Oral Daily  . potassium chloride  20 mEq Oral Daily  . rosuvastatin  5 mg Oral q1800  . zolpidem  5 mg Oral QHS    PRN Meds: acetaminophen **OR** acetaminophen, albuterol, diphenhydrAMINE-zinc acetate, HYDROcodone-acetaminophen, ondansetron **OR** ondansetron (ZOFRAN) IV, polyethylene glycol   Vital Signs    Vitals:   09/27/17 0614 09/27/17 1446 09/27/17 2118 09/28/17 0610  BP: 129/62 120/79 134/75 113/66  Pulse: 86 (!) 115 (!) 119 (!) 116  Resp: 18 16 16 16   Temp: 98.1 F (36.7 C) 97.7 F (36.5 C) 98.7 F (37.1 C) 98.5 F (36.9 C)  TempSrc: Oral Oral Axillary Oral  SpO2: 96% 98% 98% 95%  Weight:      Height:        Intake/Output Summary (Last 24 hours) at 09/28/2017 1103 Last data filed at 09/27/2017 1730 Gross per 24 hour  Intake 240 ml  Output 200 ml  Net 40 ml   Filed Weights   09/25/17 0948 09/25/17 1645  Weight: 178 lb (80.7 kg) 180 lb 3.2 oz (81.7 kg)    Telemetry    Regular narrow complex tachycardia.  Personally reviewed.  ECG    Tracing from 09/25/2017 shows possible ectopic atrial tachycardia with left bundle branch block, cannot completely exclude atrial flutter with 2:1 block. Personally reviewed.  Physical Exam   GEN: No acute distress.   Neck: No JVD. Cardiac: RRR, no gallop.  Respiratory: Nonlabored. Clear to auscultation bilaterally. GI: Soft, nontender, bowel sounds present. MS: No edema; No deformity. Neuro:   Nonfocal. Psych: Alert and oriented x 3. Normal affect.  Labs    Chemistry Recent Labs  Lab 09/25/17 0951 09/26/17 0644  NA 138 138  K 5.1 4.7  CL 102 102  CO2 27 28  GLUCOSE 184* 125*  BUN 26* 28*  CREATININE 1.27* 1.44*  CALCIUM 9.0 8.8*  PROT 7.1  --   ALBUMIN 4.0  --   AST 29  --   ALT 27  --   ALKPHOS 156*  --   BILITOT 0.5  --   GFRNONAA 43* 37*  GFRAA 50* 43*  ANIONGAP 9 8     Hematology Recent Labs  Lab 09/25/17 0951 09/26/17 0644  WBC 6.0 4.9  RBC 4.32 4.31  HGB 12.8 12.6  HCT 39.7 39.0  MCV 91.9 90.5  MCH 29.6 29.2  MCHC 32.2 32.3  RDW 14.2 14.2  PLT 209 204    Cardiac Enzymes Recent Labs  Lab 09/25/17 1426 09/25/17 1836 09/25/17 2347 09/26/17 0644  TROPONINI <0.03 <0.03 <0.03 <0.03   No results for input(s): TROPIPOC in the last 168 hours.   BNP Recent Labs  Lab 09/25/17 1018  BNP 338.0*     Radiology    No results found.  Cardiac Studies   Echocardiogram 09/27/2017: Study Conclusions  - Left ventricle: The cavity size was mildly  dilated. There was   mild concentric hypertrophy. Systolic function was severely   reduced. The estimated ejection fraction was in the range of 20%   to 25%. Diffuse hypokinesis. Features are consistent with a   pseudonormal left ventricular filling pattern, with concomitant   abnormal relaxation and increased filling pressure (grade 2   diastolic dysfunction). Doppler parameters are consistent with   elevated ventricular end-diastolic filling pressure. - Ventricular septum: Septal motion showed paradox. - Aortic valve: Trileaflet; mildly thickened, mildly calcified   leaflets. There was mild stenosis. Mean gradient (S): 11 mm Hg.   Peak gradient (S): 19 mm Hg. Valve area (VTI): 2.1 cm^2. Valve   area (Vmax): 2.05 cm^2. Valve area (Vmean): 1.98 cm^2. - Mitral valve: There was moderate regurgitation. - Left atrium: The atrium was moderately dilated. - Tricuspid valve: There was mild  regurgitation. - Pulmonary arteries: Systolic pressure was within the normal   range. - Pericardium, extracardiac: A trivial pericardial effusion was   identified posterior to the heart. There was a left pleural   effusion.  Patient Profile     66 y.o. female with history of CAD status post DES x2 to the RCA in November 2017, cardiomyopathy with LVEF 20-25%, previously documented atrial tachycardia and NSVT, left bundle branch block, type 2 diabetes mellitus, and CKD stage III.  She is now admitted to the hospital with shortness of breath and tachycardia, possibly recurrent atrial tachycardia although atrial flutter is not excluded completely.  Cardiac enzymes argue against ACS.  Follow-up echocardiogram shows LVEF 20-25% with diffuse hypokinesis.  Assessment & Plan    1.  SVT, likely atrial tachycardia although atrial flutter with 2:1 block not completely excluded.  She has a previous history of atrial tachycardia supporting the formal diagnosis however.  2.  CAD status post DES x2 to the RCA in November 2017.  No chest pain at this time.  Troponin levels are negative arguing against ACS.  3.  Secondary cardiomyopathy, question tachycardia-mediated in light of problem #1.  LVEF 20-25% with diffuse hypokinesis.  4.  CKD stage III.  Patient states that she feels better clinically, less short of breath.  Recommend follow-up ECG to reassess rhythm with better heart rate control.  Continue aspirin, Plavix, Crestor, low-dose Lasix with potassium supplements.  Plan to initiate oral amiodarone load to see if this might help get her back into sinus rhythm, particularly with associated cardiomyopathy.  Metoprolol dose was just recently increased.  Eventually, this will need to be transitioned to a longer acting formulation for compliance.  Do not anticipate further ischemic testing at this time.  Signed, Rozann Lesches, MD  09/28/2017, 11:03 AM

## 2017-09-28 NOTE — Progress Notes (Signed)
Respiratory Care Note: EKG done and given to the patient's RN at 1200.

## 2017-09-29 ENCOUNTER — Other Ambulatory Visit: Payer: Self-pay

## 2017-09-29 DIAGNOSIS — I447 Left bundle-branch block, unspecified: Secondary | ICD-10-CM

## 2017-09-29 MED ORDER — METOPROLOL SUCCINATE ER 50 MG PO TB24
75.0000 mg | ORAL_TABLET | Freq: Two times a day (BID) | ORAL | Status: DC
  Administered 2017-09-29 (×2): 75 mg via ORAL
  Filled 2017-09-29 (×3): qty 1

## 2017-09-29 MED ORDER — HYDROCORTISONE 1 % EX CREA
TOPICAL_CREAM | Freq: Three times a day (TID) | CUTANEOUS | Status: DC
  Administered 2017-09-29 – 2017-10-01 (×5): 1 via TOPICAL
  Filled 2017-09-29 (×5): qty 1.5

## 2017-09-29 NOTE — Progress Notes (Addendum)
Progress Note  Patient Name: Kristen Garner Date of Encounter: 09/29/2017  Primary Cardiologist: Kate Sable MD  Subjective   Can still feel her HR racing during the night which awakens her. Complaining of whelps on her left lateral chest from EKG leads. Denies chest pain or dyspnea.   Inpatient Medications    Scheduled Meds: . ALPRAZolam  1 mg Oral BID  . amiodarone  200 mg Oral BID  . aspirin EC  81 mg Oral Daily  . clopidogrel  75 mg Oral Daily  . furosemide  20 mg Oral Daily  . loratadine  10 mg Oral Daily  . methocarbamol  500 mg Oral TID  . metoprolol tartrate  50 mg Oral Q6H  . pantoprazole  40 mg Oral Daily  . potassium chloride  20 mEq Oral Daily  . rosuvastatin  5 mg Oral q1800  . zolpidem  5 mg Oral QHS    PRN Meds: acetaminophen **OR** acetaminophen, albuterol, diphenhydrAMINE-zinc acetate, HYDROcodone-acetaminophen, ondansetron **OR** ondansetron (ZOFRAN) IV, polyethylene glycol   Vital Signs    Vitals:   09/28/17 2225 09/29/17 0029 09/29/17 0508 09/29/17 0541  BP: 103/66 97/67 (!) 99/59 109/68  Pulse: (!) 112 (!) 111 (!) 109 (!) 112  Resp: 16  18   Temp: 97.6 F (36.4 C)  97.9 F (36.6 C)   TempSrc: Oral  Oral   SpO2: 99%  94% 97%  Weight:      Height:        Intake/Output Summary (Last 24 hours) at 09/29/2017 0729 Last data filed at 09/28/2017 1700 Gross per 24 hour  Intake 720 ml  Output -  Net 720 ml   Filed Weights   09/25/17 0948 09/25/17 1645  Weight: 178 lb (80.7 kg) 180 lb 3.2 oz (81.7 kg)    Telemetry    Probable ectopic atrial tachycardia. Personally Reviewed.  Physical Exam   GEN: No acute distress.   Neck: No JVD Cardiac: RRR, no gallops.  Respiratory: Crackles in the left base, scant wheezes.  GI: Soft, nontender, non-distended  MS: No edema; No deformity. Erythema on the left lateral chest, from leads.  Neuro:  Nonfocal  Psych: Normal affect   Labs    Chemistry Recent Labs  Lab 09/25/17 0951  09/26/17 0644  NA 138 138  K 5.1 4.7  CL 102 102  CO2 27 28  GLUCOSE 184* 125*  BUN 26* 28*  CREATININE 1.27* 1.44*  CALCIUM 9.0 8.8*  PROT 7.1  --   ALBUMIN 4.0  --   AST 29  --   ALT 27  --   ALKPHOS 156*  --   BILITOT 0.5  --   GFRNONAA 43* 37*  GFRAA 50* 43*  ANIONGAP 9 8     Hematology Recent Labs  Lab 09/25/17 0951 09/26/17 0644  WBC 6.0 4.9  RBC 4.32 4.31  HGB 12.8 12.6  HCT 39.7 39.0  MCV 91.9 90.5  MCH 29.6 29.2  MCHC 32.2 32.3  RDW 14.2 14.2  PLT 209 204    Cardiac Enzymes Recent Labs  Lab 09/25/17 1426 09/25/17 1836 09/25/17 2347 09/26/17 0644  TROPONINI <0.03 <0.03 <0.03 <0.03   No results for input(s): TROPIPOC in the last 168 hours.   BNP Recent Labs  Lab 09/25/17 1018  BNP 338.0*      Radiology    No results found.  Cardiac Studies   Echocardiogram 09/27/2017  Left ventricle: The cavity size was mildly dilated. There was   mild  concentric hypertrophy. Systolic function was severely   reduced. The estimated ejection fraction was in the range of 20%   to 25%. Diffuse hypokinesis. Features are consistent with a   pseudonormal left ventricular filling pattern, with concomitant   abnormal relaxation and increased filling pressure (grade 2   diastolic dysfunction). Doppler parameters are consistent with   elevated ventricular end-diastolic filling pressure. - Ventricular septum: Septal motion showed paradox. - Aortic valve: Trileaflet; mildly thickened, mildly calcified   leaflets. There was mild stenosis. Mean gradient (S): 11 mm Hg.   Peak gradient (S): 19 mm Hg. Valve area (VTI): 2.1 cm^2. Valve   area (Vmax): 2.05 cm^2. Valve area (Vmean): 1.98 cm^2. - Mitral valve: There was moderate regurgitation. - Left atrium: The atrium was moderately dilated. - Tricuspid valve: There was mild regurgitation. - Pulmonary arteries: Systolic pressure was within the normal   range. - Pericardium, extracardiac: A trivial pericardial effusion  was   identified posterior to the heart. There was a left pleural   effusion.  Patient Profile     66 y.o. female with history of CAD status post DES x2 to the RCA in November 2017, cardiomyopathy with LVEF 20-25%, previously documented atrial tachycardia and NSVT, left bundle branch block, type 2 diabetes mellitus, and CKD stage III.  She was admitted to the hospital with shortness of breath and tachycardia, possibly recurrent atrial tachycardia although atrial flutter is not excluded completely. Cardiac enzymes argue against ACS.  Follow-up echocardiogram shows LVEF 20-25% with diffuse hypokinesis. Oral Amiodarone was added yesterday.  Assessment & Plan    1.  Atrial tachycardia: Received amiodarone yesterday. States she felt her HR race overnight which awakened her. Review of telemetry documented HR of 100-115 bpm. Continue amidarone loading.  2. CAD: History of drug-eluting stents x2 to the right coronary artery.  She denies recurrent chest pain.  Remains on dual antiplatelet therapy with Plavix and aspirin,   Early on metoprolol 50 mg p.o. every 6 hours. To be able to transition to long-acting prior to discharge.  3.  Secondary cardiomyopathy: Uncertain etiology, considering tachycardia-mediated.  Current LVEF of 20-25% per echo planar on 09/27/2017.Marland Kitchen  Remains on Lasix 20 mg daily.  4.  Hypercholesterolemia: Remains on statin therapy with simvastatin 5 mg daily.   Signed, Phill Myron. West Pugh, ANP, AACC   09/29/2017, 7:29 AM     Attending note:  Patient seen and examined.  Reviewed interval hospital course and discussed the case with Ms. West Pugh.  Patient continues to report intermittent sense of palpitations.  No active chest pain however.  She was started on oral amiodarone 200 mg twice daily yesterday in an effort to better control heart rate and rhythm, already on relatively high dose metoprolol.  On examination she reports no chest pain, lungs are clear with the exception  of a few rhonchi, no wheezing.  Cardiac exam reveals RRR without gallop, PMI is indistinct.  Lab work shows creatinine 1.44, potassium 4.7.  I personally reviewed her ECG from yesterday which showed probable ectopic atrial tachycardia with left bundle branch block, cannot completely exclude atypical atrial flutter with 2:1 block.  Patient with continued suspected ectopic atrial tachycardia with left bundle branch block and potentially a tachycardia-mediated cardiomyopathy.  No evidence of ACS.  Continue amiodarone load at 200 mg twice daily for now, aspirin and Plavix for history of CAD and previous DES intervention.  Hold Lasix and potassium for now with increasing creatinine.  We will also convert from Lopressor every  6 hours to Toprol-XL beginning at 75 mg twice daily.  Check LFTs and TSH tomorrow.  Satira Sark, M.D., F.A.C.C.

## 2017-09-29 NOTE — Progress Notes (Signed)
PROGRESS NOTE    Kristen Garner  NLZ:[id number] DOB: 1951-05-05 DOA: 09/25/2017 PCP: Sharilyn Sites, MD    Brief Narrative:  Kristen Garner is a 66 y.o. female with medical history significant of anxiety, CAD, CKD Stage III, DM, Chronic systolic CHF that presents for shortness of breath that began last evening.  Reports initially felt as though she had sinus drainage and shortness of breath, similarly to how she felt when she had her heart attack a year ago.  She reports that yesterday morning she felt the same and took her metoprolol and felt somewhat normal throughout the day but then began feeling symptoms again last night.  She then began to get worried again because of her heart attack symptoms last year that required three stents.  Patient takes all her medications as prescribed but did not take her medicine this morning.  Currently she feels short of breath is having a hard time speaking in full sentences. Denies chest pain, chest pressure, denies sweating, arm pain, nausea, vomiting.  Reports that when she had her NSTEMI she did not have chest pain at that time either.  2 sick contacts of sinus congestion type symptoms.    ED Course: Patient was seen and examined by EDP.  Blood work obtained showed troponin of <0.03, EKG was essentially unchanged from previously.  Cardiology consulted given patient's history of dyspnea in setting of NSTEMI.  TRH asked to admit to cycle cardiac enzymes.  Patient was admitted and cardiology evaluated patient. She was started on PO metoprolol and heart rate was monitored. Metoprolol increased on 11/4 and then again on 11/5.  Heart rate continued to be tachycardic.   Assessment & Plan:   Principal Problem:   Dyspnea Active Problems:   H/O atrial tachycardia   GERD (gastroesophageal reflux disease)   Acute on chronic combined systolic and diastolic CHF (congestive heart failure) (HCC)   Atrial tachycardia (HCC)   Tachycardia   Dyspnea -  troponins negative x 3 - supplemental oxygen as needed - albuterol neb if needed - on plavix - echo stable from previous (Left ventricle: The cavity size was mildly dilated. There was mild concentric hypertrophy. Systolic function was severely reduced. The estimated ejection fraction was in the range of 20% to 25%. Diffuse hypokinesis. Features are consistent with a pseudonormal left ventricular filling pattern, with concomitant abnormal relaxation and increased filling pressure (grade 2 diastolic dysfunction). Doppler parameters are consistent with   elevated ventricular end-diastolic filling pressure.)  Acute on chronic combined systolic and diastolic CHF - given 1 dose of IV lasix in ED per cardiology - furosemide 20mg  PO daily - echo results above  Allergic reaction to EKG stickers - benadryl cream  H/o atrial tachycardia - transitioning to long acting metoprolol - continue amiodarone per cardiology - avoid diltiazem given low EF - check LFTs and TSH in am per cards  GERD - PPI  CAD s/p 3 stents - continue plavix and statin   DVT prophylaxis: heparin  Code Status: Full Code  Family Communication: no family bedside Disposition Plan: pending improvement in tachycardia   Consultants:   Cardiology  Procedures:   none  Antimicrobials:   none    Subjective: Patient experienced palpitations last night.  Occasionally today has been able to feel her heart racing.  Slept well otherwise and has tolerated her diet.  Objective: Vitals:   09/29/17 0029 09/29/17 0508 09/29/17 0541 09/29/17 1100  BP: 97/67 (!) 99/59 109/68 122/73  Pulse: (!) 111 (!) 109 Marland Kitchen)  112 (!) 113  Resp:  18  18  Temp:  97.9 F (36.6 C)  98.1 F (36.7 C)  TempSrc:  Oral    SpO2:  94% 97% 98%  Weight:      Height:        Intake/Output Summary (Last 24 hours) at 09/29/2017 1424 Last data filed at 09/28/2017 1700 Gross per 24 hour  Intake 240 ml  Output -  Net 240 ml   Filed Weights     09/25/17 0948 09/25/17 1645  Weight: 80.7 kg (178 lb) 81.7 kg (180 lb 3.2 oz)    Examination:  Physical Exam  Constitutional: She is well-developed, well-nourished, and in no distress.  Cardiovascular: Intact distal pulses.  No murmur heard. Tachycardic, regular  Pulmonary/Chest: Effort normal and breath sounds normal. No respiratory distress. She has no wheezes. She has no rales.  Abdominal: Soft. Bowel sounds are normal.  Skin: Rash noted.  See picture  Psychiatric: Mood, affect and judgment normal.         Data Reviewed: I have personally reviewed following labs and imaging studies  CBC: Recent Labs  Lab 09/25/17 0951 09/26/17 0644  WBC 6.0 4.9  NEUTROABS 4.4  --   HGB 12.8 12.6  HCT 39.7 39.0  MCV 91.9 90.5  PLT 209 829   Basic Metabolic Panel: Recent Labs  Lab 09/25/17 0951 09/26/17 0644  NA 138 138  K 5.1 4.7  CL 102 102  CO2 27 28  GLUCOSE 184* 125*  BUN 26* 28*  CREATININE 1.27* 1.44*  CALCIUM 9.0 8.8*   GFR: Estimated Creatinine Clearance: 41.4 mL/min (A) (by C-G formula based on SCr of 1.44 mg/dL (H)). Liver Function Tests: Recent Labs  Lab 09/25/17 0951  AST 29  ALT 27  ALKPHOS 156*  BILITOT 0.5  PROT 7.1  ALBUMIN 4.0   No results for input(s): LIPASE, AMYLASE in the last 168 hours. No results for input(s): AMMONIA in the last 168 hours. Coagulation Profile: No results for input(s): INR, PROTIME in the last 168 hours. Cardiac Enzymes: Recent Labs  Lab 09/25/17 0951 09/25/17 1426 09/25/17 1836 09/25/17 2347 09/26/17 0644  TROPONINI <0.03 <0.03 <0.03 <0.03 <0.03   BNP (last 3 results) No results for input(s): PROBNP in the last 8760 hours. HbA1C: No results for input(s): HGBA1C in the last 72 hours. CBG: Recent Labs  Lab 09/25/17 2002 09/26/17 0823 09/26/17 1208 09/26/17 1634  GLUCAP 192* 109* 123* 141*   Lipid Profile: No results for input(s): CHOL, HDL, LDLCALC, TRIG, CHOLHDL, LDLDIRECT in the last 72  hours. Thyroid Function Tests: No results for input(s): TSH, T4TOTAL, FREET4, T3FREE, THYROIDAB in the last 72 hours. Anemia Panel: No results for input(s): VITAMINB12, FOLATE, FERRITIN, TIBC, IRON, RETICCTPCT in the last 72 hours. Sepsis Labs: No results for input(s): PROCALCITON, LATICACIDVEN in the last 168 hours.  No results found for this or any previous visit (from the past 240 hour(s)).       Radiology Studies: No results found.      Scheduled Meds: . ALPRAZolam  1 mg Oral BID  . amiodarone  200 mg Oral BID  . aspirin EC  81 mg Oral Daily  . clopidogrel  75 mg Oral Daily  . hydrocortisone cream   Topical TID  . loratadine  10 mg Oral Daily  . methocarbamol  500 mg Oral TID  . metoprolol succinate  75 mg Oral BID  . pantoprazole  40 mg Oral Daily  . rosuvastatin  5 mg Oral  q1800  . zolpidem  5 mg Oral QHS   Continuous Infusions:   LOS: 1 day    Time spent: 25 minutes    Loretha Stapler, MD [facility] Pager [phone]  If 7PM-7AM, please contact night-coverage [email or web address] Password [email or web address] 09/29/2017, 2:24 PM

## 2017-09-30 ENCOUNTER — Other Ambulatory Visit: Payer: Self-pay

## 2017-09-30 LAB — HEPATIC FUNCTION PANEL
ALK PHOS: 146 U/L — AB (ref 38–126)
ALT: 25 U/L (ref 14–54)
AST: 30 U/L (ref 15–41)
Albumin: 3.5 g/dL (ref 3.5–5.0)
BILIRUBIN INDIRECT: 0.3 mg/dL (ref 0.3–0.9)
Bilirubin, Direct: 0.1 mg/dL (ref 0.1–0.5)
TOTAL PROTEIN: 6.4 g/dL — AB (ref 6.5–8.1)
Total Bilirubin: 0.4 mg/dL (ref 0.3–1.2)

## 2017-09-30 LAB — TSH: TSH: 4.421 u[IU]/mL (ref 0.350–4.500)

## 2017-09-30 MED ORDER — ENOXAPARIN SODIUM 40 MG/0.4ML ~~LOC~~ SOLN
40.0000 mg | SUBCUTANEOUS | Status: DC
  Administered 2017-09-30: 40 mg via SUBCUTANEOUS
  Filled 2017-09-30: qty 0.4

## 2017-09-30 MED ORDER — METOPROLOL SUCCINATE ER 50 MG PO TB24
100.0000 mg | ORAL_TABLET | Freq: Two times a day (BID) | ORAL | Status: DC
  Administered 2017-09-30 – 2017-10-01 (×3): 100 mg via ORAL
  Filled 2017-09-30 (×3): qty 2

## 2017-09-30 NOTE — Progress Notes (Signed)
Patient ambulate hallways with supervision.  Tolerated well.  Ambulated from room to nurses station and back.  HR maintained around 112.  No complaints per patient.

## 2017-09-30 NOTE — Progress Notes (Signed)
Progress Note  Patient Name: Kristen Garner Date of Encounter: 09/30/2017  Primary Cardiologist: Dr. Kate Sable  Subjective   Did not sleep well, has had some constipation.  No chest pain at this time and feels less of a sense of palpitations.  Inpatient Medications    Scheduled Meds: . ALPRAZolam  1 mg Oral BID  . amiodarone  200 mg Oral BID  . aspirin EC  81 mg Oral Daily  . clopidogrel  75 mg Oral Daily  . hydrocortisone cream   Topical TID  . loratadine  10 mg Oral Daily  . methocarbamol  500 mg Oral TID  . metoprolol succinate  75 mg Oral BID  . pantoprazole  40 mg Oral Daily  . rosuvastatin  5 mg Oral q1800  . zolpidem  5 mg Oral QHS    PRN Meds: acetaminophen **OR** acetaminophen, albuterol, diphenhydrAMINE-zinc acetate, HYDROcodone-acetaminophen, ondansetron **OR** ondansetron (ZOFRAN) IV, polyethylene glycol   Vital Signs    Vitals:   09/29/17 0541 09/29/17 1100 09/29/17 2042 09/29/17 2046  BP: 109/68 122/73  112/71  Pulse: (!) 112 (!) 113  (!) 113  Resp:  18  20  Temp:  98.1 F (36.7 C)  98 F (36.7 C)  TempSrc:    Oral  SpO2: 97% 98% 93% 98%  Weight:      Height:       No intake or output data in the 24 hours ending 09/30/17 0906 Filed Weights   09/25/17 0948 09/25/17 1645  Weight: 178 lb (80.7 kg) 180 lb 3.2 oz (81.7 kg)    Telemetry    Probable continued ectopic atrial tachycardia.  Personally reviewed.  ECG    Tracing from 09/30/2017 shows probable ectopic atrial tachycardia versus atypical atrial flutter with 2:1 block and left bundle branch block.  Personally reviewed.  Physical Exam   GEN:  Chronically ill-appearing.  No acute distress.   Neck: No JVD. Cardiac:  Indistinct PMI, RRR, no gallop.  Respiratory:  Decreased breath sounds with scattered rhonchi, no wheezing. GI: Soft, nontender, bowel sounds present. MS: No edema; No deformity. Neuro:  Nonfocal. Psych: Alert and oriented x 3. Normal affect.  Labs      Chemistry Recent Labs  Lab 09/25/17 0951 09/26/17 0644 09/30/17 0546  NA 138 138  --   K 5.1 4.7  --   CL 102 102  --   CO2 27 28  --   GLUCOSE 184* 125*  --   BUN 26* 28*  --   CREATININE 1.27* 1.44*  --   CALCIUM 9.0 8.8*  --   PROT 7.1  --  6.4*  ALBUMIN 4.0  --  3.5  AST 29  --  30  ALT 27  --  25  ALKPHOS 156*  --  146*  BILITOT 0.5  --  0.4  GFRNONAA 43* 37*  --   GFRAA 50* 43*  --   ANIONGAP 9 8  --      Hematology Recent Labs  Lab 09/25/17 0951 09/26/17 0644  WBC 6.0 4.9  RBC 4.32 4.31  HGB 12.8 12.6  HCT 39.7 39.0  MCV 91.9 90.5  MCH 29.6 29.2  MCHC 32.2 32.3  RDW 14.2 14.2  PLT 209 204    Cardiac Enzymes Recent Labs  Lab 09/25/17 1426 09/25/17 1836 09/25/17 2347 09/26/17 0644  TROPONINI <0.03 <0.03 <0.03 <0.03   No results for input(s): TROPIPOC in the last 168 hours.   BNP Recent Labs  Lab  09/25/17 1018  BNP 338.0*     Radiology    No results found.  Cardiac Studies   Echocardiogram 09/27/2017: Study Conclusions  - Left ventricle: The cavity size was mildly dilated. There was   mild concentric hypertrophy. Systolic function was severely   reduced. The estimated ejection fraction was in the range of 20%   to 25%. Diffuse hypokinesis. Features are consistent with a   pseudonormal left ventricular filling pattern, with concomitant   abnormal relaxation and increased filling pressure (grade 2   diastolic dysfunction). Doppler parameters are consistent with   elevated ventricular end-diastolic filling pressure. - Ventricular septum: Septal motion showed paradox. - Aortic valve: Trileaflet; mildly thickened, mildly calcified   leaflets. There was mild stenosis. Mean gradient (S): 11 mm Hg.   Peak gradient (S): 19 mm Hg. Valve area (VTI): 2.1 cm^2. Valve   area (Vmax): 2.05 cm^2. Valve area (Vmean): 1.98 cm^2. - Mitral valve: There was moderate regurgitation. - Left atrium: The atrium was moderately dilated. - Tricuspid valve:  There was mild regurgitation. - Pulmonary arteries: Systolic pressure was within the normal   range. - Pericardium, extracardiac: A trivial pericardial effusion was   identified posterior to the heart. There was a left pleural   effusion.  Patient Profile     66 y.o. female with a history of CAD status post DES x2 to the RCA in November 2017, cardiomyopathy with LVEF 20-25%, ectopic atrial tachycardia and left bundle branch block, type 2 diabetes mellitus, and CKD stage III.  Assessment & Plan    1.  Possible ectopic atrial tachycardia versus atypical atrial flutter.  She continues on oral amiodarone load with transition of beta-blocker to Toprol-XL.  Heart rate control is better although she has not returned to sinus rhythm.  Not anticoagulating at this time since on aspirin and Plavix.  2.  CAD with history of DES x2 to the RCA in November 2017.  No current angina symptoms at this time. Continue medical therapy including dual antiplatelet regimen.  3.  Secondary cardiomyopathy, possibly tachycardia-mediated.  LVEF 20-25% with diffuse hypokinesis.  4.  Mixed hyperlipidemia, remains on Crestor.  Resting heart rate around 100-110 at this time.  She is on amiodarone 200 mg twice daily as well as Toprol-XL which will be increased to 100 mg twice daily.  Otherwise continue aspirin, Plavix, and Crestor.  Would have her increase activity today, ambulate with assistance.  If she tolerates well, may be able to consider discharge with close outpatient follow-up in cardiology with Dr. Bronson Ing.  Suspect she will need outpatient EP assessment as well for further rhythm management.  Signed, Rozann Lesches, MD  09/30/2017, 9:06 AM

## 2017-09-30 NOTE — Progress Notes (Signed)
PROGRESS NOTE                                                                                                                                                                                                             Patient Demographics:    Kristen Garner, is a 66 y.o. female, DOB - Jul 20, 1951, GOT:[id number]  Admit date - 09/25/2017   Admitting Physician Eber Jones, MD  Outpatient Primary MD for the patient is Sharilyn Sites, MD  LOS - 2  Outpatient Specialists:  Chief Complaint  Patient presents with  . Shortness of Breath       Brief Narrative   66 year old female with history of coronary artery disease, chronic systolic CHF, chronic daily disease stage III and anxiety presented with increased shortness of breath. Also complained of palpitations. Patient being managed for suspected ectopic atrial tachycardia with LBBB.    Subjective:   Reports her breathing to be better and heart rate better controlled (low 100s)   Assessment  & Plan :    Principal Problem: Atrial tachycardia Toprol XL  dose increased to 100 mg daily. Added amiodarone this admission.Heart rate in low 100s. Continue aspirin, Plavix and statin. Encouraged to ambulate and if heart rate stable with ambulation can be discharged in a.m. TSH and LFTs normal.  Active Problems: acute on chronic combined systolic and diastolic CHF Suspect this is due to her chronic cardiomyopathy with symptoms manifested by atrial tachycardia. 2-D echo unchanged from prior with EF of 20-25% and diffuse hypokinesis, grade 2 diastolic dysfunction. Monitor strict I/O and daily weight. Appears euvolemic. Has not required further Lasix. Continue aspirin, statin and beta blocker.     GERD (gastroesophageal reflux disease) Continue PPI         Code Status : Full code  Family Communication  : None at bedside  Disposition Plan  :Home tomorrow if heart  rate controlled  Barriers For Discharge : Active symptoms  Consults  :  Cardiology  Procedures  : 2-D echo  DVT Prophylaxis  :  Lovenox -   Lab Results  Component Value Date   PLT 204 09/26/2017    Antibiotics  :    Anti-infectives (From admission, onward)   None        Objective:   Vitals:   09/29/17 0541 09/29/17 1100 09/29/17 2042 09/29/17 2046  BP: 109/68 122/73  112/71  Pulse: (!) 112 (!) 113  (!) 113  Resp:  18  20  Temp:  98.1 F (36.7 C)  98 F (36.7 C)  TempSrc:    Oral  SpO2: 97% 98% 93% 98%  Weight:      Height:        Wt Readings from Last 3 Encounters:  09/25/17 81.7 kg (180 lb 3.2 oz)  07/21/17 80.7 kg (178 lb)  07/14/17 80.7 kg (178 lb)    No intake or output data in the 24 hours ending 09/30/17 1105   Physical Exam  Gen: not in distress HEENT: , moist mucosa, supple neck Chest: clear b/l, no added sounds CVS: s1 and S2 regular, tachycardic, No murmurs rub or gallop GI: soft, NT, ND, BS+ Musculoskeletal: warm, no edema     Data Review:    CBC Recent Labs  Lab 09/25/17 0951 09/26/17 0644  WBC 6.0 4.9  HGB 12.8 12.6  HCT 39.7 39.0  PLT 209 204  MCV 91.9 90.5  MCH 29.6 29.2  MCHC 32.2 32.3  RDW 14.2 14.2  LYMPHSABS 1.0  --   MONOABS 0.3  --   EOSABS 0.2  --   BASOSABS 0.1  --     Chemistries  Recent Labs  Lab 09/25/17 0951 09/26/17 0644 09/30/17 0546  NA 138 138  --   K 5.1 4.7  --   CL 102 102  --   CO2 27 28  --   GLUCOSE 184* 125*  --   BUN 26* 28*  --   CREATININE 1.27* 1.44*  --   CALCIUM 9.0 8.8*  --   AST 29  --  30  ALT 27  --  25  ALKPHOS 156*  --  146*  BILITOT 0.5  --  0.4   ------------------------------------------------------------------------------------------------------------------ No results for input(s): CHOL, HDL, LDLCALC, TRIG, CHOLHDL, LDLDIRECT in the last 72 hours.  Lab Results  Component Value Date   HGBA1C 8.5 (H) 10/10/2016    ------------------------------------------------------------------------------------------------------------------ Recent Labs    09/30/17 0542  TSH 4.421   ------------------------------------------------------------------------------------------------------------------ No results for input(s): VITAMINB12, FOLATE, FERRITIN, TIBC, IRON, RETICCTPCT in the last 72 hours.  Coagulation profile No results for input(s): INR, PROTIME in the last 168 hours.  No results for input(s): DDIMER in the last 72 hours.  Cardiac Enzymes Recent Labs  Lab 09/25/17 1836 09/25/17 2347 09/26/17 0644  TROPONINI <0.03 <0.03 <0.03   ------------------------------------------------------------------------------------------------------------------    Component Value Date/Time   BNP 338.0 (H) 09/25/2017 1018    Inpatient Medications  Scheduled Meds: . ALPRAZolam  1 mg Oral BID  . amiodarone  200 mg Oral BID  . aspirin EC  81 mg Oral Daily  . clopidogrel  75 mg Oral Daily  . hydrocortisone cream   Topical TID  . loratadine  10 mg Oral Daily  . methocarbamol  500 mg Oral TID  . metoprolol succinate  100 mg Oral BID  . pantoprazole  40 mg Oral Daily  . rosuvastatin  5 mg Oral q1800  . zolpidem  5 mg Oral QHS   Continuous Infusions: PRN Meds:.acetaminophen **OR** acetaminophen, albuterol, diphenhydrAMINE-zinc acetate, HYDROcodone-acetaminophen, ondansetron **OR** ondansetron (ZOFRAN) IV, polyethylene glycol  Micro Results No results found for this or any previous visit (from the past 240 hour(s)).  Radiology Reports Dg Chest 2 View  Result Date: 09/25/2017 CLINICAL DATA:  Shortness of breath for 2 days. EXAM: CHEST  2 VIEW COMPARISON:  03/28/2017 FINDINGS: Cardiomegaly with  vascular congestion. Interstitial prominence and increased markings in the lung bases, most likely edema. Findings are similar prior study. Small bilateral effusions. No acute bony abnormality. IMPRESSION: Cardiomegaly  with mild interstitial edema and small effusions. Findings similar to prior study. Electronically Signed   By: Rolm Baptise M.D.   On: 09/25/2017 11:09    Time Spent in minutes  25   Louellen Molder M.D on 09/30/2017 at 11:05 AM  Between 7am to 7pm - Pager - [phone]  After 7pm go to [email or web address] - password [facility]  [facility] -  Office  [phone]

## 2017-10-01 DIAGNOSIS — I5043 Acute on chronic combined systolic (congestive) and diastolic (congestive) heart failure: Secondary | ICD-10-CM

## 2017-10-01 DIAGNOSIS — K219 Gastro-esophageal reflux disease without esophagitis: Secondary | ICD-10-CM

## 2017-10-01 LAB — BASIC METABOLIC PANEL
ANION GAP: 10 (ref 5–15)
BUN: 36 mg/dL — ABNORMAL HIGH (ref 6–20)
CALCIUM: 8.6 mg/dL — AB (ref 8.9–10.3)
CO2: 26 mmol/L (ref 22–32)
CREATININE: 1.41 mg/dL — AB (ref 0.44–1.00)
Chloride: 100 mmol/L — ABNORMAL LOW (ref 101–111)
GFR, EST AFRICAN AMERICAN: 44 mL/min — AB (ref >60)
GFR, EST NON AFRICAN AMERICAN: 38 mL/min — AB (ref >60)
Glucose, Bld: 183 mg/dL — ABNORMAL HIGH (ref 65–99)
Potassium: 4.4 mmol/L (ref 3.5–5.1)
SODIUM: 136 mmol/L (ref 135–145)

## 2017-10-01 LAB — MAGNESIUM: MAGNESIUM: 2.1 mg/dL (ref 1.7–2.4)

## 2017-10-01 MED ORDER — AMIODARONE HCL 200 MG PO TABS: 200.0000 mg | ORAL_TABLET | Freq: Two times a day (BID) | ORAL | 0 refills | Status: DC

## 2017-10-01 MED ORDER — METOPROLOL SUCCINATE ER 100 MG PO TB24
100.0000 mg | ORAL_TABLET | Freq: Two times a day (BID) | ORAL | 0 refills | Status: DC
Start: 2017-10-01 — End: 2017-10-07

## 2017-10-01 NOTE — Discharge Summary (Signed)
Physician Discharge Summary  Kristen Garner IRW:[id number] DOB: February 13, 1951 DOA: 09/25/2017  PCP: Sharilyn Sites, MD  Admit date: 09/25/2017 Discharge date: 10/01/2017  Time spent: >35 minutes  Recommendations for Outpatient Follow-up:  Cardiology in 1 week PCP in 3-7 days as needed  Discharge Diagnoses:  Principal Problem:   Dyspnea Active Problems:   H/O atrial tachycardia   GERD (gastroesophageal reflux disease)   Acute on chronic combined systolic and diastolic CHF (congestive heart failure) (HCC)   Atrial tachycardia (HCC)   Tachycardia   Discharge Condition: stable   Diet recommendation: low sodium   Filed Weights   09/25/17 0948 09/25/17 1645  Weight: 80.7 kg (178 lb) 81.7 kg (180 lb 3.2 oz)    History of present illness:  66 year old female with history of coronary artery disease, chronic systolic CHF, chronic daily disease stage III and anxiety presented with increased shortness of breath. Also complained of palpitations. Patient being managed for suspected ectopic atrial tachycardia with LBBB.    Hospital Course:   Atrial tachycardia. Controlled with Toprol XL 100 mg BID +amiodarone per cardiology. Patient is asymptomatic. I have d/w cardiology who will scheduled outpatient follow up at discharge. TSH and LFTs normal.  Acute on chronic combined systolic and diastolic CHF. Resolved. Suspect this is due to her chronic cardiomyopathy with symptoms manifested by atrial tachycardia. 2-D echo unchanged from prior with EF of 20-25% and diffuse hypokinesis, grade 2 diastolic dysfunction.cont home Lasix. Continue aspirin, statin and beta blocker.  CAD status post DES x2 to the RCA in November 2017. No acute pains. Cont aspirin, Plavix and statin.  GERD (gastroesophageal reflux disease). Continue PPI  Procedures:  Echo  (i.e. Studies not automatically included, echos, thoracentesis, etc; not x-rays)  Consultations:  Cardiology   Discharge Exam: Vitals:   09/30/17 2241 10/01/17 0641  BP: 110/66 118/71  Pulse: (!) 111 (!) 101  Resp: 20 20  Temp: 98 F (36.7 C) 98.2 F (36.8 C)  SpO2: 97% 98%    General: alert. No distress. Wants to go home  Cardiovascular: s1,s2 rrr Respiratory: CTA BL  Discharge Instructions  Discharge Instructions    Diet - low sodium heart healthy   Complete by:  As directed    Increase activity slowly   Complete by:  As directed      Allergies as of 10/01/2017      Reactions   Adhesive [tape] Rash   Codeine Itching   Penicillins Other (See Comments)   Child hood allergy Has patient had a PCN reaction causing immediate rash, facial/tongue/throat swelling, SOB or lightheadedness with hypotension: Unknown Has patient had a PCN reaction causing severe rash involving mucus membranes or skin necrosis: Unknown Has patient had a PCN reaction that required hospitalization: No Has patient had a PCN reaction occurring within the last 10 years: No If all of the above answers are "NO", then may proceed with Cephalosporin use.   Sulfonamide Derivatives Itching   Iodine    Patient states that she got a cat scratch and her mom put iodine on it and then got catch scratch fever      Medication List    STOP taking these medications   ibuprofen 600 MG tablet Commonly known as:  ADVIL,MOTRIN   levofloxacin 750 MG tablet Commonly known as:  LEVAQUIN   predniSONE 20 MG tablet Commonly known as:  DELTASONE     TAKE these medications   acyclovir 400 MG tablet Commonly known as:  ZOVIRAX Take 1 tablet (400 mg total)  by mouth 5 (five) times daily. For 10 days   albuterol 108 (90 Base) MCG/ACT inhaler Commonly known as:  PROVENTIL HFA;VENTOLIN HFA Inhale 2 puffs into the lungs every 4 (four) hours as needed for wheezing or shortness of breath.   ALPRAZolam 1 MG tablet Commonly known as:  XANAX Take 1 mg by mouth 2 (two) times daily.   amiodarone 200 MG tablet Commonly known as:  PACERONE Take 1 tablet (200 mg  total) 2 (two) times daily by mouth.   aspirin 81 MG EC tablet Take 1 tablet (81 mg total) by mouth daily.   clopidogrel 75 MG tablet Commonly known as:  PLAVIX TAKE 1 TABLET BY MOUTH EVERY DAY   dicyclomine 20 MG tablet Commonly known as:  BENTYL Take one every 6 hours as needed for abdominal cramps   furosemide 40 MG tablet Commonly known as:  LASIX Take 0.5 tablets (20 mg total) by mouth daily.   HYDROcodone-acetaminophen 5-325 MG tablet Commonly known as:  NORCO/VICODIN Take one tab po q 6 hrs prn pain What changed:    how much to take  how to take this  when to take this  reasons to take this  additional instructions   methocarbamol 500 MG tablet Commonly known as:  ROBAXIN Take 1 tablet (500 mg total) by mouth 3 (three) times daily.   metoprolol succinate 100 MG 24 hr tablet Commonly known as:  TOPROL-XL Take 1 tablet (100 mg total) 2 (two) times daily by mouth. Take with or immediately following a meal. What changed:    medication strength  how much to take   mometasone 50 MCG/ACT nasal spray Commonly known as:  NASONEX Place 2 sprays into the nose daily.   nitroGLYCERIN 0.4 MG SL tablet Commonly known as:  NITROSTAT Place 0.4 mg under the tongue every 5 (five) minutes as needed for chest pain.   pantoprazole 40 MG tablet Commonly known as:  PROTONIX Take 1 tablet (40 mg total) by mouth daily.   Potassium Chloride ER 20 MEQ Tbcr Take 20 mEq by mouth daily.   rosuvastatin 5 MG tablet Commonly known as:  CRESTOR Take 1 tablet (5 mg total) by mouth daily.   zolpidem 10 MG tablet Commonly known as:  AMBIEN Take 10 mg by mouth at bedtime.      Allergies  Allergen Reactions  . Adhesive [Tape] Rash  . Codeine Itching  . Penicillins Other (See Comments)    Child hood allergy Has patient had a PCN reaction causing immediate rash, facial/tongue/throat swelling, SOB or lightheadedness with hypotension: Unknown Has patient had a PCN reaction  causing severe rash involving mucus membranes or skin necrosis: Unknown Has patient had a PCN reaction that required hospitalization: No Has patient had a PCN reaction occurring within the last 10 years: No If all of the above answers are "NO", then may proceed with Cephalosporin use.   . Sulfonamide Derivatives Itching  . Iodine     Patient states that she got a cat scratch and her mom put iodine on it and then got catch scratch fever   Follow-up Information    Sharilyn Sites, MD. Schedule an appointment as soon as possible for a visit in 1 week(s).   Specialty:  Family Medicine Contact information: [address] [place] [address] [phone]        Lendon Colonel, NP. Schedule an appointment as soon as possible for a visit in 1 week(s).   Specialties:  Nurse Practitioner, Radiology, Cardiology Contact  information: [place] [address] [phone]        Erma Heritage, PA-C Follow up on 10/12/2017.   Specialties:  Physician Assistant, Cardiology Why:  2:30. Pleasr bring all medications with you.  Contact information: [address] [place] [address] [phone]            The results of significant diagnostics from this hospitalization (including imaging, microbiology, ancillary and laboratory) are listed below for reference.    Significant Diagnostic Studies: Dg Chest 2 View  Result Date: 09/25/2017 CLINICAL DATA:  Shortness of breath for 2 days. EXAM: CHEST  2 VIEW COMPARISON:  03/28/2017 FINDINGS: Cardiomegaly with vascular congestion. Interstitial prominence and increased markings in the lung bases, most likely edema. Findings are similar prior study. Small bilateral effusions. No acute bony abnormality. IMPRESSION: Cardiomegaly with mild interstitial edema and small effusions. Findings similar to prior study. Electronically Signed   By: Rolm Baptise M.D.   On: 09/25/2017 11:09    Microbiology: No results found for this  or any previous visit (from the past 240 hour(s)).   Labs: Basic Metabolic Panel: Recent Labs  Lab 09/25/17 0951 09/26/17 0644 10/01/17 0434  NA 138 138 136  K 5.1 4.7 4.4  CL 102 102 100*  CO2 27 28 26   GLUCOSE 184* 125* 183*  BUN 26* 28* 36*  CREATININE 1.27* 1.44* 1.41*  CALCIUM 9.0 8.8* 8.6*  MG  --   --  2.1   Liver Function Tests: Recent Labs  Lab 09/25/17 0951 09/30/17 0546  AST 29 30  ALT 27 25  ALKPHOS 156* 146*  BILITOT 0.5 0.4  PROT 7.1 6.4*  ALBUMIN 4.0 3.5   No results for input(s): LIPASE, AMYLASE in the last 168 hours. No results for input(s): AMMONIA in the last 168 hours. CBC: Recent Labs  Lab 09/25/17 0951 09/26/17 0644  WBC 6.0 4.9  NEUTROABS 4.4  --   HGB 12.8 12.6  HCT 39.7 39.0  MCV 91.9 90.5  PLT 209 204   Cardiac Enzymes: Recent Labs  Lab 09/25/17 0951 09/25/17 1426 09/25/17 1836 09/25/17 2347 09/26/17 0644  TROPONINI <0.03 <0.03 <0.03 <0.03 <0.03   BNP: BNP (last 3 results) Recent Labs    10/10/16 1622 03/28/17 0610 09/25/17 1018  BNP 496.0* 338.0* 338.0*    ProBNP (last 3 results) No results for input(s): PROBNP in the last 8760 hours.  CBG: Recent Labs  Lab 09/25/17 2002 09/26/17 0823 09/26/17 1208 09/26/17 1634  GLUCAP 192* 109* 123* 141*       Signed:  Rowe Clack N  [facility] 10/01/2017, 9:49 AM

## 2017-10-01 NOTE — Progress Notes (Signed)
Pt d/c home via w/c and private automobile.  Family at front entrance of hospital to meet her.  All D/C instructions reviewed, including f/u and medication regimen.  Pt verbalizes understanding.  D/C in stable condition.

## 2017-10-01 NOTE — Care Management Important Message (Signed)
Important Message  Patient Details  Name: Kristen Garner MRN: [id number] Date of Birth: 1951/07/18   Medicare Important Message Given:  Yes    Sherald Barge, RN 10/01/2017, 10:21 AM

## 2017-10-01 NOTE — Care Management Note (Signed)
Case Management Note  Patient Details  Name: ELLEANA STILLSON MRN: [id number] Date of Birth: 10/04/1951  Expected Discharge Date:  10/01/17               Expected Discharge Plan:  Home/Self Care  In-House Referral:  NA  Discharge planning Services  CM Consult  Post Acute Care Choice:  NA Choice offered to:  NA  Status of Service:  Completed, signed off  If discussed at [place] Length of Stay Meetings, dates discussed:  10/01/2017  Additional Comments: DC home today. No CM needs. Reminded of [facility] calls. Flyer given.   Sherald Barge, RN 10/01/2017, 10:20 AM

## 2017-10-04 ENCOUNTER — Emergency Department (HOSPITAL_COMMUNITY): Payer: PPO

## 2017-10-04 ENCOUNTER — Other Ambulatory Visit: Payer: Self-pay

## 2017-10-04 ENCOUNTER — Encounter (HOSPITAL_COMMUNITY): Payer: Self-pay

## 2017-10-04 ENCOUNTER — Observation Stay (HOSPITAL_COMMUNITY)
Admission: EM | Admit: 2017-10-04 | Discharge: 2017-10-07 | Disposition: A | Discharge status: Home / Self Care | Payer: PPO | Attending: Internal Medicine | Admitting: Internal Medicine

## 2017-10-04 DIAGNOSIS — R1010 Upper abdominal pain, unspecified: Secondary | ICD-10-CM | POA: Diagnosis not present

## 2017-10-04 DIAGNOSIS — Z79899 Other long term (current) drug therapy: Secondary | ICD-10-CM | POA: Insufficient documentation

## 2017-10-04 DIAGNOSIS — R0602 Shortness of breath: Principal | ICD-10-CM | POA: Insufficient documentation

## 2017-10-04 DIAGNOSIS — E1165 Type 2 diabetes mellitus with hyperglycemia: Secondary | ICD-10-CM | POA: Diagnosis present

## 2017-10-04 DIAGNOSIS — R0609 Other forms of dyspnea: Secondary | ICD-10-CM | POA: Insufficient documentation

## 2017-10-04 DIAGNOSIS — Z87891 Personal history of nicotine dependence: Secondary | ICD-10-CM | POA: Diagnosis not present

## 2017-10-04 DIAGNOSIS — Z7982 Long term (current) use of aspirin: Secondary | ICD-10-CM | POA: Insufficient documentation

## 2017-10-04 DIAGNOSIS — I509 Heart failure, unspecified: Secondary | ICD-10-CM | POA: Diagnosis not present

## 2017-10-04 DIAGNOSIS — I130 Hypertensive heart and chronic kidney disease with heart failure and stage 1 through stage 4 chronic kidney disease, or unspecified chronic kidney disease: Secondary | ICD-10-CM | POA: Diagnosis not present

## 2017-10-04 DIAGNOSIS — I2510 Atherosclerotic heart disease of native coronary artery without angina pectoris: Secondary | ICD-10-CM | POA: Insufficient documentation

## 2017-10-04 DIAGNOSIS — K5900 Constipation, unspecified: Secondary | ICD-10-CM | POA: Diagnosis not present

## 2017-10-04 DIAGNOSIS — N183 Chronic kidney disease, stage 3 (moderate): Secondary | ICD-10-CM | POA: Diagnosis not present

## 2017-10-04 DIAGNOSIS — I5043 Acute on chronic combined systolic (congestive) and diastolic (congestive) heart failure: Secondary | ICD-10-CM | POA: Diagnosis not present

## 2017-10-04 DIAGNOSIS — Z7984 Long term (current) use of oral hypoglycemic drugs: Secondary | ICD-10-CM | POA: Diagnosis not present

## 2017-10-04 DIAGNOSIS — I471 Supraventricular tachycardia: Secondary | ICD-10-CM | POA: Diagnosis not present

## 2017-10-04 DIAGNOSIS — I10 Essential (primary) hypertension: Secondary | ICD-10-CM | POA: Diagnosis present

## 2017-10-04 DIAGNOSIS — E785 Hyperlipidemia, unspecified: Secondary | ICD-10-CM | POA: Insufficient documentation

## 2017-10-04 DIAGNOSIS — Z7901 Long term (current) use of anticoagulants: Secondary | ICD-10-CM | POA: Diagnosis not present

## 2017-10-04 DIAGNOSIS — R079 Chest pain, unspecified: Secondary | ICD-10-CM | POA: Diagnosis not present

## 2017-10-04 LAB — CBC WITH DIFFERENTIAL/PLATELET
BASOS ABS: 0 10*3/uL (ref 0.0–0.1)
BASOS PCT: 0 %
EOS ABS: 0.2 10*3/uL (ref 0.0–0.7)
Eosinophils Relative: 2 %
HCT: 40 % (ref 36.0–46.0)
HEMOGLOBIN: 13.4 g/dL (ref 12.0–15.0)
Lymphocytes Relative: 8 %
Lymphs Abs: 0.7 10*3/uL (ref 0.7–4.0)
MCH: 30.3 pg (ref 26.0–34.0)
MCHC: 33.5 g/dL (ref 30.0–36.0)
MCV: 90.5 fL (ref 78.0–100.0)
MONOS PCT: 5 %
Monocytes Absolute: 0.5 10*3/uL (ref 0.1–1.0)
NEUTROS PCT: 85 %
Neutro Abs: 7.5 10*3/uL (ref 1.7–7.7)
PLATELETS: 307 10*3/uL (ref 150–400)
RBC: 4.42 MIL/uL (ref 3.87–5.11)
RDW: 14 % (ref 11.5–15.5)
WBC: 8.8 10*3/uL (ref 4.0–10.5)

## 2017-10-04 LAB — TROPONIN I

## 2017-10-04 LAB — COMPREHENSIVE METABOLIC PANEL
ALT: 24 U/L (ref 14–54)
ANION GAP: 10 (ref 5–15)
AST: 21 U/L (ref 15–41)
Albumin: 4.9 g/dL (ref 3.5–5.0)
Alkaline Phosphatase: 160 U/L — ABNORMAL HIGH (ref 38–126)
BUN: 25 mg/dL — ABNORMAL HIGH (ref 6–20)
CHLORIDE: 98 mmol/L — AB (ref 101–111)
CO2: 27 mmol/L (ref 22–32)
CREATININE: 1.25 mg/dL — AB (ref 0.44–1.00)
Calcium: 9.6 mg/dL (ref 8.9–10.3)
GFR, EST AFRICAN AMERICAN: 51 mL/min — AB (ref >60)
GFR, EST NON AFRICAN AMERICAN: 44 mL/min — AB (ref >60)
Glucose, Bld: 204 mg/dL — ABNORMAL HIGH (ref 65–99)
POTASSIUM: 4.2 mmol/L (ref 3.5–5.1)
SODIUM: 135 mmol/L (ref 135–145)
Total Bilirubin: 0.9 mg/dL (ref 0.3–1.2)
Total Protein: 8.9 g/dL — ABNORMAL HIGH (ref 6.5–8.1)

## 2017-10-04 LAB — URINALYSIS, ROUTINE W REFLEX MICROSCOPIC
BILIRUBIN URINE: NEGATIVE
GLUCOSE, UA: NEGATIVE mg/dL
Hgb urine dipstick: NEGATIVE
KETONES UR: NEGATIVE mg/dL
NITRITE: NEGATIVE
Protein, ur: NEGATIVE mg/dL
SPECIFIC GRAVITY, URINE: 1.02 (ref 1.005–1.030)
pH: 5 (ref 5.0–8.0)

## 2017-10-04 LAB — LIPASE, BLOOD: Lipase: 27 U/L (ref 11–51)

## 2017-10-04 LAB — BRAIN NATRIURETIC PEPTIDE: B NATRIURETIC PEPTIDE 5: 911 pg/mL — AB (ref 0.0–100.0)

## 2017-10-04 MED ORDER — FUROSEMIDE 10 MG/ML IJ SOLN
40.0000 mg | Freq: Four times a day (QID) | INTRAMUSCULAR | Status: DC
  Administered 2017-10-05 (×2): 40 mg via INTRAVENOUS
  Filled 2017-10-04: qty 4

## 2017-10-04 MED ORDER — ENOXAPARIN SODIUM 40 MG/0.4ML ~~LOC~~ SOLN
40.0000 mg | SUBCUTANEOUS | Status: DC
  Administered 2017-10-05 – 2017-10-06 (×3): 40 mg via SUBCUTANEOUS
  Filled 2017-10-04 (×3): qty 0.4

## 2017-10-04 MED ORDER — GI COCKTAIL ~~LOC~~
30.0000 mL | Freq: Once | ORAL | Status: AC
  Administered 2017-10-04: 30 mL via ORAL
  Filled 2017-10-04: qty 30

## 2017-10-04 MED ORDER — FUROSEMIDE 10 MG/ML IJ SOLN
40.0000 mg | Freq: Once | INTRAMUSCULAR | Status: DC
  Filled 2017-10-04: qty 4

## 2017-10-04 MED ORDER — ALBUTEROL SULFATE (2.5 MG/3ML) 0.083% IN NEBU
5.0000 mg | INHALATION_SOLUTION | Freq: Once | RESPIRATORY_TRACT | Status: AC
  Administered 2017-10-04: 5 mg via RESPIRATORY_TRACT
  Filled 2017-10-04: qty 6

## 2017-10-04 NOTE — ED Provider Notes (Signed)
[facility] EMERGENCY DEPARTMENT Provider Note   CSN: [id number] Arrival date & time: 10/04/17  1719     History   Chief Complaint Chief Complaint  Patient presents with  . Shortness of Breath    HPI Kristen Garner is a 66 y.o. female.   Shortness of Breath     Pt was seen at 2000.  Per pt, c/o gradual onset and worsening of persistent SOB since she was discharged from the hospital for the same symptoms 3 days ago.  Describes her symptoms as "like when I was admitted." Pt cannot recall if she had any medication changes. Has been associated with upper abd "aching." States she "was constipated" but had a soft BM today. Denies CP/palpitations, no back pain, no N/V/D, no fevers, no rash.    Past Medical History:  Diagnosis Date  . Abnormal chest CT    a. CTA 09/2016: ""1.4 cm aortopulmonary window node seen, of uncertain significance"  . Anxiety   . Arthritis   . Breast cancer ([place])    right  . Breast cancer ([place])   . CAD (coronary artery disease)    a. 09/2016: cath showing 3-v disease with severe stenosis along RCA, CTO of PL branch, mild LAD stenosis, and moderate D1 stenosis. S/p DES x2 to RCA on 10/14/2016.  Marland Kitchen Chronic systolic CHF (congestive heart failure) ([place])    a. 09/2016: Echo showing EF of 20-25% with diffuse HK  . CKD (chronic kidney disease), stage III ([place])   . Depression   . Diabetes mellitus    takes only the pill  . Headache(784.0)    occiasional migraine  . Hyperlipidemia   . Hypertension   . Hypokalemia   . Noncompliance 01/04/2015  . Osteopenia 01/04/2015  . PAT (paroxysmal atrial tachycardia) ([place])   . PVC's (premature ventricular contractions)   . Shingles   . Skin cancer 2015   right leg    Patient Active Problem List   Diagnosis Date Noted  . Tachycardia 09/28/2017  . Dyspnea 09/25/2017  . Atherosclerotic heart disease of native coronary artery with other forms of angina pectoris ([place]) 10/14/2016  . Atrial tachycardia ([place])  10/14/2016  . Unstable angina ([place])   . Cardiomyopathy, ischemic   . Elevated troponin   . Acute on chronic combined systolic and diastolic CHF (congestive heart failure) ([facility])     Class: Acute  . SOB (shortness of breath) 10/10/2016  . Unifocal PVCs 10/10/2016  . GERD (gastroesophageal reflux disease) 10/10/2016  . Troponin level elevated 10/10/2016  . Osteopenia 01/04/2015  . Noncompliance 01/04/2015  . UTI (urinary tract infection) 04/05/2013  . Acute renal failure ([place]) 04/04/2013  . Diarrhea 04/04/2013  . Dehydration 04/04/2013  . Hypokalemia 04/04/2013  . Hyponatremia 04/04/2013  . Metabolic acidosis 03/00/9233  . Leucocytosis 04/04/2013  . Diabetes ([place]) 04/04/2013  . Essential hypertension 04/04/2013  . Infiltrating ductal carcinoma of breast ([place]) 10/05/2012  . HEARTBURN 08/23/2010  . ANXIETY DEPRESSION 07/15/2010  . H/O atrial tachycardia 07/15/2010    Past Surgical History:  Procedure Laterality Date  . ABDOMINAL HYSTERECTOMY    . BREAST BIOPSY Right    biopsy then wide excision   . CHOLECYSTECTOMY    . CORONARY ANGIOPLASTY WITH STENT PLACEMENT  10/14/2016  . PARTIAL MASTECTOMY WITH AXILLARY SENTINEL LYMPH NODE BIOPSY    . SENTINEL LYMPH NODE BIOPSY    . SKIN CANCER EXCISION Right 2015    OB History    No data available  Home Medications    Prior to Admission medications   Medication Sig Start Date End Date Taking? Authorizing Provider  acyclovir (ZOVIRAX) 400 MG tablet Take 1 tablet (400 mg total) by mouth 5 (five) times daily. For 10 days Patient not taking: Reported on 09/25/2017 07/14/17   Triplett, Tammy, PA-C  albuterol (PROVENTIL HFA;VENTOLIN HFA) 108 (90 Base) MCG/ACT inhaler Inhale 2 puffs into the lungs every 4 (four) hours as needed for wheezing or shortness of breath. 03/28/17   Orpah Greek, MD  ALPRAZolam Duanne Moron) 1 MG tablet Take 1 mg by mouth 2 (two) times daily.  09/16/16   [provider]  amiodarone (PACERONE) 200  MG tablet Take 1 tablet (200 mg total) 2 (two) times daily by mouth. 10/01/17   Kinnie Feil, MD  aspirin EC 81 MG EC tablet Take 1 tablet (81 mg total) by mouth daily. 10/16/16   Strader, Fransisco Hertz, PA-C  clopidogrel (PLAVIX) 75 MG tablet TAKE 1 TABLET BY MOUTH EVERY DAY 09/22/17   Herminio Commons, MD  dicyclomine (BENTYL) 20 MG tablet Take one every 6 hours as needed for abdominal cramps 01/23/17   Milton Ferguson, MD  furosemide (LASIX) 40 MG tablet Take 0.5 tablets (20 mg total) by mouth daily. 11/12/16   Deboraha Sprang, MD  HYDROcodone-acetaminophen (NORCO/VICODIN) 5-325 MG tablet Take one tab po q 6 hrs prn pain Patient taking differently: Take 1-2 tablets by mouth every 6 (six) hours as needed for moderate pain. Take one tab po q 6 hrs prn pain 07/12/17   Triplett, Tammy, PA-C  methocarbamol (ROBAXIN) 500 MG tablet Take 1 tablet (500 mg total) by mouth 3 (three) times daily. 07/12/17   Triplett, Tammy, PA-C  metoprolol succinate (TOPROL-XL) 100 MG 24 hr tablet Take 1 tablet (100 mg total) 2 (two) times daily by mouth. Take with or immediately following a meal. 10/01/17   Buriev, Arie Sabina, MD  mometasone (NASONEX) 50 MCG/ACT nasal spray Place 2 sprays into the nose daily. 03/28/17   Orpah Greek, MD  nitroGLYCERIN (NITROSTAT) 0.4 MG SL tablet Place 0.4 mg under the tongue every 5 (five) minutes as needed for chest pain.    [provider]  pantoprazole (PROTONIX) 40 MG tablet Take 1 tablet (40 mg total) by mouth daily. 11/12/16   Deboraha Sprang, MD  Potassium Chloride ER 20 MEQ TBCR Take 20 mEq by mouth daily. 11/12/16   Deboraha Sprang, MD  rosuvastatin (CRESTOR) 5 MG tablet Take 1 tablet (5 mg total) by mouth daily. 03/17/17 09/25/17  Herminio Commons, MD  zolpidem (AMBIEN) 10 MG tablet Take 10 mg by mouth at bedtime.    [provider]    Family History Family History  Problem Relation Age of Onset  . Diabetes Father   . Stroke Maternal Grandmother      Social History Social History   Tobacco Use  . Smoking status: Former Research scientist (life sciences)  . Smokeless tobacco: Never Used  Substance Use Topics  . Alcohol use: No  . Drug use: No     Allergies   Adhesive [tape]; Codeine; Penicillins; Sulfonamide derivatives; and Iodine   Review of Systems  ROS: Statement: All systems negative except as marked or noted in the HPI; Constitutional: Negative for fever and chills. ; ; Eyes: Negative for eye pain, redness and discharge. ; ; ENMT: Negative for ear pain, hoarseness, nasal congestion, sinus pressure and sore throat. ; ; Cardiovascular: Negative for chest pain, palpitations, diaphoresis, and peripheral edema. ; ;  Respiratory: +SOB. Negative for cough, wheezing and stridor. ; ; Gastrointestinal: +abd pain. Negative for nausea, vomiting, diarrhea, blood in stool, hematemesis, jaundice and rectal bleeding. . ; ; Genitourinary: Negative for dysuria, flank pain and hematuria. ; ; Musculoskeletal: Negative for back pain and neck pain. Negative for swelling and trauma.; ; Skin: Negative for pruritus, rash, abrasions, blisters, bruising and skin lesion.; ; Neuro: Negative for headache, lightheadedness and neck stiffness. Negative for weakness, altered level of consciousness, altered mental status, extremity weakness, paresthesias, involuntary movement, seizure and syncope.       Physical Exam Updated Vital Signs BP 140/83   Pulse (!) 106   Temp 97.6 F (36.4 C) (Oral)   Resp 20   Ht 5\' 6"  (1.676 m)   Wt 81.6 kg (180 lb)   SpO2 100%   BMI 29.05 kg/m   Physical Exam 2005: Physical examination:  Nursing notes reviewed; Vital signs and O2 SAT reviewed;  Constitutional: Well developed, Well nourished, Well hydrated, In no acute distress; Head:  Normocephalic, atraumatic; Eyes: EOMI, PERRL, No scleral icterus; ENMT: Mouth and pharynx normal, Mucous membranes moist; Neck: Supple, Full range of motion, No lymphadenopathy; Cardiovascular: Tachycardic rate and  rhythm, No gallop; Respiratory: Breath sounds coarse & equal bilaterally, No wheezes.  Speaking full sentences with ease, Normal respiratory effort/excursion; Chest: Nontender, Movement normal; Abdomen: Soft, +mid-epigastric tenderness to palp. No rebound or guarding. Nondistended, Normal bowel sounds; Genitourinary: No CVA tenderness; Extremities: Pulses normal, No tenderness, No edema, No calf edema or asymmetry.; Neuro: AA&Ox3, Major CN grossly intact.  Speech clear. No gross focal motor or sensory deficits in extremities. Climbs on and off stretcher easily by herself. Gait steady..; Skin: Color normal, Warm, Dry.   ED Treatments / Results  Labs (all labs ordered are listed, but only abnormal results are displayed)   EKG  EKG Interpretation  Date/Time:  Sunday October 04 2017 17:49:36 EST Ventricular Rate:  96 PR Interval:    QRS Duration: 160 QT Interval:  434 QTC Calculation: 548 R Axis:   33 Text Interpretation:  Sinus rhythm with Premature supraventricular complexes Left bundle branch block When compared with ECG of 09/28/2017 No significant change was found Confirmed by Francine Graven [phone]) on 10/04/2017 8:27:18 PM       Radiology   Procedures Procedures (including critical care time)  Medications Ordered in ED Medications  gi cocktail (Maalox,Lidocaine,Donnatal) (not administered)  albuterol (PROVENTIL) (2.5 MG/3ML) 0.083% nebulizer solution 5 mg (5 mg Nebulization Given 10/04/17 2020)     Initial Impression / Assessment and Plan / ED Course  I have reviewed the triage vital signs and the nursing notes.  Pertinent labs & imaging results that were available during my care of the patient were reviewed by me and considered in my medical decision making (see chart for details).  MDM Reviewed: previous chart, nursing note and vitals Reviewed previous: labs and ECG Interpretation: labs, ECG and x-ray   Results for orders placed or performed during the hospital  encounter of 10/04/17  Comprehensive metabolic panel  Result Value Ref Range   Sodium 135 135 - 145 mmol/L   Potassium 4.2 3.5 - 5.1 mmol/L   Chloride 98 (L) 101 - 111 mmol/L   CO2 27 22 - 32 mmol/L   Glucose, Bld 204 (H) 65 - 99 mg/dL   BUN 25 (H) 6 - 20 mg/dL   Creatinine, Ser 1.25 (H) 0.44 - 1.00 mg/dL   Calcium 9.6 8.9 - 10.3 mg/dL   Total Protein 8.9 (H)  6.5 - 8.1 g/dL   Albumin 4.9 3.5 - 5.0 g/dL   AST 21 15 - 41 U/L   ALT 24 14 - 54 U/L   Alkaline Phosphatase 160 (H) 38 - 126 U/L   Total Bilirubin 0.9 0.3 - 1.2 mg/dL   GFR calc non Af Amer 44 (L) >60 mL/min   GFR calc Af Amer 51 (L) >60 mL/min   Anion gap 10 5 - 15  Lipase, blood  Result Value Ref Range   Lipase 27 11 - 51 U/L  Brain natriuretic peptide  Result Value Ref Range   B Natriuretic Peptide 911.0 (H) 0.0 - 100.0 pg/mL  Troponin I  Result Value Ref Range   Troponin I <0.03 <0.03 ng/mL  CBC with Differential  Result Value Ref Range   WBC 8.8 4.0 - 10.5 K/uL   RBC 4.42 3.87 - 5.11 MIL/uL   Hemoglobin 13.4 12.0 - 15.0 g/dL   HCT 40.0 36.0 - 46.0 %   MCV 90.5 78.0 - 100.0 fL   MCH 30.3 26.0 - 34.0 pg   MCHC 33.5 30.0 - 36.0 g/dL   RDW 14.0 11.5 - 15.5 %   Platelets 307 150 - 400 K/uL   Neutrophils Relative % 85 %   Neutro Abs 7.5 1.7 - 7.7 K/uL   Lymphocytes Relative 8 %   Lymphs Abs 0.7 0.7 - 4.0 K/uL   Monocytes Relative 5 %   Monocytes Absolute 0.5 0.1 - 1.0 K/uL   Eosinophils Relative 2 %   Eosinophils Absolute 0.2 0.0 - 0.7 K/uL   Basophils Relative 0 %   Basophils Absolute 0.0 0.0 - 0.1 K/uL  Urinalysis, Routine w reflex microscopic  Result Value Ref Range   Color, Urine YELLOW YELLOW   APPearance HAZY (A) CLEAR   Specific Gravity, Urine 1.020 1.005 - 1.030   pH 5.0 5.0 - 8.0   Glucose, UA NEGATIVE NEGATIVE mg/dL   Hgb urine dipstick NEGATIVE NEGATIVE   Bilirubin Urine NEGATIVE NEGATIVE   Ketones, ur NEGATIVE NEGATIVE mg/dL   Protein, ur NEGATIVE NEGATIVE mg/dL   Nitrite NEGATIVE NEGATIVE    Leukocytes, UA LARGE (A) NEGATIVE   RBC / HPF 0-5 0 - 5 RBC/hpf   WBC, UA TOO NUMEROUS TO COUNT 0 - 5 WBC/hpf   Bacteria, UA RARE (A) NONE SEEN   Squamous Epithelial / LPF 0-5 (A) NONE SEEN   Mucus PRESENT    Hyaline Casts, UA PRESENT    Dg Chest 2 View Result Date: 10/04/2017 CLINICAL DATA:  66 y/o F; chest pain. History of breast cancer, bronchitis, tachycardia. EXAM: CHEST  2 VIEW COMPARISON:  09/25/2017 chest radiograph FINDINGS: Stable cardiomegaly. Aortic atherosclerosis with calcification. Diffuse increased interstitial opacities, bibasilar consolidations, and small bilateral effusions. No acute osseous abnormality is evident. IMPRESSION: Increased pulmonary edema and small bilateral pleural effusions. Bibasilar opacities probably represent associated atelectasis. Electronically Signed   By: Kristine Garbe M.D.   On: 10/04/2017 18:30   Dg Abd 2 Views Result Date: 10/04/2017 CLINICAL DATA:  Acute onset of shortness of breath and upper abdominal pain. Constipation. EXAM: ABDOMEN - 2 VIEW COMPARISON:  Abdominal radiograph performed 01/23/2017 FINDINGS: The visualized bowel gas pattern is unremarkable. Scattered air and stool filled loops of colon are seen; no abnormal dilatation of small bowel loops is seen to suggest small bowel obstruction. No free intra-abdominal air is identified on the provided upright view. The visualized osseous structures are within normal limits; the sacroiliac joints are unremarkable in appearance. Small  bilateral pleural effusions are noted. Associated bibasilar opacities may reflect atelectasis or pneumonia. IMPRESSION: 1. Unremarkable bowel gas pattern; no free intra-abdominal air seen. Small to moderate amount of stool noted in the colon. 2. Small bilateral pleural effusions, with associated bibasilar opacities possibly reflecting atelectasis or pneumonia. Electronically Signed   By: Garald Balding M.D.   On: 10/04/2017 22:50    Results for LAPORSHIA, HOGEN (MRN [id number]) as of 10/04/2017 22:14  Ref. Range 10/10/2016 16:22 03/28/2017 06:10 09/25/2017 10:18 10/04/2017 20:20  B Natriuretic Peptide Latest Ref Range: 0.0 - 100.0 pg/mL 496.0 (H) 338.0 (H) 338.0 (H) 911.0 (H)     2315:  No clear UTI on Udip; UC pending. Pt's d/c weight 3 days ago was 80.7 kg (178#); pt is 81.6 kg (180#) today. IV lasix given. Pt ambulated with Sats dropping to 92% R/A and pt needing to stop and rest after a short distance for increasing SOB.  When asked what her medications are (ie: "water pill," "heart pill") pt does not know what they are, if they have been changed (dosage, med itself), etc. Concern regarding sending pt home tonight. T/C to [facility] Dr. [name] Billet, case discussed, including:  HPI, pertinent PM/SHx, VS/PE, dx testing, ED course and treatment:  Agreeable to observation admit overnight for CM consult and meds optimization.     Final Clinical Impressions(s) / ED Diagnoses   Final diagnoses:  None    ED Discharge Orders    None        Francine Graven, DO 10/06/17 1034

## 2017-10-04 NOTE — H&P (Signed)
History and Physical    Kristen Garner HWE:[id number] DOB: 25-Mar-1951   DOA: 10/04/2017   PCP: Sharilyn Sites, MD    Chief Complaint: shortness of breath HPI:   The patient is a 66 yo woman with CHF systolic and diastolic (EF 89-38% on 08/24/74), DM2, history of breast cancer, recently admitted 09/25/17 - 10/01/17 with atrial tachycardia, acute on chronic combined systolic and diastolic CHF, LBBB, who had resolution of her symptoms at discharge but then the next day started having shortness of breath again. It was worse with any exertion. She has not noticed weight gain but has noticed abdominal fullness/bloating and leg swelling. She does not think she had any salty foods since being at home.   Onset: This episode started 2 days ago. Duration: intermittent.  Character: Couldn't get a good breath. Alleviated by: Nothing. Exacerbated by: any exertion. Associated Symptoms: Shortness of breath. Occasional cough. Wheezing. No chest pain currently. No palpitations. Has nausea, bloating, epigastric abdominal discomfort. Severe fatigue since having new higher metoprolol dose at home. Noticed a slight tremor in her hands. Treatments: none at home except usual medications.  Regarding abdominal discomfort:  Onset: more than a week ago. Not as bad currently. Duration: intermittent.  Location: epigastric area. Radiation: to lower abdomen. Character: Up to 6/10 at times but not as severe now. Burning and sensation of bloating. Alleviated by: Nothing. Exacerbated by: Nothing. Associated Symptoms: Nausea. No vomiting, diarrhea, constipation, or bloody stool. Treatments: none at home except usual medications.    Past Medical History:  Diagnosis Date  . Abnormal chest CT    a. CTA 09/2016: ""1.4 cm aortopulmonary window node seen, of uncertain significance"  . Anxiety   . Arthritis   . Breast cancer ([place])    right  . Breast cancer ([place])   . CAD (coronary artery disease)    a. 09/2016:  cath showing 3-v disease with severe stenosis along RCA, CTO of PL branch, mild LAD stenosis, and moderate D1 stenosis. S/p DES x2 to RCA on 10/14/2016.  Marland Kitchen Chronic systolic CHF (congestive heart failure) ([place])    a. 09/2016: Echo showing EF of 20-25% with diffuse HK  . CKD (chronic kidney disease), stage III ([place])   . Depression   . Diabetes mellitus    takes only the pill  . Headache(784.0)    occiasional migraine  . Hyperlipidemia   . Hypertension   . Hypokalemia   . Noncompliance 01/04/2015  . Osteopenia 01/04/2015  . PAT (paroxysmal atrial tachycardia) ([place])   . PVC's (premature ventricular contractions)   . Shingles   . Skin cancer 2015   right leg    Past Surgical History:  Procedure Laterality Date  . ABDOMINAL HYSTERECTOMY    . BREAST BIOPSY Right    biopsy then wide excision   . CHOLECYSTECTOMY    . CORONARY ANGIOPLASTY WITH STENT PLACEMENT  10/14/2016  . PARTIAL MASTECTOMY WITH AXILLARY SENTINEL LYMPH NODE BIOPSY    . SENTINEL LYMPH NODE BIOPSY    . SKIN CANCER EXCISION Right 2015    Family History  Problem Relation Age of Onset  . Diabetes Father   . Stroke Maternal Grandmother    Social History:  reports that she has quit smoking. she has never used smokeless tobacco. She reports that she does not drink alcohol or use drugs.  Allergies:  Allergies  Allergen Reactions  . Adhesive [Tape] Rash  . Codeine Itching  . Penicillins Other (See Comments)    Child hood allergy  Has patient had a PCN reaction causing immediate rash, facial/tongue/throat swelling, SOB or lightheadedness with hypotension: Unknown Has patient had a PCN reaction causing severe rash involving mucus membranes or skin necrosis: Unknown Has patient had a PCN reaction that required hospitalization: No Has patient had a PCN reaction occurring within the last 10 years: No If all of the above answers are "NO", then may proceed with Cephalosporin use.   . Sulfonamide Derivatives Itching  .  Iodine     Patient states that she got a cat scratch and her mom put iodine on it and then got catch scratch fever     No current facility-administered medications on file prior to encounter.    Current Outpatient Medications on File Prior to Encounter  Medication Sig Dispense Refill  . albuterol (PROVENTIL HFA;VENTOLIN HFA) 108 (90 Base) MCG/ACT inhaler Inhale 2 puffs into the lungs every 4 (four) hours as needed for wheezing or shortness of breath. 1 Inhaler 2  . ALPRAZolam (XANAX) 1 MG tablet Take 1 mg by mouth 2 (two) times daily.     Marland Kitchen amiodarone (PACERONE) 200 MG tablet Take 1 tablet (200 mg total) 2 (two) times daily by mouth. 60 tablet 0  . aspirin EC 81 MG EC tablet Take 1 tablet (81 mg total) by mouth daily.    . calcium carbonate (ANTACID) 420 MG CHEW chewable tablet Chew 420 mg daily as needed by mouth for indigestion or heartburn.    . clopidogrel (PLAVIX) 75 MG tablet TAKE 1 TABLET BY MOUTH EVERY DAY 30 tablet 0  . escitalopram (LEXAPRO) 10 MG tablet Take 10 mg daily by mouth.  1  . furosemide (LASIX) 40 MG tablet Take 0.5 tablets (20 mg total) by mouth daily. 15 tablet 11  . HYDROcodone-acetaminophen (NORCO/VICODIN) 5-325 MG tablet Take one tab po q 6 hrs prn pain (Patient taking differently: Take 1-2 tablets by mouth every 6 (six) hours as needed for moderate pain. Take one tab po q 6 hrs prn pain) 12 tablet 0  . metoprolol succinate (TOPROL-XL) 100 MG 24 hr tablet Take 1 tablet (100 mg total) 2 (two) times daily by mouth. Take with or immediately following a meal. 60 tablet 0  . mometasone (NASONEX) 50 MCG/ACT nasal spray Place 2 sprays into the nose daily. 17 g 12  . nitroGLYCERIN (NITROSTAT) 0.4 MG SL tablet Place 0.4 mg under the tongue every 5 (five) minutes as needed for chest pain.    Marland Kitchen Potassium Chloride ER 20 MEQ TBCR Take 20 mEq by mouth daily. (Patient taking differently: Take 20 mEq every other day by mouth. ) 30 tablet 11  . rOPINIRole (REQUIP) 1 MG tablet Take 1 mg  at bedtime by mouth.  1  . zolpidem (AMBIEN) 10 MG tablet Take 10 mg by mouth at bedtime.    Marland Kitchen acyclovir (ZOVIRAX) 400 MG tablet Take 1 tablet (400 mg total) by mouth 5 (five) times daily. For 10 days (Patient not taking: Reported on 09/25/2017) 50 tablet 0  . methocarbamol (ROBAXIN) 500 MG tablet Take 1 tablet (500 mg total) by mouth 3 (three) times daily. (Patient not taking: Reported on 10/04/2017) 21 tablet 0  . rosuvastatin (CRESTOR) 5 MG tablet Take 1 tablet (5 mg total) by mouth daily. 90 tablet 3     (Not in a hospital admission)  Results for orders placed or performed during the hospital encounter of 10/04/17 (from the past 48 hour(s))  Urinalysis, Routine w reflex microscopic     Status: Abnormal  Collection Time: 10/04/17  8:14 PM  Result Value Ref Range   Color, Urine YELLOW YELLOW   APPearance HAZY (A) CLEAR   Specific Gravity, Urine 1.020 1.005 - 1.030   pH 5.0 5.0 - 8.0   Glucose, UA NEGATIVE NEGATIVE mg/dL   Hgb urine dipstick NEGATIVE NEGATIVE   Bilirubin Urine NEGATIVE NEGATIVE   Ketones, ur NEGATIVE NEGATIVE mg/dL   Protein, ur NEGATIVE NEGATIVE mg/dL   Nitrite NEGATIVE NEGATIVE   Leukocytes, UA LARGE (A) NEGATIVE   RBC / HPF 0-5 0 - 5 RBC/hpf   WBC, UA TOO NUMEROUS TO COUNT 0 - 5 WBC/hpf   Bacteria, UA RARE (A) NONE SEEN   Squamous Epithelial / LPF 0-5 (A) NONE SEEN   Mucus PRESENT    Hyaline Casts, UA PRESENT   Comprehensive metabolic panel     Status: Abnormal   Collection Time: 10/04/17  8:19 PM  Result Value Ref Range   Sodium 135 135 - 145 mmol/L   Potassium 4.2 3.5 - 5.1 mmol/L   Chloride 98 (L) 101 - 111 mmol/L   CO2 27 22 - 32 mmol/L   Glucose, Bld 204 (H) 65 - 99 mg/dL   BUN 25 (H) 6 - 20 mg/dL   Creatinine, Ser 1.25 (H) 0.44 - 1.00 mg/dL   Calcium 9.6 8.9 - 10.3 mg/dL   Total Protein 8.9 (H) 6.5 - 8.1 g/dL   Albumin 4.9 3.5 - 5.0 g/dL   AST 21 15 - 41 U/L   ALT 24 14 - 54 U/L   Alkaline Phosphatase 160 (H) 38 - 126 U/L   Total Bilirubin 0.9  0.3 - 1.2 mg/dL   GFR calc non Af Amer 44 (L) >60 mL/min   GFR calc Af Amer 51 (L) >60 mL/min    Comment: (NOTE) The eGFR has been calculated using the CKD EPI equation. This calculation has not been validated in all clinical situations. eGFR's persistently <60 mL/min signify possible Chronic Kidney Disease.    Anion gap 10 5 - 15  Lipase, blood     Status: None   Collection Time: 10/04/17  8:19 PM  Result Value Ref Range   Lipase 27 11 - 51 U/L  Troponin I     Status: None   Collection Time: 10/04/17  8:19 PM  Result Value Ref Range   Troponin I <0.03 <0.03 ng/mL  CBC with Differential     Status: None   Collection Time: 10/04/17  8:19 PM  Result Value Ref Range   WBC 8.8 4.0 - 10.5 K/uL   RBC 4.42 3.87 - 5.11 MIL/uL   Hemoglobin 13.4 12.0 - 15.0 g/dL   HCT 40.0 36.0 - 46.0 %   MCV 90.5 78.0 - 100.0 fL   MCH 30.3 26.0 - 34.0 pg   MCHC 33.5 30.0 - 36.0 g/dL   RDW 14.0 11.5 - 15.5 %   Platelets 307 150 - 400 K/uL   Neutrophils Relative % 85 %   Neutro Abs 7.5 1.7 - 7.7 K/uL   Lymphocytes Relative 8 %   Lymphs Abs 0.7 0.7 - 4.0 K/uL   Monocytes Relative 5 %   Monocytes Absolute 0.5 0.1 - 1.0 K/uL   Eosinophils Relative 2 %   Eosinophils Absolute 0.2 0.0 - 0.7 K/uL   Basophils Relative 0 %   Basophils Absolute 0.0 0.0 - 0.1 K/uL  Brain natriuretic peptide     Status: Abnormal   Collection Time: 10/04/17  8:20 PM  Result Value Ref  Range   B Natriuretic Peptide 911.0 (H) 0.0 - 100.0 pg/mL   Dg Chest 2 View  Result Date: 10/04/2017 CLINICAL DATA:  66 y/o F; chest pain. History of breast cancer, bronchitis, tachycardia. EXAM: CHEST  2 VIEW COMPARISON:  09/25/2017 chest radiograph FINDINGS: Stable cardiomegaly. Aortic atherosclerosis with calcification. Diffuse increased interstitial opacities, bibasilar consolidations, and small bilateral effusions. No acute osseous abnormality is evident. IMPRESSION: Increased pulmonary edema and small bilateral pleural effusions. Bibasilar  opacities probably represent associated atelectasis. Electronically Signed   By: Kristine Garbe M.D.   On: 10/04/2017 18:30   Dg Abd 2 Views  Result Date: 10/04/2017 CLINICAL DATA:  Acute onset of shortness of breath and upper abdominal pain. Constipation. EXAM: ABDOMEN - 2 VIEW COMPARISON:  Abdominal radiograph performed 01/23/2017 FINDINGS: The visualized bowel gas pattern is unremarkable. Scattered air and stool filled loops of colon are seen; no abnormal dilatation of small bowel loops is seen to suggest small bowel obstruction. No free intra-abdominal air is identified on the provided upright view. The visualized osseous structures are within normal limits; the sacroiliac joints are unremarkable in appearance. Small bilateral pleural effusions are noted. Associated bibasilar opacities may reflect atelectasis or pneumonia. IMPRESSION: 1. Unremarkable bowel gas pattern; no free intra-abdominal air seen. Small to moderate amount of stool noted in the colon. 2. Small bilateral pleural effusions, with associated bibasilar opacities possibly reflecting atelectasis or pneumonia. Electronically Signed   By: Garald Balding M.D.   On: 10/04/2017 22:50    EKG, reviewed personally. 97 bpm. LBBB.  Review of Systems  Constitutional: Positive for malaise/fatigue. Negative for chills, diaphoresis and fever.  HENT: Negative for congestion, ear discharge, ear pain, nosebleeds and sore throat.   Eyes: Negative for pain and discharge.  Respiratory: Positive for cough, shortness of breath and wheezing. Negative for stridor.   Cardiovascular: Negative for chest pain and palpitations.  Gastrointestinal: Positive for abdominal pain and nausea. Negative for blood in stool, constipation, diarrhea and vomiting.  Genitourinary: Negative for dysuria and hematuria.  Musculoskeletal: Positive for joint pain. Negative for myalgias.  Skin: Negative for itching and rash.  Neurological: Positive for tremors.  Negative for sensory change, focal weakness and headaches.  Endo/Heme/Allergies: Negative for polydipsia. Does not bruise/bleed easily.  Psychiatric/Behavioral: Negative for depression. The patient is not nervous/anxious.      Physical Exam:  Blood pressure 114/68, pulse 77, temperature 97.6 F (36.4 C), temperature source Oral, resp. rate 19, height '5\' 6"'$  (1.676 m), weight 81.6 kg (180 lb), SpO2 97 %. Physical Exam  Physical Exam: GENERAL: Ill-appearing, well nourished, well-developed.  HENT: Normocephalic, atraumatic. Nares patent, without discharge or bleeding. No oropharyngeal lesions or erythema. Mucous membranes are moist.  EYES: Pupils equal, round, and reactive to light. No scleral icterus or injection. NECK: is supple, no masses, trachea midline. Thyroid non-tender and no masses appreciated. RESPIRATORY: Clear to auscultation bilaterally. Chest wall movements are symmetric. No use of accessory muscles to breathe.  No wheezing rhonchi. Decreased breath sounds in bases bilaterally. Bilateral rales. CARDIOVASCULAR: Normal S1, S2. Murmur 2/6 systolic. No rubs, or gallops. PMI non-displaced. Carotids: no carotid bruits. No bradycardia or tachycardia. DP pulses 2+ bilaterally. Capillary refill less than 3 seconds. Edema: 1+ bilaterally. GI: soft, non-distended, normal active bowel sounds. No hepatosplenomegaly. No rigidity, rebound, or guarding. Non-tender. INTEGUMENT: Clean, dry, and intact. No rashes. Mild erythema in lower legs. MUSCULOSKELETAL: Moving all extremities. No cyanosis. No clubbing. Good ROM. No contractures. Normal muscle tone. No tenderness appreciated.  NEUROLOGICAL: Cranial nerves  2-12 grossly intact. Reflexes: 2+ bilaterally. Babinski: toes downgoing bilaterally.  Motor 5/5 throughout. Sensory grossly intact to light touch. Intact rapid alternating movements bilaterally. No pronator drift.  PSYCHIATRIC: Fully oriented. Normal and appropriate affect. Normal judgement.    LYMPHATIC: No cervical lymphadenopathy. No supraclavicular lymphadenopathy.   Previous Testing:  ECHOCARDIOGRAM, 09/27/2017: Study Conclusions  - Left ventricle: The cavity size was mildly dilated. There was   mild concentric hypertrophy. Systolic function was severely   reduced. The estimated ejection fraction was in the range of 20%   to 25%. Diffuse hypokinesis. Features are consistent with a   pseudonormal left ventricular filling pattern, with concomitant   abnormal relaxation and increased filling pressure (grade 2   diastolic dysfunction). Doppler parameters are consistent with   elevated ventricular end-diastolic filling pressure. - Ventricular septum: Septal motion showed paradox. - Aortic valve: Trileaflet; mildly thickened, mildly calcified   leaflets. There was mild stenosis. Mean gradient (S): 11 mm Hg.   Peak gradient (S): 19 mm Hg. Valve area (VTI): 2.1 cm^2. Valve   area (Vmax): 2.05 cm^2. Valve area (Vmean): 1.98 cm^2. - Mitral valve: There was moderate regurgitation. - Left atrium: The atrium was moderately dilated. - Tricuspid valve: There was mild regurgitation. - Pulmonary arteries: Systolic pressure was within the normal   range. - Pericardium, extracardiac: A trivial pericardial effusion was   identified posterior to the heart. There was a left pleural   effusion. ---------------------------------------------------------------- Prepared and Electronically Authenticated by Ena Dawley, M.D. 2018-11-04T13:15:29   Assessment/Plan  Acute on chronic combined systolic and diastolic CHF (congestive heart failure) [facility]) Patient has both acute and chronic CHF. Was discharge 3 days prior to presentation on 10/04/17. Suspect she is not following instructions regarding medications, weight, and low sodium.  CHF type: Echocardiogram: EF 20-25%, diffuse hypokinesis, diastolic dysfunction, moderate mitral regurgitation. Plan: CHF order set. Daily aspirin. Beta  blocker if patient can tolerate given blood pressure and heart rate. ACE/ARB if patient can tolerate given blood pressure. Lasix IV scheduled. Strict I/O. 2g Na diet. Oxygen support by nasal cannula as needed. Statin. Nitroglycerin PRN. Morphine PRN. Instruct patient regarding CHF diagnosis. Instruct patient to weigh self daily and to contact doctor's office and/or follow doctor's plan if there is a weight gain of 2-3 lbs in 1 day or 5 lbs in 3-7 days. Provide teaching regarding 2g Na diet. May need home health.  Essential hypertension Continue home medications. Added lisinopril.  Hyperglycemia due to type 2 diabetes mellitus ([place]) Plan: Hold oral diabetes medications.  Check fingerstick blood sugars q ac and hs.  Sliding scale insulin.     Dyspnea on exertion Likely due to worsening of CHF. Plan: O2 by [place] prn. Treat CHF.      Tacey Ruiz, MD 10/04/2017, 11:38 PM

## 2017-10-04 NOTE — ED Notes (Signed)
Ambulate pt on room air PO2 ranged from 91% to 93% pt sitting recovered to 95%

## 2017-10-04 NOTE — ED Notes (Signed)
Checked with patient for urine sample.pt had already went before I could get sample,will check back in 30 minutes

## 2017-10-04 NOTE — ED Triage Notes (Signed)
Patient reports of shortness of breath for several days. Was d/c from hospital and has not improved. Also complains of abdominal pain, currently having issues with constipation.

## 2017-10-04 NOTE — Progress Notes (Signed)
Patient still out in er waiting order for neb placed before patient arrived in er.

## 2017-10-04 NOTE — ED Notes (Signed)
Respiratory notified of breathing treatment order.

## 2017-10-04 NOTE — ED Notes (Signed)
Hourly rounding   Informed of 5 hour wait   Thanked for patience,   Assured would be seen as soon as possible

## 2017-10-05 ENCOUNTER — Encounter (HOSPITAL_COMMUNITY): Payer: Self-pay | Admitting: *Deleted

## 2017-10-05 ENCOUNTER — Other Ambulatory Visit: Payer: Self-pay

## 2017-10-05 DIAGNOSIS — I509 Heart failure, unspecified: Secondary | ICD-10-CM | POA: Diagnosis not present

## 2017-10-05 DIAGNOSIS — I10 Essential (primary) hypertension: Secondary | ICD-10-CM | POA: Diagnosis not present

## 2017-10-05 DIAGNOSIS — I5043 Acute on chronic combined systolic (congestive) and diastolic (congestive) heart failure: Secondary | ICD-10-CM | POA: Diagnosis not present

## 2017-10-05 MED ORDER — CLOPIDOGREL BISULFATE 75 MG PO TABS
75.0000 mg | ORAL_TABLET | Freq: Every day | ORAL | Status: DC
  Administered 2017-10-05 – 2017-10-07 (×3): 75 mg via ORAL
  Filled 2017-10-05 (×3): qty 1

## 2017-10-05 MED ORDER — METHOCARBAMOL 500 MG PO TABS
500.0000 mg | ORAL_TABLET | Freq: Three times a day (TID) | ORAL | Status: DC | PRN
Start: 2017-10-05 — End: 2017-10-07

## 2017-10-05 MED ORDER — ASPIRIN EC 81 MG PO TBEC
81.0000 mg | DELAYED_RELEASE_TABLET | Freq: Every day | ORAL | Status: DC
  Administered 2017-10-05 – 2017-10-07 (×3): 81 mg via ORAL
  Filled 2017-10-05 (×3): qty 1

## 2017-10-05 MED ORDER — SODIUM CHLORIDE 0.9% FLUSH
3.0000 mL | Freq: Two times a day (BID) | INTRAVENOUS | Status: DC
  Administered 2017-10-05 – 2017-10-07 (×5): 3 mL via INTRAVENOUS

## 2017-10-05 MED ORDER — ZOLPIDEM TARTRATE 5 MG PO TABS
5.0000 mg | ORAL_TABLET | Freq: Every evening | ORAL | Status: DC | PRN
Start: 2017-10-05 — End: 2017-10-07
  Administered 2017-10-05 (×2): 5 mg via ORAL
  Filled 2017-10-05 (×2): qty 1

## 2017-10-05 MED ORDER — CALCIUM CARBONATE ANTACID 500 MG PO CHEW: 1.0000 | CHEWABLE_TABLET | Freq: Every day | ORAL | Status: DC | PRN

## 2017-10-05 MED ORDER — ONDANSETRON HCL 4 MG/2ML IJ SOLN: 4.0000 mg | Freq: Four times a day (QID) | INTRAMUSCULAR | Status: DC | PRN

## 2017-10-05 MED ORDER — ACETAMINOPHEN 325 MG PO TABS: 650.0000 mg | ORAL_TABLET | ORAL | Status: DC | PRN

## 2017-10-05 MED ORDER — SODIUM CHLORIDE 0.9% FLUSH: 3.0000 mL | INTRAVENOUS | Status: DC | PRN

## 2017-10-05 MED ORDER — POTASSIUM CHLORIDE CRYS ER 10 MEQ PO TBCR
20.0000 meq | EXTENDED_RELEASE_TABLET | Freq: Every day | ORAL | Status: DC
  Administered 2017-10-05 – 2017-10-07 (×3): 20 meq via ORAL
  Filled 2017-10-05 (×4): qty 2

## 2017-10-05 MED ORDER — SODIUM CHLORIDE 0.9 % IV SOLN: 250.0000 mL | INTRAVENOUS | Status: DC | PRN

## 2017-10-05 MED ORDER — ROSUVASTATIN CALCIUM 10 MG PO TABS
5.0000 mg | ORAL_TABLET | Freq: Every day | ORAL | Status: DC
  Administered 2017-10-05 – 2017-10-07 (×3): 5 mg via ORAL
  Filled 2017-10-05 (×4): qty 1

## 2017-10-05 MED ORDER — HYDROCODONE-ACETAMINOPHEN 5-325 MG PO TABS: 1.0000 | ORAL_TABLET | ORAL | Status: DC | PRN

## 2017-10-05 MED ORDER — FUROSEMIDE 10 MG/ML IJ SOLN
40.0000 mg | Freq: Two times a day (BID) | INTRAMUSCULAR | Status: DC
  Administered 2017-10-05 – 2017-10-07 (×4): 40 mg via INTRAVENOUS
  Filled 2017-10-05 (×4): qty 4

## 2017-10-05 MED ORDER — AMIODARONE HCL 200 MG PO TABS
200.0000 mg | ORAL_TABLET | Freq: Two times a day (BID) | ORAL | Status: DC
  Administered 2017-10-05 – 2017-10-07 (×6): 200 mg via ORAL
  Filled 2017-10-05 (×6): qty 1

## 2017-10-05 MED ORDER — FLUTICASONE PROPIONATE 50 MCG/ACT NA SUSP
2.0000 | Freq: Every day | NASAL | Status: DC
  Administered 2017-10-05: 2 via NASAL
  Filled 2017-10-05: qty 16

## 2017-10-05 MED ORDER — ROPINIROLE HCL 1 MG PO TABS
1.0000 mg | ORAL_TABLET | Freq: Every day | ORAL | Status: DC
  Administered 2017-10-05 – 2017-10-06 (×3): 1 mg via ORAL
  Filled 2017-10-05 (×3): qty 1

## 2017-10-05 MED ORDER — ENOXAPARIN SODIUM 40 MG/0.4ML ~~LOC~~ SOLN: 40.0000 mg | SUBCUTANEOUS | Status: DC

## 2017-10-05 MED ORDER — LISINOPRIL 5 MG PO TABS
2.5000 mg | ORAL_TABLET | Freq: Every day | ORAL | Status: DC
  Administered 2017-10-05 – 2017-10-07 (×3): 2.5 mg via ORAL
  Filled 2017-10-05 (×3): qty 1

## 2017-10-05 MED ORDER — ALPRAZOLAM 1 MG PO TABS
1.0000 mg | ORAL_TABLET | Freq: Two times a day (BID) | ORAL | Status: DC
  Administered 2017-10-05 – 2017-10-07 (×6): 1 mg via ORAL
  Filled 2017-10-05 (×6): qty 1

## 2017-10-05 MED ORDER — NITROGLYCERIN 0.4 MG SL SUBL: 0.4000 mg | SUBLINGUAL_TABLET | SUBLINGUAL | Status: DC | PRN

## 2017-10-05 MED ORDER — METOPROLOL SUCCINATE ER 50 MG PO TB24
100.0000 mg | ORAL_TABLET | Freq: Two times a day (BID) | ORAL | Status: DC
  Administered 2017-10-05 – 2017-10-07 (×5): 100 mg via ORAL
  Filled 2017-10-05: qty 1
  Filled 2017-10-05 (×5): qty 2

## 2017-10-05 MED ORDER — ESCITALOPRAM OXALATE 10 MG PO TABS
10.0000 mg | ORAL_TABLET | Freq: Every day | ORAL | Status: DC
  Administered 2017-10-05 – 2017-10-07 (×3): 10 mg via ORAL
  Filled 2017-10-05 (×3): qty 1

## 2017-10-05 MED ORDER — CALCIUM CARBONATE ANTACID 420 MG PO CHEW
420.0000 mg | CHEWABLE_TABLET | Freq: Every day | ORAL | Status: DC | PRN
Start: 2017-10-05 — End: 2017-10-05
  Filled 2017-10-05: qty 1

## 2017-10-05 NOTE — Plan of Care (Signed)
Pt had increased urinary output during shift; slept majority of night.

## 2017-10-05 NOTE — Care Management Obs Status (Signed)
[place] NOTIFICATION   Patient Details  Name: Kristen Garner MRN: [id number] Date of Birth: Nov 17, 1951   Medicare Observation Status Notification Given:  Yes    Rube Sanchez, Chauncey [place], RN 10/05/2017, 11:04 AM

## 2017-10-05 NOTE — Assessment & Plan Note (Signed)
Plan: Hold oral diabetes medications.  Check fingerstick blood sugars q ac and hs.  Sliding scale insulin.

## 2017-10-05 NOTE — Plan of Care (Signed)
Education continues toward discharge

## 2017-10-05 NOTE — Assessment & Plan Note (Signed)
Likely due to worsening of CHF. Plan: O2 by [place] prn. Treat CHF.

## 2017-10-05 NOTE — Assessment & Plan Note (Signed)
Continue home medications. Added lisinopril.

## 2017-10-05 NOTE — ED Notes (Signed)
Report to [place], RN [phone]

## 2017-10-05 NOTE — Assessment & Plan Note (Signed)
Patient has both acute and chronic CHF. Was discharge 3 days prior to presentation on 10/04/17. Suspect she is not following instructions regarding medications, weight, and low sodium.  CHF type: Echocardiogram: EF 20-25%, diffuse hypokinesis, diastolic dysfunction, moderate mitral regurgitation. Plan: CHF order set. Daily aspirin. Beta blocker if patient can tolerate given blood pressure and heart rate. ACE/ARB if patient can tolerate given blood pressure. Lasix IV scheduled. Strict I/O. 2g Na diet. Oxygen support by nasal cannula as needed. Statin. Nitroglycerin PRN. Morphine PRN. Instruct patient regarding CHF diagnosis. Instruct patient to weigh self daily and to contact doctor's office and/or follow doctor's plan if there is a weight gain of 2-3 lbs in 1 day or 5 lbs in 3-7 days. Provide teaching regarding 2g Na diet. May need home health.

## 2017-10-05 NOTE — Progress Notes (Signed)
[facility] PROGRESS NOTE  CLEMIE GENERAL VEH:[id number] DOB: [birth year] DOA: 10/04/2017 PCP: Sharilyn Sites, MD  Brief summary   66 yo woman with coronary artery disease, ckd stage III and anxiety, CHF systolic and diastolic (EF 83-66% on 29/4/76), recently admitted 09/25/17 - 10/01/17 with atrial tachycardia, acute on chronic combined systolic and diastolic CHF, LBBB, who had resolution of her symptoms at discharge but then the next day started having shortness of breath again. It was worse with any exertion. She has not noticed weight gain but has noticed abdominal fullness/bloating and leg swelling. She was recently admitted with heart failure thought due to atrial tachyarrhythmia and started on amiodarone and increased on metoprolol per cardiology   Assessment/Plan:  Acute on chronic combined systolic and diastolic CHF (congestive heart failure) ([place]). Patient has both acute and chronic CHF. recent echocardiogram: EF 20-25%, diffuse hypokinesis, diastolic dysfunction, moderate mitral regurgitation. Recent admission, thought due to tachyarrhythmias. Was discharge 3 days prior to presentation on 10/04/17. Suspect she is not following instructions regarding medications, weight, and low sodium.  -cont diuresis with iv lasix, cont aspirin. Beta blocker and ACE/ARB if patient can tolerate given blood pressure. Monitor I/O. Daily weight, monitor on tele r/o significant arrhythmias. Provide teaching regarding 2g Na diet  Atrial tachycardia. Appears controlled with Toprol XL 100 mg BID +amiodarone. TSH and LFTs normal.  CAD status post DES x2 to the RCA in November 2017. No acute pains. Cont aspirin, Plavix and statin.  GERD (gastroesophageal reflux disease). Continue PPI    Code Status: full Family Communication: d/w patient, RN (indicate person spoken with, relationship, and if by phone, the number) Disposition Plan: home 24-48 hrs     Consultants:  none  Procedures:  none  Antibiotics: Anti-infectives (From admission, onward)   None        (indicate start date, and stop date if known)  HPI/Subjective: Alert, reports feeling better.  No acute chest pains.   Objective: Vitals:   10/05/17 0127 10/05/17 0500  BP: (!) 115/55 108/64  Pulse: 70 70  Resp: 14 18  Temp: 98.2 F (36.8 C) 97.6 F (36.4 C)  SpO2: 93% 96%   No intake or output data in the 24 hours ending 10/05/17 0947 Filed Weights   10/04/17 1741 10/04/17 2354 10/05/17 0127  Weight: 81.6 kg (180 lb) 80.9 kg (178 lb 5 oz) 80.6 kg (177 lb 11.1 oz)    Exam:   General:  No distress   Cardiovascular: s1, s2 rrr  Respiratory: few crackles LL  Abdomen: soft, nt, nd no  Musculoskeletal: mild pedal edema    Data Reviewed: Basic Metabolic Panel: Recent Labs  Lab 10/01/17 0434 10/04/17 2019  NA 136 135  K 4.4 4.2  CL 100* 98*  CO2 26 27  GLUCOSE 183* 204*  BUN 36* 25*  CREATININE 1.41* 1.25*  CALCIUM 8.6* 9.6  MG 2.1  --    Liver Function Tests: Recent Labs  Lab 09/30/17 0546 10/04/17 2019  AST 30 21  ALT 25 24  ALKPHOS 146* 160*  BILITOT 0.4 0.9  PROT 6.4* 8.9*  ALBUMIN 3.5 4.9   Recent Labs  Lab 10/04/17 2019  LIPASE 27   No results for input(s): AMMONIA in the last 168 hours. CBC: Recent Labs  Lab 10/04/17 2019  WBC 8.8  NEUTROABS 7.5  HGB 13.4  HCT 40.0  MCV 90.5  PLT 307   Cardiac Enzymes: Recent Labs  Lab 10/04/17 2019  TROPONINI <0.03   BNP (last 3  results) Recent Labs    03/28/17 0610 09/25/17 1018 10/04/17 2020  BNP 338.0* 338.0* 911.0*    ProBNP (last 3 results) No results for input(s): PROBNP in the last 8760 hours.  CBG: No results for input(s): GLUCAP in the last 168 hours.  No results found for this or any previous visit (from the past 240 hour(s)).   Studies: Dg Chest 2 View  Result Date: 10/04/2017 CLINICAL DATA:  66 y/o F; chest pain. History of breast cancer,  bronchitis, tachycardia. EXAM: CHEST  2 VIEW COMPARISON:  09/25/2017 chest radiograph FINDINGS: Stable cardiomegaly. Aortic atherosclerosis with calcification. Diffuse increased interstitial opacities, bibasilar consolidations, and small bilateral effusions. No acute osseous abnormality is evident. IMPRESSION: Increased pulmonary edema and small bilateral pleural effusions. Bibasilar opacities probably represent associated atelectasis. Electronically Signed   By: Kristine Garbe M.D.   On: 10/04/2017 18:30   Dg Abd 2 Views  Result Date: 10/04/2017 CLINICAL DATA:  Acute onset of shortness of breath and upper abdominal pain. Constipation. EXAM: ABDOMEN - 2 VIEW COMPARISON:  Abdominal radiograph performed 01/23/2017 FINDINGS: The visualized bowel gas pattern is unremarkable. Scattered air and stool filled loops of colon are seen; no abnormal dilatation of small bowel loops is seen to suggest small bowel obstruction. No free intra-abdominal air is identified on the provided upright view. The visualized osseous structures are within normal limits; the sacroiliac joints are unremarkable in appearance. Small bilateral pleural effusions are noted. Associated bibasilar opacities may reflect atelectasis or pneumonia. IMPRESSION: 1. Unremarkable bowel gas pattern; no free intra-abdominal air seen. Small to moderate amount of stool noted in the colon. 2. Small bilateral pleural effusions, with associated bibasilar opacities possibly reflecting atelectasis or pneumonia. Electronically Signed   By: Garald Balding M.D.   On: 10/04/2017 22:50    Scheduled Meds: . ALPRAZolam  1 mg Oral BID  . amiodarone  200 mg Oral BID  . aspirin EC  81 mg Oral Daily  . clopidogrel  75 mg Oral Daily  . enoxaparin (LOVENOX) injection  40 mg Subcutaneous Q24H  . escitalopram  10 mg Oral Daily  . fluticasone  2 spray Each Nare Daily  . furosemide  40 mg Intravenous Q6H  . lisinopril  2.5 mg Oral Daily  . metoprolol succinate   100 mg Oral BID  . potassium chloride  20 mEq Oral Daily  . rOPINIRole  1 mg Oral QHS  . rosuvastatin  5 mg Oral Daily  . sodium chloride flush  3 mL Intravenous Q12H   Continuous Infusions: . sodium chloride      Principal Problem:   Acute on chronic combined systolic and diastolic CHF (congestive heart failure) (H[facility]) Active Problems:   Hyperglycemia due to type 2 diabetes mellitus ([place])   Essential hypertension   Dyspnea on exertion    Time spent: >35 minutes     Kinnie Feil  [facility] Pager [phone]. If 7PM-7AM, please contact night-coverage at [email or web address], password [facility] 10/05/2017, 9:47 AM  LOS: 0 days

## 2017-10-06 DIAGNOSIS — I471 Supraventricular tachycardia: Secondary | ICD-10-CM | POA: Diagnosis not present

## 2017-10-06 DIAGNOSIS — I10 Essential (primary) hypertension: Secondary | ICD-10-CM

## 2017-10-06 DIAGNOSIS — N183 Chronic kidney disease, stage 3 (moderate): Secondary | ICD-10-CM | POA: Diagnosis not present

## 2017-10-06 DIAGNOSIS — I5043 Acute on chronic combined systolic (congestive) and diastolic (congestive) heart failure: Secondary | ICD-10-CM | POA: Diagnosis not present

## 2017-10-06 LAB — BASIC METABOLIC PANEL
Anion gap: 8 (ref 5–15)
BUN: 27 mg/dL — AB (ref 6–20)
CALCIUM: 8.6 mg/dL — AB (ref 8.9–10.3)
CHLORIDE: 97 mmol/L — AB (ref 101–111)
CO2: 29 mmol/L (ref 22–32)
CREATININE: 1.54 mg/dL — AB (ref 0.44–1.00)
GFR calc non Af Amer: 34 mL/min — ABNORMAL LOW (ref >60)
GFR, EST AFRICAN AMERICAN: 39 mL/min — AB (ref >60)
Glucose, Bld: 117 mg/dL — ABNORMAL HIGH (ref 65–99)
Potassium: 3.6 mmol/L (ref 3.5–5.1)
SODIUM: 134 mmol/L — AB (ref 135–145)

## 2017-10-06 MED ORDER — BENZOCAINE 10 % MT GEL
Freq: Four times a day (QID) | OROMUCOSAL | Status: DC | PRN
Start: 2017-10-06 — End: 2017-10-07
  Administered 2017-10-07: 10:00:00 via OROMUCOSAL
  Filled 2017-10-06: qty 9

## 2017-10-06 NOTE — Progress Notes (Signed)
[facility] PROGRESS NOTE  Kristen Garner WUJ:[id number] DOB: [birth year] DOA: 10/04/2017 PCP: Sharilyn Sites, MD  Brief summary   66 yo woman with coronary artery disease, ckd stage III and anxiety, CHF systolic and diastolic (EF 95-62% on 13/0/86), recently admitted 09/25/17 - 10/01/17 with atrial tachycardia, acute on chronic combined systolic and diastolic CHF, LBBB, who had resolution of her symptoms at discharge but then the next day started having shortness of breath again. It was worse with any exertion. She has not noticed weight gain but has noticed abdominal fullness/bloating and leg swelling. She was recently admitted with heart failure thought due to atrial tachyarrhythmia and started on amiodarone and increased on metoprolol per cardiology   Assessment/Plan:  Acute on chronic combined systolic and diastolic CHF (congestive heart failure) ([place]). Patient has both acute and chronic CHF. recent echocardiogram: EF 20-25%, diffuse hypokinesis, diastolic dysfunction, moderate mitral regurgitation. Recent admission, thought due to tachyarrhythmias. Was discharged 3 days prior to readmission on 10/04/17. Suspect she is not following instructions regarding medications, weight, and low sodium.   Patient admits to dietary indiscretions. -cont diuresis with iv lasix, cont aspirin. Beta blocker and ACE/ARB if patient can tolerate given blood pressure.  Intake and output is not being accurately recorded.  Will reorder strict intake and output and daily weights.  Nutrition consult to assist with dietary education.  Chronic kidney disease stage III.  Creatinine ranging from 1.2-1.6 at baseline.  Current creatinine is at baseline.  Continue to monitor in the setting of diuresis  Atrial tachycardia. Appears controlled with Toprol XL 100 mg BID +amiodarone. TSH and LFTs normal.  CAD status post DES x2 to the RCA in November 2017. No acute pains. Cont aspirin, Plavix and statin.  GERD  (gastroesophageal reflux disease). Continue PPI   DVT prophylaxis: Lovenox Code Status: full Family Communication: d/w patient, Disposition Plan: home 24-48 hrs    Consultants:  none  Procedures:  none  Antibiotics: Anti-infectives (From admission, onward)   None        HPI/Subjective: No chest pain.  Feels shortness of breath is improving.  Objective: Vitals:   10/06/17 1008 10/06/17 1549  BP:  (!) 121/58  Pulse: 67 62  Resp:  18  Temp:  98.5 F (36.9 C)  SpO2:  96%    Intake/Output Summary (Last 24 hours) at 10/06/2017 1714 Last data filed at 10/06/2017 1009 Gross per 24 hour  Intake 3 ml  Output -  Net 3 ml   Filed Weights   10/04/17 2354 10/05/17 0127 10/06/17 0538  Weight: 80.9 kg (178 lb 5 oz) 80.6 kg (177 lb 11.1 oz) 79.3 kg (174 lb 13.2 oz)    Exam:   General:  No distress   Cardiovascular: s1, s2 rrr  Respiratory: few crackles LL  Abdomen: soft, nt, nd no  Musculoskeletal: mild pedal edema    Data Reviewed: Basic Metabolic Panel: Recent Labs  Lab 10/01/17 0434 10/04/17 2019 10/06/17 0500  NA 136 135 134*  K 4.4 4.2 3.6  CL 100* 98* 97*  CO2 26 27 29   GLUCOSE 183* 204* 117*  BUN 36* 25* 27*  CREATININE 1.41* 1.25* 1.54*  CALCIUM 8.6* 9.6 8.6*  MG 2.1  --   --    Liver Function Tests: Recent Labs  Lab 09/30/17 0546 10/04/17 2019  AST 30 21  ALT 25 24  ALKPHOS 146* 160*  BILITOT 0.4 0.9  PROT 6.4* 8.9*  ALBUMIN 3.5 4.9   Recent Labs  Lab 10/04/17 2019  LIPASE 27   No results for input(s): AMMONIA in the last 168 hours. CBC: Recent Labs  Lab 10/04/17 2019  WBC 8.8  NEUTROABS 7.5  HGB 13.4  HCT 40.0  MCV 90.5  PLT 307   Cardiac Enzymes: Recent Labs  Lab 10/04/17 2019  TROPONINI <0.03   BNP (last 3 results) Recent Labs    03/28/17 0610 09/25/17 1018 10/04/17 2020  BNP 338.0* 338.0* 911.0*    ProBNP (last 3 results) No results for input(s): PROBNP in the last 8760 hours.  CBG: No results  for input(s): GLUCAP in the last 168 hours.  No results found for this or any previous visit (from the past 240 hour(s)).   Studies: Dg Chest 2 View  Result Date: 10/04/2017 CLINICAL DATA:  66 y/o F; chest pain. History of breast cancer, bronchitis, tachycardia. EXAM: CHEST  2 VIEW COMPARISON:  09/25/2017 chest radiograph FINDINGS: Stable cardiomegaly. Aortic atherosclerosis with calcification. Diffuse increased interstitial opacities, bibasilar consolidations, and small bilateral effusions. No acute osseous abnormality is evident. IMPRESSION: Increased pulmonary edema and small bilateral pleural effusions. Bibasilar opacities probably represent associated atelectasis. Electronically Signed   By: Kristine Garbe M.D.   On: 10/04/2017 18:30   Dg Abd 2 Views  Result Date: 10/04/2017 CLINICAL DATA:  Acute onset of shortness of breath and upper abdominal pain. Constipation. EXAM: ABDOMEN - 2 VIEW COMPARISON:  Abdominal radiograph performed 01/23/2017 FINDINGS: The visualized bowel gas pattern is unremarkable. Scattered air and stool filled loops of colon are seen; no abnormal dilatation of small bowel loops is seen to suggest small bowel obstruction. No free intra-abdominal air is identified on the provided upright view. The visualized osseous structures are within normal limits; the sacroiliac joints are unremarkable in appearance. Small bilateral pleural effusions are noted. Associated bibasilar opacities may reflect atelectasis or pneumonia. IMPRESSION: 1. Unremarkable bowel gas pattern; no free intra-abdominal air seen. Small to moderate amount of stool noted in the colon. 2. Small bilateral pleural effusions, with associated bibasilar opacities possibly reflecting atelectasis or pneumonia. Electronically Signed   By: Garald Balding M.D.   On: 10/04/2017 22:50    Scheduled Meds: . ALPRAZolam  1 mg Oral BID  . amiodarone  200 mg Oral BID  . aspirin EC  81 mg Oral Daily  . clopidogrel  75  mg Oral Daily  . enoxaparin (LOVENOX) injection  40 mg Subcutaneous Q24H  . escitalopram  10 mg Oral Daily  . fluticasone  2 spray Each Nare Daily  . furosemide  40 mg Intravenous BID  . lisinopril  2.5 mg Oral Daily  . metoprolol succinate  100 mg Oral BID  . potassium chloride  20 mEq Oral Daily  . rOPINIRole  1 mg Oral QHS  . rosuvastatin  5 mg Oral Daily  . sodium chloride flush  3 mL Intravenous Q12H   Continuous Infusions: . sodium chloride      Principal Problem:   Acute on chronic combined systolic and diastolic CHF (congestive heart failure) (H[facility]) Active Problems:   Hyperglycemia due to type 2 diabetes mellitus ([place])   Essential hypertension   Dyspnea on exertion    Time spent: 25 minutes     [place] [facility] Pager [phone]. If 7PM-7AM, please contact night-coverage at [email or web address], password [facility] 10/06/2017, 5:14 PM  LOS: 0 days

## 2017-10-06 NOTE — Consult Note (Signed)
   [facility] CM Inpatient Consult   10/06/2017  Kristen Garner [birth year] [id number]   Chart review revealed patient eligible for [facility] services and post hospital discharge follow up related to a diagnosis of HF, DM and 2 admits ion 6 months. Patient was evaluated for community based chronic disease management services with [facility] care Management Program as a benefit of patient's [employer]. Met with the patient and her husband [name] at the bedside to explain [facility] services. Patient endorses her primary care provider to be Sharilyn Sites, MD. Patient states she currently does not have a bathroom scale to do daily weights. Verbal consent received for services. Patient gave [phone] as the best number to reach her. She also gave verbal permission to call her spouse [name] at [phone] who has voice mail if she can not be reached. Patient will receive post hospital discharge calls and be evaluated for monthly home visits. [facility] Care Management services does not interfere with or replace any services arranged by the inpatient care management team. RNCM left contact information and [facility] literature at the bedside. Made inpatient RNCM aware that [facility] will be following for care management. For additional questions please contact:   Monique Hefty RN, [facility] Liaison  [phone]) Business Mobile [phone]) Toll free office

## 2017-10-06 NOTE — Plan of Care (Signed)
Pt had uneventful night without signs of distress.

## 2017-10-07 ENCOUNTER — Other Ambulatory Visit: Payer: Self-pay | Admitting: *Deleted

## 2017-10-07 DIAGNOSIS — I471 Supraventricular tachycardia: Secondary | ICD-10-CM

## 2017-10-07 DIAGNOSIS — I5043 Acute on chronic combined systolic (congestive) and diastolic (congestive) heart failure: Secondary | ICD-10-CM | POA: Diagnosis not present

## 2017-10-07 DIAGNOSIS — N183 Chronic kidney disease, stage 3 (moderate): Secondary | ICD-10-CM | POA: Diagnosis not present

## 2017-10-07 DIAGNOSIS — I10 Essential (primary) hypertension: Secondary | ICD-10-CM | POA: Diagnosis not present

## 2017-10-07 LAB — BASIC METABOLIC PANEL
ANION GAP: 10 (ref 5–15)
BUN: 28 mg/dL — AB (ref 6–20)
CO2: 28 mmol/L (ref 22–32)
Calcium: 8.6 mg/dL — ABNORMAL LOW (ref 8.9–10.3)
Chloride: 94 mmol/L — ABNORMAL LOW (ref 101–111)
Creatinine, Ser: 1.82 mg/dL — ABNORMAL HIGH (ref 0.44–1.00)
GFR calc Af Amer: 32 mL/min — ABNORMAL LOW (ref >60)
GFR, EST NON AFRICAN AMERICAN: 28 mL/min — AB (ref >60)
GLUCOSE: 116 mg/dL — AB (ref 65–99)
POTASSIUM: 3.8 mmol/L (ref 3.5–5.1)
Sodium: 132 mmol/L — ABNORMAL LOW (ref 135–145)

## 2017-10-07 MED ORDER — ROSUVASTATIN CALCIUM 5 MG PO TABS: 5.0000 mg | ORAL_TABLET | Freq: Every day | ORAL | 3 refills | Status: DC

## 2017-10-07 MED ORDER — CLOPIDOGREL BISULFATE 75 MG PO TABS: 75.0000 mg | ORAL_TABLET | Freq: Every day | ORAL | 0 refills | Status: DC

## 2017-10-07 MED ORDER — METOPROLOL SUCCINATE ER 100 MG PO TB24
100.0000 mg | ORAL_TABLET | Freq: Two times a day (BID) | ORAL | 0 refills | Status: DC
Start: 2017-10-07 — End: 2023-02-06

## 2017-10-07 MED ORDER — AMIODARONE HCL 200 MG PO TABS: 200.0000 mg | ORAL_TABLET | Freq: Two times a day (BID) | ORAL | 0 refills | Status: DC

## 2017-10-07 MED ORDER — FUROSEMIDE 40 MG PO TABS: 40.0000 mg | ORAL_TABLET | Freq: Every day | ORAL | 11 refills | Status: AC

## 2017-10-07 NOTE — Progress Notes (Signed)
Nutrition Education Note  RD consulted for nutrition education regarding onset CHF.  RD provided "Heart Failure Nutrition Therapy" handout from the Academy of [employer] and Dietetics. Reviewed patient's dietary recall. Provided examples on ways to decrease sodium intake in diet. Discouraged intake of processed foods and use of salt shaker. Encouraged fresh fruits and vegetables as well as whole grain sources of carbohydrates to maximize fiber intake.   RD discussed why it is important for patient to adhere to diet recommendations, and emphasized the role of fluids, foods to avoid, and importance of weighing self daily. Teach back method used. The patient needs scales and her nurse is follow up with case manager.  Expect good compliance. No barriers identified.  Body mass index is 28.68 kg/m. Pt meets criteria for overweight based on current BMI.  Current diet order is heart healthy, patient is consuming approximately 50-75% of meals at this time. Labs and medications reviewed. No further nutrition interventions warranted at this time. RD contact information provided. If additional nutrition issues arise, please re-consult RD.   Colman Cater MS,RD,CSG,LDN Office: [phone] Pager: [phone]

## 2017-10-07 NOTE — ACP (Advance Care Planning) (Signed)
Patient was sleeping. I left Advance Directive information along with contact information on her bedside table. Plan to follow up tomorrow.

## 2017-10-07 NOTE — Progress Notes (Signed)
NURSING PROGRESS NOTE  MEGON KALINA [id number] Discharge Data: 10/07/2017 6:17 PM Attending Provider: Kathie Dike, MD [id number], Jenny Reichmann, MD     Carolyn Stare to be D/C'd Home per MD order.  Discussed with the patient the After Visit Summary and all questions fully answered. All IV's discontinued with no bleeding noted. All belongings returned to patient for patient to take home.   Last Vital Signs:  Blood pressure 114/62, pulse 61, temperature 98.6 F (37 C), temperature source Oral, resp. rate 16, height 5\' 6"  (1.676 m), weight 80.6 kg (177 lb 11.1 oz), SpO2 96 %.  Discharge Medication List Allergies as of 10/07/2017      Reactions   Adhesive [tape] Rash   Codeine Itching   Penicillins Other (See Comments)   Child hood allergy Has patient had a PCN reaction causing immediate rash, facial/tongue/throat swelling, SOB or lightheadedness with hypotension: Unknown Has patient had a PCN reaction causing severe rash involving mucus membranes or skin necrosis: Unknown Has patient had a PCN reaction that required hospitalization: No Has patient had a PCN reaction occurring within the last 10 years: No If all of the above answers are "NO", then may proceed with Cephalosporin use.   Sulfonamide Derivatives Itching   Iodine    Patient states that she got a cat scratch and her mom put iodine on it and then got catch scratch fever      Medication List    STOP taking these medications   acyclovir 400 MG tablet Commonly known as:  ZOVIRAX     TAKE these medications   albuterol 108 (90 Base) MCG/ACT inhaler Commonly known as:  PROVENTIL HFA;VENTOLIN HFA Inhale 2 puffs into the lungs every 4 (four) hours as needed for wheezing or shortness of breath.   ALPRAZolam 1 MG tablet Commonly known as:  XANAX Take 1 mg by mouth 2 (two) times daily.   amiodarone 200 MG tablet Commonly known as:  PACERONE Take 1 tablet (200 mg total) 2 (two) times daily by mouth.   ANTACID 420 MG  Chew chewable tablet Generic drug:  calcium carbonate Chew 420 mg daily as needed by mouth for indigestion or heartburn.   aspirin 81 MG EC tablet Take 1 tablet (81 mg total) by mouth daily.   clopidogrel 75 MG tablet Commonly known as:  PLAVIX Take 1 tablet (75 mg total) daily by mouth.   escitalopram 10 MG tablet Commonly known as:  LEXAPRO Take 10 mg daily by mouth.   furosemide 40 MG tablet Commonly known as:  LASIX Take 1 tablet (40 mg total) daily by mouth. What changed:  how much to take   HYDROcodone-acetaminophen 5-325 MG tablet Commonly known as:  NORCO/VICODIN Take one tab po q 6 hrs prn pain What changed:    how much to take  how to take this  when to take this  reasons to take this  additional instructions   methocarbamol 500 MG tablet Commonly known as:  ROBAXIN Take 1 tablet (500 mg total) by mouth 3 (three) times daily.   metoprolol succinate 100 MG 24 hr tablet Commonly known as:  TOPROL-XL Take 1 tablet (100 mg total) 2 (two) times daily by mouth. Take with or immediately following a meal.   mometasone 50 MCG/ACT nasal spray Commonly known as:  NASONEX Place 2 sprays into the nose daily.   nitroGLYCERIN 0.4 MG SL tablet Commonly known as:  NITROSTAT Place 0.4 mg under the tongue every 5 (five) minutes as needed for  chest pain.   Potassium Chloride ER 20 MEQ Tbcr Take 20 mEq by mouth daily. What changed:  when to take this   rOPINIRole 1 MG tablet Commonly known as:  REQUIP Take 1 mg at bedtime by mouth.   rosuvastatin 5 MG tablet Commonly known as:  CRESTOR Take 1 tablet (5 mg total) daily by mouth.   zolpidem 10 MG tablet Commonly known as:  AMBIEN Take 10 mg by mouth at bedtime.

## 2017-10-07 NOTE — Patient Outreach (Signed)
[facility]) Care Management  10/07/2017  CINDI GHAZARIAN 1951/01/23 [id number]  Mrs. Kristen Garner is a 66 yo woman with coronary artery disease, DMII, ckd stage III and anxiety, and CHF systolic and diastolic (EF 21-58% on 72/7/61) who was admitted to the hospital from 09/25/17 - 10/01/17 with atrial tachycardia, acute on chronic combined systolic and diastolic CHF, LBBB, who had resolution of her symptoms at discharge but then the next day started having shortness of breath again and was readmitted on 10/04/17 with acute on chronic combine systolic and diastolic CHF. It is anticipated that [name] will be discharged today and she has been referred to [facility] for transition of care services.   Plan: I will reach out to Mrs. Redd within 72 hours of her discharge.    [facility]  [phone]

## 2017-10-07 NOTE — Discharge Summary (Signed)
Physician Discharge Summary  Kristen Garner TIW:[id number] DOB: 1950-11-28 DOA: 10/04/2017  PCP: Sharilyn Sites, MD  Admit date: 10/04/2017 Discharge date: 10/07/2017  Admitted From: Home Disposition: Home  Recommendations for Outpatient Follow-up:  1. Follow up with PCP in 1-2 weeks 2. Please obtain BMP/CBC in one week 3. Will request follow-up with cardiology in the next 1 week.  Home Health: Patient has been referred to [facility] Equipment/Devices:  Discharge Condition: Stable CODE STATUS: Full code Diet recommendation: Heart Healthy / Carb Modified   Brief/Interim Summary: 66 yo woman with coronary artery disease, ckd stage III and anxiety, CHF systolic and diastolic (EF 25-05% on 39/7/67), recently admitted 09/25/17 - 10/01/17 with atrial tachycardia, acute on chronic combined systolic and diastolic CHF, LBBB, who had resolution of her symptoms at discharge but then the next day started having shortness of breath again. It was worse with any exertion. She has not noticed weight gain but has noticed abdominal fullness/bloating and leg swelling. She was recently admitted with heart failure thought due to atrial tachyarrhythmia and started on amiodarone and increased on metoprolol per cardiology    Discharge Diagnoses:  Principal Problem:   Acute on chronic combined systolic and diastolic CHF (congestive heart failure) (HCC) Active Problems:   Hyperglycemia due to type 2 diabetes mellitus ([place])   Essential hypertension   Atrial tachycardia (HCC)   Dyspnea on exertion  Acute on chronic combined systolic and diastolic CHF (congestive heart failure) ([place]). Patient has acute on chronic combined CHF.recent echocardiogram:EF 20-25%, diffuse hypokinesis, diastolic dysfunction, moderate mitral regurgitation. Recent admission, thought due to tachyarrhythmias. Was discharged 3 days prior to readmission on 10/04/17. Suspect she is not following instructions regarding medications, weight, and  low sodium.  Patient admits to dietary indiscretions. -Patient was diuresed with intravenous Lasix and has significantly improved. Cont aspirin and beta-blockers.  ACE inhibitor currently on hold due to bump in creatinine.  This can be started back up as an outpatient.  She was seen by nutrition to discuss dietary modifications.  She is no longer short of breath, able to ambulate without difficulty.  Appears ready for discharge home.  She is been set up with [facility] and will follow up with cardiology as an outpatient.  Chronic kidney disease stage III.  Creatinine ranging from 1.2-1.6 at baseline.    Mild bump in creatinine to 1.8 today.  Transition diuretics to p.o.  Atrial tachycardia. Appears controlled withToprol XL 100 mgBID +amiodarone.TSH and LFTs normal.  CAD status post DES x2 to the RCA in November 2017. No acute pains. Contaspirin, Plavix and statin.  GERD (gastroesophageal reflux disease).Continue PPI    Discharge Instructions  Discharge Instructions    AMB Referral to [place] [facility]   Complete by:  As directed    Reason for consult:  Post hospital follow up with [facility] RN Community case manager   Diagnoses of:   Heart Failure Diabetes     Expected date of contact:  1-3 days (reserved for hospital discharges)   Please assign patient for community nurse to engage for transition of care calls and evaluate for monthly home visits. For questions please contact:   Janci Minor RN, [facility] Liaison [phone])   Diet - low sodium heart healthy   Complete by:  As directed    Increase activity slowly   Complete by:  As directed      Allergies as of 10/07/2017      Reactions   Adhesive [tape] Rash   Codeine Itching  Penicillins Other (See Comments)   Child hood allergy Has patient had a PCN reaction causing immediate rash, facial/tongue/throat swelling, SOB or lightheadedness with hypotension: Unknown Has patient had a PCN reaction causing severe rash  involving mucus membranes or skin necrosis: Unknown Has patient had a PCN reaction that required hospitalization: No Has patient had a PCN reaction occurring within the last 10 years: No If all of the above answers are "NO", then may proceed with Cephalosporin use.   Sulfonamide Derivatives Itching   Iodine    Patient states that she got a cat scratch and her mom put iodine on it and then got catch scratch fever      Medication List    STOP taking these medications   acyclovir 400 MG tablet Commonly known as:  ZOVIRAX     TAKE these medications   albuterol 108 (90 Base) MCG/ACT inhaler Commonly known as:  PROVENTIL HFA;VENTOLIN HFA Inhale 2 puffs into the lungs every 4 (four) hours as needed for wheezing or shortness of breath.   ALPRAZolam 1 MG tablet Commonly known as:  XANAX Take 1 mg by mouth 2 (two) times daily.   amiodarone 200 MG tablet Commonly known as:  PACERONE Take 1 tablet (200 mg total) 2 (two) times daily by mouth.   ANTACID 420 MG Chew chewable tablet Generic drug:  calcium carbonate Chew 420 mg daily as needed by mouth for indigestion or heartburn.   aspirin 81 MG EC tablet Take 1 tablet (81 mg total) by mouth daily.   clopidogrel 75 MG tablet Commonly known as:  PLAVIX Take 1 tablet (75 mg total) daily by mouth.   escitalopram 10 MG tablet Commonly known as:  LEXAPRO Take 10 mg daily by mouth.   furosemide 40 MG tablet Commonly known as:  LASIX Take 1 tablet (40 mg total) daily by mouth. What changed:  how much to take   HYDROcodone-acetaminophen 5-325 MG tablet Commonly known as:  NORCO/VICODIN Take one tab po q 6 hrs prn pain What changed:    how much to take  how to take this  when to take this  reasons to take this  additional instructions   methocarbamol 500 MG tablet Commonly known as:  ROBAXIN Take 1 tablet (500 mg total) by mouth 3 (three) times daily.   metoprolol succinate 100 MG 24 hr tablet Commonly known as:   TOPROL-XL Take 1 tablet (100 mg total) 2 (two) times daily by mouth. Take with or immediately following a meal.   mometasone 50 MCG/ACT nasal spray Commonly known as:  NASONEX Place 2 sprays into the nose daily.   nitroGLYCERIN 0.4 MG SL tablet Commonly known as:  NITROSTAT Place 0.4 mg under the tongue every 5 (five) minutes as needed for chest pain.   Potassium Chloride ER 20 MEQ Tbcr Take 20 mEq by mouth daily. What changed:  when to take this   rOPINIRole 1 MG tablet Commonly known as:  REQUIP Take 1 mg at bedtime by mouth.   rosuvastatin 5 MG tablet Commonly known as:  CRESTOR Take 1 tablet (5 mg total) daily by mouth.   zolpidem 10 MG tablet Commonly known as:  AMBIEN Take 10 mg by mouth at bedtime.       Allergies  Allergen Reactions  . Adhesive [Tape] Rash  . Codeine Itching  . Penicillins Other (See Comments)    Child hood allergy Has patient had a PCN reaction causing immediate rash, facial/tongue/throat swelling, SOB or lightheadedness with hypotension: Unknown  Has patient had a PCN reaction causing severe rash involving mucus membranes or skin necrosis: Unknown Has patient had a PCN reaction that required hospitalization: No Has patient had a PCN reaction occurring within the last 10 years: No If all of the above answers are "NO", then may proceed with Cephalosporin use.   . Sulfonamide Derivatives Itching  . Iodine     Patient states that she got a cat scratch and her mom put iodine on it and then got catch scratch fever    Consultations:     Procedures/Studies: Dg Chest 2 View  Result Date: 10/04/2017 CLINICAL DATA:  66 y/o F; chest pain. History of breast cancer, bronchitis, tachycardia. EXAM: CHEST  2 VIEW COMPARISON:  09/25/2017 chest radiograph FINDINGS: Stable cardiomegaly. Aortic atherosclerosis with calcification. Diffuse increased interstitial opacities, bibasilar consolidations, and small bilateral effusions. No acute osseous abnormality  is evident. IMPRESSION: Increased pulmonary edema and small bilateral pleural effusions. Bibasilar opacities probably represent associated atelectasis. Electronically Signed   By: Kristine Garbe M.D.   On: 10/04/2017 18:30   Dg Chest 2 View  Result Date: 09/25/2017 CLINICAL DATA:  Shortness of breath for 2 days. EXAM: CHEST  2 VIEW COMPARISON:  03/28/2017 FINDINGS: Cardiomegaly with vascular congestion. Interstitial prominence and increased markings in the lung bases, most likely edema. Findings are similar prior study. Small bilateral effusions. No acute bony abnormality. IMPRESSION: Cardiomegaly with mild interstitial edema and small effusions. Findings similar to prior study. Electronically Signed   By: Rolm Baptise M.D.   On: 09/25/2017 11:09   Dg Abd 2 Views  Result Date: 10/04/2017 CLINICAL DATA:  Acute onset of shortness of breath and upper abdominal pain. Constipation. EXAM: ABDOMEN - 2 VIEW COMPARISON:  Abdominal radiograph performed 01/23/2017 FINDINGS: The visualized bowel gas pattern is unremarkable. Scattered air and stool filled loops of colon are seen; no abnormal dilatation of small bowel loops is seen to suggest small bowel obstruction. No free intra-abdominal air is identified on the provided upright view. The visualized osseous structures are within normal limits; the sacroiliac joints are unremarkable in appearance. Small bilateral pleural effusions are noted. Associated bibasilar opacities may reflect atelectasis or pneumonia. IMPRESSION: 1. Unremarkable bowel gas pattern; no free intra-abdominal air seen. Small to moderate amount of stool noted in the colon. 2. Small bilateral pleural effusions, with associated bibasilar opacities possibly reflecting atelectasis or pneumonia. Electronically Signed   By: Garald Balding M.D.   On: 10/04/2017 22:50       Subjective: Feeling better.  Shortness of breath is better.  Able to ambulate without shortness of breath.  Discharge  Exam: Vitals:   10/07/17 0806 10/07/17 1400  BP:  114/62  Pulse:  61  Resp:  16  Temp:  98.6 F (37 C)  SpO2: 92% 96%   Vitals:   10/06/17 2045 10/07/17 0517 10/07/17 0806 10/07/17 1400  BP: (!) 131/53 (!) 130/51  114/62  Pulse: 62 61  61  Resp: 18 18  16   Temp: 98.3 F (36.8 C) 98.3 F (36.8 C)  98.6 F (37 C)  TempSrc: Oral Oral  Oral  SpO2: 99% 94% 92% 96%  Weight:  80.6 kg (177 lb 11.1 oz)    Height:        General: Pt is alert, awake, not in acute distress Cardiovascular: RRR, S1/S2 +, no rubs, no gallops Respiratory: CTA bilaterally, no wheezing, no rhonchi Abdominal: Soft, NT, ND, bowel sounds + Extremities: no edema, no cyanosis    The results of  significant diagnostics from this hospitalization (including imaging, microbiology, ancillary and laboratory) are listed below for reference.     Microbiology: No results found for this or any previous visit (from the past 240 hour(s)).   Labs: BNP (last 3 results) Recent Labs    03/28/17 0610 09/25/17 1018 10/04/17 2020  BNP 338.0* 338.0* 379.0*   Basic Metabolic Panel: Recent Labs  Lab 10/01/17 0434 10/04/17 2019 10/06/17 0500 10/07/17 0445  NA 136 135 134* 132*  K 4.4 4.2 3.6 3.8  CL 100* 98* 97* 94*  CO2 26 27 29 28   GLUCOSE 183* 204* 117* 116*  BUN 36* 25* 27* 28*  CREATININE 1.41* 1.25* 1.54* 1.82*  CALCIUM 8.6* 9.6 8.6* 8.6*  MG 2.1  --   --   --    Liver Function Tests: Recent Labs  Lab 10/04/17 2019  AST 21  ALT 24  ALKPHOS 160*  BILITOT 0.9  PROT 8.9*  ALBUMIN 4.9   Recent Labs  Lab 10/04/17 2019  LIPASE 27   No results for input(s): AMMONIA in the last 168 hours. CBC: Recent Labs  Lab 10/04/17 2019  WBC 8.8  NEUTROABS 7.5  HGB 13.4  HCT 40.0  MCV 90.5  PLT 307   Cardiac Enzymes: Recent Labs  Lab 10/04/17 2019  TROPONINI <0.03   BNP: Invalid input(s): POCBNP CBG: No results for input(s): GLUCAP in the last 168 hours. D-Dimer No results for input(s):  DDIMER in the last 72 hours. Hgb A1c No results for input(s): HGBA1C in the last 72 hours. Lipid Profile No results for input(s): CHOL, HDL, LDLCALC, TRIG, CHOLHDL, LDLDIRECT in the last 72 hours. Thyroid function studies No results for input(s): TSH, T4TOTAL, T3FREE, THYROIDAB in the last 72 hours.  Invalid input(s): FREET3 Anemia work up No results for input(s): VITAMINB12, FOLATE, FERRITIN, TIBC, IRON, RETICCTPCT in the last 72 hours. Urinalysis    Component Value Date/Time   COLORURINE YELLOW 10/04/2017 2014   APPEARANCEUR HAZY (A) 10/04/2017 2014   LABSPEC 1.020 10/04/2017 2014   PHURINE 5.0 10/04/2017 2014   [place] NEGATIVE 10/04/2017 2014   HGBUR NEGATIVE 10/04/2017 2014   [place] NEGATIVE 10/04/2017 2014   [place] 10/04/2017 2014   PROTEINUR NEGATIVE 10/04/2017 2014   UROBILINOGEN 0.2 04/04/2013 2238   NITRITE NEGATIVE 10/04/2017 2014   LEUKOCYTESUR LARGE (A) 10/04/2017 2014   Sepsis Labs Invalid input(s): PROCALCITONIN,  WBC,  LACTICIDVEN Microbiology No results found for this or any previous visit (from the past 240 hour(s)).   Time coordinating discharge: Over 30 minutes  SIGNED:   Kathie Dike, MD  [facility] 10/07/2017, 7:33 PM Pager   If 7PM-7AM, please contact night-coverage [email or web address] Password [email or web address]

## 2017-10-08 NOTE — Progress Notes (Deleted)
Cardiology Office Note    Date:  10/08/2017   ID:  Kristen Garner, DOB 03/09/1951, MRN [id number]  PCP:  Sharilyn Sites, MD  Cardiologist: Dr. Bronson Ing Primary Electrophysiologist: Dr. Caryl Comes  No chief complaint on file.   History of Present Illness:    Kristen Garner is a 66 y.o. female with past medical history of CAD (s/p DESx2 to RCA in 11/2015, chronic combined systolic and diastolic CHF (EF 61-44%), HTN, HLD, and PAT who presents to the office today for hospital follow-up.   She was recently admitted to [facility] from 11/2 - 10/01/2017 for evaluation of shortness of breath.  Cyclic troponin values remained negative and her EKG showed no acute ischemic changes.  She was noted to be tachycardic upon admission with heart rate in the 130s which was thought to be secondary to her atrial tachycardia.  She was given IV Lopressor with improvement in her HR and this was switched to PO Toprol-XL which was titrated up to 100mg  BID. With her known cardiomyopathy and rates remaining difficult to control, she was started on Amiodarone for potential conversion to NSR but this did not occur. Rates did improve into the low-100's and she was continued on Amiodarone 200mg  BID at the time of hospital discharge. An echocardiogram was obtained during admission and showed her EF remained reduced at 20-25% with Grade 2 DD and diffuse HK.     She presented back to [facility] on 10/04/2017 for continued dyspnea along with abdominal pain and constipation. BNP was elevated to 911 and CXR showed increased pulmonary edema with small bilateral pleural effusions. She was admitted for an acute CHF exacerbation and started on IV Lasix. She diuresed well and weight was 174 lbs at the time of hospital discharge. HR remained well-controlled and she was continued on Amiodarone and Toprol-XL at her PTA dosing.     Past Medical History:  Diagnosis Date  . Abnormal chest CT    a. CTA 09/2016: ""1.4 cm aortopulmonary  window node seen, of uncertain significance"  . Anxiety   . Arthritis   . Breast cancer ([place])    right  . Breast cancer ([place])   . CAD (coronary artery disease)    a. 09/2016: cath showing 3-v disease with severe stenosis along RCA, CTO of PL branch, mild LAD stenosis, and moderate D1 stenosis. S/p DES x2 to RCA on 10/14/2016.  Marland Kitchen Chronic systolic CHF (congestive heart failure) ([place])    a. 09/2016: Echo showing EF of 20-25% with diffuse HK  . CKD (chronic kidney disease), stage III ([place])   . Depression   . Diabetes mellitus    takes only the pill  . Headache(784.0)    occiasional migraine  . Hyperlipidemia   . Hypertension   . Hypokalemia   . Noncompliance 01/04/2015  . Osteopenia 01/04/2015  . PAT (paroxysmal atrial tachycardia) ([place])   . PVC's (premature ventricular contractions)   . Shingles   . Skin cancer 2015   right leg    Past Surgical History:  Procedure Laterality Date  . ABDOMINAL HYSTERECTOMY    . BREAST BIOPSY Right    biopsy then wide excision   . CARDIAC CATHETERIZATION N/A 10/13/2016   Procedure: Left Heart Cath and Coronary Angiography;  Surgeon: Burnell Blanks, MD;  Location: [facility];  Service: Cardiovascular;  Laterality: N/A;  . CARDIAC CATHETERIZATION N/A 10/14/2016   Procedure: Coronary Stent Intervention;  Surgeon: Burnell Blanks, MD;  Location: [facility];  Service: Cardiovascular;  Laterality: N/A;  . CHOLECYSTECTOMY    . CORONARY ANGIOPLASTY WITH STENT PLACEMENT  10/14/2016  . PARTIAL MASTECTOMY WITH AXILLARY SENTINEL LYMPH NODE BIOPSY    . SENTINEL LYMPH NODE BIOPSY    . SKIN CANCER EXCISION Right 2015    Current Medications: Outpatient Medications Prior to Visit  Medication Sig Dispense Refill  . albuterol (PROVENTIL HFA;VENTOLIN HFA) 108 (90 Base) MCG/ACT inhaler Inhale 2 puffs into the lungs every 4 (four) hours as needed for wheezing or shortness of breath. 1 Inhaler 2  . ALPRAZolam (XANAX) 1 MG tablet Take  1 mg by mouth 2 (two) times daily.     Marland Kitchen amiodarone (PACERONE) 200 MG tablet Take 1 tablet (200 mg total) 2 (two) times daily by mouth. 60 tablet 0  . aspirin EC 81 MG EC tablet Take 1 tablet (81 mg total) by mouth daily.    . calcium carbonate (ANTACID) 420 MG CHEW chewable tablet Chew 420 mg daily as needed by mouth for indigestion or heartburn.    . clopidogrel (PLAVIX) 75 MG tablet Take 1 tablet (75 mg total) daily by mouth. 30 tablet 0  . escitalopram (LEXAPRO) 10 MG tablet Take 10 mg daily by mouth.  1  . furosemide (LASIX) 40 MG tablet Take 1 tablet (40 mg total) daily by mouth. 30 tablet 11  . HYDROcodone-acetaminophen (NORCO/VICODIN) 5-325 MG tablet Take one tab po q 6 hrs prn pain (Patient taking differently: Take 1-2 tablets by mouth every 6 (six) hours as needed for moderate pain. Take one tab po q 6 hrs prn pain) 12 tablet 0  . methocarbamol (ROBAXIN) 500 MG tablet Take 1 tablet (500 mg total) by mouth 3 (three) times daily. (Patient not taking: Reported on 10/04/2017) 21 tablet 0  . metoprolol succinate (TOPROL-XL) 100 MG 24 hr tablet Take 1 tablet (100 mg total) 2 (two) times daily by mouth. Take with or immediately following a meal. 60 tablet 0  . mometasone (NASONEX) 50 MCG/ACT nasal spray Place 2 sprays into the nose daily. 17 g 12  . nitroGLYCERIN (NITROSTAT) 0.4 MG SL tablet Place 0.4 mg under the tongue every 5 (five) minutes as needed for chest pain.    Marland Kitchen Potassium Chloride ER 20 MEQ TBCR Take 20 mEq by mouth daily. (Patient taking differently: Take 20 mEq every other day by mouth. ) 30 tablet 11  . rOPINIRole (REQUIP) 1 MG tablet Take 1 mg at bedtime by mouth.  1  . rosuvastatin (CRESTOR) 5 MG tablet Take 1 tablet (5 mg total) daily by mouth. 90 tablet 3  . zolpidem (AMBIEN) 10 MG tablet Take 10 mg by mouth at bedtime.     No facility-administered medications prior to visit.      Allergies:   Adhesive [tape]; Codeine; Penicillins; Sulfonamide derivatives; and Iodine    Social History   Socioeconomic History  . Marital status: Widowed    Spouse name: Not on file  . Number of children: Not on file  . Years of education: Not on file  . Highest education level: Not on file  Social Needs  . Financial resource strain: Not on file  . Food insecurity - worry: Not on file  . Food insecurity - inability: Not on file  . Transportation needs - medical: Not on file  . Transportation needs - non-medical: Not on file  Occupational History  . Not on file  Tobacco Use  . Smoking status: Former Research scientist (life sciences)  . Smokeless tobacco: Never Used  Substance and Sexual Activity  . Alcohol use: No  . Drug use: No  . Sexual activity: Yes    Birth control/protection: Surgical  Other Topics Concern  . Not on file  Social History Narrative  . Not on file     Family History:  The patient's ***family history includes Diabetes in her father; Stroke in her maternal grandmother.   Review of Systems:   Please see the history of present illness.     General:  No chills, fever, night sweats or weight changes.  Cardiovascular:  No chest pain, dyspnea on exertion, edema, orthopnea, palpitations, paroxysmal nocturnal dyspnea. Dermatological: No rash, lesions/masses Respiratory: No cough, dyspnea Urologic: No hematuria, dysuria Abdominal:   No nausea, vomiting, diarrhea, bright red blood per rectum, melena, or hematemesis Neurologic:  No visual changes, wkns, changes in mental status. All other systems reviewed and are otherwise negative except as noted above.   Physical Exam:    VS:  There were no vitals taken for this visit.   General: Well developed, well nourished,female appearing in no acute distress. Head: Normocephalic, atraumatic, sclera non-icteric, no xanthomas, nares are without discharge.  Neck: No carotid bruits. JVD not elevated.  Lungs: Respirations regular and unlabored, without wheezes or rales.  Heart: ***Regular rate and rhythm. No S3 or S4.  No murmur,  no rubs, or gallops appreciated. Abdomen: Soft, non-tender, non-distended with normoactive bowel sounds. No hepatomegaly. No rebound/guarding. No obvious abdominal masses. Msk:  Strength and tone appear normal for age. No joint deformities or effusions. Extremities: No clubbing or cyanosis. No edema.  Distal pedal pulses are 2+ bilaterally. Neuro: Alert and oriented X 3. Moves all extremities spontaneously. No focal deficits noted. Psych:  Responds to questions appropriately with a normal affect. Skin: No rashes or lesions noted  Wt Readings from Last 3 Encounters:  10/07/17 177 lb 11.1 oz (80.6 kg)  09/25/17 180 lb 3.2 oz (81.7 kg)  07/21/17 178 lb (80.7 kg)        Studies/Labs Reviewed:   EKG:  EKG is*** ordered today.  The ekg ordered today demonstrates ***  Recent Labs: 09/30/2017: TSH 4.421 10/01/2017: Magnesium 2.1 10/04/2017: ALT 24; B Natriuretic Peptide 911.0; Hemoglobin 13.4; Platelets 307 10/07/2017: BUN 28; Creatinine, Ser 1.82; Potassium 3.8; Sodium 132   Lipid Panel No results found for: CHOL, TRIG, HDL, CHOLHDL, VLDL, LDLCALC, LDLDIRECT  Additional studies/ records that were reviewed today include:   Echocardiogram: 09/27/2017 Study Conclusions  - Left ventricle: The cavity size was mildly dilated. There was   mild concentric hypertrophy. Systolic function was severely   reduced. The estimated ejection fraction was in the range of 20%   to 25%. Diffuse hypokinesis. Features are consistent with a   pseudonormal left ventricular filling pattern, with concomitant   abnormal relaxation and increased filling pressure (grade 2   diastolic dysfunction). Doppler parameters are consistent with   elevated ventricular end-diastolic filling pressure. - Ventricular septum: Septal motion showed paradox. - Aortic valve: Trileaflet; mildly thickened, mildly calcified   leaflets. There was mild stenosis. Mean gradient (S): 11 mm Hg.   Peak gradient (S): 19 mm Hg. Valve area  (VTI): 2.1 cm^2. Valve   area (Vmax): 2.05 cm^2. Valve area (Vmean): 1.98 cm^2. - Mitral valve: There was moderate regurgitation. - Left atrium: The atrium was moderately dilated. - Tricuspid valve: There was mild regurgitation. - Pulmonary arteries: Systolic pressure was within the normal   range. - Pericardium, extracardiac: A trivial pericardial effusion was   identified posterior  to the heart. There was a left pleural   effusion.  Assessment:    No diagnosis found.   Plan:   In order of problems listed above:  1. Chronic Combined Systolic and Diastolic CHF - EF 91-50% by her recent echocardiogram.    2. CAD - s/p DESx2 to RCA in 11/2015.   3. Atrial Tachycardia - during her recent admission from 11/2 - 10/01/2017, she was noted to be in an atrial tachycardia with titration of Toprol-XL to 100mg  BID. Hypotension did not allow for further titration and Cardizem was not initiated in the setting of her known reduced EF. Therefore, she was started on IV Amiodarone and was discharged on PO Amiodarone 200mg  BID.  -   4. HTN  5. HLD  6. Stage 3 CKD    Medication Adjustments/Labs and Tests Ordered: Current medicines are reviewed at length with the patient today.  Concerns regarding medicines are outlined above.  Medication changes, Labs and Tests ordered today are listed in the Patient Instructions below. There are no Patient Instructions on file for this visit.   Signed, Erma Heritage, PA-C  10/08/2017 12:47 PM    [facility], [address], [place]  [address] Phone: [phone]; Fax: [phone]  [address], [place], [place] [address] Phone: [phone]

## 2017-10-09 ENCOUNTER — Other Ambulatory Visit: Payer: Self-pay | Admitting: *Deleted

## 2017-10-09 NOTE — Patient Outreach (Signed)
[facility]) Care Management  10/09/2017  AMORETTE CHARRETTE 1951/03/25 [id number]   Mrs. Kristen Garner is a 66 yo woman with coronary artery disease, DMII, ckd stage III and anxiety, and CHF systolic and diastolic (EF 37-09% on 64/3/83) who was admitted to the hospital from 09/25/17 - 10/01/17 with atrial tachycardia, acute on chronic combined systolic and diastolic CHF, LBBB, who had resolution of her symptoms at discharge but then the next day started having shortness of breath again and was readmitted on 10/04/17 with acute on chronic combine systolic and diastolic CHF. [name] was discharged from the hospital on 10/07/17.   I am reaching out to [name] by phone today for transition of care services. I was unable to speak with [name] but left a HIPPA compliant voice message requesting a return call.   Provider Appointment Follow Up:  It is noted in the medical record that [name] has a scheduled post hospital cardiology appointment scheduled with Bernerd Pho, PA-C at [facility] on Monday 10/12/17 @ 2:30pm. Her primary cardiologist is Dr. Kate Sable.   I have reached out to Ms.Strader to let her know that I was unable to reach [name] but would like to engage her to help with her CHF management if she wishes to participate in our program as Ms. Ahmed Prima will be seeing [name] in the office on Monday.    Plan: I will follow up with Mrs. Swier by phone or in person weekly over the next 30 days.    [place] Management  [phone]

## 2017-10-12 ENCOUNTER — Ambulatory Visit: Payer: PPO | Admitting: Student

## 2017-10-12 ENCOUNTER — Encounter: Payer: Self-pay | Admitting: Student

## 2017-10-13 ENCOUNTER — Other Ambulatory Visit: Payer: Self-pay | Admitting: *Deleted

## 2017-10-13 NOTE — Patient Outreach (Signed)
[facility]) Care Management  10/13/2017  RELDA AGOSTO [birth year] [id number]  Unable to reach [name] by phone today on my third transition of care outreach attempt. In addition, [name] did not show up for her post hospital cardiology appointment this week.   Plan: I will attempt to reach [name] once again by phone on Friday of this week. If I am unable to reach her, I will send a letter to her home outlining and offering our free care management services to her.    [facility] Management  [phone]

## 2017-10-16 ENCOUNTER — Other Ambulatory Visit: Payer: Self-pay | Admitting: *Deleted

## 2017-10-16 NOTE — Patient Outreach (Signed)
[facility]) Care Management  10/16/2017  [name] 08-23-1951 [id number]  Unable to reach [name] by phone on at least 3 separate outreach attempts.   Plan: I will send a letter to the home of Ms. Doffing today inquiring about her interest in our support for her health care needs.    [place] Management  [phone]

## 2017-10-19 DIAGNOSIS — G894 Chronic pain syndrome: Secondary | ICD-10-CM | POA: Diagnosis not present

## 2017-10-19 DIAGNOSIS — Z6828 Body mass index (BMI) 28.0-28.9, adult: Secondary | ICD-10-CM | POA: Diagnosis not present

## 2017-10-19 DIAGNOSIS — E663 Overweight: Secondary | ICD-10-CM | POA: Diagnosis not present

## 2017-10-26 ENCOUNTER — Inpatient Hospital Stay (HOSPITAL_COMMUNITY)
Admission: EM | Admit: 2017-10-26 | Discharge: 2017-10-31 | DRG: 394 | Disposition: A | Discharge status: Home / Self Care | Payer: PPO | Attending: Internal Medicine | Admitting: Internal Medicine

## 2017-10-26 ENCOUNTER — Encounter (HOSPITAL_COMMUNITY): Payer: Self-pay | Admitting: Cardiology

## 2017-10-26 ENCOUNTER — Emergency Department (HOSPITAL_COMMUNITY): Payer: PPO

## 2017-10-26 DIAGNOSIS — N183 Chronic kidney disease, stage 3 unspecified: Secondary | ICD-10-CM

## 2017-10-26 DIAGNOSIS — I471 Supraventricular tachycardia: Secondary | ICD-10-CM | POA: Diagnosis present

## 2017-10-26 DIAGNOSIS — I2510 Atherosclerotic heart disease of native coronary artery without angina pectoris: Secondary | ICD-10-CM | POA: Diagnosis not present

## 2017-10-26 DIAGNOSIS — I130 Hypertensive heart and chronic kidney disease with heart failure and stage 1 through stage 4 chronic kidney disease, or unspecified chronic kidney disease: Secondary | ICD-10-CM | POA: Diagnosis present

## 2017-10-26 DIAGNOSIS — Z7982 Long term (current) use of aspirin: Secondary | ICD-10-CM

## 2017-10-26 DIAGNOSIS — R1013 Epigastric pain: Secondary | ICD-10-CM

## 2017-10-26 DIAGNOSIS — I5042 Chronic combined systolic (congestive) and diastolic (congestive) heart failure: Secondary | ICD-10-CM | POA: Diagnosis present

## 2017-10-26 DIAGNOSIS — K529 Noninfective gastroenteritis and colitis, unspecified: Secondary | ICD-10-CM | POA: Diagnosis not present

## 2017-10-26 DIAGNOSIS — I4581 Long QT syndrome: Secondary | ICD-10-CM | POA: Diagnosis present

## 2017-10-26 DIAGNOSIS — K5900 Constipation, unspecified: Secondary | ICD-10-CM | POA: Diagnosis not present

## 2017-10-26 DIAGNOSIS — Z85828 Personal history of other malignant neoplasm of skin: Secondary | ICD-10-CM

## 2017-10-26 DIAGNOSIS — R1031 Right lower quadrant pain: Secondary | ICD-10-CM | POA: Diagnosis not present

## 2017-10-26 DIAGNOSIS — I5022 Chronic systolic (congestive) heart failure: Secondary | ICD-10-CM

## 2017-10-26 DIAGNOSIS — E1165 Type 2 diabetes mellitus with hyperglycemia: Secondary | ICD-10-CM | POA: Diagnosis present

## 2017-10-26 DIAGNOSIS — Z79899 Other long term (current) drug therapy: Secondary | ICD-10-CM

## 2017-10-26 DIAGNOSIS — Z833 Family history of diabetes mellitus: Secondary | ICD-10-CM

## 2017-10-26 DIAGNOSIS — Z853 Personal history of malignant neoplasm of breast: Secondary | ICD-10-CM

## 2017-10-26 DIAGNOSIS — I129 Hypertensive chronic kidney disease with stage 1 through stage 4 chronic kidney disease, or unspecified chronic kidney disease: Secondary | ICD-10-CM | POA: Diagnosis not present

## 2017-10-26 DIAGNOSIS — N179 Acute kidney failure, unspecified: Secondary | ICD-10-CM | POA: Diagnosis not present

## 2017-10-26 DIAGNOSIS — F418 Other specified anxiety disorders: Secondary | ICD-10-CM | POA: Diagnosis not present

## 2017-10-26 DIAGNOSIS — K2950 Unspecified chronic gastritis without bleeding: Secondary | ICD-10-CM | POA: Diagnosis not present

## 2017-10-26 DIAGNOSIS — Z7902 Long term (current) use of antithrombotics/antiplatelets: Secondary | ICD-10-CM | POA: Diagnosis not present

## 2017-10-26 DIAGNOSIS — E1122 Type 2 diabetes mellitus with diabetic chronic kidney disease: Secondary | ICD-10-CM | POA: Diagnosis present

## 2017-10-26 DIAGNOSIS — I4719 Other supraventricular tachycardia: Secondary | ICD-10-CM | POA: Diagnosis present

## 2017-10-26 DIAGNOSIS — N289 Disorder of kidney and ureter, unspecified: Secondary | ICD-10-CM | POA: Diagnosis not present

## 2017-10-26 DIAGNOSIS — Z955 Presence of coronary angioplasty implant and graft: Secondary | ICD-10-CM | POA: Diagnosis not present

## 2017-10-26 DIAGNOSIS — I10 Essential (primary) hypertension: Secondary | ICD-10-CM | POA: Diagnosis not present

## 2017-10-26 DIAGNOSIS — R1310 Dysphagia, unspecified: Secondary | ICD-10-CM | POA: Diagnosis not present

## 2017-10-26 DIAGNOSIS — K36 Other appendicitis: Secondary | ICD-10-CM | POA: Diagnosis not present

## 2017-10-26 DIAGNOSIS — E860 Dehydration: Secondary | ICD-10-CM | POA: Diagnosis not present

## 2017-10-26 DIAGNOSIS — Z87891 Personal history of nicotine dependence: Secondary | ICD-10-CM

## 2017-10-26 DIAGNOSIS — R1010 Upper abdominal pain, unspecified: Secondary | ICD-10-CM | POA: Diagnosis not present

## 2017-10-26 DIAGNOSIS — K219 Gastro-esophageal reflux disease without esophagitis: Secondary | ICD-10-CM | POA: Diagnosis present

## 2017-10-26 DIAGNOSIS — F341 Dysthymic disorder: Secondary | ICD-10-CM | POA: Diagnosis present

## 2017-10-26 DIAGNOSIS — K3189 Other diseases of stomach and duodenum: Secondary | ICD-10-CM | POA: Diagnosis not present

## 2017-10-26 DIAGNOSIS — E785 Hyperlipidemia, unspecified: Secondary | ICD-10-CM | POA: Diagnosis present

## 2017-10-26 HISTORY — DX: Acute kidney failure, unspecified: N17.9

## 2017-10-26 LAB — COMPREHENSIVE METABOLIC PANEL
ALK PHOS: 141 U/L — AB (ref 38–126)
ALT: 24 U/L (ref 14–54)
AST: 26 U/L (ref 15–41)
Albumin: 5.1 g/dL — ABNORMAL HIGH (ref 3.5–5.0)
Anion gap: 13 (ref 5–15)
BILIRUBIN TOTAL: 0.7 mg/dL (ref 0.3–1.2)
BUN: 54 mg/dL — ABNORMAL HIGH (ref 6–20)
CALCIUM: 9.8 mg/dL (ref 8.9–10.3)
CHLORIDE: 91 mmol/L — AB (ref 101–111)
CO2: 31 mmol/L (ref 22–32)
CREATININE: 2.04 mg/dL — AB (ref 0.44–1.00)
GFR calc non Af Amer: 24 mL/min — ABNORMAL LOW (ref >60)
GFR, EST AFRICAN AMERICAN: 28 mL/min — AB (ref >60)
GLUCOSE: 189 mg/dL — AB (ref 65–99)
Potassium: 4.2 mmol/L (ref 3.5–5.1)
Sodium: 135 mmol/L (ref 135–145)
Total Protein: 8.7 g/dL — ABNORMAL HIGH (ref 6.5–8.1)

## 2017-10-26 LAB — CBC
HCT: 46.7 % — ABNORMAL HIGH (ref 36.0–46.0)
HEMOGLOBIN: 14.9 g/dL (ref 12.0–15.0)
MCH: 28.9 pg (ref 26.0–34.0)
MCHC: 31.9 g/dL (ref 30.0–36.0)
MCV: 90.5 fL (ref 78.0–100.0)
Platelets: 325 10*3/uL (ref 150–400)
RBC: 5.16 MIL/uL — AB (ref 3.87–5.11)
RDW: 13.1 % (ref 11.5–15.5)
WBC: 9.5 10*3/uL (ref 4.0–10.5)

## 2017-10-26 LAB — POC OCCULT BLOOD, ED: FECAL OCCULT BLD: NEGATIVE

## 2017-10-26 LAB — URINALYSIS, ROUTINE W REFLEX MICROSCOPIC
BILIRUBIN URINE: NEGATIVE
Bacteria, UA: NONE SEEN
Glucose, UA: NEGATIVE mg/dL
Hgb urine dipstick: NEGATIVE
KETONES UR: NEGATIVE mg/dL
Nitrite: NEGATIVE
PROTEIN: NEGATIVE mg/dL
Specific Gravity, Urine: 1.015 (ref 1.005–1.030)
pH: 5 (ref 5.0–8.0)

## 2017-10-26 LAB — LIPASE, BLOOD: Lipase: 28 U/L (ref 11–51)

## 2017-10-26 LAB — GLUCOSE, CAPILLARY: GLUCOSE-CAPILLARY: 127 mg/dL — AB (ref 65–99)

## 2017-10-26 MED ORDER — AMIODARONE HCL 200 MG PO TABS
200.0000 mg | ORAL_TABLET | Freq: Two times a day (BID) | ORAL | Status: DC
  Administered 2017-10-27 – 2017-10-31 (×9): 200 mg via ORAL
  Filled 2017-10-26 (×10): qty 1

## 2017-10-26 MED ORDER — SODIUM CHLORIDE 0.9 % IV BOLUS (SEPSIS)
500.0000 mL | Freq: Once | INTRAVENOUS | Status: AC
  Administered 2017-10-26: 500 mL via INTRAVENOUS

## 2017-10-26 MED ORDER — SODIUM CHLORIDE 0.9 % IV SOLN
INTRAVENOUS | Status: DC
  Administered 2017-10-27: via INTRAVENOUS

## 2017-10-26 MED ORDER — METOPROLOL SUCCINATE ER 50 MG PO TB24
100.0000 mg | ORAL_TABLET | Freq: Two times a day (BID) | ORAL | Status: DC
  Administered 2017-10-27 – 2017-10-31 (×9): 100 mg via ORAL
  Filled 2017-10-26 (×10): qty 2

## 2017-10-26 MED ORDER — ROSUVASTATIN CALCIUM 10 MG PO TABS
5.0000 mg | ORAL_TABLET | Freq: Every day | ORAL | Status: DC
  Administered 2017-10-27 – 2017-10-30 (×5): 5 mg via ORAL
  Filled 2017-10-26 (×5): qty 1

## 2017-10-26 MED ORDER — NITROGLYCERIN 0.4 MG SL SUBL: 0.4000 mg | SUBLINGUAL_TABLET | SUBLINGUAL | Status: DC | PRN

## 2017-10-26 MED ORDER — SODIUM CHLORIDE 0.9 % IV BOLUS (SEPSIS)
1000.0000 mL | Freq: Once | INTRAVENOUS | Status: AC
  Administered 2017-10-26: 1000 mL via INTRAVENOUS

## 2017-10-26 MED ORDER — ROPINIROLE HCL 1 MG PO TABS
1.0000 mg | ORAL_TABLET | Freq: Every day | ORAL | Status: DC
  Administered 2017-10-27 – 2017-10-30 (×5): 1 mg via ORAL
  Filled 2017-10-26 (×5): qty 1

## 2017-10-26 MED ORDER — INSULIN ASPART 100 UNIT/ML ~~LOC~~ SOLN
0.0000 [IU] | Freq: Three times a day (TID) | SUBCUTANEOUS | Status: DC
  Administered 2017-10-27: 2 [IU] via SUBCUTANEOUS
  Administered 2017-10-27: 1 [IU] via SUBCUTANEOUS
  Administered 2017-10-28 (×2): 2 [IU] via SUBCUTANEOUS
  Administered 2017-10-28 – 2017-10-29 (×3): 1 [IU] via SUBCUTANEOUS
  Administered 2017-10-30: 2 [IU] via SUBCUTANEOUS
  Administered 2017-10-30 – 2017-10-31 (×2): 1 [IU] via SUBCUTANEOUS

## 2017-10-26 MED ORDER — ESCITALOPRAM OXALATE 10 MG PO TABS
10.0000 mg | ORAL_TABLET | Freq: Every day | ORAL | Status: DC
  Administered 2017-10-27 – 2017-10-31 (×4): 10 mg via ORAL
  Filled 2017-10-26 (×5): qty 1

## 2017-10-26 MED ORDER — CIPROFLOXACIN IN D5W 400 MG/200ML IV SOLN
400.0000 mg | Freq: Two times a day (BID) | INTRAVENOUS | Status: DC
  Administered 2017-10-27 – 2017-10-30 (×7): 400 mg via INTRAVENOUS
  Filled 2017-10-26 (×7): qty 200

## 2017-10-26 MED ORDER — ALPRAZOLAM 1 MG PO TABS
1.0000 mg | ORAL_TABLET | Freq: Two times a day (BID) | ORAL | Status: DC | PRN
  Administered 2017-10-27 – 2017-10-28 (×3): 1 mg via ORAL
  Filled 2017-10-26 (×3): qty 1

## 2017-10-26 MED ORDER — CLOPIDOGREL BISULFATE 75 MG PO TABS
75.0000 mg | ORAL_TABLET | Freq: Every day | ORAL | Status: DC
  Administered 2017-10-27 – 2017-10-31 (×4): 75 mg via ORAL
  Filled 2017-10-26 (×5): qty 1

## 2017-10-26 MED ORDER — METRONIDAZOLE IN NACL 5-0.79 MG/ML-% IV SOLN
500.0000 mg | Freq: Three times a day (TID) | INTRAVENOUS | Status: DC
  Administered 2017-10-27 – 2017-10-30 (×11): 500 mg via INTRAVENOUS
  Filled 2017-10-26 (×11): qty 100

## 2017-10-26 MED ORDER — KETOROLAC TROMETHAMINE 30 MG/ML IJ SOLN
30.0000 mg | Freq: Once | INTRAMUSCULAR | Status: AC
  Administered 2017-10-27: 30 mg via INTRAVENOUS
  Filled 2017-10-26: qty 1

## 2017-10-26 MED ORDER — ASPIRIN EC 81 MG PO TBEC
81.0000 mg | DELAYED_RELEASE_TABLET | Freq: Every day | ORAL | Status: DC
  Administered 2017-10-27 – 2017-10-31 (×4): 81 mg via ORAL
  Filled 2017-10-26 (×5): qty 1

## 2017-10-26 MED ORDER — CIPROFLOXACIN IN D5W 400 MG/200ML IV SOLN
400.0000 mg | Freq: Once | INTRAVENOUS | Status: AC
  Administered 2017-10-26: 400 mg via INTRAVENOUS
  Filled 2017-10-26: qty 200

## 2017-10-26 MED ORDER — ZOLPIDEM TARTRATE 5 MG PO TABS
5.0000 mg | ORAL_TABLET | Freq: Every day | ORAL | Status: DC
  Administered 2017-10-27 – 2017-10-30 (×5): 5 mg via ORAL
  Filled 2017-10-26 (×5): qty 1

## 2017-10-26 MED ORDER — HYDROMORPHONE HCL 1 MG/ML IJ SOLN
1.0000 mg | INTRAMUSCULAR | Status: DC | PRN
  Administered 2017-10-28 – 2017-10-31 (×8): 1 mg via INTRAVENOUS
  Filled 2017-10-26 (×8): qty 1

## 2017-10-26 MED ORDER — FLUTICASONE PROPIONATE 50 MCG/ACT NA SUSP
2.0000 | Freq: Every day | NASAL | Status: DC
  Administered 2017-10-27 – 2017-10-30 (×3): 2 via NASAL
  Filled 2017-10-26: qty 16

## 2017-10-26 MED ORDER — FAMOTIDINE IN NACL 20-0.9 MG/50ML-% IV SOLN
20.0000 mg | Freq: Two times a day (BID) | INTRAVENOUS | Status: DC
  Administered 2017-10-27 – 2017-10-29 (×7): 20 mg via INTRAVENOUS
  Filled 2017-10-26 (×7): qty 50

## 2017-10-26 MED ORDER — CALCIUM CARBONATE ANTACID 500 MG PO CHEW
500.0000 mg | CHEWABLE_TABLET | Freq: Every day | ORAL | Status: DC | PRN
  Administered 2017-10-27 – 2017-10-30 (×3): 500 mg via ORAL
  Filled 2017-10-26 (×3): qty 3

## 2017-10-26 MED ORDER — METRONIDAZOLE IN NACL 5-0.79 MG/ML-% IV SOLN
500.0000 mg | Freq: Once | INTRAVENOUS | Status: AC
  Administered 2017-10-26: 500 mg via INTRAVENOUS
  Filled 2017-10-26: qty 100

## 2017-10-26 MED ORDER — HEPARIN SODIUM (PORCINE) 5000 UNIT/ML IJ SOLN
5000.0000 [IU] | Freq: Three times a day (TID) | INTRAMUSCULAR | Status: AC
  Administered 2017-10-27 – 2017-10-30 (×13): 5000 [IU] via SUBCUTANEOUS
  Filled 2017-10-26 (×13): qty 1

## 2017-10-26 MED ORDER — IOPAMIDOL (ISOVUE-300) INJECTION 61%
INTRAVENOUS | Status: AC
  Filled 2017-10-26: qty 30

## 2017-10-26 NOTE — ED Provider Notes (Addendum)
[facility] EMERGENCY DEPARTMENT Provider Note   CSN: [id number] Arrival date & time: 10/26/17  1258     History   Chief Complaint Chief Complaint  Patient presents with  . Abdominal Pain    HPI Kristen Garner is a 66 y.o. female.  HPI  complains of of epigastric abdominal pain onset 4 days ago.  Pain is improved with lying supine and made worse with standing up.  Pain is nonexertional she denies any chest pain or shortness of breath.  She reports no bowel movement for the past 7 days that she has been passing gas per rectum.  She treated herself with a laxative 4 days ago with partial relief.  No fever.  Other associated symptoms include diminished appetite.,  She had only 1.5 crackers yesterday as well as drinking slight amount of [facility] she denies any chest pain.  No other associated symptoms she denies feeling pressure in her rectum or feeling of constipation Past Medical History:  Diagnosis Date  . Abnormal chest CT    a. CTA 09/2016: ""1.4 cm aortopulmonary window node seen, of uncertain significance"  . Anxiety   . Arthritis   . Breast cancer ([place])    right  . Breast cancer ([place])   . CAD (coronary artery disease)    a. 09/2016: cath showing 3-v disease with severe stenosis along RCA, CTO of PL branch, mild LAD stenosis, and moderate D1 stenosis. S/p DES x2 to RCA on 10/14/2016.  Marland Kitchen Chronic systolic CHF (congestive heart failure) ([place])    a. 09/2016: Echo showing EF of 20-25% with diffuse HK  . CKD (chronic kidney disease), stage III ([place])   . Depression   . Diabetes mellitus    takes only the pill  . Headache(784.0)    occiasional migraine  . Hyperlipidemia   . Hypertension   . Hypokalemia   . Noncompliance 01/04/2015  . Osteopenia 01/04/2015  . PAT (paroxysmal atrial tachycardia) ([place])   . PVC's (premature ventricular contractions)   . Shingles   . Skin cancer 2015   right leg    Patient Active Problem List   Diagnosis Date Noted  . Tachycardia  09/28/2017  . Dyspnea on exertion 09/25/2017  . Atherosclerotic heart disease of native coronary artery with other forms of angina pectoris ([place]) 10/14/2016  . Atrial tachycardia ([place]) 10/14/2016  . Unstable angina ([place])   . Cardiomyopathy, ischemic   . Elevated troponin   . Acute on chronic combined systolic and diastolic CHF (congestive heart failure) ([facility])     Class: Acute  . SOB (shortness of breath) 10/10/2016  . Unifocal PVCs 10/10/2016  . GERD (gastroesophageal reflux disease) 10/10/2016  . Troponin level elevated 10/10/2016  . Osteopenia 01/04/2015  . Noncompliance 01/04/2015  . UTI (urinary tract infection) 04/05/2013  . Acute renal failure ([place]) 04/04/2013  . Diarrhea 04/04/2013  . Dehydration 04/04/2013  . Hypokalemia 04/04/2013  . Hyponatremia 04/04/2013  . Metabolic acidosis 28/31/5176  . Leucocytosis 04/04/2013  . Hyperglycemia due to type 2 diabetes mellitus ([place]) 04/04/2013  . Essential hypertension 04/04/2013  . Infiltrating ductal carcinoma of breast ([place]) 10/05/2012  . HEARTBURN 08/23/2010  . ANXIETY DEPRESSION 07/15/2010  . H/O atrial tachycardia 07/15/2010    Past Surgical History:  Procedure Laterality Date  . ABDOMINAL HYSTERECTOMY    . BREAST BIOPSY Right    biopsy then wide excision   . CARDIAC CATHETERIZATION N/A 10/13/2016   Procedure: Left Heart Cath and Coronary Angiography;  Surgeon: Burnell Blanks, MD;  Location: [place] [facility];  Service: Cardiovascular;  Laterality: N/A;  . CARDIAC CATHETERIZATION N/A 10/14/2016   Procedure: Coronary Stent Intervention;  Surgeon: Burnell Blanks, MD;  Location: [place] [facility];  Service: Cardiovascular;  Laterality: N/A;  . CHOLECYSTECTOMY    . CORONARY ANGIOPLASTY WITH STENT PLACEMENT  10/14/2016  . PARTIAL MASTECTOMY WITH AXILLARY SENTINEL LYMPH NODE BIOPSY    . SENTINEL LYMPH NODE BIOPSY    . SKIN CANCER EXCISION Right 2015    OB History    No data available       Home  Medications    Prior to Admission medications   Medication Sig Start Date End Date Taking? Authorizing Provider  albuterol (PROVENTIL HFA;VENTOLIN HFA) 108 (90 Base) MCG/ACT inhaler Inhale 2 puffs into the lungs every 4 (four) hours as needed for wheezing or shortness of breath. 03/28/17   Orpah Greek, MD  ALPRAZolam Duanne Moron) 1 MG tablet Take 1 mg by mouth 2 (two) times daily.  09/16/16   [provider]  amiodarone (PACERONE) 200 MG tablet Take 1 tablet (200 mg total) 2 (two) times daily by mouth. 10/07/17   Kathie Dike, MD  aspirin EC 81 MG EC tablet Take 1 tablet (81 mg total) by mouth daily. 10/16/16   Strader, Fransisco Hertz, PA-C  calcium carbonate (ANTACID) 420 MG CHEW chewable tablet Chew 420 mg daily as needed by mouth for indigestion or heartburn.    [provider]  clopidogrel (PLAVIX) 75 MG tablet Take 1 tablet (75 mg total) daily by mouth. 10/07/17   Kathie Dike, MD  escitalopram (LEXAPRO) 10 MG tablet Take 10 mg daily by mouth. 07/16/17   [provider]  furosemide (LASIX) 40 MG tablet Take 1 tablet (40 mg total) daily by mouth. 10/07/17   Kathie Dike, MD  HYDROcodone-acetaminophen (NORCO/VICODIN) 5-325 MG tablet Take one tab po q 6 hrs prn pain Patient taking differently: Take 1-2 tablets by mouth every 6 (six) hours as needed for moderate pain. Take one tab po q 6 hrs prn pain 07/12/17   Triplett, Tammy, PA-C  methocarbamol (ROBAXIN) 500 MG tablet Take 1 tablet (500 mg total) by mouth 3 (three) times daily. Patient not taking: Reported on 10/04/2017 07/12/17   Triplett, Tammy, PA-C  metoprolol succinate (TOPROL-XL) 100 MG 24 hr tablet Take 1 tablet (100 mg total) 2 (two) times daily by mouth. Take with or immediately following a meal. 10/07/17   Kathie Dike, MD  mometasone (NASONEX) 50 MCG/ACT nasal spray Place 2 sprays into the nose daily. 03/28/17   Orpah Greek, MD  nitroGLYCERIN (NITROSTAT) 0.4 MG SL tablet Place 0.4 mg under  the tongue every 5 (five) minutes as needed for chest pain.    [provider]  Potassium Chloride ER 20 MEQ TBCR Take 20 mEq by mouth daily. Patient taking differently: Take 20 mEq every other day by mouth.  11/12/16   Deboraha Sprang, MD  rOPINIRole (REQUIP) 1 MG tablet Take 1 mg at bedtime by mouth. 08/17/17   [provider]  rosuvastatin (CRESTOR) 5 MG tablet Take 1 tablet (5 mg total) daily by mouth. 10/07/17 01/05/18  Kathie Dike, MD  zolpidem (AMBIEN) 10 MG tablet Take 10 mg by mouth at bedtime.    [provider]    Family History Family History  Problem Relation Age of Onset  . Diabetes Father   . Stroke Maternal Grandmother     Social History Social History   Tobacco Use  .  Smoking status: Former Research scientist (life sciences)  . Smokeless tobacco: Never Used  Substance Use Topics  . Alcohol use: No  . Drug use: No     Allergies   Adhesive [tape]; Codeine; Penicillins; Sulfonamide derivatives; and Iodine   Review of Systems Review of Systems  Gastrointestinal: Positive for abdominal pain.  Allergic/Immunologic: Positive for immunocompromised state.       Diabetic  All other systems reviewed and are negative.    Physical Exam Updated Vital Signs BP (!) 135/92 (BP Location: Left Arm)   Pulse 64   Temp 97.7 F (36.5 C) (Oral)   Resp 14   SpO2 98%   Physical Exam  Constitutional: She appears well-developed and well-nourished.  HENT:  Head: Normocephalic and atraumatic.  Eyes: Conjunctivae are normal. Pupils are equal, round, and reactive to light.  Neck: Neck supple. No tracheal deviation present. No thyromegaly present.  Cardiovascular: Normal rate and regular rhythm.  No murmur heard. Pulmonary/Chest: Effort normal and breath sounds normal.  Abdominal: Soft. Bowel sounds are normal. She exhibits no distension. There is tenderness.  Tender at epigastrium  Genitourinary: Rectum normal. Rectal exam shows no tenderness and guaiac negative stool.    Musculoskeletal: Normal range of motion. She exhibits no edema or tenderness.  Neurological: She is alert. Coordination normal.  Skin: Skin is warm and dry. No rash noted.  Psychiatric: She has a normal mood and affect.  Nursing note and vitals reviewed.    ED Treatments / Results  Labs (all labs ordered are listed, but only abnormal results are displayed) Labs Reviewed  COMPREHENSIVE METABOLIC PANEL - Abnormal; Notable for the following components:      Result Value   Chloride 91 (*)    Glucose, Bld 189 (*)    BUN 54 (*)    Creatinine, Ser 2.04 (*)    Total Protein 8.7 (*)    Albumin 5.1 (*)    Alkaline Phosphatase 141 (*)    GFR calc non Af Amer 24 (*)    GFR calc Af Amer 28 (*)    All other components within normal limits  CBC - Abnormal; Notable for the following components:   RBC 5.16 (*)    HCT 46.7 (*)    All other components within normal limits  LIPASE, BLOOD  URINALYSIS, ROUTINE W REFLEX MICROSCOPIC  POC OCCULT BLOOD, ED  POC OCCULT BLOOD, ED    EKG  EKG Interpretation None       Radiology No results found.  Procedures Procedures (including critical care time)  Medications Ordered in ED Medications - No data to display  Results for orders placed or performed during the hospital encounter of 10/26/17  Lipase, blood  Result Value Ref Range   Lipase 28 11 - 51 U/L  Comprehensive metabolic panel  Result Value Ref Range   Sodium 135 135 - 145 mmol/L   Potassium 4.2 3.5 - 5.1 mmol/L   Chloride 91 (L) 101 - 111 mmol/L   CO2 31 22 - 32 mmol/L   Glucose, Bld 189 (H) 65 - 99 mg/dL   BUN 54 (H) 6 - 20 mg/dL   Creatinine, Ser 2.04 (H) 0.44 - 1.00 mg/dL   Calcium 9.8 8.9 - 10.3 mg/dL   Total Protein 8.7 (H) 6.5 - 8.1 g/dL   Albumin 5.1 (H) 3.5 - 5.0 g/dL   AST 26 15 - 41 U/L   ALT 24 14 - 54 U/L   Alkaline Phosphatase 141 (H) 38 - 126 U/L   Total Bilirubin  0.7 0.3 - 1.2 mg/dL   GFR calc non Af Amer 24 (L) >60 mL/min   GFR calc Af Amer 28 (L) >60  mL/min   Anion gap 13 5 - 15  CBC  Result Value Ref Range   WBC 9.5 4.0 - 10.5 K/uL   RBC 5.16 (H) 3.87 - 5.11 MIL/uL   Hemoglobin 14.9 12.0 - 15.0 g/dL   HCT 46.7 (H) 36.0 - 46.0 %   MCV 90.5 78.0 - 100.0 fL   MCH 28.9 26.0 - 34.0 pg   MCHC 31.9 30.0 - 36.0 g/dL   RDW 13.1 11.5 - 15.5 %   Platelets 325 150 - 400 K/uL  Urinalysis, Routine w reflex microscopic  Result Value Ref Range   Color, Urine YELLOW YELLOW   APPearance CLEAR CLEAR   Specific Gravity, Urine 1.015 1.005 - 1.030   pH 5.0 5.0 - 8.0   Glucose, UA NEGATIVE NEGATIVE mg/dL   Hgb urine dipstick NEGATIVE NEGATIVE   Bilirubin Urine NEGATIVE NEGATIVE   Ketones, ur NEGATIVE NEGATIVE mg/dL   Protein, ur NEGATIVE NEGATIVE mg/dL   Nitrite NEGATIVE NEGATIVE   Leukocytes, UA TRACE (A) NEGATIVE   RBC / HPF 0-5 0 - 5 RBC/hpf   WBC, UA 0-5 0 - 5 WBC/hpf   Bacteria, UA NONE SEEN NONE SEEN   Squamous Epithelial / LPF 0-5 (A) NONE SEEN   Mucus PRESENT    Hyaline Casts, UA PRESENT   POC occult blood, ED  Result Value Ref Range   Fecal Occult Bld NEGATIVE NEGATIVE   Ct Abdomen Pelvis Wo Contrast  Result Date: 10/26/2017 CLINICAL DATA:  Upper abdominal pain for 5 days, no bowel movement for 7 days, high grade bowel obstruction, history breast cancer, coronary artery disease post PTCA, chronic systolic CHF, diabetes mellitus, hypertension, former smoker EXAM: CT ABDOMEN AND PELVIS WITHOUT CONTRAST TECHNIQUE: Multidetector CT imaging of the abdomen and pelvis was performed following the standard protocol without IV contrast. Sagittal and coronal MPR images reconstructed from axial data set. Patient drank dilute oral contrast for exam COMPARISON:  None FINDINGS: Lower chest: Lung bases clear Hepatobiliary: Gallbladder surgically absent.  Liver unremarkable. Pancreas: Normal appearance Spleen: Normal appearance Adrenals/Urinary Tract: Adrenal glands, kidneys, ureters and bladder normal appearance Stomach/Bowel: Bowel wall thickening of  cecum. Enlarged appendix with thickened wall, overall measuring up to 13 mm diameter. No definite peri appendiceal inflammatory changes. No evidence of perforation or abscess. Ileocecal valve is patent without small bowel dilatation or obstruction. Remaining large and small bowel loops unremarkable. Stomach normal appearance. Vascular/Lymphatic: Scattered atherosclerotic calcifications aorta, iliac arteries and visceral branch vessels. Enlarged peripancreatic lymph node 14 mm short axis image 23. 11 mm portal caval node image 25. Normal and upper normal sized RIGHT cardiophrenic angle lymph nodes. Additional scattered retroperitoneal nodes without additional mobile enlargement. Reproductive: Uterus surgically absent. Nonvisualization of ovaries. Other: No free air free fluid. Tiny umbilical hernia containing fat. Musculoskeletal: Bones demineralized. IMPRESSION: Wall thickening of the cecum with associated enlargement and thickening of the appendix though no definite periappendiceal infiltrative changes are identified. This could represent either very early appendicitis or colitis/typhlitis with secondary thickening of the appendix. Coexistent cecal wall thickening makes a mucocele of the appendix unlikely. Nonspecific minimally enlarged peripancreatic and portacaval lymph nodes. Electronically Signed   By: Lavonia Dana M.D.   On: 10/26/2017 19:50   Dg Chest 2 View  Result Date: 10/04/2017 CLINICAL DATA:  66 y/o F; chest pain. History of breast cancer, bronchitis, tachycardia. EXAM: CHEST  2 VIEW COMPARISON:  09/25/2017 chest radiograph FINDINGS: Stable cardiomegaly. Aortic atherosclerosis with calcification. Diffuse increased interstitial opacities, bibasilar consolidations, and small bilateral effusions. No acute osseous abnormality is evident. IMPRESSION: Increased pulmonary edema and small bilateral pleural effusions. Bibasilar opacities probably represent associated atelectasis. Electronically Signed   By:  Kristine Garbe M.D.   On: 10/04/2017 18:30   Dg Abd 2 Views  Result Date: 10/04/2017 CLINICAL DATA:  Acute onset of shortness of breath and upper abdominal pain. Constipation. EXAM: ABDOMEN - 2 VIEW COMPARISON:  Abdominal radiograph performed 01/23/2017 FINDINGS: The visualized bowel gas pattern is unremarkable. Scattered air and stool filled loops of colon are seen; no abnormal dilatation of small bowel loops is seen to suggest small bowel obstruction. No free intra-abdominal air is identified on the provided upright view. The visualized osseous structures are within normal limits; the sacroiliac joints are unremarkable in appearance. Small bilateral pleural effusions are noted. Associated bibasilar opacities may reflect atelectasis or pneumonia. IMPRESSION: 1. Unremarkable bowel gas pattern; no free intra-abdominal air seen. Small to moderate amount of stool noted in the colon. 2. Small bilateral pleural effusions, with associated bibasilar opacities possibly reflecting atelectasis or pneumonia. Electronically Signed   By: Garald Balding M.D.   On: 10/04/2017 22:50   Initial Impression / Assessment and Plan / ED Course  I have reviewed the triage vital signs and the nursing notes.  Pertinent labs & imaging results that were available during my care of the patient were reviewed by me and considered in my medical decision making (see chart for details).     Dr. Constance Haw from surgery consulted on patient reviewed the CT scan.  She feels that patient does not require emergency surgery rather treat with intravenous antibiotics for colitis and she will follow the patient.  She requests patient be admitted to the medical service.  I consulted Dr. Olevia Bowens from hospital service will arrange for admission.  IV Cipro and Flagyl ordered 9:25 PM patient resting comfortably states pain is under control Final Clinical Impressions(s) / ED Diagnoses  Diagnoses #1 colitis #2 acute on chronic renal  insufficiency Final diagnoses:  None  #3 hyperglycemia  ED Discharge Orders    None       Orlie Dakin, MD 10/26/17 2127    Orlie Dakin, MD 10/26/17 2135

## 2017-10-26 NOTE — ED Notes (Signed)
Could not get IV on patient. Have asked charge nurse for assistance.

## 2017-10-26 NOTE — ED Triage Notes (Signed)
Upper abdominal pain times 5 days.  No BM in 7 days.

## 2017-10-26 NOTE — ED Notes (Signed)
Pt is requesting meal. Will notify nurses upstairs.

## 2017-10-26 NOTE — ED Notes (Signed)
Charge Nurse starting an IV

## 2017-10-26 NOTE — H&P (Signed)
History and Physical    Kristen Garner HFW:[id number] DOB: July 10, 1951 DOA: 10/26/2017  PCP: Sharilyn Sites, MD   Patient coming from: Home.  I have personally briefly reviewed patient's old medical records in [place]  Chief Complaint: Abdominal pain and constipation.  HPI: Kristen Garner is a 66 y.o. female with medical history significant of anxiety, depression, right breast cancer, CAD/stent placement x3 in November 0277, chronic systolic heart failure, stage III chronic kidney disease, hyperlipidemia, hypertension, osteopenia, type 2 diabetes, paroxysmal atrial tachycardia, PVCs, herpes zoster, right lower extremity of skin cancer who is coming to the emergency department with complaints of progressively worse abdominal pain for the past 5 days associated with decreased appetite, decreased fluid intake, nausea and constipation for the past 5 days.  She denies fever, chills, dyspnea, chest pain, palpitations, dizziness, diaphoresis, recent lower extremity edema, emesis, diarrhea, melena or hematochezia.  No dysuria, frequency, hematuria.  Denies blurred vision, polyuria, polydipsia or polyphagia.  ED Course: Initial vital signs temperature 36.5C, pulse 62, respirations 18, blood pressure 133/90 mmHg and O2 sat 97% on room air.  The patient receive a 500 mL normal saline bolus. She was also given metronidazole 500 mg and ciprofloxacin 400 mg IVPB. I added another 1094mL over 2 hours, ketorolac 30 mg IVP and hydromorphone 1 mg IVP.    Her urinalysis showed trace leukocyte esterase, but otherwise was unremarkable.  Her complete blood count showed a WBC of 9.5, hemoglobin 14.9 and platelets 325.  Her CMP showed a chloride of 91 millimol/L.  Glucose of 189, calcium of 9.8, BUN of 54 and creatinine of 2.04 mg/dL.  Her creatinine level has ranged from 1.25-1.82 mg/dL for the past month.  Lipase level was normal.  Fecal occult blood was negative.  Imaging:  CT abdomen/pelvis with  contrast showed wall thickening of the cecum with associated enlargement and thickening of the appendix though no definite periappendiceal infiltrative changes are identified. This could represent either very early appendicitis or colitis/typhlitis with secondary thickening of the appendix. Coexistent cecal wall thickening makes a mucocele of the appendix unlikely. Nonspecific minimally enlarged peripancreatic and portacaval lymph nodes.  Review of Systems: As per HPI otherwise 10 point review of systems negative.    Past Medical History:  Diagnosis Date  . Abnormal chest CT    a. CTA 09/2016: ""1.4 cm aortopulmonary window node seen, of uncertain significance"  . Anxiety   . Arthritis   . Breast cancer ([place])    right  . Breast cancer ([place])   . CAD (coronary artery disease)    a. 09/2016: cath showing 3-v disease with severe stenosis along RCA, CTO of PL branch, mild LAD stenosis, and moderate D1 stenosis. S/p DES x2 to RCA on 10/14/2016.  Marland Kitchen Chronic systolic CHF (congestive heart failure) ([place])    a. 09/2016: Echo showing EF of 20-25% with diffuse HK  . CKD (chronic kidney disease), stage III ([place])   . Depression   . Diabetes mellitus    takes only the pill  . Headache(784.0)    occiasional migraine  . Hyperlipidemia   . Hypertension   . Hypokalemia   . Noncompliance 01/04/2015  . Osteopenia 01/04/2015  . PAT (paroxysmal atrial tachycardia) ([place])   . PVC's (premature ventricular contractions)   . Shingles   . Skin cancer 2015   right leg    Past Surgical History:  Procedure Laterality Date  . ABDOMINAL HYSTERECTOMY    . BREAST BIOPSY Right    biopsy then  wide excision   . CARDIAC CATHETERIZATION N/A 10/13/2016   Procedure: Left Heart Cath and Coronary Angiography;  Surgeon: Burnell Blanks, MD;  Location: [facility];  Service: Cardiovascular;  Laterality: N/A;  . CARDIAC CATHETERIZATION N/A 10/14/2016   Procedure: Coronary Stent Intervention;  Surgeon:  Burnell Blanks, MD;  Location: [facility];  Service: Cardiovascular;  Laterality: N/A;  . CHOLECYSTECTOMY    . CORONARY ANGIOPLASTY WITH STENT PLACEMENT  10/14/2016  . PARTIAL MASTECTOMY WITH AXILLARY SENTINEL LYMPH NODE BIOPSY    . SENTINEL LYMPH NODE BIOPSY    . SKIN CANCER EXCISION Right 2015     reports that she has quit smoking. she has never used smokeless tobacco. She reports that she does not drink alcohol or use drugs.  Allergies  Allergen Reactions  . Adhesive [Tape] Rash  . Codeine Itching  . Penicillins Other (See Comments)    Child hood allergy Has patient had a PCN reaction causing immediate rash, facial/tongue/throat swelling, SOB or lightheadedness with hypotension: Unknown Has patient had a PCN reaction causing severe rash involving mucus membranes or skin necrosis: Unknown Has patient had a PCN reaction that required hospitalization: No Has patient had a PCN reaction occurring within the last 10 years: No If all of the above answers are "NO", then may proceed with Cephalosporin use.   . Sulfonamide Derivatives Itching  . Iodine     Patient states that she got a cat scratch and her mom put iodine on it and then got catch scratch fever    Family History  Problem Relation Age of Onset  . Diabetes Father   . Stroke Maternal Grandmother     Prior to Admission medications   Medication Sig Start Date End Date Taking? Authorizing Provider  albuterol (PROVENTIL HFA;VENTOLIN HFA) 108 (90 Base) MCG/ACT inhaler Inhale 2 puffs into the lungs every 4 (four) hours as needed for wheezing or shortness of breath. 03/28/17  Yes Pollina, Gwenyth Allegra, MD  ALPRAZolam Duanne Moron) 1 MG tablet Take 1 mg by mouth 2 (two) times daily.  09/16/16  Yes [provider]  amiodarone (PACERONE) 200 MG tablet Take 1 tablet (200 mg total) 2 (two) times daily by mouth. 10/07/17  Yes Kathie Dike, MD  aspirin EC 81 MG EC tablet Take 1 tablet (81 mg total) by mouth daily.  10/16/16  Yes Strader, [place] M, PA-C  calcium carbonate (ANTACID) 420 MG CHEW chewable tablet Chew 420 mg daily as needed by mouth for indigestion or heartburn.   Yes [provider]  clopidogrel (PLAVIX) 75 MG tablet Take 1 tablet (75 mg total) daily by mouth. 10/07/17  Yes Memon, Jolaine Artist, MD  escitalopram (LEXAPRO) 10 MG tablet Take 10 mg daily by mouth. 07/16/17  Yes [provider]  furosemide (LASIX) 40 MG tablet Take 1 tablet (40 mg total) daily by mouth. 10/07/17  Yes Memon, Jolaine Artist, MD  HYDROcodone-acetaminophen (NORCO/VICODIN) 5-325 MG tablet Take one tab po q 6 hrs prn pain Patient taking differently: Take 1-2 tablets by mouth every 6 (six) hours as needed for moderate pain. Take one tab po q 6 hrs prn pain 07/12/17  Yes Triplett, Tammy, PA-C  metoprolol succinate (TOPROL-XL) 100 MG 24 hr tablet Take 1 tablet (100 mg total) 2 (two) times daily by mouth. Take with or immediately following a meal. 10/07/17  Yes Memon, Jolaine Artist, MD  mometasone (NASONEX) 50 MCG/ACT nasal spray Place 2 sprays into the nose daily. 03/28/17  Yes Pollina, Gwenyth Allegra, MD  nitroGLYCERIN (NITROSTAT) 0.4 MG SL tablet Place 0.4 mg under the tongue every 5 (five) minutes as needed for chest pain.   Yes [provider]  Potassium Chloride ER 20 MEQ TBCR Take 20 mEq by mouth daily. Patient taking differently: Take 20 mEq every other day by mouth.  11/12/16  Yes Deboraha Sprang, MD  rOPINIRole (REQUIP) 1 MG tablet Take 1 mg at bedtime by mouth. 08/17/17  Yes [provider]  rosuvastatin (CRESTOR) 5 MG tablet Take 1 tablet (5 mg total) daily by mouth. 10/07/17 01/05/18 Yes Memon, Jolaine Artist, MD  zolpidem (AMBIEN) 10 MG tablet Take 10 mg by mouth at bedtime.   Yes [provider]  methocarbamol (ROBAXIN) 500 MG tablet Take 1 tablet (500 mg total) by mouth 3 (three) times daily. Patient not taking: Reported on 10/04/2017 07/12/17   Kem Parkinson, PA-C    Physical Exam: Vitals:    10/26/17 1620 10/26/17 1758 10/26/17 2012 10/26/17 2239  BP: (!) 135/92 (!) 132/94 120/67 (!) 135/55  Pulse: 64 89 62 60  Resp: 14 15 14 18   Temp:    98.4 F (36.9 C)  TempSrc:    Oral  SpO2: 98% 98% 96% 100%  Weight:    76.5 kg (168 lb 11.2 oz)  Height:    5\' 6"  (1.676 m)    Constitutional: NAD, calm, comfortable Eyes: PERRL, lids and conjunctivae normal ENMT: Mucous membranes and lips are mildly dry. Posterior pharynx clear of any exudate or lesions. Neck: normal, supple, no masses, no thyromegaly Respiratory: clear to auscultation bilaterally, no wheezing, no crackles. Normal respiratory effort. No accessory muscle use.  Cardiovascular: Bradycardic at 59 bpm, no murmurs / rubs / gallops. No extremity edema. 2+ pedal pulses. No carotid bruits.  Abdomen: Bowel sounds positive. Soft, positive epigastric and lower quadrants tenderness, no guarding/rebound/masses palpated. No hepatosplenomegaly.   Musculoskeletal: no clubbing / cyanosis. Good ROM, no contractures. Normal muscle tone.  Skin: Small areas of ecchymosis on upper extremities. Neurologic: CN 2-12 grossly intact. Sensation intact, DTR normal. Strength 5/5 in all 4.  Psychiatric: Normal judgment and insight. Alert and oriented x 4. Normal mood.    Labs on Admission: I have personally reviewed following labs and imaging studies  CBC: Recent Labs  Lab 10/26/17 1407  WBC 9.5  HGB 14.9  HCT 46.7*  MCV 90.5  PLT 194   Basic Metabolic Panel: Recent Labs  Lab 10/26/17 1407  NA 135  K 4.2  CL 91*  CO2 31  GLUCOSE 189*  BUN 54*  CREATININE 2.04*  CALCIUM 9.8   GFR: Estimated Creatinine Clearance: 28.3 mL/min (A) (by C-G formula based on SCr of 2.04 mg/dL (H)). Liver Function Tests: Recent Labs  Lab 10/26/17 1407  AST 26  ALT 24  ALKPHOS 141*  BILITOT 0.7  PROT 8.7*  ALBUMIN 5.1*   Recent Labs  Lab 10/26/17 1407  LIPASE 28   No results for input(s): AMMONIA in the last 168 hours. Coagulation  Profile: No results for input(s): INR, PROTIME in the last 168 hours. Cardiac Enzymes: No results for input(s): CKTOTAL, CKMB, CKMBINDEX, TROPONINI in the last 168 hours. BNP (last 3 results) No results for input(s): PROBNP in the last 8760 hours. HbA1C: No results for input(s): HGBA1C in the last 72 hours. CBG: Recent Labs  Lab 10/26/17 2238  GLUCAP 127*   Lipid Profile: No results for input(s): CHOL, HDL, LDLCALC, TRIG, CHOLHDL, LDLDIRECT in the last 72 hours. Thyroid Function Tests: No results for input(s): TSH,  T4TOTAL, FREET4, T3FREE, THYROIDAB in the last 72 hours. Anemia Panel: No results for input(s): VITAMINB12, FOLATE, FERRITIN, TIBC, IRON, RETICCTPCT in the last 72 hours. Urine analysis:    Component Value Date/Time   COLORURINE YELLOW 10/26/2017 1310   APPEARANCEUR CLEAR 10/26/2017 1310   LABSPEC 1.015 10/26/2017 1310   PHURINE 5.0 10/26/2017 1310   GLUCOSEU NEGATIVE 10/26/2017 1310   HGBUR NEGATIVE 10/26/2017 1310   BILIRUBINUR NEGATIVE 10/26/2017 1310   KETONESUR NEGATIVE 10/26/2017 1310   PROTEINUR NEGATIVE 10/26/2017 1310   UROBILINOGEN 0.2 04/04/2013 2238   NITRITE NEGATIVE 10/26/2017 1310   LEUKOCYTESUR TRACE (A) 10/26/2017 1310    Radiological Exams on Admission: Ct Abdomen Pelvis Wo Contrast  Result Date: 10/26/2017 CLINICAL DATA:  Upper abdominal pain for 5 days, no bowel movement for 7 days, high grade bowel obstruction, history breast cancer, coronary artery disease post PTCA, chronic systolic CHF, diabetes mellitus, hypertension, former smoker EXAM: CT ABDOMEN AND PELVIS WITHOUT CONTRAST TECHNIQUE: Multidetector CT imaging of the abdomen and pelvis was performed following the standard protocol without IV contrast. Sagittal and coronal MPR images reconstructed from axial data set. Patient drank dilute oral contrast for exam COMPARISON:  None FINDINGS: Lower chest: Lung bases clear Hepatobiliary: Gallbladder surgically absent.  Liver unremarkable.  Pancreas: Normal appearance Spleen: Normal appearance Adrenals/Urinary Tract: Adrenal glands, kidneys, ureters and bladder normal appearance Stomach/Bowel: Bowel wall thickening of cecum. Enlarged appendix with thickened wall, overall measuring up to 13 mm diameter. No definite peri appendiceal inflammatory changes. No evidence of perforation or abscess. Ileocecal valve is patent without small bowel dilatation or obstruction. Remaining large and small bowel loops unremarkable. Stomach normal appearance. Vascular/Lymphatic: Scattered atherosclerotic calcifications aorta, iliac arteries and visceral branch vessels. Enlarged peripancreatic lymph node 14 mm short axis image 23. 11 mm portal caval node image 25. Normal and upper normal sized RIGHT cardiophrenic angle lymph nodes. Additional scattered retroperitoneal nodes without additional mobile enlargement. Reproductive: Uterus surgically absent. Nonvisualization of ovaries. Other: No free air free fluid. Tiny umbilical hernia containing fat. Musculoskeletal: Bones demineralized. IMPRESSION: Wall thickening of the cecum with associated enlargement and thickening of the appendix though no definite periappendiceal infiltrative changes are identified. This could represent either very early appendicitis or colitis/typhlitis with secondary thickening of the appendix. Coexistent cecal wall thickening makes a mucocele of the appendix unlikely. Nonspecific minimally enlarged peripancreatic and portacaval lymph nodes. Electronically Signed   By: Lavonia Dana M.D.   On: 10/26/2017 19:50    EKG: Independently reviewed.  Assessment/Plan Principal Problem:   Colitis Admit to MedSurg/inpatient. May have a clear liquids. Continue analgesics as needed. Continue antiemetics as needed. Ciprofloxacin 400 mg IVPB every 12 hours. Metronidazole 500 mg IVPB every 8 hours. General surgery will evaluate in the morning.  Active Problems:    AKI (acute kidney injury) ([place])    Dehydration Received 1500 mL of NS bolus in the emergency department. Hold furosemide 40 mg p.o. daily. Monitor intake and output. Follow-up renal function and electrolytes.    Atrial tachycardia (HCC) Continue amiodarone 200 mg p.o. twice daily. Continue metoprolol succinate 100 mg p.o. twice daily. Check TSH periodically, given use of amiodarone. TSH on 09/30/2017 was 4.421.    CAD (coronary artery disease) Denies chest pain or dyspnea on exertion. Continue aspirin, clopidogrel, metoprolol and Crestor.  ANXIETY DEPRESSION Continue Lexapro 10 mg p.o. daily. Continue alprazolam 1 mg p.o. twice daily as needed. On Ambien 10 mg p.o. at bedtime.  Will decrease to 5 mg p.o. at bedtime.  Hyperglycemia due to type 2 diabetes mellitus (HCC) Clear liquids diet. CBG monitoring with regular insulin sliding scale.    Essential hypertension Continue metoprolol succinate 100 mg p.o. twice daily. Furosemide has been held. Monitor blood pressure, heart rate renal function and electrolytes.    GERD (gastroesophageal reflux disease) Famotidine 20 mg IVP every 12 hours.     DVT prophylaxis: Heparin SQ.Marland Kitchen Code Status: Full code. Family Communication:  Disposition Plan: Admit for IV antibiotics for 2-3 days and general surgery evaluation in AM. Consults called: Routine general surgery consult. Admission status: Inpatient/Med-Surg.   Reubin Milan MD [facility] Pager [phone].  If 7PM-7AM, please contact night-coverage [email or web address] Password [facility]  10/26/2017, 10:47 PM

## 2017-10-26 NOTE — ED Notes (Signed)
Pt to CT

## 2017-10-27 ENCOUNTER — Other Ambulatory Visit: Payer: Self-pay | Admitting: *Deleted

## 2017-10-27 ENCOUNTER — Other Ambulatory Visit: Payer: Self-pay

## 2017-10-27 LAB — CBC WITH DIFFERENTIAL/PLATELET
Basophils Absolute: 0 10*3/uL (ref 0.0–0.1)
Basophils Relative: 1 %
EOS ABS: 0.1 10*3/uL (ref 0.0–0.7)
EOS PCT: 2 %
HCT: 40.7 % (ref 36.0–46.0)
Hemoglobin: 13.2 g/dL (ref 12.0–15.0)
LYMPHS ABS: 0.9 10*3/uL (ref 0.7–4.0)
LYMPHS PCT: 12 %
MCH: 29.4 pg (ref 26.0–34.0)
MCHC: 32.4 g/dL (ref 30.0–36.0)
MCV: 90.6 fL (ref 78.0–100.0)
MONO ABS: 0.6 10*3/uL (ref 0.1–1.0)
Monocytes Relative: 7 %
Neutro Abs: 6 10*3/uL (ref 1.7–7.7)
Neutrophils Relative %: 78 %
PLATELETS: 224 10*3/uL (ref 150–400)
RBC: 4.49 MIL/uL (ref 3.87–5.11)
RDW: 13.1 % (ref 11.5–15.5)
WBC: 7.6 10*3/uL (ref 4.0–10.5)

## 2017-10-27 LAB — GLUCOSE, CAPILLARY
GLUCOSE-CAPILLARY: 158 mg/dL — AB (ref 65–99)
Glucose-Capillary: 149 mg/dL — ABNORMAL HIGH (ref 65–99)
Glucose-Capillary: 101 mg/dL — ABNORMAL HIGH (ref 65–99)
Glucose-Capillary: 102 mg/dL — ABNORMAL HIGH (ref 65–99)

## 2017-10-27 LAB — COMPREHENSIVE METABOLIC PANEL
ALT: 19 U/L (ref 14–54)
ANION GAP: 12 (ref 5–15)
AST: 19 U/L (ref 15–41)
Albumin: 4.2 g/dL (ref 3.5–5.0)
Alkaline Phosphatase: 110 U/L (ref 38–126)
BUN: 51 mg/dL — ABNORMAL HIGH (ref 6–20)
CHLORIDE: 97 mmol/L — AB (ref 101–111)
CO2: 26 mmol/L (ref 22–32)
Calcium: 9 mg/dL (ref 8.9–10.3)
Creatinine, Ser: 1.85 mg/dL — ABNORMAL HIGH (ref 0.44–1.00)
GFR, EST AFRICAN AMERICAN: 32 mL/min — AB (ref >60)
GFR, EST NON AFRICAN AMERICAN: 27 mL/min — AB (ref >60)
Glucose, Bld: 135 mg/dL — ABNORMAL HIGH (ref 65–99)
POTASSIUM: 3.7 mmol/L (ref 3.5–5.1)
SODIUM: 135 mmol/L (ref 135–145)
Total Bilirubin: 0.6 mg/dL (ref 0.3–1.2)
Total Protein: 7.2 g/dL (ref 6.5–8.1)

## 2017-10-27 MED ORDER — ONDANSETRON HCL 4 MG/2ML IJ SOLN
4.0000 mg | Freq: Four times a day (QID) | INTRAMUSCULAR | Status: DC | PRN
  Administered 2017-10-27 – 2017-10-31 (×8): 4 mg via INTRAVENOUS
  Filled 2017-10-27 (×6): qty 2

## 2017-10-27 NOTE — Patient Outreach (Signed)
[facility]) Care Management  10/27/2017  Kristen Garner 11/29/1950 [id number]  Kristen Garner is a 66 y.o. female with history of anxiety, depression, right breast cancer, CAD/stent placement x3 in November 8786, chronic systolic heart failure, stage III chronic kidney disease, hyperlipidemia, hypertension, osteopenia, type 2 diabetes, paroxysmal atrial tachycardia, PVCs, herpes zoster, and right lower extremity of skin cancer. She presented to the ED yesterday with complaints of progressively worsening abdominal pain associated with decreased appetite, decreased fluid intake, nausea and constipation. Laboratory and radiographic findings were as followed:   Urinalysis - trace leukocyte esterase, otherwise was unremarkable CBC - WBC 9.5, hemoglobin 14.9, platelets 325 CMP - chloride of 91 millimol/L, Glucose 189, calcium 9.8, BUN/ Creat 54/ 2.04 mg/dL Lipase - normal Fecal occult blood - negative  CT abdomen/pelvis with contrast  - possible very early appendicitis or colitis/typhlitis with secondary thickening of the appendix. Coexistent cecal wall thickening makes a mucocele of the appendix unlikely. Nonspecific minimally enlarged peripancreatic and portacaval lymph nodes.  Kristen Garner was afebrile on admission and vital signs were stable. She was initially treated with a total of 1500cc fluid bolus, metronidazole 500 mg and ciprofloxacin 400 mg IVPB, ketorolac 30 mg IVP and hydromorphone 1 mg IVP. She was admitted to the hospital and is being treated for colitis and AKI.   Kristen Garner was referred to [facility] for transition of care services after her previous hospitalization from 09/25/17-10/01/17 then readmission from 10/04/17-10/07/17 for atrial tachycardia and acute/chronic combined systolic and diastolic CHF. I have been able to reach Kristen Garner only once by phone and had been unable to maintain contact with her. I notified the ED Case Manager of  our attempts to establish care with Kristen Garner and have updated our hospital liaisons this morning of Kristen Garner readmission.   Plan: I will follow Kristen Garner's progress closely and we will again attempt to establish a case management relationship with Kristen Garner upon her hospital discharge if she wishes to engage.    [facility]  [phone]

## 2017-10-27 NOTE — Consult Note (Signed)
   [facility] CM Inpatient Consult   10/27/2017  GERA INBODEN [birth year] [id number]   Patient is currently active with [facility] for chronic disease management services.  Patient has been engaged by a [employer], but Associate Professor was unable to reach patient after initial contact. Went by to visit with patient to confirm phone number. Patient explained her phone had been broken, but she had a new phone. She requested this liaison to put the RN's number in her phone to be able to recognize the number when she calls. Patient has had 3 admits and 4 ED visits in the last 6 months and states she is frustrated with being in the hospital. Patient stated the [facility] nurse may not be able to do a home visit related to her two dogs who live on the porch. Patient stated she would be willing to meet the [facility] RN at her doctor's visit if possible. This liaison will make [facility] RN aware of this information. Patient maintained her phone number was the same on record.  Our community based plan of care has focused on disease management and community resource support.  Patient will receive a post discharge transition of care call and will be evaluated for monthly home visits for assessments and disease process education.  Made Inpatient Case Manager aware that [place] [facility] following. Of note, [facility] Care Management services does not replace or interfere with any services that are needed or arranged by inpatient case management or social work.  For additional questions or referrals please contact:  Rebekkah Powless RN, [facility] Liaison  [phone]) [place] [phone]) Toll free office

## 2017-10-27 NOTE — Consult Note (Signed)
[facility]  Reason for Consult: Colitis  Referring Physician:  Dr. Winfred Leeds, Dr. Jerilee Hoh  Chief Complaint    Abdominal Pain      Kristen Garner is a 66 y.o. female.  HPI: Kristen Garner is a 66 yo with multiple medical issues including CAD s/p stents in 2017, CHF, EF 25%, CKD, DM who came in with abdominal pain and constipation. She reports the abdominal pain is the upper abdomen and had been going on for 7 days. She reports that she has not had any fevers or chills, and that she has been constipated but normally does not have issues with constipation.  She was seen in the Ed due to the pain continuing, and was found to have thickening of her right colon and appendix without any stranding around the appendix or leukocytosis. She was brought into the hospital for colitis. I spoke with Dr. Winfred Leeds last night and reviewed the scan, and the patient's scan nor history nor labs reflected appendicitis.   Patient states she has never had a colonoscopy. No one in her family had a colon cancer that she knows about. Her mother had metastatic cancer, but does not know what type.   Past Medical History:  Diagnosis Date  . Abnormal chest CT    a. CTA 09/2016: ""1.4 cm aortopulmonary window node seen, of uncertain significance"  . Anxiety   . Arthritis   . Breast cancer ([place])    right  . Breast cancer ([place])   . CAD (coronary artery disease)    a. 09/2016: cath showing 3-v disease with severe stenosis along RCA, CTO of PL branch, mild LAD stenosis, and moderate D1 stenosis. S/p DES x2 to RCA on 10/14/2016.  Marland Kitchen Chronic systolic CHF (congestive heart failure) ([place])    a. 09/2016: Echo showing EF of 20-25% with diffuse HK  . CKD (chronic kidney disease), stage III ([place])   . Depression   . Diabetes mellitus    takes only the pill  . Headache(784.0)    occiasional migraine  . Hyperlipidemia   . Hypertension   . Hypokalemia   . Noncompliance 01/04/2015  . Osteopenia  01/04/2015  . PAT (paroxysmal atrial tachycardia) ([place])   . PVC's (premature ventricular contractions)   . Shingles   . Skin cancer 2015   right leg    Past Surgical History:  Procedure Laterality Date  . ABDOMINAL HYSTERECTOMY    . BREAST BIOPSY Right    biopsy then wide excision   . CARDIAC CATHETERIZATION N/A 10/13/2016   Procedure: Left Heart Cath and Coronary Angiography;  Surgeon: Burnell Blanks, MD;  Location: [facility];  Service: Cardiovascular;  Laterality: N/A;  . CARDIAC CATHETERIZATION N/A 10/14/2016   Procedure: Coronary Stent Intervention;  Surgeon: Burnell Blanks, MD;  Location: [facility];  Service: Cardiovascular;  Laterality: N/A;  . CHOLECYSTECTOMY    . CORONARY ANGIOPLASTY WITH STENT PLACEMENT  10/14/2016  . PARTIAL MASTECTOMY WITH AXILLARY SENTINEL LYMPH NODE BIOPSY    . SENTINEL LYMPH NODE BIOPSY    . SKIN CANCER EXCISION Right 2015    Family History  Problem Relation Age of Onset  . Diabetes Father   . Stroke Maternal Grandmother     Social History   Tobacco Use  . Smoking status: Former Research scientist (life sciences)  . Smokeless tobacco: Never Used  Substance Use Topics  . Alcohol use: No  . Drug use: No    Medications:  I have reviewed the patient's current  medications. Prior to Admission:  Medications Prior to Admission  Medication Sig Dispense Refill Last Dose  . albuterol (PROVENTIL HFA;VENTOLIN HFA) 108 (90 Base) MCG/ACT inhaler Inhale 2 puffs into the lungs every 4 (four) hours as needed for wheezing or shortness of breath. 1 Inhaler 2 Past Month at Unknown time  . ALPRAZolam (XANAX) 1 MG tablet Take 1 mg by mouth 2 (two) times daily.    10/25/2017 at Unknown time  . amiodarone (PACERONE) 200 MG tablet Take 1 tablet (200 mg total) 2 (two) times daily by mouth. 60 tablet 0 10/25/2017 at Unknown time  . aspirin EC 81 MG EC tablet Take 1 tablet (81 mg total) by mouth daily.   10/25/2017 at Unknown time  . calcium carbonate (ANTACID)  420 MG CHEW chewable tablet Chew 420 mg daily as needed by mouth for indigestion or heartburn.   Past Week at Unknown time  . clopidogrel (PLAVIX) 75 MG tablet Take 1 tablet (75 mg total) daily by mouth. 30 tablet 0 10/25/2017 at Unknown time  . escitalopram (LEXAPRO) 10 MG tablet Take 10 mg daily by mouth.  1 10/25/2017 at Unknown time  . furosemide (LASIX) 40 MG tablet Take 1 tablet (40 mg total) daily by mouth. 30 tablet 11 10/25/2017 at Unknown time  . HYDROcodone-acetaminophen (NORCO/VICODIN) 5-325 MG tablet Take one tab po q 6 hrs prn pain (Patient taking differently: Take 1-2 tablets by mouth every 6 (six) hours as needed for moderate pain. Take one tab po q 6 hrs prn pain) 12 tablet 0 Past Month at Unknown time  . metoprolol succinate (TOPROL-XL) 100 MG 24 hr tablet Take 1 tablet (100 mg total) 2 (two) times daily by mouth. Take with or immediately following a meal. 60 tablet 0 10/25/2017 at 2030  . mometasone (NASONEX) 50 MCG/ACT nasal spray Place 2 sprays into the nose daily. 17 g 12 10/25/2017 at Unknown time  . nitroGLYCERIN (NITROSTAT) 0.4 MG SL tablet Place 0.4 mg under the tongue every 5 (five) minutes as needed for chest pain.   Past Week at Unknown time  . Potassium Chloride ER 20 MEQ TBCR Take 20 mEq by mouth daily. (Patient taking differently: Take 20 mEq every other day by mouth. ) 30 tablet 11 10/26/2017 at Unknown time  . rOPINIRole (REQUIP) 1 MG tablet Take 1 mg at bedtime by mouth.  1 10/25/2017 at Unknown time  . rosuvastatin (CRESTOR) 5 MG tablet Take 1 tablet (5 mg total) daily by mouth. 90 tablet 3 10/25/2017 at Unknown time  . zolpidem (AMBIEN) 10 MG tablet Take 10 mg by mouth at bedtime.   10/25/2017 at Unknown time  . methocarbamol (ROBAXIN) 500 MG tablet Take 1 tablet (500 mg total) by mouth 3 (three) times daily. (Patient not taking: Reported on 10/04/2017) 21 tablet 0 Not Taking at Unknown time   Scheduled: . amiodarone  200 mg Oral BID  . aspirin EC  81 mg Oral Daily  .  clopidogrel  75 mg Oral Daily  . escitalopram  10 mg Oral Daily  . fluticasone  2 spray Each Nare Daily  . heparin  5,000 Units Subcutaneous Q8H  . insulin aspart  0-9 Units Subcutaneous TID WC  . metoprolol succinate  100 mg Oral BID  . rOPINIRole  1 mg Oral QHS  . rosuvastatin  5 mg Oral q1800  . zolpidem  5 mg Oral QHS   Continuous: . ciprofloxacin Stopped (10/27/17 0910)  . famotidine (PEPCID) IV Stopped (10/27/17 0910)  .  metronidazole 500 mg (10/27/17 0556)   AYT:KZSWFUXNAT, calcium carbonate, HYDROmorphone (DILAUDID) injection, nitroGLYCERIN, ondansetron (ZOFRAN) IV  Allergies: Allergies  Allergen Reactions  . Adhesive [Tape] Rash  . Codeine Itching  . Penicillins Other (See Comments)    Child hood allergy Has patient had a PCN reaction causing immediate rash, facial/tongue/throat swelling, SOB or lightheadedness with hypotension: Unknown Has patient had a PCN reaction causing severe rash involving mucus membranes or skin necrosis: Unknown Has patient had a PCN reaction that required hospitalization: No Has patient had a PCN reaction occurring within the last 10 years: No If all of the above answers are "NO", then may proceed with Cephalosporin use.   . Sulfonamide Derivatives Itching  . Iodine     Patient states that she got a cat scratch and her mom put iodine on it and then got catch scratch fever    ROS:  A comprehensive review of systems was negative except for: Gastrointestinal: positive for abdominal pain, constipation and some bleeding from her rectum today, dark red  Blood pressure (!) 135/55, pulse 60, temperature 98.4 F (36.9 C), temperature source Oral, resp. rate 18, height '5\' 6"'$  (1.676 m), weight 168 lb 11.2 oz (76.5 kg), SpO2 100 %. Physical Exam  Constitutional: She is oriented to person, place, and time and well-developed, well-nourished, and in no distress.  HENT:  Head: Normocephalic.  Eyes: Pupils are equal, round, and reactive to light.  Neck:  Normal range of motion.  Cardiovascular: Normal rate.  Pulmonary/Chest: Effort normal.  Abdominal: Soft. She exhibits no distension. There is tenderness. There is no rebound and no guarding.  Musculoskeletal: Normal range of motion. She exhibits no edema.  Neurological: She is alert and oriented to person, place, and time.  Skin: Skin is warm and dry.  Psychiatric: Mood, memory, affect and judgment normal.  Vitals reviewed.   Results: Results for orders placed or performed during the hospital encounter of 10/26/17 (from the past 48 hour(s))  Urinalysis, Routine w reflex microscopic     Status: Abnormal   Collection Time: 10/26/17  1:10 PM  Result Value Ref Range   Color, Urine YELLOW YELLOW   APPearance CLEAR CLEAR   Specific Gravity, Urine 1.015 1.005 - 1.030   pH 5.0 5.0 - 8.0   Glucose, UA NEGATIVE NEGATIVE mg/dL   Hgb urine dipstick NEGATIVE NEGATIVE   Bilirubin Urine NEGATIVE NEGATIVE   Ketones, ur NEGATIVE NEGATIVE mg/dL   Protein, ur NEGATIVE NEGATIVE mg/dL   Nitrite NEGATIVE NEGATIVE   Leukocytes, UA TRACE (A) NEGATIVE   RBC / HPF 0-5 0 - 5 RBC/hpf   WBC, UA 0-5 0 - 5 WBC/hpf   Bacteria, UA NONE SEEN NONE SEEN   Squamous Epithelial / LPF 0-5 (A) NONE SEEN   Mucus PRESENT    Hyaline Casts, UA PRESENT   Lipase, blood     Status: None   Collection Time: 10/26/17  2:07 PM  Result Value Ref Range   Lipase 28 11 - 51 U/L  Comprehensive metabolic panel     Status: Abnormal   Collection Time: 10/26/17  2:07 PM  Result Value Ref Range   Sodium 135 135 - 145 mmol/L   Potassium 4.2 3.5 - 5.1 mmol/L   Chloride 91 (L) 101 - 111 mmol/L   CO2 31 22 - 32 mmol/L   Glucose, Bld 189 (H) 65 - 99 mg/dL   BUN 54 (H) 6 - 20 mg/dL   Creatinine, Ser 2.04 (H) 0.44 - 1.00 mg/dL  Calcium 9.8 8.9 - 10.3 mg/dL   Total Protein 8.7 (H) 6.5 - 8.1 g/dL   Albumin 5.1 (H) 3.5 - 5.0 g/dL   AST 26 15 - 41 U/L   ALT 24 14 - 54 U/L   Alkaline Phosphatase 141 (H) 38 - 126 U/L   Total Bilirubin  0.7 0.3 - 1.2 mg/dL   GFR calc non Af Amer 24 (L) >60 mL/min   GFR calc Af Amer 28 (L) >60 mL/min    Comment: (NOTE) The eGFR has been calculated using the CKD EPI equation. This calculation has not been validated in all clinical situations. eGFR's persistently <60 mL/min signify possible Chronic Kidney Disease.    Anion gap 13 5 - 15  CBC     Status: Abnormal   Collection Time: 10/26/17  2:07 PM  Result Value Ref Range   WBC 9.5 4.0 - 10.5 K/uL   RBC 5.16 (H) 3.87 - 5.11 MIL/uL   Hemoglobin 14.9 12.0 - 15.0 g/dL   HCT 46.7 (H) 36.0 - 46.0 %   MCV 90.5 78.0 - 100.0 fL   MCH 28.9 26.0 - 34.0 pg   MCHC 31.9 30.0 - 36.0 g/dL   RDW 13.1 11.5 - 15.5 %   Platelets 325 150 - 400 K/uL  POC occult blood, ED     Status: None   Collection Time: 10/26/17  4:33 PM  Result Value Ref Range   Fecal Occult Bld NEGATIVE NEGATIVE  Glucose, capillary     Status: Abnormal   Collection Time: 10/26/17 10:38 PM  Result Value Ref Range   Glucose-Capillary 127 (H) 65 - 99 mg/dL   Comment 1 Notify RN    Comment 2 Document in Chart   CBC WITH DIFFERENTIAL     Status: None   Collection Time: 10/27/17  3:27 AM  Result Value Ref Range   WBC 7.6 4.0 - 10.5 K/uL   RBC 4.49 3.87 - 5.11 MIL/uL   Hemoglobin 13.2 12.0 - 15.0 g/dL   HCT 40.7 36.0 - 46.0 %   MCV 90.6 78.0 - 100.0 fL   MCH 29.4 26.0 - 34.0 pg   MCHC 32.4 30.0 - 36.0 g/dL   RDW 13.1 11.5 - 15.5 %   Platelets 224 150 - 400 K/uL   Neutrophils Relative % 78 %   Neutro Abs 6.0 1.7 - 7.7 K/uL   Lymphocytes Relative 12 %   Lymphs Abs 0.9 0.7 - 4.0 K/uL   Monocytes Relative 7 %   Monocytes Absolute 0.6 0.1 - 1.0 K/uL   Eosinophils Relative 2 %   Eosinophils Absolute 0.1 0.0 - 0.7 K/uL   Basophils Relative 1 %   Basophils Absolute 0.0 0.0 - 0.1 K/uL  Comprehensive metabolic panel     Status: Abnormal   Collection Time: 10/27/17  3:27 AM  Result Value Ref Range   Sodium 135 135 - 145 mmol/L   Potassium 3.7 3.5 - 5.1 mmol/L   Chloride 97 (L)  101 - 111 mmol/L   CO2 26 22 - 32 mmol/L   Glucose, Bld 135 (H) 65 - 99 mg/dL   BUN 51 (H) 6 - 20 mg/dL   Creatinine, Ser 1.85 (H) 0.44 - 1.00 mg/dL   Calcium 9.0 8.9 - 10.3 mg/dL   Total Protein 7.2 6.5 - 8.1 g/dL   Albumin 4.2 3.5 - 5.0 g/dL   AST 19 15 - 41 U/L   ALT 19 14 - 54 U/L   Alkaline Phosphatase 110 38 -  126 U/L   Total Bilirubin 0.6 0.3 - 1.2 mg/dL   GFR calc non Af Amer 27 (L) >60 mL/min   GFR calc Af Amer 32 (L) >60 mL/min    Comment: (NOTE) The eGFR has been calculated using the CKD EPI equation. This calculation has not been validated in all clinical situations. eGFR's persistently <60 mL/min signify possible Chronic Kidney Disease.    Anion gap 12 5 - 15  Glucose, capillary     Status: Abnormal   Collection Time: 10/27/17  7:58 AM  Result Value Ref Range   Glucose-Capillary 158 (H) 65 - 99 mg/dL  Glucose, capillary     Status: Abnormal   Collection Time: 10/27/17 11:10 AM  Result Value Ref Range   Glucose-Capillary 149 (H) 65 - 99 mg/dL   Personally reviewed CT scan- thickened cecum and somewhat distended appendix, no fat stranding or sign of inflammation Ct Abdomen Pelvis Wo Contrast  Result Date: 10/26/2017 CLINICAL DATA:  Upper abdominal pain for 5 days, no bowel movement for 7 days, high grade bowel obstruction, history breast cancer, coronary artery disease post PTCA, chronic systolic CHF, diabetes mellitus, hypertension, former smoker EXAM: CT ABDOMEN AND PELVIS WITHOUT CONTRAST TECHNIQUE: Multidetector CT imaging of the abdomen and pelvis was performed following the standard protocol without IV contrast. Sagittal and coronal MPR images reconstructed from axial data set. Patient drank dilute oral contrast for exam COMPARISON:  None FINDINGS: Lower chest: Lung bases clear Hepatobiliary: Gallbladder surgically absent.  Liver unremarkable. Pancreas: Normal appearance Spleen: Normal appearance Adrenals/Urinary Tract: Adrenal glands, kidneys, ureters and bladder  normal appearance Stomach/Bowel: Bowel wall thickening of cecum. Enlarged appendix with thickened wall, overall measuring up to 13 mm diameter. No definite peri appendiceal inflammatory changes. No evidence of perforation or abscess. Ileocecal valve is patent without small bowel dilatation or obstruction. Remaining large and small bowel loops unremarkable. Stomach normal appearance. Vascular/Lymphatic: Scattered atherosclerotic calcifications aorta, iliac arteries and visceral branch vessels. Enlarged peripancreatic lymph node 14 mm short axis image 23. 11 mm portal caval node image 25. Normal and upper normal sized RIGHT cardiophrenic angle lymph nodes. Additional scattered retroperitoneal nodes without additional mobile enlargement. Reproductive: Uterus surgically absent. Nonvisualization of ovaries. Other: No free air free fluid. Tiny umbilical hernia containing fat. Musculoskeletal: Bones demineralized. IMPRESSION: Wall thickening of the cecum with associated enlargement and thickening of the appendix though no definite periappendiceal infiltrative changes are identified. This could represent either very early appendicitis or colitis/typhlitis with secondary thickening of the appendix. Coexistent cecal wall thickening makes a mucocele of the appendix unlikely. Nonspecific minimally enlarged peripancreatic and portacaval lymph nodes. Electronically Signed   By: Lavonia Dana M.D.   On: 10/26/2017 19:50    Assessment & Plan:  IVANNA KOCAK is a 66 y.o. female with some thickening of the colon, possible colitis. No leukocytosis and no inflammation around the appendix. Do not think this is appendicitis. Having upper GI pain but no reported NSAID use.   -No acute surgical intervention needed -Has never had a colonoscopy, will need to get one in the future, will upper GI pain may benefit from EGD?  -Thickening of the colon but no leukocytosis, unsure if really an infectious colitis versus just  inflammatory -Once tolerating diet, pain controlled on orals, can d/c home -Follow up with PCP and GI for colonoscopy / ?EGD   All questions were answered to the satisfaction of the patient.  Kristen Garner 10/27/2017, 12:02 PM

## 2017-10-27 NOTE — Progress Notes (Signed)
Patient seen and examined, database reviewed.  No family members at bedside.  Patient admitted earlier this morning due to abdominal pain.  CT scan showed wall thickening of the cecum with enlargement and thickening of the appendix which could represent very early appendicitis or colitis.  Was admitted and placed on Cipro and Flagyl.  Has been seen in consultation by surgery, Dr. Constance Haw.  She does not believe this represents appendicitis, colitis is not clearly infectious either but plan to continue antibiotics and advance diet as tolerated.  I suspect she will be able to discharge home quickly as soon as she can tolerate food and her pain is well controlled.  Domingo Mend, MD [facility] Pager: [phone]

## 2017-10-28 ENCOUNTER — Encounter (HOSPITAL_COMMUNITY): Payer: Self-pay | Admitting: Gastroenterology

## 2017-10-28 DIAGNOSIS — N183 Chronic kidney disease, stage 3 unspecified: Secondary | ICD-10-CM

## 2017-10-28 DIAGNOSIS — K529 Noninfective gastroenteritis and colitis, unspecified: Secondary | ICD-10-CM

## 2017-10-28 DIAGNOSIS — I5042 Chronic combined systolic (congestive) and diastolic (congestive) heart failure: Secondary | ICD-10-CM

## 2017-10-28 DIAGNOSIS — E1165 Type 2 diabetes mellitus with hyperglycemia: Secondary | ICD-10-CM

## 2017-10-28 DIAGNOSIS — I5022 Chronic systolic (congestive) heart failure: Secondary | ICD-10-CM

## 2017-10-28 DIAGNOSIS — N179 Acute kidney failure, unspecified: Secondary | ICD-10-CM

## 2017-10-28 DIAGNOSIS — I2510 Atherosclerotic heart disease of native coronary artery without angina pectoris: Secondary | ICD-10-CM

## 2017-10-28 LAB — BASIC METABOLIC PANEL
ANION GAP: 12 (ref 5–15)
BUN: 46 mg/dL — ABNORMAL HIGH (ref 6–20)
CALCIUM: 9 mg/dL (ref 8.9–10.3)
CO2: 24 mmol/L (ref 22–32)
CREATININE: 1.72 mg/dL — AB (ref 0.44–1.00)
Chloride: 98 mmol/L — ABNORMAL LOW (ref 101–111)
GFR calc non Af Amer: 30 mL/min — ABNORMAL LOW (ref >60)
GFR, EST AFRICAN AMERICAN: 35 mL/min — AB (ref >60)
GLUCOSE: 135 mg/dL — AB (ref 65–99)
Potassium: 3.5 mmol/L (ref 3.5–5.1)
Sodium: 134 mmol/L — ABNORMAL LOW (ref 135–145)

## 2017-10-28 LAB — GLUCOSE, CAPILLARY
GLUCOSE-CAPILLARY: 135 mg/dL — AB (ref 65–99)
GLUCOSE-CAPILLARY: 148 mg/dL — AB (ref 65–99)
Glucose-Capillary: 158 mg/dL — ABNORMAL HIGH (ref 65–99)
Glucose-Capillary: 182 mg/dL — ABNORMAL HIGH (ref 65–99)

## 2017-10-28 LAB — CBC
HCT: 39.3 % (ref 36.0–46.0)
HEMOGLOBIN: 12.8 g/dL (ref 12.0–15.0)
MCH: 29.4 pg (ref 26.0–34.0)
MCHC: 32.6 g/dL (ref 30.0–36.0)
MCV: 90.3 fL (ref 78.0–100.0)
PLATELETS: 221 10*3/uL (ref 150–400)
RBC: 4.35 MIL/uL (ref 3.87–5.11)
RDW: 13 % (ref 11.5–15.5)
WBC: 6.1 10*3/uL (ref 4.0–10.5)

## 2017-10-28 MED ORDER — ONDANSETRON HCL 4 MG/2ML IJ SOLN
4.0000 mg | Freq: Four times a day (QID) | INTRAMUSCULAR | Status: DC
  Administered 2017-10-28 – 2017-10-30 (×6): 4 mg via INTRAVENOUS
  Filled 2017-10-28 (×11): qty 2

## 2017-10-28 MED ORDER — PROMETHAZINE HCL 25 MG/ML IJ SOLN: 12.5000 mg | Freq: Four times a day (QID) | INTRAMUSCULAR | Status: DC | PRN

## 2017-10-28 MED ORDER — ACETAMINOPHEN 325 MG RE SUPP: 325.0000 mg | RECTAL | Status: DC | PRN

## 2017-10-28 MED ORDER — ACETAMINOPHEN 325 MG PO TABS: 650.0000 mg | ORAL_TABLET | Freq: Four times a day (QID) | ORAL | Status: DC | PRN

## 2017-10-28 MED ORDER — PROMETHAZINE HCL 25 MG RE SUPP: 25.0000 mg | Freq: Four times a day (QID) | RECTAL | Status: DC | PRN

## 2017-10-28 MED ORDER — PANTOPRAZOLE SODIUM 40 MG IV SOLR
40.0000 mg | Freq: Two times a day (BID) | INTRAVENOUS | Status: DC
  Administered 2017-10-28 – 2017-10-30 (×5): 40 mg via INTRAVENOUS
  Filled 2017-10-28 (×5): qty 40

## 2017-10-28 NOTE — Progress Notes (Signed)
PROGRESS NOTE  Kristen Garner IPJ:[id number] DOB: 1951/03/04 DOA: 10/26/2017 PCP: Sharilyn Sites, MD  Brief History:  66 year old female with a history of systolic and diastolic CHF, CKD stage III, right-sided breast cancer, coronary artery disease, diabetes mellitus, hypertension, hyperlipidemia, anxiety/depression presented with epigastric abdominal pain 4 days prior to admission with intractable nausea and decreased oral intake.  She denied any fever, chills, vomiting, diarrhea, hematochezia, melena, dysuria, hematuria.  She denies any new medications.  CT of the abdomen and pelvis at the time of admission revealed cecal wall thickening with associated enlargement and thickening of the appendix without periappendiceal inflammatory changes.  General surgery was consulted.  He did not feel the patient had appendicitis.  The patient has remained afebrile hemodynamically stable without leukocytosis.  She was started on Cipro and Flagyl.  Unfortunately, the patient continues to have epigastric discomfort and intractable nausea.  Her diet has not been able to be advanced.  Assessment/Plan: Cecitis/Colitis -Continue ciprofloxacin and Flagyl for now -Appreciate general surgery consult--no acute surgical intervention needed.  Does not feel patient has appendicitis -Remains afebrile hemodynamically stable  Intractable abdominal pain and nausea -Start Protonix twice daily -GI consult -cannot tolerate diet advancement -hesitate to start reglan due to prolonged QTc with amiodarone  Chronic systolic and diastolic CHF -September 27, 2017 echo EF 20-25%, grade 2 DD, diffuse HK, mild TR, moderate MR -Appears clinically euvolemic -Holding Lasix temporarily -Daily weights  Atrial tachycardia -Continue amiodarone, metoprolol succinate  Acute on chronic renal failure --CKD stage III -Baseline creatinine 1.2-1.5 -Serum creatinine peaked at 2.04 -Monitor BMP  Coronary artery disease -No  chest pain presently -status post DES x2 to the RCA in November 2017 -Continue aspirin and Plavix  Hyperlipidemia -Continue statin  Diabetes mellitus type 2 -Continue NovoLog sliding scale -Hemoglobin A1c   Disposition Plan:   Home in 2-3 days  Family Communication:  No Family at bedside  Consultants:  GI  Code Status:  FULL   DVT Prophylaxis:  [place] Heparin   Procedures: As Listed in Progress Note Above  Antibiotics: Ciprofloxacin 12/3>>> Metronidazole 12/3>>>    Subjective: Patient continues to have epigastric pain and nausea.  She is not able to tolerate diet advancement.  She denies any emesis, hematemesis, diarrhea, hematochezia, melena.  She denies any chest pain or shortness of breath.  There is no dizziness.  There is no fevers or chills  Objective: Vitals:   10/27/17 1508 10/27/17 2006 10/27/17 2119 10/28/17 0619  BP: 118/70  (!) 132/44 (!) 134/41  Pulse: 94  (!) 54 (!) 53  Resp: 18  20 18   Temp: 97.7 F (36.5 C)  98 F (36.7 C) 97.9 F (36.6 C)  TempSrc:   Oral Oral  SpO2: 95% 98% 100% 100%  Weight:      Height:        Intake/Output Summary (Last 24 hours) at 10/28/2017 1126 Last data filed at 10/28/2017 0900 Gross per 24 hour  Intake 1720 ml  Output 1300 ml  Net 420 ml   Weight change:  Exam:   General:  Pt is alert, follows commands appropriately, not in acute distress  HEENT: No icterus, No thrush, No neck mass, [place]/AT  Cardiovascular: RRR, S1/S2, no rubs, no gallops  Respiratory: CTA bilaterally, no wheezing, no crackles, no rhonchi  Abdomen: Soft/+BS, epigastric tender, non distended, no guarding  Extremities: No edema, No lymphangitis, No petechiae, No rashes, no synovitis   Data Reviewed: I have personally  reviewed following labs and imaging studies Basic Metabolic Panel: Recent Labs  Lab 10/26/17 1407 10/27/17 0327 10/28/17 0421  NA 135 135 134*  K 4.2 3.7 3.5  CL 91* 97* 98*  CO2 31 26 24   GLUCOSE 189* 135* 135*  BUN  54* 51* 46*  CREATININE 2.04* 1.85* 1.72*  CALCIUM 9.8 9.0 9.0   Liver Function Tests: Recent Labs  Lab 10/26/17 1407 10/27/17 0327  AST 26 19  ALT 24 19  ALKPHOS 141* 110  BILITOT 0.7 0.6  PROT 8.7* 7.2  ALBUMIN 5.1* 4.2   Recent Labs  Lab 10/26/17 1407  LIPASE 28   No results for input(s): AMMONIA in the last 168 hours. Coagulation Profile: No results for input(s): INR, PROTIME in the last 168 hours. CBC: Recent Labs  Lab 10/26/17 1407 10/27/17 0327 10/28/17 0421  WBC 9.5 7.6 6.1  NEUTROABS  --  6.0  --   HGB 14.9 13.2 12.8  HCT 46.7* 40.7 39.3  MCV 90.5 90.6 90.3  PLT 325 224 221   Cardiac Enzymes: No results for input(s): CKTOTAL, CKMB, CKMBINDEX, TROPONINI in the last 168 hours. BNP: Invalid input(s): POCBNP CBG: Recent Labs  Lab 10/27/17 0758 10/27/17 1110 10/27/17 1705 10/27/17 2124 10/28/17 0738  GLUCAP 158* 149* 101* 102* 135*   HbA1C: No results for input(s): HGBA1C in the last 72 hours. Urine analysis:    Component Value Date/Time   COLORURINE YELLOW 10/26/2017 1310   APPEARANCEUR CLEAR 10/26/2017 1310   LABSPEC 1.015 10/26/2017 1310   PHURINE 5.0 10/26/2017 1310   GLUCOSEU NEGATIVE 10/26/2017 1310   HGBUR NEGATIVE 10/26/2017 1310   BILIRUBINUR NEGATIVE 10/26/2017 1310   KETONESUR NEGATIVE 10/26/2017 1310   PROTEINUR NEGATIVE 10/26/2017 1310   UROBILINOGEN 0.2 04/04/2013 2238   NITRITE NEGATIVE 10/26/2017 1310   LEUKOCYTESUR TRACE (A) 10/26/2017 1310   Sepsis Labs: @LABRCNTIP (procalcitonin:4,lacticidven:4) )No results found for this or any previous visit (from the past 240 hour(s)).   Scheduled Meds: . amiodarone  200 mg Oral BID  . aspirin EC  81 mg Oral Daily  . clopidogrel  75 mg Oral Daily  . escitalopram  10 mg Oral Daily  . fluticasone  2 spray Each Nare Daily  . heparin  5,000 Units Subcutaneous Q8H  . insulin aspart  0-9 Units Subcutaneous TID WC  . metoprolol succinate  100 mg Oral BID  . pantoprazole (PROTONIX) IV   40 mg Intravenous Q12H  . rOPINIRole  1 mg Oral QHS  . rosuvastatin  5 mg Oral q1800  . zolpidem  5 mg Oral QHS   Continuous Infusions: . ciprofloxacin Stopped (10/28/17 0928)  . famotidine (PEPCID) IV Stopped (10/28/17 0539)  . metronidazole Stopped (10/28/17 7673)    Procedures/Studies: Ct Abdomen Pelvis Wo Contrast  Result Date: 10/26/2017 CLINICAL DATA:  Upper abdominal pain for 5 days, no bowel movement for 7 days, high grade bowel obstruction, history breast cancer, coronary artery disease post PTCA, chronic systolic CHF, diabetes mellitus, hypertension, former smoker EXAM: CT ABDOMEN AND PELVIS WITHOUT CONTRAST TECHNIQUE: Multidetector CT imaging of the abdomen and pelvis was performed following the standard protocol without IV contrast. Sagittal and coronal MPR images reconstructed from axial data set. Patient drank dilute oral contrast for exam COMPARISON:  None FINDINGS: Lower chest: Lung bases clear Hepatobiliary: Gallbladder surgically absent.  Liver unremarkable. Pancreas: Normal appearance Spleen: Normal appearance Adrenals/Urinary Tract: Adrenal glands, kidneys, ureters and bladder normal appearance Stomach/Bowel: Bowel wall thickening of cecum. Enlarged appendix with thickened wall, overall measuring up  to 13 mm diameter. No definite peri appendiceal inflammatory changes. No evidence of perforation or abscess. Ileocecal valve is patent without small bowel dilatation or obstruction. Remaining large and small bowel loops unremarkable. Stomach normal appearance. Vascular/Lymphatic: Scattered atherosclerotic calcifications aorta, iliac arteries and visceral branch vessels. Enlarged peripancreatic lymph node 14 mm short axis image 23. 11 mm portal caval node image 25. Normal and upper normal sized RIGHT cardiophrenic angle lymph nodes. Additional scattered retroperitoneal nodes without additional mobile enlargement. Reproductive: Uterus surgically absent. Nonvisualization of ovaries. Other:  No free air free fluid. Tiny umbilical hernia containing fat. Musculoskeletal: Bones demineralized. IMPRESSION: Wall thickening of the cecum with associated enlargement and thickening of the appendix though no definite periappendiceal infiltrative changes are identified. This could represent either very early appendicitis or colitis/typhlitis with secondary thickening of the appendix. Coexistent cecal wall thickening makes a mucocele of the appendix unlikely. Nonspecific minimally enlarged peripancreatic and portacaval lymph nodes. Electronically Signed   By: Lavonia Dana M.D.   On: 10/26/2017 19:50   Dg Chest 2 View  Result Date: 10/04/2017 CLINICAL DATA:  66 y/o F; chest pain. History of breast cancer, bronchitis, tachycardia. EXAM: CHEST  2 VIEW COMPARISON:  09/25/2017 chest radiograph FINDINGS: Stable cardiomegaly. Aortic atherosclerosis with calcification. Diffuse increased interstitial opacities, bibasilar consolidations, and small bilateral effusions. No acute osseous abnormality is evident. IMPRESSION: Increased pulmonary edema and small bilateral pleural effusions. Bibasilar opacities probably represent associated atelectasis. Electronically Signed   By: Kristine Garbe M.D.   On: 10/04/2017 18:30   Dg Abd 2 Views  Result Date: 10/04/2017 CLINICAL DATA:  Acute onset of shortness of breath and upper abdominal pain. Constipation. EXAM: ABDOMEN - 2 VIEW COMPARISON:  Abdominal radiograph performed 01/23/2017 FINDINGS: The visualized bowel gas pattern is unremarkable. Scattered air and stool filled loops of colon are seen; no abnormal dilatation of small bowel loops is seen to suggest small bowel obstruction. No free intra-abdominal air is identified on the provided upright view. The visualized osseous structures are within normal limits; the sacroiliac joints are unremarkable in appearance. Small bilateral pleural effusions are noted. Associated bibasilar opacities may reflect atelectasis or  pneumonia. IMPRESSION: 1. Unremarkable bowel gas pattern; no free intra-abdominal air seen. Small to moderate amount of stool noted in the colon. 2. Small bilateral pleural effusions, with associated bibasilar opacities possibly reflecting atelectasis or pneumonia. Electronically Signed   By: Garald Balding M.D.   On: 10/04/2017 22:50    Orson Eva, DO  [facility] Pager [phone]  If 7PM-7AM, please contact night-coverage [email or web address] Password [email or web address] 10/28/2017, 11:26 AM   LOS: 2 days

## 2017-10-28 NOTE — Consult Note (Signed)
Referring Provider: Orson Eva, MD Primary Care Physician:  Sharilyn Sites, MD Primary Gastroenterologist:  Dr. Gala Romney (previously unassigned)  Reason for Consultation:  Intractable nausea, difficult diet advancement  HPI: Kristen Garner is a 66 y.o. female history of anxiety, depression, breast cancer, CAD/stent placement x3 in November 1856, chronic systolic heart failure, stage III chronic renal disease, type 2 diabetes, paroxysmal atrial tachycardia, who presented to the emergency department with complaints of progressive abdominal pain for 5 days associated with decreased appetite, nausea and constipation. Denies vomiting or fever.  Has been having problems with constipation but usually does not have issues with her bowels.  No prior colonoscopy. No prior colonoscopy. Feels very nauseated but no vomiting. Heartburn, not well-controlled with antacids.   CT A/P without contrast two days ago showed wall thickening of cecum with associated enlargement and thickening of the appendix but no definite periappendiceal infiltrative changes. ?early appendicitis vs colitis/typhlitis with secondary thickening of the appendix.   Patient started on cipro/flagyl. Has seen general surgery who did not feel she had acute appendicitis. Patient continues to have epigastric pain and N/V and difficulty with diet advancement therefore GI consulted.   During evaluation, patient uncomfortable. Would not make eye contact, or elaborate on answers. Stating she felt too bad to talk. Sipping on liquids during evaluation. Rubbing upper abdomen. States she had solid "dark" stool today. No diarrhea. Has not had diarrhea. Stool heme negative two days ago and Hgb normal. Denies NSAIDS. Take ASA/plavix daily. States her nausea and abd pain worse after taking her morning medications.   Prior to Admission medications   Medication Sig Start Date End Date Taking? Authorizing Provider  albuterol (PROVENTIL HFA;VENTOLIN HFA) 108 (90  Base) MCG/ACT inhaler Inhale 2 puffs into the lungs every 4 (four) hours as needed for wheezing or shortness of breath. 03/28/17  Yes Pollina, Gwenyth Allegra, MD  ALPRAZolam Duanne Moron) 1 MG tablet Take 1 mg by mouth 2 (two) times daily.  09/16/16  Yes [provider]  amiodarone (PACERONE) 200 MG tablet Take 1 tablet (200 mg total) 2 (two) times daily by mouth. 10/07/17  Yes Kathie Dike, MD  aspirin EC 81 MG EC tablet Take 1 tablet (81 mg total) by mouth daily. 10/16/16  Yes Strader, [place] M, PA-C  calcium carbonate (ANTACID) 420 MG CHEW chewable tablet Chew 420 mg daily as needed by mouth for indigestion or heartburn.   Yes [provider]  clopidogrel (PLAVIX) 75 MG tablet Take 1 tablet (75 mg total) daily by mouth. 10/07/17  Yes Memon, Jolaine Artist, MD  escitalopram (LEXAPRO) 10 MG tablet Take 10 mg daily by mouth. 07/16/17  Yes [provider]  furosemide (LASIX) 40 MG tablet Take 1 tablet (40 mg total) daily by mouth. 10/07/17  Yes Memon, Jolaine Artist, MD  HYDROcodone-acetaminophen (NORCO/VICODIN) 5-325 MG tablet Take one tab po q 6 hrs prn pain Patient taking differently: Take 1-2 tablets by mouth every 6 (six) hours as needed for moderate pain. Take one tab po q 6 hrs prn pain 07/12/17  Yes Triplett, Tammy, PA-C  metoprolol succinate (TOPROL-XL) 100 MG 24 hr tablet Take 1 tablet (100 mg total) 2 (two) times daily by mouth. Take with or immediately following a meal. 10/07/17  Yes Memon, Jolaine Artist, MD  mometasone (NASONEX) 50 MCG/ACT nasal spray Place 2 sprays into the nose daily. 03/28/17  Yes Pollina, Gwenyth Allegra, MD  nitroGLYCERIN (NITROSTAT) 0.4 MG SL tablet Place 0.4 mg under the tongue every 5 (five) minutes as needed for chest pain.  Yes [provider]  Potassium Chloride ER 20 MEQ TBCR Take 20 mEq by mouth daily. Patient taking differently: Take 20 mEq every other day by mouth.  11/12/16  Yes Deboraha Sprang, MD  rOPINIRole (REQUIP) 1 MG tablet Take 1 mg at  bedtime by mouth. 08/17/17  Yes [provider]  rosuvastatin (CRESTOR) 5 MG tablet Take 1 tablet (5 mg total) daily by mouth. 10/07/17 01/05/18 Yes Memon, Jolaine Artist, MD  zolpidem (AMBIEN) 10 MG tablet Take 10 mg by mouth at bedtime.   Yes [provider]  methocarbamol (ROBAXIN) 500 MG tablet Take 1 tablet (500 mg total) by mouth 3 (three) times daily. Patient not taking: Reported on 10/04/2017 07/12/17   Kem Parkinson, PA-C    Current Facility-Administered Medications  Medication Dose Route Frequency Provider Last Rate Last Dose  . ALPRAZolam Duanne Moron) tablet 1 mg  1 mg Oral BID PRN Reubin Milan, MD   1 mg at 10/28/17 6759  . amiodarone (PACERONE) tablet 200 mg  200 mg Oral BID Reubin Milan, MD   200 mg at 10/28/17 0818  . aspirin EC tablet 81 mg  81 mg Oral Daily Reubin Milan, MD   81 mg at 10/28/17 0818  . calcium carbonate (TUMS - dosed in mg elemental calcium) chewable tablet 500 mg  500 mg Oral Daily PRN Reubin Milan, MD   500 mg at 10/28/17 1230  . ciprofloxacin (CIPRO) IVPB 400 mg  400 mg Intravenous Q12H Reubin Milan, MD   Stopped at 10/28/17 [phone]  . clopidogrel (PLAVIX) tablet 75 mg  75 mg Oral Daily Reubin Milan, MD   75 mg at 10/28/17 0818  . escitalopram (LEXAPRO) tablet 10 mg  10 mg Oral Daily Reubin Milan, MD   10 mg at 10/28/17 0818  . famotidine (PEPCID) IVPB 20 mg premix  20 mg Intravenous Q12H Reubin Milan, MD   Stopped at 10/28/17 [phone]  . fluticasone (FLONASE) 50 MCG/ACT nasal spray 2 spray  2 spray Each Nare Daily Reubin Milan, MD   2 spray at 10/28/17 0819  . heparin injection 5,000 Units  5,000 Units Subcutaneous Q8H Reubin Milan, MD   5,000 Units at 10/28/17 1232  . HYDROmorphone (DILAUDID) injection 1 mg  1 mg Intravenous Q4H PRN Reubin Milan, MD      . insulin aspart (novoLOG) injection 0-9 Units  0-9 Units Subcutaneous TID WC Reubin Milan, MD   2 Units at 10/28/17 1158  .  metoprolol succinate (TOPROL-XL) 24 hr tablet 100 mg  100 mg Oral BID Reubin Milan, MD   100 mg at 10/28/17 0818  . metroNIDAZOLE (FLAGYL) IVPB 500 mg  500 mg Intravenous Q8H Reubin Milan, MD 100 mL/hr at 10/28/17 1231 500 mg at 10/28/17 1231  . nitroGLYCERIN (NITROSTAT) SL tablet 0.4 mg  0.4 mg Sublingual Q5 min PRN Reubin Milan, MD      . ondansetron [facility]) injection 4 mg  4 mg Intravenous Q6H PRN Reubin Milan, MD   4 mg at 10/28/17 9935  . pantoprazole (PROTONIX) injection 40 mg  40 mg Intravenous Therisa Doyne, MD   40 mg at 10/28/17 1231  . promethazine (PHENERGAN) injection 12.5 mg  12.5 mg Intravenous Q6H PRN Tat, David, MD      . rOPINIRole (REQUIP) tablet 1 mg  1 mg Oral QHS Reubin Milan, MD   1 mg at 10/27/17 2123  . rosuvastatin (CRESTOR) tablet  5 mg  5 mg Oral q1800 Reubin Milan, MD   5 mg at 10/27/17 1806  . zolpidem (AMBIEN) tablet 5 mg  5 mg Oral QHS Reubin Milan, MD   5 mg at 10/27/17 2124    Allergies as of 10/26/2017 - Review Complete 10/26/2017  Allergen Reaction Noted  . Adhesive [tape] Rash 09/28/2017  . Codeine Itching   . Penicillins Other (See Comments)   . Sulfonamide derivatives Itching   . Iodine      Past Medical History:  Diagnosis Date  . Abnormal chest CT    a. CTA 09/2016: ""1.4 cm aortopulmonary window node seen, of uncertain significance"  . Anxiety   . Arthritis   . Breast cancer ([place])    right  . Breast cancer ([place])   . CAD (coronary artery disease)    a. 09/2016: cath showing 3-v disease with severe stenosis along RCA, CTO of PL branch, mild LAD stenosis, and moderate D1 stenosis. S/p DES x2 to RCA on 10/14/2016.  Marland Kitchen Chronic systolic CHF (congestive heart failure) ([place])    a. 09/2016: Echo showing EF of 20-25% with diffuse HK  . CKD (chronic kidney disease), stage III ([place])   . Depression   . Diabetes mellitus    takes only the pill  . Headache(784.0)    occiasional migraine  .  Hyperlipidemia   . Hypertension   . Hypokalemia   . Noncompliance 01/04/2015  . Osteopenia 01/04/2015  . PAT (paroxysmal atrial tachycardia) ([place])   . PVC's (premature ventricular contractions)   . Shingles   . Skin cancer 2015   right leg    Past Surgical History:  Procedure Laterality Date  . ABDOMINAL HYSTERECTOMY    . BREAST BIOPSY Right    biopsy then wide excision   . CARDIAC CATHETERIZATION N/A 10/13/2016   Procedure: Left Heart Cath and Coronary Angiography;  Surgeon: Burnell Blanks, MD;  Location: [facility];  Service: Cardiovascular;  Laterality: N/A;  . CARDIAC CATHETERIZATION N/A 10/14/2016   Procedure: Coronary Stent Intervention;  Surgeon: Burnell Blanks, MD;  Location: [facility];  Service: Cardiovascular;  Laterality: N/A;  . CHOLECYSTECTOMY    . CORONARY ANGIOPLASTY WITH STENT PLACEMENT  10/14/2016  . PARTIAL MASTECTOMY WITH AXILLARY SENTINEL LYMPH NODE BIOPSY    . SENTINEL LYMPH NODE BIOPSY    . SKIN CANCER EXCISION Right 2015    Family History  Problem Relation Age of Onset  . Diabetes Father   . Stroke Maternal Grandmother     Social History   Socioeconomic History  . Marital status: Widowed    Spouse name: Not on file  . Number of children: Not on file  . Years of education: Not on file  . Highest education level: Not on file  Social Needs  . Financial resource strain: Not on file  . Food insecurity - worry: Not on file  . Food insecurity - inability: Not on file  . Transportation needs - medical: Not on file  . Transportation needs - non-medical: Not on file  Occupational History  . Not on file  Tobacco Use  . Smoking status: Former Research scientist (life sciences)  . Smokeless tobacco: Never Used  Substance and Sexual Activity  . Alcohol use: No  . Drug use: No  . Sexual activity: Yes    Birth control/protection: Surgical  Other Topics Concern  . Not on file  Social History Narrative  . Not on file     ROS:  General:  Negative for  weight loss, fever, chills, fatigue, weakness. Anorexia. Eyes: Negative for vision changes.  ENT: Negative for hoarseness, difficulty swallowing , nasal congestion. CV: Negative for chest pain, angina, palpitations, dyspnea on exertion, peripheral edema.  Respiratory: Negative for dyspnea at rest, dyspnea on exertion, cough, sputum, wheezing.  GI: See history of present illness. GU:  Negative for dysuria, hematuria, urinary incontinence, urinary frequency, nocturnal urination.  MS: Negative for joint pain, low back pain.  Derm: Negative for rash or itching.  Neuro: Negative for weakness, abnormal sensation, seizure, frequent headaches, memory loss, confusion.  Psych: Negative for anxiety, depression, suicidal ideation, hallucinations.  Endo: Negative for unusual weight change.  Heme: Negative for bruising or bleeding. Allergy: Negative for rash or hives.       Physical Examination: Vital signs in last 24 hours: Temp:  [97.7 F (36.5 C)-98 F (36.7 C)] 97.9 F (36.6 C) (12/05 0619) Pulse Rate:  [53-94] 53 (12/05 0619) Resp:  [18-20] 18 (12/05 0619) BP: (118-134)/(41-70) 134/41 (12/05 0619) SpO2:  [95 %-100 %] 100 % (12/05 0619) Weight:  [168 lb 11.2 oz (76.5 kg)] 168 lb 11.2 oz (76.5 kg) (12/05 1100) Last BM Date: 10/27/17  General: appears uncomfortable but no acute distress.  Head: Normocephalic, atraumatic.   Eyes: Conjunctiva pink, no icterus. Mouth: Oropharyngeal mucosa moist and pink , no lesions erythema or exudate. Neck: Supple without thyromegaly, masses, or lymphadenopathy.  Lungs: Clear to auscultation bilaterally.  Heart: Regular rate and rhythm, no murmurs rubs or gallops.  Abdomen: Bowel sounds are normal, moderate epigastric and RLQ tenderness, nondistended, no hepatosplenomegaly or masses, no abdominal bruits or hernia , no rebound or guarding.   Rectal: not performed Extremities: No lower extremity edema, clubbing, deformity.  Neuro: Alert and  oriented x 4 , grossly normal neurologically.  Skin: Warm and dry, no rash or jaundice.   Psych: Alert and cooperative, normal mood and affect.        Intake/Output from previous day: 12/04 0701 - 12/05 0700 In: 1840 [P.O.:1440; IV Piggyback:400] Out: 1300 [Urine:1300] Intake/Output this shift: Total I/O In: 240 [P.O.:240] Out: -   Lab Results: CBC Recent Labs    10/26/17 1407 10/27/17 0327 10/28/17 0421  WBC 9.5 7.6 6.1  HGB 14.9 13.2 12.8  HCT 46.7* 40.7 39.3  MCV 90.5 90.6 90.3  PLT 325 224 221   BMET Recent Labs    10/26/17 1407 10/27/17 0327 10/28/17 0421  NA 135 135 134*  K 4.2 3.7 3.5  CL 91* 97* 98*  CO2 31 26 24   GLUCOSE 189* 135* 135*  BUN 54* 51* 46*  CREATININE 2.04* 1.85* 1.72*  CALCIUM 9.8 9.0 9.0   LFT Recent Labs    10/26/17 1407 10/27/17 0327  BILITOT 0.7 0.6  ALKPHOS 141* 110  AST 26 19  ALT 24 19  PROT 8.7* 7.2  ALBUMIN 5.1* 4.2    Lipase Recent Labs    10/26/17 1407  LIPASE 28    PT/INR No results for input(s): LABPROT, INR in the last 72 hours.    Imaging Studies: Ct Abdomen Pelvis Wo Contrast  Result Date: 10/26/2017 CLINICAL DATA:  Upper abdominal pain for 5 days, no bowel movement for 7 days, high grade bowel obstruction, history breast cancer, coronary artery disease post PTCA, chronic systolic CHF, diabetes mellitus, hypertension, former smoker EXAM: CT ABDOMEN AND PELVIS WITHOUT CONTRAST TECHNIQUE: Multidetector CT imaging of the abdomen and pelvis was performed following the standard protocol without IV contrast. Sagittal and coronal  MPR images reconstructed from axial data set. Patient drank dilute oral contrast for exam COMPARISON:  None FINDINGS: Lower chest: Lung bases clear Hepatobiliary: Gallbladder surgically absent.  Liver unremarkable. Pancreas: Normal appearance Spleen: Normal appearance Adrenals/Urinary Tract: Adrenal glands, kidneys, ureters and bladder normal appearance Stomach/Bowel: Bowel wall thickening of  cecum. Enlarged appendix with thickened wall, overall measuring up to 13 mm diameter. No definite peri appendiceal inflammatory changes. No evidence of perforation or abscess. Ileocecal valve is patent without small bowel dilatation or obstruction. Remaining large and small bowel loops unremarkable. Stomach normal appearance. Vascular/Lymphatic: Scattered atherosclerotic calcifications aorta, iliac arteries and visceral branch vessels. Enlarged peripancreatic lymph node 14 mm short axis image 23. 11 mm portal caval node image 25. Normal and upper normal sized RIGHT cardiophrenic angle lymph nodes. Additional scattered retroperitoneal nodes without additional mobile enlargement. Reproductive: Uterus surgically absent. Nonvisualization of ovaries. Other: No free air free fluid. Tiny umbilical hernia containing fat. Musculoskeletal: Bones demineralized. IMPRESSION: Wall thickening of the cecum with associated enlargement and thickening of the appendix though no definite periappendiceal infiltrative changes are identified. This could represent either very early appendicitis or colitis/typhlitis with secondary thickening of the appendix. Coexistent cecal wall thickening makes a mucocele of the appendix unlikely. Nonspecific minimally enlarged peripancreatic and portacaval lymph nodes. Electronically Signed   By: Lavonia Dana M.D.   On: 10/26/2017 19:50   Dg Chest 2 View  Result Date: 10/04/2017 CLINICAL DATA:  66 y/o F; chest pain. History of breast cancer, bronchitis, tachycardia. EXAM: CHEST  2 VIEW COMPARISON:  09/25/2017 chest radiograph FINDINGS: Stable cardiomegaly. Aortic atherosclerosis with calcification. Diffuse increased interstitial opacities, bibasilar consolidations, and small bilateral effusions. No acute osseous abnormality is evident. IMPRESSION: Increased pulmonary edema and small bilateral pleural effusions. Bibasilar opacities probably represent associated atelectasis. Electronically Signed   By:  Kristine Garbe M.D.   On: 10/04/2017 18:30   Dg Abd 2 Views  Result Date: 10/04/2017 CLINICAL DATA:  Acute onset of shortness of breath and upper abdominal pain. Constipation. EXAM: ABDOMEN - 2 VIEW COMPARISON:  Abdominal radiograph performed 01/23/2017 FINDINGS: The visualized bowel gas pattern is unremarkable. Scattered air and stool filled loops of colon are seen; no abnormal dilatation of small bowel loops is seen to suggest small bowel obstruction. No free intra-abdominal air is identified on the provided upright view. The visualized osseous structures are within normal limits; the sacroiliac joints are unremarkable in appearance. Small bilateral pleural effusions are noted. Associated bibasilar opacities may reflect atelectasis or pneumonia. IMPRESSION: 1. Unremarkable bowel gas pattern; no free intra-abdominal air seen. Small to moderate amount of stool noted in the colon. 2. Small bilateral pleural effusions, with associated bibasilar opacities possibly reflecting atelectasis or pneumonia. Electronically Signed   By: Garald Balding M.D.   On: 10/04/2017 22:50  [4 week]   Impression: 66 y/o female with numerous medical problems who has one week history of abdominal pain, nausea. CT findings two days ago with wall thickening of cecum and associated enlargement and thickening of the appendix but no definite periappendiceal infiltrative changes. Patient has had no diarrhea making infectious colitis less likely. On exam she is tender in the epigastric and RLQ regions, moderate to severe tenderness with some guarding, no rebound. Unable to advance diet with nausea and pain.   She is on ASA daily. No other NSAIDS. Epigastric pain may be separate from colon findings on CT. ?gastritis, PUD.   Plan: 1. Agree with PPI trial.  2. Continue cipro/flagyl for now.  3. Will  schedule zofran. 4. If abdominal pain progress or fails to improve, she may require repeat CT. 5. Possible upper endoscopy  at later date if persistent unexplained symptoms.   We would like to thank you for the opportunity to participate in the care of Kristen Garner.  Laureen Ochs. Bernarda Caffey [facility] [phone] 12/5/20184:09 PM     LOS: 2 days

## 2017-10-29 ENCOUNTER — Inpatient Hospital Stay (HOSPITAL_COMMUNITY): Payer: PPO

## 2017-10-29 DIAGNOSIS — I471 Supraventricular tachycardia: Secondary | ICD-10-CM

## 2017-10-29 DIAGNOSIS — I10 Essential (primary) hypertension: Secondary | ICD-10-CM

## 2017-10-29 DIAGNOSIS — R1013 Epigastric pain: Secondary | ICD-10-CM

## 2017-10-29 LAB — MAGNESIUM: Magnesium: 1.9 mg/dL (ref 1.7–2.4)

## 2017-10-29 LAB — HEMOGLOBIN A1C
Hgb A1c MFr Bld: 6.8 % — ABNORMAL HIGH (ref 4.8–5.6)
Mean Plasma Glucose: 148.46 mg/dL

## 2017-10-29 LAB — GLUCOSE, CAPILLARY
GLUCOSE-CAPILLARY: 131 mg/dL — AB (ref 65–99)
GLUCOSE-CAPILLARY: 116 mg/dL — AB (ref 65–99)
Glucose-Capillary: 106 mg/dL — ABNORMAL HIGH (ref 65–99)
Glucose-Capillary: 134 mg/dL — ABNORMAL HIGH (ref 65–99)

## 2017-10-29 LAB — BASIC METABOLIC PANEL
Anion gap: 10 (ref 5–15)
BUN: 33 mg/dL — AB (ref 6–20)
CHLORIDE: 96 mmol/L — AB (ref 101–111)
CO2: 27 mmol/L (ref 22–32)
CREATININE: 1.69 mg/dL — AB (ref 0.44–1.00)
Calcium: 8.9 mg/dL (ref 8.9–10.3)
GFR calc Af Amer: 35 mL/min — ABNORMAL LOW (ref >60)
GFR calc non Af Amer: 30 mL/min — ABNORMAL LOW (ref >60)
GLUCOSE: 107 mg/dL — AB (ref 65–99)
POTASSIUM: 3.5 mmol/L (ref 3.5–5.1)
SODIUM: 133 mmol/L — AB (ref 135–145)

## 2017-10-29 MED ORDER — IOPAMIDOL (ISOVUE-300) INJECTION 61%
30.0000 mL | Freq: Once | INTRAVENOUS | Status: AC | PRN
  Administered 2017-10-29: 30 mL via ORAL

## 2017-10-29 MED ORDER — IOPAMIDOL (ISOVUE-300) INJECTION 61%
75.0000 mL | Freq: Once | INTRAVENOUS | Status: AC | PRN
  Administered 2017-10-29: 75 mL via INTRAVENOUS

## 2017-10-29 NOTE — Progress Notes (Signed)
PROGRESS NOTE  Kristen Garner LKG:[id number] DOB: 06-Sep-1951 DOA: 10/26/2017 PCP: Sharilyn Sites, MD  Brief History:  65 year old female with a history of systolic and diastolic CHF, CKD stage III, right-sided breast cancer, coronary artery disease, diabetes mellitus, hypertension, hyperlipidemia, anxiety/depression presented with epigastric abdominal pain 4 days prior to admission with intractable nausea and decreased oral intake.  She denied any fever, chills, vomiting, diarrhea, hematochezia, melena, dysuria, hematuria.  She denies any new medications.  CT of the abdomen and pelvis at the time of admission revealed cecal wall thickening with associated enlargement and thickening of the appendix without periappendiceal inflammatory changes.  General surgery was consulted.  He did not feel the patient had appendicitis.  The patient has remained afebrile hemodynamically stable without leukocytosis.  She was started on Cipro and Flagyl.  Unfortunately, the patient continues to have epigastric discomfort and intractable nausea.  Her diet has not been able to be advanced.  Subsequently, GI was consulted to assist with management.  Repeat CT of the abdomen and pelvis was performed on October 29, 2017.  Assessment/Plan: Cecitis/Colitis -Continue ciprofloxacin and Flagyl for now -Appreciate general surgery consult--no acute surgical intervention needed.  Does not feel patient has acute appendicitis -Remains afebrile hemodynamically stable  Intractable abdominal pain and nausea -Start Protonix twice daily -GI consult appreciated-->repeat CT abd/pelvis -12/6 repeat CT abd--edematous appearing appendix without corresponding adjacent inflammatory changes worrisome for possible chronic appendicitis. No evidence of mucocele is seen.  base of cecum appears more normal with no evidence of edema -cannot tolerate diet advancement -hesitate to start reglan due to prolonged QTc with  amiodarone -continue scheduled zofran  Chronic systolic and diastolic CHF -September 27, 2017 echo EF 20-25%, grade 2 DD, diffuse HK, mild TR, moderate MR -Appears clinically euvolemic -Holding Lasix temporarily -Daily weights  Atrial tachycardia -Continue amiodarone, metoprolol succinate  Acute on chronic renal failure --CKD stage III -Baseline creatinine 1.2-1.5 -Serum creatinine peaked at 2.04 -Monitor BMP  Coronary artery disease -No chest pain presently -status post DES x2 to the RCA in November 2017 -Continue aspirin and Plavix  Hyperlipidemia -Continue statin  Diabetes mellitus type 2 -Continue NovoLog sliding scale -Hemoglobin A1c--6.8   Disposition Plan:   Home when cleared by GI/surgery Family Communication:  No Family at bedside  Consultants:  GI  Code Status:  FULL   DVT Prophylaxis:  [place] Heparin   Procedures: As Listed in Progress Note Above  Antibiotics: Ciprofloxacin 12/3>>> Metronidazole 12/3>>>      Subjective: Patient continues to feel nauseous without emesis.  She denies any fevers, chills, headache, chest pain, shortness breath, diarrhea.  She had a bowel movement yesterday without hematochezia or melena.  There is no dysuria or hematuria.  She feels that her abdominal pain is somewhat better than yesterday but still feels quite nauseous.  Objective: Vitals:   10/28/17 2102 10/28/17 2209 10/29/17 0500 10/29/17 0800  BP: (!) 170/62 (!) 158/73 (!) 109/43 (!) 149/56  Pulse: (!) 55 (!) 55 60 64  Resp:  20 18 18   Temp:  (!) 97.3 F (36.3 C) 97.8 F (36.6 C) 97.6 F (36.4 C)  TempSrc:  Oral Oral Oral  SpO2:  99% 96% 98%  Weight:   77.3 kg (170 lb 8 oz)   Height:        Intake/Output Summary (Last 24 hours) at 10/29/2017 1727 Last data filed at 10/29/2017 0751 Gross per 24 hour  Intake 240 ml  Output 400 ml  Net -160 ml   Weight change:  Exam:   General:  Pt is alert, follows commands appropriately, not in acute  distress  HEENT: No icterus, No thrush, No neck mass, [place]/AT  Cardiovascular: RRR, S1/S2, no rubs, no gallops  Respiratory: CTA bilaterally, no wheezing, no crackles, no rhonchi  Abdomen: Soft/+BS, RLQ  tender, non distended, no guarding  Extremities: No edema, No lymphangitis, No petechiae, No rashes, no synovitis   Data Reviewed: I have personally reviewed following labs and imaging studies Basic Metabolic Panel: Recent Labs  Lab 10/26/17 1407 10/27/17 0327 10/28/17 0421 10/29/17 0444  NA 135 135 134* 133*  K 4.2 3.7 3.5 3.5  CL 91* 97* 98* 96*  CO2 31 26 24 27   GLUCOSE 189* 135* 135* 107*  BUN 54* 51* 46* 33*  CREATININE 2.04* 1.85* 1.72* 1.69*  CALCIUM 9.8 9.0 9.0 8.9  MG  --   --   --  1.9   Liver Function Tests: Recent Labs  Lab 10/26/17 1407 10/27/17 0327  AST 26 19  ALT 24 19  ALKPHOS 141* 110  BILITOT 0.7 0.6  PROT 8.7* 7.2  ALBUMIN 5.1* 4.2   Recent Labs  Lab 10/26/17 1407  LIPASE 28   No results for input(s): AMMONIA in the last 168 hours. Coagulation Profile: No results for input(s): INR, PROTIME in the last 168 hours. CBC: Recent Labs  Lab 10/26/17 1407 10/27/17 0327 10/28/17 0421  WBC 9.5 7.6 6.1  NEUTROABS  --  6.0  --   HGB 14.9 13.2 12.8  HCT 46.7* 40.7 39.3  MCV 90.5 90.6 90.3  PLT 325 224 221   Cardiac Enzymes: No results for input(s): CKTOTAL, CKMB, CKMBINDEX, TROPONINI in the last 168 hours. BNP: Invalid input(s): POCBNP CBG: Recent Labs  Lab 10/28/17 1618 10/28/17 2206 10/29/17 0733 10/29/17 1101 10/29/17 1715  GLUCAP 182* 148* 106* 134* 131*   HbA1C: Recent Labs    10/29/17 0444  HGBA1C 6.8*   Urine analysis:    Component Value Date/Time   COLORURINE YELLOW 10/26/2017 1310   APPEARANCEUR CLEAR 10/26/2017 1310   LABSPEC 1.015 10/26/2017 1310   PHURINE 5.0 10/26/2017 1310   GLUCOSEU NEGATIVE 10/26/2017 1310   HGBUR NEGATIVE 10/26/2017 1310   BILIRUBINUR NEGATIVE 10/26/2017 1310   KETONESUR NEGATIVE  10/26/2017 1310   PROTEINUR NEGATIVE 10/26/2017 1310   UROBILINOGEN 0.2 04/04/2013 2238   NITRITE NEGATIVE 10/26/2017 1310   LEUKOCYTESUR TRACE (A) 10/26/2017 1310   Sepsis Labs: @LABRCNTIP (procalcitonin:4,lacticidven:4) )No results found for this or any previous visit (from the past 240 hour(s)).   Scheduled Meds: . amiodarone  200 mg Oral BID  . aspirin EC  81 mg Oral Daily  . clopidogrel  75 mg Oral Daily  . escitalopram  10 mg Oral Daily  . fluticasone  2 spray Each Nare Daily  . heparin  5,000 Units Subcutaneous Q8H  . insulin aspart  0-9 Units Subcutaneous TID WC  . metoprolol succinate  100 mg Oral BID  . ondansetron (ZOFRAN) IV  4 mg Intravenous Q6H  . pantoprazole (PROTONIX) IV  40 mg Intravenous Q12H  . rOPINIRole  1 mg Oral QHS  . rosuvastatin  5 mg Oral q1800  . zolpidem  5 mg Oral QHS   Continuous Infusions: . ciprofloxacin Stopped (10/29/17 1124)  . famotidine (PEPCID) IV Stopped (10/29/17 1054)  . metronidazole Stopped (10/29/17 1416)    Procedures/Studies: Ct Abdomen Pelvis Wo Contrast  Result Date: 10/26/2017 CLINICAL DATA:  Upper abdominal pain for 5 days,  no bowel movement for 7 days, high grade bowel obstruction, history breast cancer, coronary artery disease post PTCA, chronic systolic CHF, diabetes mellitus, hypertension, former smoker EXAM: CT ABDOMEN AND PELVIS WITHOUT CONTRAST TECHNIQUE: Multidetector CT imaging of the abdomen and pelvis was performed following the standard protocol without IV contrast. Sagittal and coronal MPR images reconstructed from axial data set. Patient drank dilute oral contrast for exam COMPARISON:  None FINDINGS: Lower chest: Lung bases clear Hepatobiliary: Gallbladder surgically absent.  Liver unremarkable. Pancreas: Normal appearance Spleen: Normal appearance Adrenals/Urinary Tract: Adrenal glands, kidneys, ureters and bladder normal appearance Stomach/Bowel: Bowel wall thickening of cecum. Enlarged appendix with thickened wall,  overall measuring up to 13 mm diameter. No definite peri appendiceal inflammatory changes. No evidence of perforation or abscess. Ileocecal valve is patent without small bowel dilatation or obstruction. Remaining large and small bowel loops unremarkable. Stomach normal appearance. Vascular/Lymphatic: Scattered atherosclerotic calcifications aorta, iliac arteries and visceral branch vessels. Enlarged peripancreatic lymph node 14 mm short axis image 23. 11 mm portal caval node image 25. Normal and upper normal sized RIGHT cardiophrenic angle lymph nodes. Additional scattered retroperitoneal nodes without additional mobile enlargement. Reproductive: Uterus surgically absent. Nonvisualization of ovaries. Other: No free air free fluid. Tiny umbilical hernia containing fat. Musculoskeletal: Bones demineralized. IMPRESSION: Wall thickening of the cecum with associated enlargement and thickening of the appendix though no definite periappendiceal infiltrative changes are identified. This could represent either very early appendicitis or colitis/typhlitis with secondary thickening of the appendix. Coexistent cecal wall thickening makes a mucocele of the appendix unlikely. Nonspecific minimally enlarged peripancreatic and portacaval lymph nodes. Electronically Signed   By: Lavonia Dana M.D.   On: 10/26/2017 19:50   Dg Chest 2 View  Result Date: 10/04/2017 CLINICAL DATA:  66 y/o F; chest pain. History of breast cancer, bronchitis, tachycardia. EXAM: CHEST  2 VIEW COMPARISON:  09/25/2017 chest radiograph FINDINGS: Stable cardiomegaly. Aortic atherosclerosis with calcification. Diffuse increased interstitial opacities, bibasilar consolidations, and small bilateral effusions. No acute osseous abnormality is evident. IMPRESSION: Increased pulmonary edema and small bilateral pleural effusions. Bibasilar opacities probably represent associated atelectasis. Electronically Signed   By: Kristine Garbe M.D.   On: 10/04/2017  18:30   Ct Abdomen Pelvis W Contrast  Addendum Date: 10/29/2017   ADDENDUM REPORT: 10/29/2017 17:07 ADDENDUM: Correction to the IMPRESSION: The first impression should read " edematous appearing appendix without corresponding adjacent inflammatory changes worrisome for possible chronic appendicitis. No evidence of mucocele is seen. " Electronically Signed   By: Ivar Drape M.D.   On: 10/29/2017 17:07   Result Date: 10/29/2017 CLINICAL DATA:  Right lower quadrant and epigastric tenderness over the last week, history of breast carcinoma EXAM: CT ABDOMEN AND PELVIS WITH CONTRAST TECHNIQUE: Multidetector CT imaging of the abdomen and pelvis was performed using the standard protocol following bolus administration of intravenous contrast. CONTRAST:  67mL ISOVUE-300 IOPAMIDOL (ISOVUE-300) INJECTION 61%, 31mL ISOVUE-300 IOPAMIDOL (ISOVUE-300) INJECTION 61% COMPARISON:  CT abdomen pelvis of 10/26/2017 FINDINGS: Lower chest: The lung bases are clear. Cardiomegaly is stable. No pericardial effusion is seen. Diffuse coronary artery calcifications are noted. Hepatobiliary: The liver enhances with no focal abnormality and no ductal dilatation is seen. The patient appears to undergone prior cholecystectomy. Pancreas: The pancreas is normal in size in the pancreatic duct is not dilated. Spleen: The spleen is unremarkable. Adrenals/Urinary Tract: The adrenal glands appear normal. The kidneys enhance with no calculus or mass. On delayed images the pelvocaliceal systems are unremarkable. The ureters appear normal in caliber. Urinary  bladder is not well distended but no abnormality is seen. Stomach/Bowel: The stomach is slightly distended with oral contrast. No small bowel distention is noted. No definite edema is currently seen involving the base of the cecum as questioned previously. However, the appendix remains prominent measuring up to 12 mm in diameter. Although once again there is no evidence of very appendiceal  strandiness to indicate an acute inflammatory process, there is mucosal edema of the appendix and the somewhat rigid appearance is worrisome for possible chronic appendicitis. Vascular/Lymphatic: Significant abdominal aortic atherosclerosis is noted. There are a few small mesenteric and retroperitoneal nodes present none of which are pathologically enlarged. Reproductive: The uterus has previously been resected. No adnexal lesion is seen. No fluid is noted within the pelvis. Other: There are scattered foci of density within soft tissues subcutaneously possibly due to subacute shots. Musculoskeletal: The lumbar vertebrae are normal alignment with relatively normal intervertebral disc spaces appear IMPRESSION: 1. Edematous gallbladder without corresponding adjacent inflammatory changes, worrisome for possible chronic appendicitis. No evidence of mucocele is seen. 2. The base of thececum appears more normal with no evidence of edema. 3. Diffuse coronary artery calcifications and abdominal aortic atherosclerosis. Electronically Signed: By: Ivar Drape M.D. On: 10/29/2017 16:21   Dg Abd 2 Views  Result Date: 10/04/2017 CLINICAL DATA:  Acute onset of shortness of breath and upper abdominal pain. Constipation. EXAM: ABDOMEN - 2 VIEW COMPARISON:  Abdominal radiograph performed 01/23/2017 FINDINGS: The visualized bowel gas pattern is unremarkable. Scattered air and stool filled loops of colon are seen; no abnormal dilatation of small bowel loops is seen to suggest small bowel obstruction. No free intra-abdominal air is identified on the provided upright view. The visualized osseous structures are within normal limits; the sacroiliac joints are unremarkable in appearance. Small bilateral pleural effusions are noted. Associated bibasilar opacities may reflect atelectasis or pneumonia. IMPRESSION: 1. Unremarkable bowel gas pattern; no free intra-abdominal air seen. Small to moderate amount of stool noted in the colon. 2.  Small bilateral pleural effusions, with associated bibasilar opacities possibly reflecting atelectasis or pneumonia. Electronically Signed   By: Garald Balding M.D.   On: 10/04/2017 22:50    Orson Eva, DO  [facility] Pager [phone]  If 7PM-7AM, please contact night-coverage [email or web address] Password [email or web address] 10/29/2017, 5:27 PM   LOS: 3 days

## 2017-10-29 NOTE — Care Management Note (Signed)
Case Management Note  Patient Details  Name: Kristen Garner MRN: [id number] Date of Birth: May 04, 1951  Subjective/Objective:      Admitted with colitis. Pt is from home, lives with spouse and is ind with ADL's. She drives, has PCP and insurance with drug coverage. She reports no needs pta and has no concerns about discharging home with self care. Pt having difficulty time tolerating oral contrast, attempting to drink during my assessment. She says she feels horrible.                Action/Plan: Anticipate DC home with self care. CM will cont to follow.   Expected Discharge Date:     11/01/2017             Expected Discharge Plan:  Home/Self Care  In-House Referral:  NA  Discharge planning Services  NA  Post Acute Care Choice:  NA Choice offered to:  NA  Status of Service:  In process, will continue to follow  Sherald Barge, RN 10/29/2017, 2:11 PM

## 2017-10-29 NOTE — Progress Notes (Signed)
Vascular came in last night 12/5 around 2100 to place double lumen picc. Picc was not charted. I charted PICC but did not place.

## 2017-10-29 NOTE — Progress Notes (Signed)
Subjective:  Persistent epigastric pain, RLQ pain, nausea improved. Maybe slight improvement in pain.  Objective: Vital signs in last 24 hours: Temp:  [97.3 F (36.3 C)-98.1 F (36.7 C)] 97.6 F (36.4 C) (12/06 0800) Pulse Rate:  [55-74] 64 (12/06 0800) Resp:  [16-20] 18 (12/06 0800) BP: (109-170)/(43-73) 149/56 (12/06 0800) SpO2:  [96 %-99 %] 98 % (12/06 0800) Weight:  [168 lb 11.2 oz (76.5 kg)-170 lb 8 oz (77.3 kg)] 170 lb 8 oz (77.3 kg) (12/06 0500) Last BM Date: 10/28/17 General:   Alert,  Well-developed, well-nourished, pleasant and cooperative in NAD. Appears more comfortable this morning.  Head:  Normocephalic and atraumatic. Eyes:  Sclera clear, no icterus.  Abdomen:  Soft, moderate epigastric and RLQ tenderness. Nondistended.  Normal bowel sounds, without guarding, and without rebound.   Extremities:  Without clubbing, deformity or edema. Neurologic:  Alert and  oriented x4;  grossly normal neurologically. Skin:  Intact without significant lesions or rashes. Psych:  Alert and cooperative. Normal mood and affect.  Intake/Output from previous day: 12/05 0701 - 12/06 0700 In: 480 [P.O.:480] Out: 900 [Urine:900] Intake/Output this shift: Total I/O In: -  Out: 200 [Urine:200]  Lab Results: CBC Recent Labs    10/26/17 1407 10/27/17 0327 10/28/17 0421  WBC 9.5 7.6 6.1  HGB 14.9 13.2 12.8  HCT 46.7* 40.7 39.3  MCV 90.5 90.6 90.3  PLT 325 224 221   BMET Recent Labs    10/27/17 0327 10/28/17 0421 10/29/17 0444  NA 135 134* 133*  K 3.7 3.5 3.5  CL 97* 98* 96*  CO2 26 24 27   GLUCOSE 135* 135* 107*  BUN 51* 46* 33*  CREATININE 1.85* 1.72* 1.69*  CALCIUM 9.0 9.0 8.9   LFTs Recent Labs    10/26/17 1407 10/27/17 0327  BILITOT 0.7 0.6  ALKPHOS 141* 110  AST 26 19  ALT 24 19  PROT 8.7* 7.2  ALBUMIN 5.1* 4.2   Recent Labs    10/26/17 1407  LIPASE 28   PT/INR No results for input(s): LABPROT, INR in the last 72 hours.    Imaging Studies: Ct  Abdomen Pelvis Wo Contrast  Result Date: 10/26/2017 CLINICAL DATA:  Upper abdominal pain for 5 days, no bowel movement for 7 days, high grade bowel obstruction, history breast cancer, coronary artery disease post PTCA, chronic systolic CHF, diabetes mellitus, hypertension, former smoker EXAM: CT ABDOMEN AND PELVIS WITHOUT CONTRAST TECHNIQUE: Multidetector CT imaging of the abdomen and pelvis was performed following the standard protocol without IV contrast. Sagittal and coronal MPR images reconstructed from axial data set. Patient drank dilute oral contrast for exam COMPARISON:  None FINDINGS: Lower chest: Lung bases clear Hepatobiliary: Gallbladder surgically absent.  Liver unremarkable. Pancreas: Normal appearance Spleen: Normal appearance Adrenals/Urinary Tract: Adrenal glands, kidneys, ureters and bladder normal appearance Stomach/Bowel: Bowel wall thickening of cecum. Enlarged appendix with thickened wall, overall measuring up to 13 mm diameter. No definite peri appendiceal inflammatory changes. No evidence of perforation or abscess. Ileocecal valve is patent without small bowel dilatation or obstruction. Remaining large and small bowel loops unremarkable. Stomach normal appearance. Vascular/Lymphatic: Scattered atherosclerotic calcifications aorta, iliac arteries and visceral branch vessels. Enlarged peripancreatic lymph node 14 mm short axis image 23. 11 mm portal caval node image 25. Normal and upper normal sized RIGHT cardiophrenic angle lymph nodes. Additional scattered retroperitoneal nodes without additional mobile enlargement. Reproductive: Uterus surgically absent. Nonvisualization of ovaries. Other: No free air free fluid. Tiny umbilical hernia containing fat. Musculoskeletal: Bones demineralized. IMPRESSION: Wall  thickening of the cecum with associated enlargement and thickening of the appendix though no definite periappendiceal infiltrative changes are identified. This could represent either very  early appendicitis or colitis/typhlitis with secondary thickening of the appendix. Coexistent cecal wall thickening makes a mucocele of the appendix unlikely. Nonspecific minimally enlarged peripancreatic and portacaval lymph nodes. Electronically Signed   By: Lavonia Dana M.D.   On: 10/26/2017 19:50   Dg Chest 2 View  Result Date: 10/04/2017 CLINICAL DATA:  66 y/o F; chest pain. History of breast cancer, bronchitis, tachycardia. EXAM: CHEST  2 VIEW COMPARISON:  09/25/2017 chest radiograph FINDINGS: Stable cardiomegaly. Aortic atherosclerosis with calcification. Diffuse increased interstitial opacities, bibasilar consolidations, and small bilateral effusions. No acute osseous abnormality is evident. IMPRESSION: Increased pulmonary edema and small bilateral pleural effusions. Bibasilar opacities probably represent associated atelectasis. Electronically Signed   By: Kristine Garbe M.D.   On: 10/04/2017 18:30   Dg Abd 2 Views  Result Date: 10/04/2017 CLINICAL DATA:  Acute onset of shortness of breath and upper abdominal pain. Constipation. EXAM: ABDOMEN - 2 VIEW COMPARISON:  Abdominal radiograph performed 01/23/2017 FINDINGS: The visualized bowel gas pattern is unremarkable. Scattered air and stool filled loops of colon are seen; no abnormal dilatation of small bowel loops is seen to suggest small bowel obstruction. No free intra-abdominal air is identified on the provided upright view. The visualized osseous structures are within normal limits; the sacroiliac joints are unremarkable in appearance. Small bilateral pleural effusions are noted. Associated bibasilar opacities may reflect atelectasis or pneumonia. IMPRESSION: 1. Unremarkable bowel gas pattern; no free intra-abdominal air seen. Small to moderate amount of stool noted in the colon. 2. Small bilateral pleural effusions, with associated bibasilar opacities possibly reflecting atelectasis or pneumonia. Electronically Signed   By: Garald Balding M.D.   On: 10/04/2017 22:50  [2 weeks]   Assessment:  66 year old female with multiple medical problems as previously outlined who presented with one-week history of abdominal pain, nausea.  CT scan at time of admission with wall thickening of cecum and associated enlargement and thickening of the appendix but no definite periappendiceal infiltrative changes. Patient has had no diarrhea making infectious colitis less likely. On exam she is tender in the epigastric and RLQ regions, moderate to severe tenderness with some guarding, no rebound. Unable to advance diet with nausea and pain.  Question epigastric pain separate issue from colon ie gastritis versus peptic ulcer disease.  Plan: 1. Repeat CT scan today, delayed due to lack of IV access last night. Spoke with CT, patient to receive reduced dose IV contrast. 2. Continue Cipro/Flagyl for now. 3. PPI.  Scheduled Zofran. 4. We will not rule out need for upper endoscopy this admission if symptoms continue to be unexplained.  Laureen Ochs. Bernarda Caffey [facility] [phone] 12/6/20189:02 AM     LOS: 3 days

## 2017-10-30 ENCOUNTER — Other Ambulatory Visit: Payer: Self-pay | Admitting: *Deleted

## 2017-10-30 DIAGNOSIS — N179 Acute kidney failure, unspecified: Secondary | ICD-10-CM

## 2017-10-30 DIAGNOSIS — I5042 Chronic combined systolic (congestive) and diastolic (congestive) heart failure: Secondary | ICD-10-CM

## 2017-10-30 DIAGNOSIS — K219 Gastro-esophageal reflux disease without esophagitis: Secondary | ICD-10-CM

## 2017-10-30 DIAGNOSIS — K36 Other appendicitis: Principal | ICD-10-CM

## 2017-10-30 DIAGNOSIS — E860 Dehydration: Secondary | ICD-10-CM

## 2017-10-30 LAB — CBC
HCT: 38.9 % (ref 36.0–46.0)
HEMOGLOBIN: 12.6 g/dL (ref 12.0–15.0)
MCH: 28.9 pg (ref 26.0–34.0)
MCHC: 32.4 g/dL (ref 30.0–36.0)
MCV: 89.2 fL (ref 78.0–100.0)
PLATELETS: 299 10*3/uL (ref 150–400)
RBC: 4.36 MIL/uL (ref 3.87–5.11)
RDW: 13 % (ref 11.5–15.5)
WBC: 8.4 10*3/uL (ref 4.0–10.5)

## 2017-10-30 LAB — GLUCOSE, CAPILLARY
GLUCOSE-CAPILLARY: 125 mg/dL — AB (ref 65–99)
GLUCOSE-CAPILLARY: 158 mg/dL — AB (ref 65–99)
Glucose-Capillary: 119 mg/dL — ABNORMAL HIGH (ref 65–99)
Glucose-Capillary: 112 mg/dL — ABNORMAL HIGH (ref 65–99)

## 2017-10-30 LAB — BASIC METABOLIC PANEL WITH GFR
Anion gap: 9 (ref 5–15)
BUN: 27 mg/dL — ABNORMAL HIGH (ref 6–20)
CO2: 27 mmol/L (ref 22–32)
Calcium: 8.9 mg/dL (ref 8.9–10.3)
Chloride: 95 mmol/L — ABNORMAL LOW (ref 101–111)
Creatinine, Ser: 1.68 mg/dL — ABNORMAL HIGH (ref 0.44–1.00)
GFR calc Af Amer: 36 mL/min — ABNORMAL LOW
GFR calc non Af Amer: 31 mL/min — ABNORMAL LOW
Glucose, Bld: 120 mg/dL — ABNORMAL HIGH (ref 65–99)
Potassium: 3.4 mmol/L — ABNORMAL LOW (ref 3.5–5.1)
Sodium: 131 mmol/L — ABNORMAL LOW (ref 135–145)

## 2017-10-30 MED ORDER — ALUM & MAG HYDROXIDE-SIMETH 200-200-20 MG/5ML PO SUSP: 15.0000 mL | ORAL | Status: DC | PRN

## 2017-10-30 MED ORDER — ONDANSETRON HCL 4 MG/2ML IJ SOLN
4.0000 mg | Freq: Three times a day (TID) | INTRAMUSCULAR | Status: DC
  Administered 2017-10-30 – 2017-10-31 (×3): 4 mg via INTRAVENOUS
  Filled 2017-10-30: qty 2

## 2017-10-30 MED ORDER — LIDOCAINE VISCOUS 2 % MT SOLN
10.0000 mL | Freq: Three times a day (TID) | OROMUCOSAL | Status: DC
  Administered 2017-10-30 (×2): 10 mL via OROMUCOSAL
  Filled 2017-10-30 (×2): qty 15

## 2017-10-30 MED ORDER — PANTOPRAZOLE SODIUM 40 MG PO TBEC
40.0000 mg | DELAYED_RELEASE_TABLET | Freq: Two times a day (BID) | ORAL | Status: DC
  Administered 2017-10-30: 40 mg via ORAL
  Filled 2017-10-30: qty 1

## 2017-10-30 MED ORDER — SODIUM CHLORIDE 0.9 % IV SOLN
INTRAVENOUS | Status: DC
  Administered 2017-10-30: 17:00:00 via INTRAVENOUS

## 2017-10-30 NOTE — Progress Notes (Signed)
Subjective: Describes persistent abdominal pain generalized abdomen, worse in the epigastric area.  She did have a bowel movement today which was runny but no blood.  Persistent nausea.  She is requesting more pain medicines to help with her abdominal pain.  No other GI complaints at this time.  Objective: Vital signs in last 24 hours: Temp:  [97.7 F (36.5 C)-97.9 F (36.6 C)] 97.7 F (36.5 C) (12/07 0452) Pulse Rate:  [58-66] 66 (12/07 0452) Resp:  [18] 18 (12/07 0452) BP: (144-157)/(64-67) 157/67 (12/07 0452) SpO2:  [95 %-100 %] 95 % (12/07 0452) Weight:  [171 lb 15.3 oz (78 kg)] 171 lb 15.3 oz (78 kg) (12/07 0452) Last BM Date: 10/28/17 General:   Alert and oriented, pleasant. Appears in pain/fatigued Head:  Normocephalic and atraumatic. Eyes:  No icterus, sclera clear. Conjuctiva pink.  Heart:  S1, S2 present, no murmurs noted.  Lungs: Clear to auscultation bilaterally, without wheezing, rales, or rhonchi.  Abdomen:  Bowel sounds present, soft, non-distended. Moderate TTP generalized abdomen, worse epigastric. No HSM or hernias noted. No rebound or guarding. No masses appreciated  Msk:  Symmetrical without gross deformities. Pulses:  Normal bilateral DP pulses noted. Extremities:  Without clubbing or edema. Psych:  Alert and cooperative. Normal mood and affect.  Intake/Output from previous day: 12/06 0701 - 12/07 0700 In: -  Out: 200 [Urine:200] Intake/Output this shift: No intake/output data recorded.  Lab Results: Recent Labs    10/28/17 0421 10/30/17 0450  WBC 6.1 8.4  HGB 12.8 12.6  HCT 39.3 38.9  PLT 221 299   BMET Recent Labs    10/28/17 0421 10/29/17 0444 10/30/17 0450  NA 134* 133* 131*  K 3.5 3.5 3.4*  CL 98* 96* 95*  CO2 24 27 27   GLUCOSE 135* 107* 120*  BUN 46* 33* 27*  CREATININE 1.72* 1.69* 1.68*  CALCIUM 9.0 8.9 8.9   LFT No results for input(s): PROT, ALBUMIN, AST, ALT, ALKPHOS, BILITOT, BILIDIR, IBILI in the last 72  hours. PT/INR No results for input(s): LABPROT, INR in the last 72 hours. Hepatitis Panel No results for input(s): HEPBSAG, HCVAB, HEPAIGM, HEPBIGM in the last 72 hours.   Studies/Results: Ct Abdomen Pelvis W Contrast  Addendum Date: 10/29/2017   ADDENDUM REPORT: 10/29/2017 17:07 ADDENDUM: Correction to the IMPRESSION: The first impression should read " edematous appearing appendix without corresponding adjacent inflammatory changes worrisome for possible chronic appendicitis. No evidence of mucocele is seen. " Electronically Signed   By: Ivar Drape M.D.   On: 10/29/2017 17:07   Result Date: 10/29/2017 CLINICAL DATA:  Right lower quadrant and epigastric tenderness over the last week, history of breast carcinoma EXAM: CT ABDOMEN AND PELVIS WITH CONTRAST TECHNIQUE: Multidetector CT imaging of the abdomen and pelvis was performed using the standard protocol following bolus administration of intravenous contrast. CONTRAST:  33mL ISOVUE-300 IOPAMIDOL (ISOVUE-300) INJECTION 61%, 19mL ISOVUE-300 IOPAMIDOL (ISOVUE-300) INJECTION 61% COMPARISON:  CT abdomen pelvis of 10/26/2017 FINDINGS: Lower chest: The lung bases are clear. Cardiomegaly is stable. No pericardial effusion is seen. Diffuse coronary artery calcifications are noted. Hepatobiliary: The liver enhances with no focal abnormality and no ductal dilatation is seen. The patient appears to undergone prior cholecystectomy. Pancreas: The pancreas is normal in size in the pancreatic duct is not dilated. Spleen: The spleen is unremarkable. Adrenals/Urinary Tract: The adrenal glands appear normal. The kidneys enhance with no calculus or mass. On delayed images the pelvocaliceal systems are unremarkable. The ureters appear normal in caliber. Urinary bladder  is not well distended but no abnormality is seen. Stomach/Bowel: The stomach is slightly distended with oral contrast. No small bowel distention is noted. No definite edema is currently seen involving the  base of the cecum as questioned previously. However, the appendix remains prominent measuring up to 12 mm in diameter. Although once again there is no evidence of very appendiceal strandiness to indicate an acute inflammatory process, there is mucosal edema of the appendix and the somewhat rigid appearance is worrisome for possible chronic appendicitis. Vascular/Lymphatic: Significant abdominal aortic atherosclerosis is noted. There are a few small mesenteric and retroperitoneal nodes present none of which are pathologically enlarged. Reproductive: The uterus has previously been resected. No adnexal lesion is seen. No fluid is noted within the pelvis. Other: There are scattered foci of density within soft tissues subcutaneously possibly due to subacute shots. Musculoskeletal: The lumbar vertebrae are normal alignment with relatively normal intervertebral disc spaces appear IMPRESSION: 1. Edematous gallbladder without corresponding adjacent inflammatory changes, worrisome for possible chronic appendicitis. No evidence of mucocele is seen. 2. The base of thececum appears more normal with no evidence of edema. 3. Diffuse coronary artery calcifications and abdominal aortic atherosclerosis. Electronically Signed: By: Ivar Drape M.D. On: 10/29/2017 16:21    Assessment: 66 year old female with multiple medical problems as previously outlined who presented with one-week history of abdominal pain, nausea.  CT scan at time of admission with wall thickening of cecum and associated enlargement and thickening of the appendix but no definite periappendiceal infiltrative changes. Patient has had no diarrhea making infectious colitis less likely. Question epigastric pain separate issue from colon ie gastritis versus peptic ulcer disease.  Today her pain is persistent as is nausea.  She is requesting further pain medicines.  Her abdominal pain is generalized with worsening in the epigastric area.  She is having a bowel  movement blood.  Repeat CT of the abdomen with contrast yesterday found edematous appendix without corresponding adjacent inflammatory changes worrisome for possible chronic appendicitis, no evidence of mucocele seen.  At the base of the cecum is more normal without evidence of edema.  Surgery is seeing the patient as well.  They are amenable to outpatient, elective appendectomy.  However, they would like a colonoscopy done as an outpatient first to evaluate for any other colon abnormalities that might require more extensive surgery.  CBC today is normal, no leukocytosis or anemia.  Basic metabolic panel found some minor electrolyte abnormalities, stable creatinine at 1.68.  Plan: 1. Continue supportive measures. 2. Change Zofran to scheduled given persistent nausea. 3. Pain management per hospitalist. 4. Long-term plan is for outpatient colonoscopy and possible surgery afterward   Thank you for allowing [place] to participate in the care of Kristen Garrison, DNP, AGNP-C Adult & Gerontological Nurse Practitioner [facility]    LOS: 4 days    10/30/2017, 8:25 AM

## 2017-10-30 NOTE — Progress Notes (Signed)
PROGRESS NOTE  Kristen Garner MLJ:[id number] DOB: May 28, 1951 DOA: 10/26/2017 PCP: Kristen Sites, MD  Brief History: 66 year old female with a history of systolic and diastolic CHF, CKD stage III, right-sided breast cancer, coronary artery disease, diabetes mellitus, hypertension, hyperlipidemia, anxiety/depression presented with epigastric abdominal pain 4 days prior to admission with intractable nausea and decreased oral intake. She denied any fever, chills, vomiting, diarrhea, hematochezia, melena, dysuria, hematuria. She denies any new medications. CT of the abdomen and pelvis at the time of admission revealed cecal wall thickening with associated enlargement and thickening of the appendix without periappendiceal inflammatory changes. General surgery was consulted. He did not feel the patient had appendicitis. The patient has remained afebrile hemodynamically stable without leukocytosis. She was started on Cipro and Flagyl. Unfortunately, the patient continues to have epigastric discomfort and intractable nausea. Her diet has not been able to be advanced.  Subsequently, GI was consulted to assist with management.  Repeat CT of the abdomen and pelvis was performed on October 29, 2017 due to the patient's persistent abdominal pain.  Assessment/Plan: Chronic appendicitis -Continue ciprofloxacin and Flagyl initially-->stopped on 12/7 -Appreciate general surgery consult--no acute surgical intervention needed, but will need elective appendectomy. Doesnot feel patient has acute appendicitis -Gen surgery recommends colonoscopy prior to appy -Remains afebrile hemodynamically stable  Intractable abdominal pain and nausea/dysphagia -Started Protonix twice daily-->still nauseous -GI consult appreciated-->repeat CT abd/pelvis -12/6 repeat CT abd--edematous appearing appendix without corresponding adjacent inflammatory changes worrisome for possible chronic appendicitis. No  evidence of mucocele is seen.  base of cecum appears more normal with no evidence of edema -hesitate to start reglan due to prolonged QTc with amiodarone -continue scheduled zofran -case discussed with Kristen Garner for EGD 12/1  Chronic systolic and diastolic CHF -September 27, 2017 echo EF 20-25%, grade 2 DD, diffuse HK, mild TR, moderate MR -Appears clinically euvolemic -Holding Lasix temporarily -Daily weights  Atrial tachycardia -Continue amiodarone, metoprolol succinate -no episodes this admission  Acute on chronic renal failure--CKD stage III -Baseline creatinine 1.2-1.5 -Serum creatinine peaked at 2.04 -Monitor BMP  Coronary artery disease -No chest pain presently -status post DES x2 to the RCA in November 2017 -Continue aspirin and Plavix  Hyperlipidemia -Continue statin  Diabetes mellitus type 2 -Continue NovoLog sliding scale -Hemoglobin A1c--6.8 -CBGs controlled   Disposition Plan: Home when cleared by GI/surgery Family Communication:NoFamily at bedside  Consultants:GI, general surgery  Code Status: FULL   DVT Prophylaxis: [place] Heparin   Procedures: As Listed in Progress Note Above  Antibiotics: Ciprofloxacin12/3>>>12/7 Metronidazole 12/3>>>12/7      Subjective: The patient complains of some dysphasia today with solid food.  She continues to have nausea without any emesis.  She states her abdominal pain overall is improving.  She denies any fever, chills, chest pain, shortness breath, palpitations, diarrhea, hematochezia, melena.  Objective: Vitals:   10/29/17 0500 10/29/17 0800 10/29/17 2123 10/30/17 0452  BP: (!) 109/43 (!) 149/56 (!) 144/64 (!) 157/67  Pulse: 60 64 (!) 58 66  Resp: 18 18  18   Temp: 97.8 F (36.6 C) 97.6 F (36.4 C) 97.9 F (36.6 C) 97.7 F (36.5 C)  TempSrc: Oral Oral Oral Oral  SpO2: 96% 98% 100% 95%  Weight: 77.3 kg (170 lb 8 oz)   78 kg (171 lb 15.3 oz)  Height:        Intake/Output  Summary (Last 24 hours) at 10/30/2017 1614 Last data filed at 10/30/2017 1248 Gross per 24 hour  Intake  1200 ml  Output 200 ml  Net 1000 ml   Weight change: 1.478 kg (3 lb 4.1 oz) Exam:   General:  Pt is alert, follows commands appropriately, not in acute distress  HEENT: No icterus, No thrush, No neck mass, [place]/AT  Cardiovascular: RRR, S1/S2, no rubs, no gallops  Respiratory: CTA bilaterally, no wheezing, no crackles, no rhonchi  Abdomen: Soft/+BS, non tender, non distended, no guarding  Extremities: No edema, No lymphangitis, No petechiae, No rashes, no synovitis   Data Reviewed: I have personally reviewed following labs and imaging studies Basic Metabolic Panel: Recent Labs  Lab 10/26/17 1407 10/27/17 0327 10/28/17 0421 10/29/17 0444 10/30/17 0450  NA 135 135 134* 133* 131*  K 4.2 3.7 3.5 3.5 3.4*  CL 91* 97* 98* 96* 95*  CO2 31 26 24 27 27   GLUCOSE 189* 135* 135* 107* 120*  BUN 54* 51* 46* 33* 27*  CREATININE 2.04* 1.85* 1.72* 1.69* 1.68*  CALCIUM 9.8 9.0 9.0 8.9 8.9  MG  --   --   --  1.9  --    Liver Function Tests: Recent Labs  Lab 10/26/17 1407 10/27/17 0327  AST 26 19  ALT 24 19  ALKPHOS 141* 110  BILITOT 0.7 0.6  PROT 8.7* 7.2  ALBUMIN 5.1* 4.2   Recent Labs  Lab 10/26/17 1407  LIPASE 28   No results for input(s): AMMONIA in the last 168 hours. Coagulation Profile: No results for input(s): INR, PROTIME in the last 168 hours. CBC: Recent Labs  Lab 10/26/17 1407 10/27/17 0327 10/28/17 0421 10/30/17 0450  WBC 9.5 7.6 6.1 8.4  NEUTROABS  --  6.0  --   --   HGB 14.9 13.2 12.8 12.6  HCT 46.7* 40.7 39.3 38.9  MCV 90.5 90.6 90.3 89.2  PLT 325 224 221 299   Cardiac Enzymes: No results for input(s): CKTOTAL, CKMB, CKMBINDEX, TROPONINI in the last 168 hours. BNP: Invalid input(s): POCBNP CBG: Recent Labs  Lab 10/29/17 1101 10/29/17 1715 10/29/17 2131 10/30/17 0741 10/30/17 1136  GLUCAP 134* 131* 116* 125* 158*   HbA1C: Recent  Labs    10/29/17 0444  HGBA1C 6.8*   Urine analysis:    Component Value Date/Time   COLORURINE YELLOW 10/26/2017 1310   APPEARANCEUR CLEAR 10/26/2017 1310   LABSPEC 1.015 10/26/2017 1310   PHURINE 5.0 10/26/2017 1310   GLUCOSEU NEGATIVE 10/26/2017 1310   HGBUR NEGATIVE 10/26/2017 1310   BILIRUBINUR NEGATIVE 10/26/2017 1310   KETONESUR NEGATIVE 10/26/2017 1310   PROTEINUR NEGATIVE 10/26/2017 1310   UROBILINOGEN 0.2 04/04/2013 2238   NITRITE NEGATIVE 10/26/2017 1310   LEUKOCYTESUR TRACE (A) 10/26/2017 1310   Sepsis Labs: @LABRCNTIP (procalcitonin:4,lacticidven:4) )No results found for this or any previous visit (from the past 240 hour(s)).   Scheduled Meds: . amiodarone  200 mg Oral BID  . aspirin EC  81 mg Oral Daily  . clopidogrel  75 mg Oral Daily  . escitalopram  10 mg Oral Daily  . fluticasone  2 spray Each Nare Daily  . heparin  5,000 Units Subcutaneous Q8H  . insulin aspart  0-9 Units Subcutaneous TID WC  . metoprolol succinate  100 mg Oral BID  . ondansetron (ZOFRAN) IV  4 mg Intravenous Q6H  . pantoprazole (PROTONIX) IV  40 mg Intravenous Q12H  . rOPINIRole  1 mg Oral QHS  . rosuvastatin  5 mg Oral q1800  . zolpidem  5 mg Oral QHS   Continuous Infusions: . ciprofloxacin Stopped (10/30/17 1324)  . metronidazole  Stopped (10/30/17 1507)    Procedures/Studies: Ct Abdomen Pelvis Wo Contrast  Result Date: 10/26/2017 CLINICAL DATA:  Upper abdominal pain for 5 days, no bowel movement for 7 days, high grade bowel obstruction, history breast cancer, coronary artery disease post PTCA, chronic systolic CHF, diabetes mellitus, hypertension, former smoker EXAM: CT ABDOMEN AND PELVIS WITHOUT CONTRAST TECHNIQUE: Multidetector CT imaging of the abdomen and pelvis was performed following the standard protocol without IV contrast. Sagittal and coronal MPR images reconstructed from axial data set. Patient drank dilute oral contrast for exam COMPARISON:  None FINDINGS: Lower chest:  Lung bases clear Hepatobiliary: Gallbladder surgically absent.  Liver unremarkable. Pancreas: Normal appearance Spleen: Normal appearance Adrenals/Urinary Tract: Adrenal glands, kidneys, ureters and bladder normal appearance Stomach/Bowel: Bowel wall thickening of cecum. Enlarged appendix with thickened wall, overall measuring up to 13 mm diameter. No definite peri appendiceal inflammatory changes. No evidence of perforation or abscess. Ileocecal valve is patent without small bowel dilatation or obstruction. Remaining large and small bowel loops unremarkable. Stomach normal appearance. Vascular/Lymphatic: Scattered atherosclerotic calcifications aorta, iliac arteries and visceral branch vessels. Enlarged peripancreatic lymph node 14 mm short axis image 23. 11 mm portal caval node image 25. Normal and upper normal sized RIGHT cardiophrenic angle lymph nodes. Additional scattered retroperitoneal nodes without additional mobile enlargement. Reproductive: Uterus surgically absent. Nonvisualization of ovaries. Other: No free air free fluid. Tiny umbilical hernia containing fat. Musculoskeletal: Bones demineralized. IMPRESSION: Wall thickening of the cecum with associated enlargement and thickening of the appendix though no definite periappendiceal infiltrative changes are identified. This could represent either very early appendicitis or colitis/typhlitis with secondary thickening of the appendix. Coexistent cecal wall thickening makes a mucocele of the appendix unlikely. Nonspecific minimally enlarged peripancreatic and portacaval lymph nodes. Electronically Signed   By: Lavonia Dana M.D.   On: 10/26/2017 19:50   Dg Chest 2 View  Result Date: 10/04/2017 CLINICAL DATA:  66 y/o F; chest pain. History of breast cancer, bronchitis, tachycardia. EXAM: CHEST  2 VIEW COMPARISON:  09/25/2017 chest radiograph FINDINGS: Stable cardiomegaly. Aortic atherosclerosis with calcification. Diffuse increased interstitial opacities,  bibasilar consolidations, and small bilateral effusions. No acute osseous abnormality is evident. IMPRESSION: Increased pulmonary edema and small bilateral pleural effusions. Bibasilar opacities probably represent associated atelectasis. Electronically Signed   By: Kristine Garbe M.D.   On: 10/04/2017 18:30   Ct Abdomen Pelvis W Contrast  Addendum Date: 10/29/2017   ADDENDUM REPORT: 10/29/2017 17:07 ADDENDUM: Correction to the IMPRESSION: The first impression should read " edematous appearing appendix without corresponding adjacent inflammatory changes worrisome for possible chronic appendicitis. No evidence of mucocele is seen. " Electronically Signed   By: Ivar Drape M.D.   On: 10/29/2017 17:07   Result Date: 10/29/2017 CLINICAL DATA:  Right lower quadrant and epigastric tenderness over the last week, history of breast carcinoma EXAM: CT ABDOMEN AND PELVIS WITH CONTRAST TECHNIQUE: Multidetector CT imaging of the abdomen and pelvis was performed using the standard protocol following bolus administration of intravenous contrast. CONTRAST:  17mL ISOVUE-300 IOPAMIDOL (ISOVUE-300) INJECTION 61%, 17mL ISOVUE-300 IOPAMIDOL (ISOVUE-300) INJECTION 61% COMPARISON:  CT abdomen pelvis of 10/26/2017 FINDINGS: Lower chest: The lung bases are clear. Cardiomegaly is stable. No pericardial effusion is seen. Diffuse coronary artery calcifications are noted. Hepatobiliary: The liver enhances with no focal abnormality and no ductal dilatation is seen. The patient appears to undergone prior cholecystectomy. Pancreas: The pancreas is normal in size in the pancreatic duct is not dilated. Spleen: The spleen is unremarkable. Adrenals/Urinary Tract: The adrenal glands  appear normal. The kidneys enhance with no calculus or mass. On delayed images the pelvocaliceal systems are unremarkable. The ureters appear normal in caliber. Urinary bladder is not well distended but no abnormality is seen. Stomach/Bowel: The stomach is  slightly distended with oral contrast. No small bowel distention is noted. No definite edema is currently seen involving the base of the cecum as questioned previously. However, the appendix remains prominent measuring up to 12 mm in diameter. Although once again there is no evidence of very appendiceal strandiness to indicate an acute inflammatory process, there is mucosal edema of the appendix and the somewhat rigid appearance is worrisome for possible chronic appendicitis. Vascular/Lymphatic: Significant abdominal aortic atherosclerosis is noted. There are a few small mesenteric and retroperitoneal nodes present none of which are pathologically enlarged. Reproductive: The uterus has previously been resected. No adnexal lesion is seen. No fluid is noted within the pelvis. Other: There are scattered foci of density within soft tissues subcutaneously possibly due to subacute shots. Musculoskeletal: The lumbar vertebrae are normal alignment with relatively normal intervertebral disc spaces appear IMPRESSION: 1. Edematous gallbladder without corresponding adjacent inflammatory changes, worrisome for possible chronic appendicitis. No evidence of mucocele is seen. 2. The base of thececum appears more normal with no evidence of edema. 3. Diffuse coronary artery calcifications and abdominal aortic atherosclerosis. Electronically Signed: By: Ivar Drape M.D. On: 10/29/2017 16:21   Dg Abd 2 Views  Result Date: 10/04/2017 CLINICAL DATA:  Acute onset of shortness of breath and upper abdominal pain. Constipation. EXAM: ABDOMEN - 2 VIEW COMPARISON:  Abdominal radiograph performed 01/23/2017 FINDINGS: The visualized bowel gas pattern is unremarkable. Scattered air and stool filled loops of colon are seen; no abnormal dilatation of small bowel loops is seen to suggest small bowel obstruction. No free intra-abdominal air is identified on the provided upright view. The visualized osseous structures are within normal limits;  the sacroiliac joints are unremarkable in appearance. Small bilateral pleural effusions are noted. Associated bibasilar opacities may reflect atelectasis or pneumonia. IMPRESSION: 1. Unremarkable bowel gas pattern; no free intra-abdominal air seen. Small to moderate amount of stool noted in the colon. 2. Small bilateral pleural effusions, with associated bibasilar opacities possibly reflecting atelectasis or pneumonia. Electronically Signed   By: Garald Balding M.D.   On: 10/04/2017 22:50    Orson Eva, DO  [facility] Pager [phone]  If 7PM-7AM, please contact night-coverage [email or web address] Password [email or web address] 10/30/2017, 4:14 PM   LOS: 4 days

## 2017-10-30 NOTE — Progress Notes (Signed)
[facility] Progress Note     Subjective: Called by Dr. Gala Romney yesterday evening. Patient continues to have thickened appendix on the CT with IV  Contrast and no signs on imaging of any acute appendicitis and no wbc on admission and prolonged history on admission of 7+ day pain.  Agree potential for chronic appendicitis or some type of appendiceal tumor.    Patient has never had a colonoscopy and had a mother with with colon cancer.    Continues to have some pain in the right side. Says she is trying to eat and that liquids are going ok.   Objective: Vital signs in last 24 hours: Temp:  [97.7 F (36.5 C)-97.9 F (36.6 C)] 97.7 F (36.5 C) (12/07 0452) Pulse Rate:  [58-66] 66 (12/07 0452) Resp:  [18] 18 (12/07 0452) BP: (144-157)/(64-67) 157/67 (12/07 0452) SpO2:  [95 %-100 %] 95 % (12/07 0452) Weight:  [171 lb 15.3 oz (78 kg)] 171 lb 15.3 oz (78 kg) (12/07 0452) Last BM Date: 10/28/17  Intake/Output from previous day: 12/06 0701 - 12/07 0700 In: -  Out: 200 [Urine:200] Intake/Output this shift: No intake/output data recorded.  General appearance: alert, cooperative and no distress Resp: normal work breathing GI: soft, mildly tender Right side abdomen, no rebound or guarding  Lab Results:  Recent Labs    10/28/17 0421 10/30/17 0450  WBC 6.1 8.4  HGB 12.8 12.6  HCT 39.3 38.9  PLT 221 299   BMET Recent Labs    10/29/17 0444 10/30/17 0450  NA 133* 131*  K 3.5 3.4*  CL 96* 95*  CO2 27 27  GLUCOSE 107* 120*  BUN 33* 27*  CREATININE 1.69* 1.68*  CALCIUM 8.9 8.9   PT/INR No results for input(s): LABPROT, INR in the last 72 hours.  Personally reviewed CT- appendix about 12 mm thick, no air in appendix and no contrast, no stranding or other signs of acute inflammation   Studies/Results: Ct Abdomen Pelvis W Contrast  Addendum Date: 10/29/2017   ADDENDUM REPORT: 10/29/2017 17:07 ADDENDUM: Correction to the IMPRESSION: The first impression should  read " edematous appearing appendix without corresponding adjacent inflammatory changes worrisome for possible chronic appendicitis. No evidence of mucocele is seen. " Electronically Signed   By: Ivar Drape M.D.   On: 10/29/2017 17:07   Result Date: 10/29/2017 CLINICAL DATA:  Right lower quadrant and epigastric tenderness over the last week, history of breast carcinoma EXAM: CT ABDOMEN AND PELVIS WITH CONTRAST TECHNIQUE: Multidetector CT imaging of the abdomen and pelvis was performed using the standard protocol following bolus administration of intravenous contrast. CONTRAST:  11mL ISOVUE-300 IOPAMIDOL (ISOVUE-300) INJECTION 61%, 76mL ISOVUE-300 IOPAMIDOL (ISOVUE-300) INJECTION 61% COMPARISON:  CT abdomen pelvis of 10/26/2017 FINDINGS: Lower chest: The lung bases are clear. Cardiomegaly is stable. No pericardial effusion is seen. Diffuse coronary artery calcifications are noted. Hepatobiliary: The liver enhances with no focal abnormality and no ductal dilatation is seen. The patient appears to undergone prior cholecystectomy. Pancreas: The pancreas is normal in size in the pancreatic duct is not dilated. Spleen: The spleen is unremarkable. Adrenals/Urinary Tract: The adrenal glands appear normal. The kidneys enhance with no calculus or mass. On delayed images the pelvocaliceal systems are unremarkable. The ureters appear normal in caliber. Urinary bladder is not well distended but no abnormality is seen. Stomach/Bowel: The stomach is slightly distended with oral contrast. No small bowel distention is noted. No definite edema is currently seen involving the base of the cecum as questioned previously.  However, the appendix remains prominent measuring up to 12 mm in diameter. Although once again there is no evidence of very appendiceal strandiness to indicate an acute inflammatory process, there is mucosal edema of the appendix and the somewhat rigid appearance is worrisome for possible chronic appendicitis.  Vascular/Lymphatic: Significant abdominal aortic atherosclerosis is noted. There are a few small mesenteric and retroperitoneal nodes present none of which are pathologically enlarged. Reproductive: The uterus has previously been resected. No adnexal lesion is seen. No fluid is noted within the pelvis. Other: There are scattered foci of density within soft tissues subcutaneously possibly due to subacute shots. Musculoskeletal: The lumbar vertebrae are normal alignment with relatively normal intervertebral disc spaces appear IMPRESSION: 1. Edematous gallbladder without corresponding adjacent inflammatory changes, worrisome for possible chronic appendicitis. No evidence of mucocele is seen. 2. The base of thececum appears more normal with no evidence of edema. 3. Diffuse coronary artery calcifications and abdominal aortic atherosclerosis. Electronically Signed: By: Ivar Drape M.D. On: 10/29/2017 16:21    Anti-infectives: Anti-infectives (From admission, onward)   Start     Dose/Rate Route Frequency Ordered Stop   10/27/17 0900  ciprofloxacin (CIPRO) IVPB 400 mg     400 mg 200 mL/hr over 60 Minutes Intravenous Every 12 hours 10/26/17 2222     10/27/17 0500  metroNIDAZOLE (FLAGYL) IVPB 500 mg     500 mg 100 mL/hr over 60 Minutes Intravenous Every 8 hours 10/26/17 2222     10/26/17 2030  ciprofloxacin (CIPRO) IVPB 400 mg     400 mg 200 mL/hr over 60 Minutes Intravenous  Once 10/26/17 2028 10/26/17 2247   10/26/17 2030  metroNIDAZOLE (FLAGYL) IVPB 500 mg     500 mg 100 mL/hr over 60 Minutes Intravenous  Once 10/26/17 2028 10/26/17 2145      Assessment/Plan: Patient with RLQ/ right sided abdominal pain possible colitis/ chronic appendicitis with some improvement and on a diet, but still with some pain. Agree appendix looks thickened and could harbor pathology, and agree with removal electively.  It is in the best interest of the patient to get a colonoscopy prior to any surgical procedure in order to  rule out something more extensive which would need a larger surgery / colectomy, etc.  If colonoscopy negative, will proceed with laparoscopic appendectomy as outpatient. Will need appointment with me to discuss after colonoscopy.   Will update Dr. Carles Collet. Spoke with Walden Field NP with GI.    LOS: 4 days    Virl Cagey 10/30/2017

## 2017-10-30 NOTE — Patient Outreach (Signed)
[facility]) Care Management  10/30/2017  AMANDALYNN PITZ 1951/04/10 [id number]   Care coordination  CM contacted via email by Marin Comment from [place] about this patient on 10/27/17.  CM noted pt remains hospitalized with brief checks in Epic on 10/27/17, 10/28/17 and 10/30/17.  Emails sent to [facility] Comment and [facility] liaison that patient monitored for possible hospital discharge,  found to be followed by A Samul Dada, [facility] CM and pending [facility] CM community engagement upon discharge from hospital.  Plans: Patient to be engaged for [facility] CM community services upon discharge from hospital.  [place]. Lavina Hamman, RN, BSN, [place] Care Management [phone]

## 2017-10-30 NOTE — Care Management Obs Status (Signed)
Corder NOTIFICATION   Patient Details  Name: Kristen Garner MRN: [id number] Date of Birth: 01-May-1951   Medicare Observation Status Notification Given:  Yes    Sherald Barge, RN 10/30/2017, 9:39 AM

## 2017-10-31 ENCOUNTER — Encounter (HOSPITAL_COMMUNITY)
Admission: EM | Disposition: A | Discharge status: Home / Self Care | Payer: Self-pay | Source: Home / Self Care | Attending: Internal Medicine

## 2017-10-31 ENCOUNTER — Encounter (HOSPITAL_COMMUNITY): Payer: Self-pay | Admitting: *Deleted

## 2017-10-31 DIAGNOSIS — R1310 Dysphagia, unspecified: Secondary | ICD-10-CM

## 2017-10-31 DIAGNOSIS — K3189 Other diseases of stomach and duodenum: Secondary | ICD-10-CM

## 2017-10-31 HISTORY — PX: ESOPHAGOGASTRODUODENOSCOPY: SHX5428

## 2017-10-31 LAB — GLUCOSE, CAPILLARY
GLUCOSE-CAPILLARY: 122 mg/dL — AB (ref 65–99)
GLUCOSE-CAPILLARY: 109 mg/dL — AB (ref 65–99)

## 2017-10-31 SURGERY — EGD (ESOPHAGOGASTRODUODENOSCOPY)
Anesthesia: Moderate Sedation

## 2017-10-31 MED ORDER — LIDOCAINE VISCOUS 2 % MT SOLN
OROMUCOSAL | Status: DC | PRN
  Administered 2017-10-31: 1 via OROMUCOSAL

## 2017-10-31 MED ORDER — ONDANSETRON HCL 4 MG/2ML IJ SOLN
INTRAMUSCULAR | Status: AC
  Filled 2017-10-31: qty 2

## 2017-10-31 MED ORDER — MIDAZOLAM HCL 5 MG/5ML IJ SOLN
INTRAMUSCULAR | Status: AC
  Filled 2017-10-31: qty 10

## 2017-10-31 MED ORDER — MEPERIDINE HCL 100 MG/ML IJ SOLN
INTRAMUSCULAR | Status: AC
  Filled 2017-10-31: qty 2

## 2017-10-31 MED ORDER — MEPERIDINE HCL 100 MG/ML IJ SOLN
INTRAMUSCULAR | Status: DC | PRN
  Administered 2017-10-31: 25 mg via INTRAVENOUS
  Administered 2017-10-31: 50 mg via INTRAVENOUS

## 2017-10-31 MED ORDER — SIMETHICONE 40 MG/0.6ML PO SUSP
ORAL | Status: DC | PRN
  Administered 2017-10-31: 2.5 mL

## 2017-10-31 MED ORDER — LIDOCAINE VISCOUS 2 % MT SOLN
OROMUCOSAL | Status: AC
  Filled 2017-10-31: qty 15

## 2017-10-31 MED ORDER — SODIUM CHLORIDE 0.9 % IV SOLN
INTRAVENOUS | Status: DC
  Filled 2017-10-31 (×3): qty 1000

## 2017-10-31 MED ORDER — SODIUM CHLORIDE 0.9 % IV SOLN: INTRAVENOUS | Status: DC

## 2017-10-31 MED ORDER — PANTOPRAZOLE SODIUM 40 MG PO TBEC: 40.0000 mg | DELAYED_RELEASE_TABLET | Freq: Every day | ORAL | 1 refills | Status: DC

## 2017-10-31 MED ORDER — POTASSIUM CHLORIDE IN NACL 20-0.9 MEQ/L-% IV SOLN
INTRAVENOUS | Status: DC
Start: 2017-10-31 — End: 2017-10-31
  Administered 2017-10-31: 05:00:00 via INTRAVENOUS

## 2017-10-31 MED ORDER — MIDAZOLAM HCL 5 MG/5ML IJ SOLN
INTRAMUSCULAR | Status: DC | PRN
  Administered 2017-10-31: 1 mg via INTRAVENOUS
  Administered 2017-10-31 (×2): 2 mg via INTRAVENOUS

## 2017-10-31 MED ORDER — ONDANSETRON HCL 4 MG/2ML IJ SOLN
INTRAMUSCULAR | Status: DC | PRN
  Administered 2017-10-31: 4 mg via INTRAVENOUS

## 2017-10-31 MED ORDER — SODIUM CHLORIDE 0.9 % IV SOLN
INTRAVENOUS | Status: DC | PRN
  Administered 2017-10-31: 1000 mL via INTRAVENOUS

## 2017-10-31 NOTE — Op Note (Signed)
[facility] Patient Name: Kristen Garner Procedure Date: 10/31/2017 11:18 AM MRN: [id number] Date of Birth: 1951/06/05 Attending MD: Norvel Richards , MD CSN: [id number] Age: 66 Admit Type: Inpatient Procedure:                Upper GI endoscopy Indications:              Epigastric abdominal pain, Dysphagia Providers:                Norvel Richards, MD, Lurline Del, RN, Randa Spike, Technician Referring MD:              Medicines:                Midazolam 5 mg IV, Meperidine 75 mg IV, Ondansetron                            4 mg IV Complications:            No immediate complications. Estimated Blood Loss:     Estimated blood loss was minimal. Procedure:                Pre-Anesthesia Assessment:                           - Prior to the procedure, a History and Physical                            was performed, and patient medications and                            allergies were reviewed. The patient's tolerance of                            previous anesthesia was also reviewed. The risks                            and benefits of the procedure and the sedation                            options and risks were discussed with the patient.                            All questions were answered, and informed consent                            was obtained. Prior Anticoagulants: The patient has                            taken no previous anticoagulant or antiplatelet                            agents. ASA Grade Assessment: II - A patient with  mild systemic disease. After reviewing the risks                            and benefits, the patient was deemed in                            satisfactory condition to undergo the procedure.                           After obtaining informed consent, the endoscope was                            passed under direct vision. Throughout the                            procedure,  the patient's blood pressure, pulse, and                            oxygen saturations were monitored continuously. The                            EG-[id number] ([id number]) scope was introduced through the                            mouth, and advanced to the second part of duodenum.                            The upper GI endoscopy was accomplished without                            difficulty. The patient tolerated the procedure                            well. Scope In: 12:04:55 PM Scope Out: 12:12:03 PM Total Procedure Duration: 0 hours 7 minutes 8 seconds  Findings:      The esophagus appeared normal.      Patchy erythematous and somewhat fibrotic mucosa was found in the entire       examined stomach. No ulcer or infiltrating process seen.      The duodenal bulb and second portion of the duodenum were normal. The       scope was withdrawn. Dilation was performed with a Maloney dilator with       mild resistance at 16 Fr. The dilation site was examined following       endoscope reinsertion and showed no change. Estimated blood loss was       minimal. Finally, the abnormal gastric mucosa was biopsied with a cold       forceps for histology. Estimated blood loss was minimal. Impression:               - Normal esophagus. Dilated.                           - Abnormal gastric mucosa of uncertain  significance?"status post biopsy..                           - Normal duodenal bulb and second portion of the                            duodenum. Moderate Sedation:      Moderate (conscious) sedation was administered by the endoscopy nurse       and supervised by the endoscopist. The following parameters were       monitored: oxygen saturation, heart rate, blood pressure, respiratory       rate, EKG, adequacy of pulmonary ventilation, and response to care.       Total physician intraservice time was 19 minutes. Recommendation:           - Patient has a contact number  available for                            emergencies. The signs and symptoms of potential                            delayed complications were discussed with the                            patient. Return to normal activities tomorrow.                            Written discharge instructions were provided to the                            patient.                           - Return patient to hospital ward for observation.                           - Advance diet as tolerated. Continue Protonix 40                            mg daily. From a GI standpoint, could be discharged                            anytime. I will arrange follow-up in 4 weeks to set                            up a colonoscopy. Eventual laparoscopic                            appendectomy as well.                           - Continue present medications.                           - No repeat upper endoscopy.                           -  Return to GI office in 4 weeks. Procedure Code(s):        --- Professional ---                           [phone], Esophagogastroduodenoscopy, flexible,                            transoral; with biopsy, single or multiple                           43450, Dilation of esophagus, by unguided sound or                            bougie, single or multiple passes                           99152, Moderate sedation services provided by the                            same physician or other qualified health care                            professional performing the diagnostic or                            therapeutic service that the sedation supports,                            requiring the presence of an independent trained                            observer to assist in the monitoring of the                            patient's level of consciousness and physiological                            status; initial 15 minutes of intraservice time,                            patient age 85 years or  older Diagnosis Code(s):        --- Professional ---                           K31.89, Other diseases of stomach and duodenum                           R10.13, Epigastric pain                           R13.10, Dysphagia, unspecified CPT copyright 2016 [employer]. All rights reserved. The codes documented in this report are preliminary and upon coder review may  be revised to meet current compliance requirements. Cristopher Estimable. Bowen Goyal, MD Norvel Richards, MD 10/31/2017 12:28:00 PM This report has been signed electronically. Number  of Addenda: 0

## 2017-10-31 NOTE — Discharge Summary (Signed)
Physician Discharge Summary  Kristen Garner IDH:[id number] DOB: Apr 15, 1951 DOA: 10/26/2017  PCP: Sharilyn Sites, MD  Admit date: 10/26/2017 Discharge date: 10/31/2017  Admitted From: Home Disposition:  Home  Recommendations for Outpatient Follow-up:  1. Follow up with PCP in 1-2 weeks 2. Please obtain BMP/CBC in one week   Discharge Condition: Stable CODE STATUS:FULL Diet recommendation: Heart Healthy   Brief/Interim Summary: 66 year old female with a history of systolic and diastolic CHF, CKD stage III, right-sided breast cancer, coronary artery disease, diabetes mellitus, hypertension, hyperlipidemia, anxiety/depression presented with epigastric abdominal pain 4 days prior to admission with intractable nausea and decreased oral intake. She denied any fever, chills, vomiting, diarrhea, hematochezia, melena, dysuria, hematuria. She denies any new medications. CT of the abdomen and pelvis at the time of admission revealed cecal wall thickening with associated enlargement and thickening of the appendix without periappendiceal inflammatory changes. General surgery was consulted. He did not feel the patient had appendicitis. The patient has remained afebrile hemodynamically stable without leukocytosis. She was started on Cipro and Flagyl. Unfortunately, the patient continues to have epigastric discomfort and intractable nausea. Her diet has not been able to be advanced.Subsequently, GI was consulted to assist with management. Repeat CT of the abdomen and pelvis was performed on October 29, 2017 due to the patient's persistent abdominal pain.    Discharge Diagnoses:  Chronic appendicitis -Continue ciprofloxacin and Flagyl initially-->stopped on 12/7 and pt remained clinically stable -Appreciate general surgery consult--no acute surgical intervention needed, but will need elective appendectomy. Doesnot feel patient hasacuteappendicitis -Gen surgery recommends colonoscopy  prior to appy all of which can be done as outpt -discussed with GI whom agrees with out pt colonoscopy -Remains afebrile hemodynamically stable -overall abd pain continued to improve  Intractable abdominal pain and nausea/dysphagia -Started Protonix twice daily-->still nauseous -GI consultappreciated-->repeat CT abd/pelvis -12/6 repeat CT abd--edematous appearing appendix without corresponding adjacent inflammatory changes worrisome for possible chronic appendicitis. No evidence of mucocele is seen.base of cecum appears more normal with no evidence of edema -hesitate to start reglan due to prolonged QTc with amiodarone -continue scheduled zofran -case discussed with Dr. Lenetta Quaker for EGD 12/8 -10/31/17 EGD--normal esophagus, dilated.  Abnormal gastric mucosa of uncertain significance biopsied.  Normal duodenal bulb and second portion of duodenum. -Even prior to the EGD, the patient's diet was gradually advanced; although the patient was only able to eat small amounts, she was able to tolerate. -12/8--Case was discussed with Dr. Gala Romney whom felt the patient could be discharged from a GI standpoint.  Chronic systolic and diastolic CHF -September 27, 2017 echo EF 20-25%, grade 2 DD, diffuse HK, mild TR, moderate MR -Appears clinically euvolemic -Holding Lasix temporarily-->restart after discharge -Daily weights--stable 168-170  Atrial tachycardia -Continue amiodarone, metoprolol succinate -no episodes this admission  Acute on chronic renal failure--CKD stage III -Baseline creatinine 1.2-1.5 -Serum creatinine peaked at 2.04 -Monitor BMP -serum creatinine 1.68 at time of d/c  Coronary artery disease -No chest pain presently -status post DES x2 to the RCA in November 2017 -Continue aspirin and Plavix  Hyperlipidemia -Continue statin  Diabetes mellitus type 2 -Continue NovoLog sliding scale -Hemoglobin A1c--6.8 -CBGs controlled      Discharge  Instructions  Discharge Instructions    Diet - low sodium heart healthy   Complete by:  As directed    Increase activity slowly   Complete by:  As directed      Allergies as of 10/31/2017      Reactions   Adhesive [tape] Rash   Codeine  Itching   Penicillins Other (See Comments)   Child hood allergy Has patient had a PCN reaction causing immediate rash, facial/tongue/throat swelling, SOB or lightheadedness with hypotension: Unknown Has patient had a PCN reaction causing severe rash involving mucus membranes or skin necrosis: Unknown Has patient had a PCN reaction that required hospitalization: No Has patient had a PCN reaction occurring within the last 10 years: No If all of the above answers are "NO", then may proceed with Cephalosporin use.   Sulfonamide Derivatives Itching   Iodine    Patient states that she got a cat scratch and her mom put iodine on it and then got catch scratch fever      Medication List    TAKE these medications   albuterol 108 (90 Base) MCG/ACT inhaler Commonly known as:  PROVENTIL HFA;VENTOLIN HFA Inhale 2 puffs into the lungs every 4 (four) hours as needed for wheezing or shortness of breath.   ALPRAZolam 1 MG tablet Commonly known as:  XANAX Take 1 mg by mouth 2 (two) times daily.   amiodarone 200 MG tablet Commonly known as:  PACERONE Take 1 tablet (200 mg total) 2 (two) times daily by mouth.   ANTACID 420 MG Chew chewable tablet Generic drug:  calcium carbonate Chew 420 mg daily as needed by mouth for indigestion or heartburn.   aspirin 81 MG EC tablet Take 1 tablet (81 mg total) by mouth daily.   clopidogrel 75 MG tablet Commonly known as:  PLAVIX Take 1 tablet (75 mg total) daily by mouth.   escitalopram 10 MG tablet Commonly known as:  LEXAPRO Take 10 mg daily by mouth.   furosemide 40 MG tablet Commonly known as:  LASIX Take 1 tablet (40 mg total) daily by mouth.   HYDROcodone-acetaminophen 5-325 MG tablet Commonly known as:   NORCO/VICODIN Take one tab po q 6 hrs prn pain What changed:    how much to take  how to take this  when to take this  reasons to take this  additional instructions   methocarbamol 500 MG tablet Commonly known as:  ROBAXIN Take 1 tablet (500 mg total) by mouth 3 (three) times daily.   metoprolol succinate 100 MG 24 hr tablet Commonly known as:  TOPROL-XL Take 1 tablet (100 mg total) 2 (two) times daily by mouth. Take with or immediately following a meal.   mometasone 50 MCG/ACT nasal spray Commonly known as:  NASONEX Place 2 sprays into the nose daily.   nitroGLYCERIN 0.4 MG SL tablet Commonly known as:  NITROSTAT Place 0.4 mg under the tongue every 5 (five) minutes as needed for chest pain.   pantoprazole 40 MG tablet Commonly known as:  PROTONIX Take 1 tablet (40 mg total) by mouth daily.   Potassium Chloride ER 20 MEQ Tbcr Take 20 mEq by mouth daily. What changed:  when to take this   rOPINIRole 1 MG tablet Commonly known as:  REQUIP Take 1 mg at bedtime by mouth.   rosuvastatin 5 MG tablet Commonly known as:  CRESTOR Take 1 tablet (5 mg total) daily by mouth.   zolpidem 10 MG tablet Commonly known as:  AMBIEN Take 10 mg by mouth at bedtime.       Allergies  Allergen Reactions  . Adhesive [Tape] Rash  . Codeine Itching  . Penicillins Other (See Comments)    Child hood allergy Has patient had a PCN reaction causing immediate rash, facial/tongue/throat swelling, SOB or lightheadedness with hypotension: Unknown Has patient had a  PCN reaction causing severe rash involving mucus membranes or skin necrosis: Unknown Has patient had a PCN reaction that required hospitalization: No Has patient had a PCN reaction occurring within the last 10 years: No If all of the above answers are "NO", then may proceed with Cephalosporin use.   . Sulfonamide Derivatives Itching  . Iodine     Patient states that she got a cat scratch and her mom put iodine on it and then  got catch scratch fever    Consultations: General surgery GI--[facility]   Procedures/Studies: Ct Abdomen Pelvis Wo Contrast  Result Date: 10/26/2017 CLINICAL DATA:  Upper abdominal pain for 5 days, no bowel movement for 7 days, high grade bowel obstruction, history breast cancer, coronary artery disease post PTCA, chronic systolic CHF, diabetes mellitus, hypertension, former smoker EXAM: CT ABDOMEN AND PELVIS WITHOUT CONTRAST TECHNIQUE: Multidetector CT imaging of the abdomen and pelvis was performed following the standard protocol without IV contrast. Sagittal and coronal MPR images reconstructed from axial data set. Patient drank dilute oral contrast for exam COMPARISON:  None FINDINGS: Lower chest: Lung bases clear Hepatobiliary: Gallbladder surgically absent.  Liver unremarkable. Pancreas: Normal appearance Spleen: Normal appearance Adrenals/Urinary Tract: Adrenal glands, kidneys, ureters and bladder normal appearance Stomach/Bowel: Bowel wall thickening of cecum. Enlarged appendix with thickened wall, overall measuring up to 13 mm diameter. No definite peri appendiceal inflammatory changes. No evidence of perforation or abscess. Ileocecal valve is patent without small bowel dilatation or obstruction. Remaining large and small bowel loops unremarkable. Stomach normal appearance. Vascular/Lymphatic: Scattered atherosclerotic calcifications aorta, iliac arteries and visceral branch vessels. Enlarged peripancreatic lymph node 14 mm short axis image 23. 11 mm portal caval node image 25. Normal and upper normal sized RIGHT cardiophrenic angle lymph nodes. Additional scattered retroperitoneal nodes without additional mobile enlargement. Reproductive: Uterus surgically absent. Nonvisualization of ovaries. Other: No free air free fluid. Tiny umbilical hernia containing fat. Musculoskeletal: Bones demineralized. IMPRESSION: Wall thickening of the cecum with associated enlargement and thickening of the appendix  though no definite periappendiceal infiltrative changes are identified. This could represent either very early appendicitis or colitis/typhlitis with secondary thickening of the appendix. Coexistent cecal wall thickening makes a mucocele of the appendix unlikely. Nonspecific minimally enlarged peripancreatic and portacaval lymph nodes. Electronically Signed   By: Lavonia Dana M.D.   On: 10/26/2017 19:50   Dg Chest 2 View  Result Date: 10/04/2017 CLINICAL DATA:  66 y/o F; chest pain. History of breast cancer, bronchitis, tachycardia. EXAM: CHEST  2 VIEW COMPARISON:  09/25/2017 chest radiograph FINDINGS: Stable cardiomegaly. Aortic atherosclerosis with calcification. Diffuse increased interstitial opacities, bibasilar consolidations, and small bilateral effusions. No acute osseous abnormality is evident. IMPRESSION: Increased pulmonary edema and small bilateral pleural effusions. Bibasilar opacities probably represent associated atelectasis. Electronically Signed   By: Kristine Garbe M.D.   On: 10/04/2017 18:30   Ct Abdomen Pelvis W Contrast  Addendum Date: 10/29/2017   ADDENDUM REPORT: 10/29/2017 17:07 ADDENDUM: Correction to the IMPRESSION: The first impression should read " edematous appearing appendix without corresponding adjacent inflammatory changes worrisome for possible chronic appendicitis. No evidence of mucocele is seen. " Electronically Signed   By: Ivar Drape M.D.   On: 10/29/2017 17:07   Result Date: 10/29/2017 CLINICAL DATA:  Right lower quadrant and epigastric tenderness over the last week, history of breast carcinoma EXAM: CT ABDOMEN AND PELVIS WITH CONTRAST TECHNIQUE: Multidetector CT imaging of the abdomen and pelvis was performed using the standard protocol following bolus administration of intravenous contrast. CONTRAST:  57mL ISOVUE-300 IOPAMIDOL (ISOVUE-300) INJECTION 61%, 49mL ISOVUE-300 IOPAMIDOL (ISOVUE-300) INJECTION 61% COMPARISON:  CT abdomen pelvis of 10/26/2017  FINDINGS: Lower chest: The lung bases are clear. Cardiomegaly is stable. No pericardial effusion is seen. Diffuse coronary artery calcifications are noted. Hepatobiliary: The liver enhances with no focal abnormality and no ductal dilatation is seen. The patient appears to undergone prior cholecystectomy. Pancreas: The pancreas is normal in size in the pancreatic duct is not dilated. Spleen: The spleen is unremarkable. Adrenals/Urinary Tract: The adrenal glands appear normal. The kidneys enhance with no calculus or mass. On delayed images the pelvocaliceal systems are unremarkable. The ureters appear normal in caliber. Urinary bladder is not well distended but no abnormality is seen. Stomach/Bowel: The stomach is slightly distended with oral contrast. No small bowel distention is noted. No definite edema is currently seen involving the base of the cecum as questioned previously. However, the appendix remains prominent measuring up to 12 mm in diameter. Although once again there is no evidence of very appendiceal strandiness to indicate an acute inflammatory process, there is mucosal edema of the appendix and the somewhat rigid appearance is worrisome for possible chronic appendicitis. Vascular/Lymphatic: Significant abdominal aortic atherosclerosis is noted. There are a few small mesenteric and retroperitoneal nodes present none of which are pathologically enlarged. Reproductive: The uterus has previously been resected. No adnexal lesion is seen. No fluid is noted within the pelvis. Other: There are scattered foci of density within soft tissues subcutaneously possibly due to subacute shots. Musculoskeletal: The lumbar vertebrae are normal alignment with relatively normal intervertebral disc spaces appear IMPRESSION: 1. Edematous gallbladder without corresponding adjacent inflammatory changes, worrisome for possible chronic appendicitis. No evidence of mucocele is seen. 2. The base of thececum appears more normal with  no evidence of edema. 3. Diffuse coronary artery calcifications and abdominal aortic atherosclerosis. Electronically Signed: By: Ivar Drape M.D. On: 10/29/2017 16:21   Dg Abd 2 Views  Result Date: 10/04/2017 CLINICAL DATA:  Acute onset of shortness of breath and upper abdominal pain. Constipation. EXAM: ABDOMEN - 2 VIEW COMPARISON:  Abdominal radiograph performed 01/23/2017 FINDINGS: The visualized bowel gas pattern is unremarkable. Scattered air and stool filled loops of colon are seen; no abnormal dilatation of small bowel loops is seen to suggest small bowel obstruction. No free intra-abdominal air is identified on the provided upright view. The visualized osseous structures are within normal limits; the sacroiliac joints are unremarkable in appearance. Small bilateral pleural effusions are noted. Associated bibasilar opacities may reflect atelectasis or pneumonia. IMPRESSION: 1. Unremarkable bowel gas pattern; no free intra-abdominal air seen. Small to moderate amount of stool noted in the colon. 2. Small bilateral pleural effusions, with associated bibasilar opacities possibly reflecting atelectasis or pneumonia. Electronically Signed   By: Garald Balding M.D.   On: 10/04/2017 22:50        Discharge Exam: Vitals:   10/31/17 1240 10/31/17 1508  BP: (!) 120/59 (!) 170/61  Pulse: 60 (!) 57  Resp: 14 13  Temp:  98.4 F (36.9 C)  SpO2: 100% 100%   Vitals:   10/31/17 1230 10/31/17 1235 10/31/17 1240 10/31/17 1508  BP: (!) 127/59 (!) 118/53 (!) 120/59 (!) 170/61  Pulse: 60 62 60 (!) 57  Resp: 11 14 14 13   Temp:    98.4 F (36.9 C)  TempSrc:    Oral  SpO2: 100% 100% 100% 100%  Weight:      Height:        General: Pt is alert, awake, not  in acute distress Cardiovascular: RRR, S1/S2 +, no rubs, no gallops Respiratory: CTA bilaterally, no wheezing, no rhonchi Abdominal: Soft, NT, ND, bowel sounds + Extremities: no edema, no cyanosis   The results of significant diagnostics from  this hospitalization (including imaging, microbiology, ancillary and laboratory) are listed below for reference.    Significant Diagnostic Studies: Ct Abdomen Pelvis Wo Contrast  Result Date: 10/26/2017 CLINICAL DATA:  Upper abdominal pain for 5 days, no bowel movement for 7 days, high grade bowel obstruction, history breast cancer, coronary artery disease post PTCA, chronic systolic CHF, diabetes mellitus, hypertension, former smoker EXAM: CT ABDOMEN AND PELVIS WITHOUT CONTRAST TECHNIQUE: Multidetector CT imaging of the abdomen and pelvis was performed following the standard protocol without IV contrast. Sagittal and coronal MPR images reconstructed from axial data set. Patient drank dilute oral contrast for exam COMPARISON:  None FINDINGS: Lower chest: Lung bases clear Hepatobiliary: Gallbladder surgically absent.  Liver unremarkable. Pancreas: Normal appearance Spleen: Normal appearance Adrenals/Urinary Tract: Adrenal glands, kidneys, ureters and bladder normal appearance Stomach/Bowel: Bowel wall thickening of cecum. Enlarged appendix with thickened wall, overall measuring up to 13 mm diameter. No definite peri appendiceal inflammatory changes. No evidence of perforation or abscess. Ileocecal valve is patent without small bowel dilatation or obstruction. Remaining large and small bowel loops unremarkable. Stomach normal appearance. Vascular/Lymphatic: Scattered atherosclerotic calcifications aorta, iliac arteries and visceral branch vessels. Enlarged peripancreatic lymph node 14 mm short axis image 23. 11 mm portal caval node image 25. Normal and upper normal sized RIGHT cardiophrenic angle lymph nodes. Additional scattered retroperitoneal nodes without additional mobile enlargement. Reproductive: Uterus surgically absent. Nonvisualization of ovaries. Other: No free air free fluid. Tiny umbilical hernia containing fat. Musculoskeletal: Bones demineralized. IMPRESSION: Wall thickening of the cecum with  associated enlargement and thickening of the appendix though no definite periappendiceal infiltrative changes are identified. This could represent either very early appendicitis or colitis/typhlitis with secondary thickening of the appendix. Coexistent cecal wall thickening makes a mucocele of the appendix unlikely. Nonspecific minimally enlarged peripancreatic and portacaval lymph nodes. Electronically Signed   By: Lavonia Dana M.D.   On: 10/26/2017 19:50   Dg Chest 2 View  Result Date: 10/04/2017 CLINICAL DATA:  66 y/o F; chest pain. History of breast cancer, bronchitis, tachycardia. EXAM: CHEST  2 VIEW COMPARISON:  09/25/2017 chest radiograph FINDINGS: Stable cardiomegaly. Aortic atherosclerosis with calcification. Diffuse increased interstitial opacities, bibasilar consolidations, and small bilateral effusions. No acute osseous abnormality is evident. IMPRESSION: Increased pulmonary edema and small bilateral pleural effusions. Bibasilar opacities probably represent associated atelectasis. Electronically Signed   By: Kristine Garbe M.D.   On: 10/04/2017 18:30   Ct Abdomen Pelvis W Contrast  Addendum Date: 10/29/2017   ADDENDUM REPORT: 10/29/2017 17:07 ADDENDUM: Correction to the IMPRESSION: The first impression should read " edematous appearing appendix without corresponding adjacent inflammatory changes worrisome for possible chronic appendicitis. No evidence of mucocele is seen. " Electronically Signed   By: Ivar Drape M.D.   On: 10/29/2017 17:07   Result Date: 10/29/2017 CLINICAL DATA:  Right lower quadrant and epigastric tenderness over the last week, history of breast carcinoma EXAM: CT ABDOMEN AND PELVIS WITH CONTRAST TECHNIQUE: Multidetector CT imaging of the abdomen and pelvis was performed using the standard protocol following bolus administration of intravenous contrast. CONTRAST:  48mL ISOVUE-300 IOPAMIDOL (ISOVUE-300) INJECTION 61%, 10mL ISOVUE-300 IOPAMIDOL (ISOVUE-300) INJECTION  61% COMPARISON:  CT abdomen pelvis of 10/26/2017 FINDINGS: Lower chest: The lung bases are clear. Cardiomegaly is stable. No pericardial effusion is seen.  Diffuse coronary artery calcifications are noted. Hepatobiliary: The liver enhances with no focal abnormality and no ductal dilatation is seen. The patient appears to undergone prior cholecystectomy. Pancreas: The pancreas is normal in size in the pancreatic duct is not dilated. Spleen: The spleen is unremarkable. Adrenals/Urinary Tract: The adrenal glands appear normal. The kidneys enhance with no calculus or mass. On delayed images the pelvocaliceal systems are unremarkable. The ureters appear normal in caliber. Urinary bladder is not well distended but no abnormality is seen. Stomach/Bowel: The stomach is slightly distended with oral contrast. No small bowel distention is noted. No definite edema is currently seen involving the base of the cecum as questioned previously. However, the appendix remains prominent measuring up to 12 mm in diameter. Although once again there is no evidence of very appendiceal strandiness to indicate an acute inflammatory process, there is mucosal edema of the appendix and the somewhat rigid appearance is worrisome for possible chronic appendicitis. Vascular/Lymphatic: Significant abdominal aortic atherosclerosis is noted. There are a few small mesenteric and retroperitoneal nodes present none of which are pathologically enlarged. Reproductive: The uterus has previously been resected. No adnexal lesion is seen. No fluid is noted within the pelvis. Other: There are scattered foci of density within soft tissues subcutaneously possibly due to subacute shots. Musculoskeletal: The lumbar vertebrae are normal alignment with relatively normal intervertebral disc spaces appear IMPRESSION: 1. Edematous gallbladder without corresponding adjacent inflammatory changes, worrisome for possible chronic appendicitis. No evidence of mucocele is seen.  2. The base of thececum appears more normal with no evidence of edema. 3. Diffuse coronary artery calcifications and abdominal aortic atherosclerosis. Electronically Signed: By: Ivar Drape M.D. On: 10/29/2017 16:21   Dg Abd 2 Views  Result Date: 10/04/2017 CLINICAL DATA:  Acute onset of shortness of breath and upper abdominal pain. Constipation. EXAM: ABDOMEN - 2 VIEW COMPARISON:  Abdominal radiograph performed 01/23/2017 FINDINGS: The visualized bowel gas pattern is unremarkable. Scattered air and stool filled loops of colon are seen; no abnormal dilatation of small bowel loops is seen to suggest small bowel obstruction. No free intra-abdominal air is identified on the provided upright view. The visualized osseous structures are within normal limits; the sacroiliac joints are unremarkable in appearance. Small bilateral pleural effusions are noted. Associated bibasilar opacities may reflect atelectasis or pneumonia. IMPRESSION: 1. Unremarkable bowel gas pattern; no free intra-abdominal air seen. Small to moderate amount of stool noted in the colon. 2. Small bilateral pleural effusions, with associated bibasilar opacities possibly reflecting atelectasis or pneumonia. Electronically Signed   By: Garald Balding M.D.   On: 10/04/2017 22:50     Microbiology: No results found for this or any previous visit (from the past 240 hour(s)).   Labs: Basic Metabolic Panel: Recent Labs  Lab 10/26/17 1407 10/27/17 0327 10/28/17 0421 10/29/17 0444 10/30/17 0450  NA 135 135 134* 133* 131*  K 4.2 3.7 3.5 3.5 3.4*  CL 91* 97* 98* 96* 95*  CO2 31 26 24 27 27   GLUCOSE 189* 135* 135* 107* 120*  BUN 54* 51* 46* 33* 27*  CREATININE 2.04* 1.85* 1.72* 1.69* 1.68*  CALCIUM 9.8 9.0 9.0 8.9 8.9  MG  --   --   --  1.9  --    Liver Function Tests: Recent Labs  Lab 10/26/17 1407 10/27/17 0327  AST 26 19  ALT 24 19  ALKPHOS 141* 110  BILITOT 0.7 0.6  PROT 8.7* 7.2  ALBUMIN 5.1* 4.2   Recent Labs  Lab  10/26/17 1407  LIPASE 28   No results for input(s): AMMONIA in the last 168 hours. CBC: Recent Labs  Lab 10/26/17 1407 10/27/17 0327 10/28/17 0421 10/30/17 0450  WBC 9.5 7.6 6.1 8.4  NEUTROABS  --  6.0  --   --   HGB 14.9 13.2 12.8 12.6  HCT 46.7* 40.7 39.3 38.9  MCV 90.5 90.6 90.3 89.2  PLT 325 224 221 299   Cardiac Enzymes: No results for input(s): CKTOTAL, CKMB, CKMBINDEX, TROPONINI in the last 168 hours. BNP: Invalid input(s): POCBNP CBG: Recent Labs  Lab 10/30/17 1136 10/30/17 1613 10/30/17 2207 10/31/17 0805 10/31/17 1133  GLUCAP 158* 119* 112* 122* 109*    Time coordinating discharge:  Greater than 30 minutes  Signed:  Orson Eva, DO [facility] Pager: [phone] 10/31/2017, 3:10 PM

## 2017-10-31 NOTE — Progress Notes (Signed)
Patient seen and examined this morning. Patient has vague esophageal dysphagia symptoms.  Complains of only mild mid-epigastric pain this morning. She has mild epigastric tenderness to palpation.  I have offered this nice lady and EGD with possible esophageal dilation /appropriate this morning.The risks, benefits, limitations, alternatives and imponderables have been reviewed with the patient. Potential for esophageal dilation, biopsy, etc. have also been reviewed.  Questions have been answered. Patient is agreeable.

## 2017-11-02 ENCOUNTER — Other Ambulatory Visit: Payer: Self-pay | Admitting: *Deleted

## 2017-11-02 NOTE — Patient Outreach (Signed)
[facility]) Care Management  11/02/2017  KATHEREN JIMMERSON 09-14-1951 [id number]  Transition of Care Outreach:   Mrs.Tanganika D McCollumis a 66 y.o.femalewith history of anxiety, depression, right breast cancer, CAD/stent placement x3 in November 0086, chronic systolic heart failure, stage III chronic kidney disease, hyperlipidemia, hypertension, osteopenia, type 2 diabetes, paroxysmal atrial tachycardia, PVCs, herpes zoster, and right lower extremity of skin cancer.   Mrs. Cirrincione was referred to [place] [facility] for transition of care services after her previous hospitalization from 09/25/17-10/01/17 then readmission from 10/04/17-10/07/17 for atrial tachycardia and acute/chronic combined systolic and diastolic CHF. I have been able to reach Mrs. Welford only once by phone and had been unable to maintain contact with her.   Mrs. Hinch was readmitted to the hospital again from 10/26/17-10/31/17 when she presented with GI symptoms. She was treated for chronic appendicitis for which surgery recommended no acute surgical intervention but recommended elective appendectomy and colonoscopy prior to procedure, and intractable abdominal pain and nausea/dysphagia for which GI ordered a repeat CT of abdomen and pelvis with following recommendation for endoscopy which showed a normal but dilated esophagus. In addition, Mrs. Guisinger's chronic systolic and diastolic CHF (daily weights stable between 168-170), atrial tachycardia, and acute on chronic renal failure for which her BMP was monitored closely.   I reached out to Mrs. Casino today to perform a transition of care assessment but was unable to reach her by phone or leave a voice message as her voice mail has not been set up.   Plan: I have notified Dr. Delanna Ahmadi office via the patient care coordination Doreene Eland of my outreach attempts to reach Mrs. Auker post hospitalization and our difficulty in general maintaining  contact with her. In light of her 3 hospitalizations over the last 40 days, we are hopeful that collaboration between the primary care provider office and our team in the community will result in Mrs. Christofferson's engagement with [place] so that we can provide case management and care coordination assistance if she wishes to engage. I have also reviewed notes from our telephonic case management team who also had difficulty with maintaining contact with Mrs. Bettenhausen prior to the onset of her hospitalizations.   I will reach out to Mrs. Jawad again tomorrow.    [place] Management  [phone]

## 2017-11-03 ENCOUNTER — Other Ambulatory Visit: Payer: Self-pay | Admitting: *Deleted

## 2017-11-03 ENCOUNTER — Encounter (HOSPITAL_COMMUNITY): Payer: Self-pay | Admitting: Internal Medicine

## 2017-11-03 NOTE — Patient Outreach (Signed)
[facility]) Care Management  11/03/2017  Kristen Garner 1951-06-04 [id number]  Kristen Sprecher McCollumis a 66 y.o.femalewithhistory ofanxiety, depression, right breast cancer, CAD/stent placement x3 in November 0865, chronic systolic heart failure, stage III chronic kidney disease, hyperlipidemia, hypertension, osteopenia, type 2 diabetes, paroxysmal atrial tachycardia, PVCs, herpes zoster,andright lower extremity of skin cancer.   Kristen Garner was referred to [place] [facility] for transition of care services after her previous hospitalization from 09/25/17-10/01/17 then readmission from 10/04/17-10/07/17 for atrial tachycardia and acute/chronic combined systolic and diastolic CHF. Kristen Garner was readmitted to the hospital again from 10/26/17-10/31/17 when she presented with GI symptoms and was treated for colitis. I have had difficulty maintaining contact with Kristen Garner but was finally able to reach her today. Kristen Garner is in desperate need of assistance with community resources, medication management and patient assistance, and chronic disease management education and care coordination.   Medication Management - Kristen Garner does not have all her prescribed medications. She said she gets paid on Wednesday and knows she has prescriptions at the drug store but didn't know what was there. She knows she is not taking everything that is prescribed to her and says she regularly has difficulty paying for her prescriptions and is typically not able to take them on a routine basis as prescribed.   I reached out to [name] in  with Kristen Garner's permission and on her behalf. The pharmacist indicated that Methocarbamol ($17.80 out of pocket cost to patient) and Pantoprazole ($10 out of pocket cost to patient) waiting.   I have referred Kristen Garner to our pharmacy team for assistance with medication management, pharmacy patient assistance resources, and  possible application for low income subsidy or medicaid.   Chronic Disease Management  CHF - Kristen Garner has limited knowledge about management of her CHF. She does not own a scale and does not weigh herself daily. She does not have a good understanding of signs and symptoms of CHF nor when to report symptoms. Kristen Garner last saw Kristen Garner @ [facility] in [place] on 10/12/17 and does not have a scheduled upcoming cardiology appointment.   I reached out to the cardiology office to inquire about the provider's desired follow up schedule for Kristen Garner and will reach out to Kristen Garner after I hear from the office regarding scheduling and follow up.   HTN - Kristen Garner doesn't know what her blood pressure is and is "pretty sure" she is not taking her blood pressure medications. She says she often cannot afford them. She does not have a blood pressure monitor and does not check her bp at home. She has limited knowledge of signs and symptoms of hypertension and does not know when to call her provider for symptoms.   DM - Kristen Garner does not currently check her cbg's. She says she has a monitor but that it is old and does not have a battery. She also does not have monitoring supplies. She does have a new patient appointment with Kristen Garner on 11/26/16 @ 2:30pm. The last noted HgA1C on 10/29/17 was 6.8 (compared with 8.5 from 1 year ago). Kristen Garner has a very limited knowledge of her prescribed plan of care or prescribed carb modified diet.   I reached out to the patient care coordinator at the office of Kristen Garner to request that a prescription for a new glucose monitor and supplies be faxed to [facility] in [place].    Community Resource Needs -  [place]. [name] is widowed. She has a mortgage and is quite behind on bills and is concerned today that I may not be able to reach her tomorrow  because she is behind on her phone bill and fears it may be turned off by tomorrow. She frequently has concerns about maintaining the utilities and is often worried that her lights or heat may be turned off.   [name] has never applied for Medicaid. I performed an abbreviated financial assessment today and gathered basic information which I pass along to my social work Social worker when requesting consultation and assistance for [name].   Plan: I will collaborate with the community pharmacy, primary care provider, cardiology provider, endocrinology provider, [place] social worker, and [place] pharmacy team to begin a collaborative plan to assist [name] in developing a long term chronic disease management plan of care.   I will follow up with [name] by phone over the next 48 hours regarding the various consults I have placed and will schedule a home visit with her if she will allow.   [facility] CM Care Plan Problem One     Most Recent Value  Care Plan Problem One  Medication Management Concerns and Medication Assistance Needs as evidenced by patient report of financial barriers and knowledge deficits about medication management  Role Documenting the Problem One  Care Management [place] for Problem One  Active  THN Long Term Goal   Over the next 60 days, patient will verbalize understanding of long term plan for medication adherence  [facility]N Long Term Goal Start Date  11/04/17  Interventions for Problem One Long Term Goal  Medications reviewed,  pharmacy referral made,  collaboration with community pharmacy  [facility] CM Short Term Goal #1   Over the next 30 days, patient will work with [name] pharmacy team to address financial barriers related to medication management  [facility] CM Short Term Goal #1 Start Date  11/04/17  Interventions for Short Term Goal #1  [facility] CM Pharmacy team referral,  notified patient of [place] team resource and plans for engagement  [facility]  CM Short Term Goal #2   Over the next 30 days, patient will verbalize understanding of plan for obtaining currently needed medications  [facility] CM Short Term Goal #2 Start Date  11/04/17  Interventions for Short Term Goal #2  collaboration with community pharmacy    [facility] CM Care Plan Problem Two     Most Recent Value  Care Plan Problem Two  Knowledge Deficits related to Independent Self-Health Management of Chronic Disease States  Role Documenting the Problem Two  Care Management Coordinator  Care Plan for Problem Two  Active  Interventions for Problem Two Long Term Goal   reviewed with patient the need to formulate a patient centered plan of care for long term independent self health management  THN Long Term Goal  Over the next 60 days, patient will demonstrate understanding of self health management interventions to promote independent self health management and adherence to prescribed plan of care  [facility] Long Term Goal Start Date  11/04/17  [facility] CM Short Term Goal #1   Over the next 14 days, patient will meet with RNCM in person to begin development of plan for independent self-health management plan of care  [facility] CM Short Term Goal #1 Start Date  11/04/17  Interventions for  Short Term Goal #2   scheduled follow up visit with patient    [facility] CM Care Plan Problem Three     Most Recent Value  Care Plan Problem Three  Community Resource Needs related to Financial Constraints  Role Documenting the Problem Three  Care Management [place] for Problem Three  Active  THN Long Term Goal   Over the next 31 days, patient will engage and work with [place] Work team to [employer] Long Term Goal Start Date  11/04/17  Interventions for Problem Three Long Term Goal  referral made to [place] Work team      Janalyn Shy [place] Care Management  [phone]

## 2017-11-04 ENCOUNTER — Encounter: Payer: Self-pay | Admitting: Nurse Practitioner

## 2017-11-04 ENCOUNTER — Other Ambulatory Visit: Payer: Self-pay | Admitting: *Deleted

## 2017-11-04 NOTE — Patient Outreach (Signed)
[place] Garner [facility]) Care Management  11/04/2017  [name] September 23, 1951 [id number]  I reached out to Kristen Garner this afternoon to see if she was able to get to the drug store to get her prescriptions and to notify her that I have referred her to our pharmacy and social work teams but was unable to reach her by phone or leave a message.   Plan: I will collaborate with my social work and pharmacy colleagues re: Kristen Garner's case management needs and will reach out to her again no later than Monday.    [place] Management  [phone]

## 2017-11-05 ENCOUNTER — Ambulatory Visit: Payer: Self-pay | Admitting: *Deleted

## 2017-11-06 ENCOUNTER — Encounter: Payer: Self-pay | Admitting: Internal Medicine

## 2017-11-09 ENCOUNTER — Other Ambulatory Visit: Payer: Self-pay | Admitting: *Deleted

## 2017-11-09 NOTE — Patient Outreach (Signed)
[facility]) Care Management  11/09/2017  Kristen Garner 03-24-1951 [id number]  I was unable to reach [name] by phone when I called to speak with her today about transition of care, medication management, and community resource needs. I left a HIPPA compliant voice message requesting a return call.l   Plan: I will try to reach [name] by phone again next week.    [place] Management  [phone]

## 2017-11-11 ENCOUNTER — Other Ambulatory Visit: Payer: Self-pay | Admitting: Pharmacist

## 2017-11-11 NOTE — Patient Outreach (Signed)
[facility]) Care Management  11/11/2017  Kristen Garner Nov 06, 1951 [id number]  Patient was referred to [facility] by [place] for medication patient assistance evaluation.  Noted per review of chart, patient was also recently discharged from [facility].   Unsuccessful phone outreach to patient.  HIPAA compliant message left requesting return call.    Plan:  Will make second phone outreach attempt in the next week.    Karrie Meres, PharmD, [place] [phone]

## 2017-11-12 ENCOUNTER — Other Ambulatory Visit: Payer: Self-pay | Admitting: *Deleted

## 2017-11-12 NOTE — Patient Outreach (Signed)
[facility]) Care Management  11/12/2017  Kristen Garner 1951-10-30 [id number]  I was unable to reach [name] today by phone and left a HIPPA compliant voice message requesting a return call. I was able to speak with my social work partner regarding my last conversation with [name] so that we could collaborate on [name]'s needs.   Plan: I will continue to try to reach [name] by phone.    [place] Management  [phone]

## 2017-11-13 ENCOUNTER — Other Ambulatory Visit: Payer: Self-pay | Admitting: Pharmacist

## 2017-11-13 NOTE — Patient Outreach (Signed)
[place] [facility]) Care Management  11/13/2017  DARLETH EUSTACHE January 10, 1951 [id number]  Second unsuccessful phone outreach to patient.  HIPAA compliant message left requesting return call.   Spoke with [place] regarding difficulty reaching patient.    Plan:  Third phone outreach attempt next week.   Karrie Meres, PharmD, [place] [phone]

## 2017-11-18 ENCOUNTER — Other Ambulatory Visit: Payer: Self-pay | Admitting: Pharmacist

## 2017-11-18 DIAGNOSIS — Z6829 Body mass index (BMI) 29.0-29.9, adult: Secondary | ICD-10-CM | POA: Diagnosis not present

## 2017-11-18 DIAGNOSIS — E663 Overweight: Secondary | ICD-10-CM | POA: Diagnosis not present

## 2017-11-18 DIAGNOSIS — G894 Chronic pain syndrome: Secondary | ICD-10-CM | POA: Diagnosis not present

## 2017-11-18 NOTE — Patient Outreach (Signed)
[facility]) Care Management  11/18/2017  Kristen Garner 1951-02-06 [id number]  Third unsuccessful phone outreach attempt to patient.  Unable to leave message as recording stated voice mail box was full.   Plan:  Unsuccessful outreach letter will be mailed to patient.  If no reply from patient in 10 business days, will close pharmacy case.   Karrie Meres, PharmD, [place] [phone]

## 2017-11-19 ENCOUNTER — Other Ambulatory Visit: Payer: Self-pay | Admitting: *Deleted

## 2017-11-19 NOTE — Patient Outreach (Signed)
[place] [facility] [facility]) Care Management  11/19/2017  Kristen Garner Dec 03, 1950 [id number]  Unable to reach [name] by phone today. I left a HIPPA compliant voice message requesting a return call.   Plan: I will continue attempts to reach patient by phone.    [place] Management  [phone]

## 2017-11-20 ENCOUNTER — Other Ambulatory Visit: Payer: Self-pay | Admitting: *Deleted

## 2017-11-20 ENCOUNTER — Other Ambulatory Visit: Payer: Self-pay | Admitting: Internal Medicine

## 2017-11-20 ENCOUNTER — Other Ambulatory Visit: Payer: Self-pay | Admitting: Pharmacist

## 2017-11-20 NOTE — Patient Outreach (Signed)
[facility]) Care Management  11/20/2017  Kristen Garner 13-Jan-1951 [id number]  Received a message from [name] patient returned a call to her reporting continued difficulty affording medication refills---[facility] RN requested [place] Pharmacist make outreach attempt.   Three outreach attempts had been made unsuccessfully, most recently on 11/18/17---today's fourth outreach attempt, patient answered, HIPAA details verified and purpose of call explained to patient.    Medication assistance:  Patient reports 4 medications refilled and unable to afford---she is unable to report names of medications, states some are $5 others are $10.   Discussed Programmer, applications Help---she believes income and assets will meet requirements.  Discussed application options---she reports she prefers assistance with application as she does not have access to a computer.   Discussed with patient, [facility] Pharmacist would call [facility] in [place] to verify which medications were filled, co-pays and call her back.   Per [facility], amiodarone #60/$5, clopidogrel #90/$10, metoprolol succinate #180/$10.    Placed call back to patient, no answer, HIPAA compliant message left requesting call back.  At time of this note, no return call from patient.  Patient may be able to obtain 30 day supplies for lower co-pay than 90 days.   Plan:  Will follow-up with [facility] RN regarding potential visit---[facility] RN has reported patient does not wish to meet at her residence.   Karrie Meres, PharmD, [place] [phone]

## 2017-11-23 ENCOUNTER — Other Ambulatory Visit: Payer: Self-pay | Admitting: *Deleted

## 2017-11-23 NOTE — Patient Outreach (Signed)
[facility]) Care Management  11/23/2017  Kristen Garner [birth year] [id number]   CSW attempted to reach patient to follow-up on referral from [place] for financial assistance/medicaid application. Patient did not answer [phone] and her voicemail box was full. CSW will try again later this week for a second attempt.    Raynaldo Opitz, LCSW [facility]  Clinical Social Worker cell #: [phone]

## 2017-11-23 NOTE — Patient Outreach (Signed)
[facility]) Care Management  11/23/2017  Kristen Garner 05-22-1951 [id number]   Kristen [place] McCollumis a 66 y.o.femalewithhistory ofanxiety, depression, right breast cancer, CAD/stent placement x3 in November 6384, chronic systolic heart failure, stage III chronic kidney disease, hyperlipidemia, hypertension, osteopenia, type 2 diabetes, paroxysmal atrial tachycardia, PVCs, herpes zoster,andright lower extremity of skin cancer.  The [place] Management Team including nursing, pharmacy, and social work have had difficulty maintaining contact with Kristen Garner. However, she reached out at the end of last week and our pharmacist Lennette Bihari Reudinger was able to speak with Kristen Garner to ascertain her pharmacy needs. Unfortunately, when he returned a call to her to provide information, he was again unable to reach her.   I called Kristen Garner today but was unable to reach her or leave a message.   Plan: I will continue to collaborate with the [place] team (PharmD, LCSW) and will try again to reach Kristen Garner by phone.    [place] Management  [phone]

## 2017-11-25 ENCOUNTER — Other Ambulatory Visit: Payer: Self-pay | Admitting: Pharmacist

## 2017-11-25 NOTE — Patient Outreach (Signed)
[name]) Care Management  11/25/2017  Kristen Garner 1951/11/05 [id number]  [facility] Pharmacist had reached patient via phone on 11/20/17 after she reported difficulty obtaining some refill prescriptions.  A return call to her later that day was unanswered and no return call from patient yet.  Unsuccessful phone outreach to patient today, unable to leave message and voicemail was full.    Noted per chart [facility] RN and LCSW having difficulty reaching patient as well.   Plan:  [facility] pharmacist will make a third phone outreach attempt to patient within the next week.   Karrie Meres, PharmD, [place] [phone]

## 2017-11-26 ENCOUNTER — Ambulatory Visit: Payer: PPO | Admitting: "Endocrinology

## 2017-11-26 ENCOUNTER — Other Ambulatory Visit: Payer: Self-pay | Admitting: *Deleted

## 2017-11-26 NOTE — Patient Outreach (Signed)
[facility]) Care Management  11/26/2017  Kristen Garner 13-Apr-1951 [id number]  Call received from Kristen Garner today. She agreed to an in person visit at [facility] with our care management team (pharmacy, social work, nursing) so that we can assess her current needs and progress with chronic disease self care, medication management and assistance needs, and community resource resource needs.   Kristen Garner states she is not taking her medications as prescribed and doesn't have all of them because she can't afford them. She is not self monitoring or performing self- health management activities as she doesn't have needed resources, support, or knowledge.   Our team has had great difficulty establishing and maintaining contact with Kristen Garner. I notified my team mates that I would be meeting with Kristen Garner who agreed to meet with our entire team on Tuesday, December 01, 2017 if the other team members are available.   Plan: Office Visit 12/01/17 10am.    [place] [facility] Management  [phone]

## 2017-11-26 NOTE — Patient Outreach (Signed)
[place] [facility]) Care Management  11/26/2017  RHILEY TARVER Jan 28, 1951 [id number]   CSW made second attempt to try to reach patient, but phone # [phone] went straight to voicemail and CSW was unable to leave a voicemail as her mailbox was full.   CSW will mail letter to patient & make third attempt to reach patient in 1 week.    Raynaldo Opitz, LCSW [facility]  Clinical Social Worker cell #: [phone]

## 2017-11-30 ENCOUNTER — Ambulatory Visit: Payer: Self-pay | Admitting: *Deleted

## 2017-12-01 ENCOUNTER — Other Ambulatory Visit: Payer: Self-pay | Admitting: Pharmacist

## 2017-12-01 ENCOUNTER — Encounter: Payer: Self-pay | Admitting: *Deleted

## 2017-12-01 ENCOUNTER — Other Ambulatory Visit: Payer: Self-pay | Admitting: *Deleted

## 2017-12-01 NOTE — Patient Outreach (Signed)
[place] [facility]) Care Management  [facility] Social Work  12/01/2017  Kristen Garner 05-15-1951 [id number]   Encounter Medications:  Outpatient Encounter Medications as of 12/01/2017  Medication Sig  . albuterol (PROVENTIL HFA;VENTOLIN HFA) 108 (90 Base) MCG/ACT inhaler Inhale 2 puffs into the lungs every 4 (four) hours as needed for wheezing or shortness of breath.  . ALPRAZolam (XANAX) 1 MG tablet Take 1 mg by mouth 2 (two) times daily.   Marland Kitchen amiodarone (PACERONE) 200 MG tablet Take 1 tablet (200 mg total) 2 (two) times daily by mouth.  Marland Kitchen aspirin EC 81 MG EC tablet Take 1 tablet (81 mg total) by mouth daily.  . calcium carbonate (ANTACID) 420 MG CHEW chewable tablet Chew 420 mg daily as needed by mouth for indigestion or heartburn.  . clopidogrel (PLAVIX) 75 MG tablet Take 1 tablet (75 mg total) daily by mouth.  . escitalopram (LEXAPRO) 10 MG tablet Take 10 mg daily by mouth.  . furosemide (LASIX) 40 MG tablet Take 1 tablet (40 mg total) daily by mouth.  Marland Kitchen HYDROcodone-acetaminophen (NORCO/VICODIN) 5-325 MG tablet Take one tab po q 6 hrs prn pain (Patient taking differently: Take 1-2 tablets by mouth every 6 (six) hours as needed for moderate pain. Take one tab po q 6 hrs prn pain)  . methocarbamol (ROBAXIN) 500 MG tablet Take 1 tablet (500 mg total) by mouth 3 (three) times daily. (Patient not taking: Reported on 10/04/2017)  . metoprolol succinate (TOPROL-XL) 100 MG 24 hr tablet Take 1 tablet (100 mg total) 2 (two) times daily by mouth. Take with or immediately following a meal.  . mometasone (NASONEX) 50 MCG/ACT nasal spray Place 2 sprays into the nose daily.  . nitroGLYCERIN (NITROSTAT) 0.4 MG SL tablet Place 0.4 mg under the tongue every 5 (five) minutes as needed for chest pain.  . pantoprazole (PROTONIX) 40 MG tablet Take 1 tablet (40 mg total) by mouth daily.  . pantoprazole (PROTONIX) 40 MG tablet TAKE 1 TABLET(40 MG) BY MOUTH DAILY  . Potassium Chloride ER 20 MEQ TBCR  Take 20 mEq by mouth daily. (Patient taking differently: Take 20 mEq every other day by mouth. )  . rOPINIRole (REQUIP) 1 MG tablet Take 1 mg at bedtime by mouth.  . rosuvastatin (CRESTOR) 5 MG tablet Take 1 tablet (5 mg total) daily by mouth.  . zolpidem (AMBIEN) 10 MG tablet Take 10 mg by mouth at bedtime.   No facility-administered encounter medications on file as of 12/01/2017.     Functional Status:  In your present state of health, do you have any difficulty performing the following activities: 11/03/2017 10/26/2017  Hearing? N N  Vision? N N  Difficulty concentrating or making decisions? N N  Walking or climbing stairs? N N  Dressing or bathing? N N  Doing errands, shopping? N N  Preparing Food and eating ? N -  Using the Toilet? N -  In the past six months, have you accidently leaked urine? N -  Do you have problems with loss of bowel control? N -  Managing your Medications? Y -  Managing your Finances? Y -  Housekeeping or managing your Housekeeping? N -  Some recent data might be hidden    Fall/Depression Screening:  No flowsheet data found.  Assessment:   CSW had received referral from [place], Janalyn Shy that patient states she is frequently unable to afford her medications; has never applied for Medicaid; pays mortgage on mobile home; has outstanding medical debt,  behind on utilities. RNCM had setup with patient at [facility] (as patient had declined home visit) with [facility] [facility], Kristen Garner, & this CSW). CSW discussed with patient Medicaid income eligibility and income limits and provided patient with [place] Medicaid application. Patient plans to review it, complete as much as she can and will call CSW to setup a time to meet at [place] in [place] to submit it and meet with a caseworker.   CSW & RNCM also spoke with patient about advance directives - packet was provided to patient and encouraged her to read over it and discuss it with  her family, mainly her sister, Kristen Garner whom she has indicated would like to be her HCPOA. Patient instructed to call if she has any questions or concerns about advance directives and will have RNCM or CSW scan completed and notarized HCPOA and/or Living Will into Epic.    Plan:  CSW will await call from patient to schedule date/time to meet at DSS to submit Medicaid application, but will call patient if no call had been received in 2 weeks.   [facility]N CM Care Plan Problem One     Most Recent Value  Care Plan Problem One  Financial concerns as evidenced by inability to pay for medications and have hot water heater installed in her home.   Role Documenting the Problem One  Clinical Social Worker  Care Plan for Problem One  Active  THN Long Term Goal   Over the next 60 days, patient will work with CSW to complete and submit Medicaid application for financial assistance.   THN Long Term Goal Start Date  12/01/17  Interventions for Problem One Long Term Goal  CSW provided patient with paper copy of medicaid application along with information on items that she will need to provide along with application.   THN CM Short Term Goal #1   Over the next 30 days, patient will review Medicaid application and complete what she can. Patient will call CSW to setup a date and time to meet at DSS to submit application to caseworker.   THN CM Short Term Goal #1 Start Date  12/01/17  Interventions for Short Term Goal #1  CSW provided application to patient and encouraged her to complete what she can and call if she has any questions or concerns.     [facility] CM Care Plan Problem Two     Most Recent Value  Care Plan Problem Two  Advance Directives  Role Documenting the Problem Two  Clinical Social Worker  Care Plan for Problem Two  Active  Interventions for Problem Two Long Term Goal   RNCM encouraged patient to have conversation with family about advance directives and instructed her to call if any questions or concerns and  will scan into Epic once completed and notarized.   THN Long Term Goal  Over the next 60 days patient will review Advance Directive packet provided to patient and complete.   THN Long Term Goal Start Date  12/01/17       Raynaldo Opitz, [place] Social Worker cell #: [phone]

## 2017-12-01 NOTE — Patient Outreach (Signed)
[facility]) Care Management  [place]   12/01/2017  Kristen Garner 1951-07-05 [id number]  Subjective:  Initial visit with patient today in conjunction with [facility] LCSW Claiborne Billings and [facility] RN Delrae Rend.  Patient was met at [facility] as she preferred to not meet at her residence.   Patient reports cost of medications is a concern to her.  She reports she did pick up her heart related medications.  She reports she did not know she could ask for medication to be filled for 30 day supply instead of 90 day supply.     Patient reports she is not able to review her medications at time of visit.   She reports difficulty swallowing potassium tablets.   She was also concerned with cost of alprazolam and zolpidem.    Objective:   Encounter Medications: Outpatient Encounter Medications as of 12/01/2017  Medication Sig  . albuterol (PROVENTIL HFA;VENTOLIN HFA) 108 (90 Base) MCG/ACT inhaler Inhale 2 puffs into the lungs every 4 (four) hours as needed for wheezing or shortness of breath.  . ALPRAZolam (XANAX) 1 MG tablet Take 1 mg by mouth 2 (two) times daily.   Marland Kitchen amiodarone (PACERONE) 200 MG tablet Take 1 tablet (200 mg total) 2 (two) times daily by mouth.  Marland Kitchen aspirin EC 81 MG EC tablet Take 1 tablet (81 mg total) by mouth daily.  . calcium carbonate (ANTACID) 420 MG CHEW chewable tablet Chew 420 mg daily as needed by mouth for indigestion or heartburn.  . clopidogrel (PLAVIX) 75 MG tablet Take 1 tablet (75 mg total) daily by mouth.  . escitalopram (LEXAPRO) 10 MG tablet Take 10 mg daily by mouth.  . furosemide (LASIX) 40 MG tablet Take 1 tablet (40 mg total) daily by mouth.  Marland Kitchen HYDROcodone-acetaminophen (NORCO/VICODIN) 5-325 MG tablet Take one tab po q 6 hrs prn pain (Patient taking differently: Take 1-2 tablets by mouth every 6 (six) hours as needed for moderate pain. Take one tab po q 6 hrs prn pain)  . methocarbamol (ROBAXIN) 500 MG tablet Take 1 tablet (500 mg total) by  mouth 3 (three) times daily. (Patient not taking: Reported on 10/04/2017)  . metoprolol succinate (TOPROL-XL) 100 MG 24 hr tablet Take 1 tablet (100 mg total) 2 (two) times daily by mouth. Take with or immediately following a meal.  . mometasone (NASONEX) 50 MCG/ACT nasal spray Place 2 sprays into the nose daily.  . nitroGLYCERIN (NITROSTAT) 0.4 MG SL tablet Place 0.4 mg under the tongue every 5 (five) minutes as needed for chest pain.  . pantoprazole (PROTONIX) 40 MG tablet Take 1 tablet (40 mg total) by mouth daily.  . pantoprazole (PROTONIX) 40 MG tablet TAKE 1 TABLET(40 MG) BY MOUTH DAILY  . Potassium Chloride ER 20 MEQ TBCR Take 20 mEq by mouth daily. (Patient taking differently: Take 20 mEq every other day by mouth. )  . rOPINIRole (REQUIP) 1 MG tablet Take 1 mg at bedtime by mouth.  . rosuvastatin (CRESTOR) 5 MG tablet Take 1 tablet (5 mg total) daily by mouth.  . zolpidem (AMBIEN) 10 MG tablet Take 10 mg by mouth at bedtime.   No facility-administered encounter medications on file as of 12/01/2017.     Functional Status: In your present state of health, do you have any difficulty performing the following activities: 11/03/2017 10/26/2017  Hearing? N N  Vision? N N  Difficulty concentrating or making decisions? N N  Walking or climbing stairs? N N  Dressing or  bathing? N N  Doing errands, shopping? N N  Preparing Food and eating ? N -  Using the Toilet? N -  In the past six months, have you accidently leaked urine? N -  Do you have problems with loss of bowel control? N -  Managing your Medications? Y -  Managing your Finances? Y -  Housekeeping or managing your Housekeeping? N -  Some recent data might be hidden    Fall/Depression Screening: Fall Risk  11/03/2017  Falls in the past year? No   No flowsheet data found.    Assessment:  Patient counseling:  Counseled patient potassium chloride tabs can be broke in half or dissolved in water---must be sure to drink entire  amount of water if dissolving tablet.  She reports she plans to continue to crush---was counseled to not crush tablet.    Counseled patient Part D plan may have a prior authorization on zolpidem due to increased risk of side effects in >58 year old population.    She was counseled to take medications as prescribed by her prescribers.    Patient assistance:  Discussed IT trainer [facility]) Extra Help including requirements and information needed to apply.  Counseled she can go to [employer] office, call [employer]SA, or complete application online.  She prefers to gather her information and call [facility] Pharmacist to assist her with application.  Discussed Tier 1 and Tier 2 co-pay information based of off plan summary of benefits.   Drug interactions:  Increased risk of sedation and CNS depression with concomitant use of alprazolam, zolpidem and hydrocodone.    Plan:  Will route note to PCP.   Patient agrees to call [facility] Pharmacist when she is ready to apply for [facility] Extra Help.    Patient reminded to bring medications/be able to discuss medications during next visit.   If patient does not call, will place outreach call attempt to patient in the next 2 weeks.   Karrie Meres, PharmD, [place] [phone]

## 2017-12-01 NOTE — Patient Outreach (Signed)
[facility]) Care Management   12/01/2017  [name] Jan 08, 1951 [id number]  Kristen Garner is an 67 y.o. female with a past medical history ofanxiety, depression, right breast cancer, CAD/stent placement x3 in November 7253, chronic systolic heart failure, stage III chronic kidney disease, hyperlipidemia, hypertension, osteopenia, type 2 diabetes, paroxysmal atrial tachycardia, PVCs, herpes zoster,andright lower extremity of skin cancer.  Kristen Garner was referred to [facility] after her recent admission to the hospital for treatment of colitis. Our team, including nursing, pharmacy, and social work have had difficulty maintaining contact with Kristen Garner. However, we were able to schedule a collaborative meeting today at [facility] with Kristen Garner and the entire team.  Subjective: "If I can get my finances sorted out, it will help everything."  Objective:  BP 120/76   Pulse 65   Ht 1.676 m (5' 6")   Wt 168 lb (76.2 kg)   SpO2 98%   BMI 27.12 kg/m   Review of Systems  Constitutional: Negative.   HENT: Negative.   Eyes: Negative.   Respiratory: Negative.  Negative for cough, shortness of breath and wheezing.   Cardiovascular: Negative.  Negative for chest pain, palpitations and leg swelling.  Gastrointestinal: Negative.  Negative for blood in stool, constipation, diarrhea, heartburn, nausea and vomiting.  Genitourinary: Negative.   Musculoskeletal: Positive for myalgias. Negative for falls.  Skin: Negative.   Neurological: Negative.   Psychiatric/Behavioral: Negative.     Physical Exam  Constitutional: She is oriented to person, place, and time. Vital signs are normal. She appears well-developed and well-nourished. She is active. She does not have a sickly appearance. She does not appear ill.  Cardiovascular: Normal rate, regular rhythm and normal heart sounds.  Respiratory: Effort normal. She has wheezes. She has no  rhonchi. She has no rales.  GI: Soft. Bowel sounds are normal. She exhibits no distension. There is no tenderness.  Neurological: She is alert and oriented to person, place, and time.  Skin: Skin is warm and dry.  Psychiatric: She has a normal mood and affect. Her speech is normal and behavior is normal. Judgment and thought content normal. Cognition and memory are normal.    Encounter Medications:   Outpatient Encounter Medications as of 12/01/2017  Medication Sig  . albuterol (PROVENTIL HFA;VENTOLIN HFA) 108 (90 Base) MCG/ACT inhaler Inhale 2 puffs into the lungs every 4 (four) hours as needed for wheezing or shortness of breath.  . ALPRAZolam (XANAX) 1 MG tablet Take 1 mg by mouth 2 (two) times daily.   Marland Kitchen amiodarone (PACERONE) 200 MG tablet Take 1 tablet (200 mg total) 2 (two) times daily by mouth.  Marland Kitchen aspirin EC 81 MG EC tablet Take 1 tablet (81 mg total) by mouth daily.  . calcium carbonate (ANTACID) 420 MG CHEW chewable tablet Chew 420 mg daily as needed by mouth for indigestion or heartburn.  . clopidogrel (PLAVIX) 75 MG tablet Take 1 tablet (75 mg total) daily by mouth.  . escitalopram (LEXAPRO) 10 MG tablet Take 10 mg daily by mouth.  . furosemide (LASIX) 40 MG tablet Take 1 tablet (40 mg total) daily by mouth.  Marland Kitchen HYDROcodone-acetaminophen (NORCO/VICODIN) 5-325 MG tablet Take one tab po q 6 hrs prn pain (Patient taking differently: Take 1-2 tablets by mouth every 6 (six) hours as needed for moderate pain. Take one tab po q 6 hrs prn pain)  . methocarbamol (ROBAXIN) 500 MG tablet Take 1 tablet (500 mg total) by mouth 3 (three)  times daily. (Patient not taking: Reported on 10/04/2017)  . metoprolol succinate (TOPROL-XL) 100 MG 24 hr tablet Take 1 tablet (100 mg total) 2 (two) times daily by mouth. Take with or immediately following a meal.  . mometasone (NASONEX) 50 MCG/ACT nasal spray Place 2 sprays into the nose daily.  . nitroGLYCERIN (NITROSTAT) 0.4 MG SL tablet Place 0.4 mg under the  tongue every 5 (five) minutes as needed for chest pain.  . pantoprazole (PROTONIX) 40 MG tablet Take 1 tablet (40 mg total) by mouth daily.  . pantoprazole (PROTONIX) 40 MG tablet TAKE 1 TABLET(40 MG) BY MOUTH DAILY  . Potassium Chloride ER 20 MEQ TBCR Take 20 mEq by mouth daily. (Patient taking differently: Take 20 mEq every other day by mouth. )  . rOPINIRole (REQUIP) 1 MG tablet Take 1 mg at bedtime by mouth.  . rosuvastatin (CRESTOR) 5 MG tablet Take 1 tablet (5 mg total) daily by mouth.  . zolpidem (AMBIEN) 10 MG tablet Take 10 mg by mouth at bedtime.   Assessment:  67 year old female with multiple chronic health conditions living in [place]. Patient sees primary care provider but is not routinely taking prescribed medications, is experiencing financial barriers, and has knowledge deficits related to resources available to her and to her disease states.   Collaborative visit made today with patient, pharmacist, social worker, and nurse present. Please see documentation by Isabell Jarvis PharmD and Raynaldo Opitz LCSW.   Medication Management Needs/Concerns - Kristen Garner says she would like to take her medications as prescribed but financial barriers exist that preclude her adherence. [facility] Pharmacist Lennette Bihari Reudinger discussed Kristen Garner's medications with her in detail. In addition, Dr. Genia Hotter provided education regarding necessary documentation for Medicare Low Income Subsidy to Kristen Garner. She is to gather requested documentation and contact Dr. Genia Hotter who will help her complete an on-line application.   Financial Barriers and Community Resources - Raynaldo Opitz LCSW discussed Medicaid eligibility with Kristen Garner today and advised her about guidelines. Kristen Garner is to gather requested documentation and let [name] know when she has gathered her information. [name] has offered to meet Kristen Garner at [place] to assist with the Medicaid application process.    Chronic Health Conditions and Knowledge Deficits - Mrs. Courter is currently not consistently self monitoring for any of her chronic health conditions. We discussed a plan of care which includes self health monitoring.   CHF - Mrs. Termine last saw her cardiology provider Bernerd Pho, [facility] at [facility] in [place] on 10/12/17. Mrs. Lasota is not weighing daily. I requested a digital scale to be drop shipped to her home. I spent time today reviewing education re: CHF signs and symptoms and advised Mrs. Nylund to weigh daily, record, and call her provider for weight gain of 3# overnight or 5# in a week. I provided her with a documentation tool and demonstrated how to use it.   DM - Mrs. Lague does not currently check her cbg's. She says she has a monitor but that it is old and does not have a battery. She also does not have monitoring supplies. I reached out to the patient care coordinator @ [place] (PCP office) to request an order be sent to the pharmacy for a new one with supplies.   Mrs. Schaad had an appointment with [name] on 11/26/16 @ 2:30pm but missed it. She says she does not currently have money for the appointment copay.   The last noted  HgA1C on 10/29/17 was 6.8 (compared with 8.5 from 1 year ago). Mrs. Schwenke has a very limited knowledge of her prescribed plan of care or prescribed carb modified diet. I provided [employer] and discussed a basic carb modified diet with Mrs. Dayal today.    HTN - Mrs. Mastel doesn't know what her blood pressure is and is not taking her prescribed medications (see above). Today, I provided a blood pressure monitor, demonstrated its use, and provided a documentation tool for her, asking her to check her bp daily and record over the next few weeks so that she can develop a baseline idea of her usual blood pressure.   Mrs. Uher has limited knowledge of the signs and symptoms and I provided [employer] and reviewed signs and symptoms of hypo and hypertension today, providing direction as to when she should contact her provider for findings outside established parameters or signs/symptoms.   Colitis - Mrs. Grills denies any recurrent or new symptoms related to her recent episode of colitis. She has an appointment with [facility] (gastroenterology) on 12/28/17 @ 8:30am and is aware of this appointment. We wrote the appointment in her calendar today.    Plan:   Mrs. Doyel will work with our pharmacist and Education officer, museum and will secure requested documentation then call them to follow up.   Mrs. Skibinski will begin self monitoring interventions over the next few weeks.   I will follow up with Mrs. Florea by phone over the next few weeks to assess her progress and needs.   [facility] CM Care Plan Problem One     Most Recent Value  Care Plan Problem One  Medication Management Concerns and Medication Assistance Needs as evidenced by patient report of financial barriers and knowledge deficits about medication management  Role Documenting the Problem One  Care Management [place] for Problem One  Active  THN Long Term Goal   Over the next 60 days, patient will verbalize understanding of long term plan for medication adherence  THN Long Term Goal Start Date  11/04/17  Interventions for Problem One Long Term Goal  collaborative visit with Pharmd,  medicaitons discussed  THN CM Short Term Goal #1   Over the next 30 days, patient will work with [place] [facility] pharmacy team to address financial barriers related to medication management  THN CM Short Term Goal #1 Start Date  11/04/17  Interventions for Short Term Goal #1  collaboration with PharmD re: LIS eligibility  THN CM Short Term Goal #2   Over the next 30 days, patient will verbalize understanding of plan for obtaining currently needed medications  [facility]N CM Short Term Goal #2 Start Date  11/04/17  Interventions  for Short Term Goal #2  medications reviewed,  process for pharmacy follow up reviewed    [facility] CM Care Plan Problem Two     Most Recent Value  Care Plan Problem Two  Knowledge Deficits related to Independent Self-Health Management of Chronic Disease States  Role Documenting the Problem Two  Care Management [place] for Problem Two  Active  Interventions for Problem Two Long Term Goal   reviewed plan of care for self health mangement of chronic disease states  THN Long Term Goal  Over the next 60 days, patient will demonstrate understanding of self health management interventions to promote independent self health management and adherence to prescribed plan of care  [facility] Long Term Goal Start Date  11/04/17  THN CM Short Term Goal #1   Over the next 14 days, patient will meet with RNCM in person to begin development of plan for independent self-health management plan of care  [facility] CM Short Term Goal #1 Start Date  11/04/17  [facility] CM Short Term Goal #1 Met Date   12/01/17  THN CM Short Term Goal #2   Over the next 30 days, patient will self monitor bp and record in documentation tool  THN CM Short Term Goal #2 Start Date  12/01/17  Interventions for Short Term Goal #2  bp monitor provided with demonstration of use,  documentation tool and instruction provided,  Emm[employer] educational tools providedc  THN CM Short Term Goal #3   Over the next 14 days, patient will report receipt of new cbg monitor and will schedule follow up visit with RNCM for further instruction re: use  [facility]N CM Short Term Goal #3 Start Date  12/01/17  Interventions for Short Term Goal #3  follow up with pharmacy re: Rx for cbg monitor  THN CM Short Term Goal #4  Over the next 30 days, patient will weigh daily and record as evidenced by review of documentation tool  [facility]N CM Short Term Goal #4 Start Date  12/01/17  Interventions of Short Term Goal #4  request for drop ship of scales made,  documentation tool provided with  instruction and rationale for daily weights and recording i      [place] [facility]  [phone]

## 2017-12-03 ENCOUNTER — Ambulatory Visit: Payer: Self-pay | Admitting: *Deleted

## 2017-12-08 ENCOUNTER — Other Ambulatory Visit: Payer: Self-pay | Admitting: *Deleted

## 2017-12-08 NOTE — Patient Outreach (Signed)
[facility]) Care Management  12/08/2017  DORIS GRUHN 1951/08/19 [id number]  Unable to reach Mrs. Gosling by phone today. I left a HIPPA compliant voice message requesting a return call.   Plan: I will attempt to contact Mrs Charity by phone again next week.    [place] [facility]  [phone]

## 2017-12-15 ENCOUNTER — Other Ambulatory Visit: Payer: Self-pay | Admitting: *Deleted

## 2017-12-15 ENCOUNTER — Other Ambulatory Visit: Payer: Self-pay | Admitting: Pharmacist

## 2017-12-15 NOTE — Patient Outreach (Signed)
[place] [facility]) Care Management  12/15/2017  Kristen Garner 1951-04-08 [id number]  Unsuccessful phone outreach attempt to patient.  HIPAA compliant message left requesting return call.   Plan:  Will make second phone outreach attempt within the next week.   Karrie Meres, PharmD, [place] [phone]

## 2017-12-16 NOTE — Patient Outreach (Signed)
Request received from Kelly Harrison, LCSW to mail patient personal care resources.  Information mailed today. 

## 2017-12-16 NOTE — Patient Outreach (Signed)
[place] [facility]) Care Management  12/16/2017  TAMAIYA BUMP Aug 27, 1951 [id number]   CSW had received message from [place] that patient called saying that she was out of food and went to [employer] but they told her to come back tomorrow. CSW had been trying to get in touch with patient since yesterday afternoon, but her phone [phone]) goes straight to voicemail. CSW left HIPPA compliant voicemail & asked that she call CSW back to get details of food banks in the area. CSW will await response back from patient or try back in 1 week. CSW will also have CMA mail list of food banks in [place].    Raynaldo Opitz, LCSW [facility]  Clinical Social Worker cell #: [phone]

## 2017-12-17 ENCOUNTER — Other Ambulatory Visit: Payer: Self-pay | Admitting: *Deleted

## 2017-12-17 ENCOUNTER — Other Ambulatory Visit: Payer: Self-pay | Admitting: Pharmacist

## 2017-12-17 NOTE — Patient Outreach (Signed)
[place] [facility]) Care Management  12/17/2017  Kristen Garner 12/18/1950 [id number]  Second unsuccessful phone outreach attempt to patient.  HIPAA compliant message left requesting call back as phone call went directly to voicemail.   Plan:  Will make third phone outreach attempt to patient next week if no return call.   Karrie Meres, PharmD, [place] [phone]

## 2017-12-17 NOTE — Patient Outreach (Signed)
[place] [facility]) Care Management  12/17/2017  DANAHI REDDISH Oct 26, 1951 [id number]  Unable to reach Mrs. Degraffenreid by phone today. HIPPA compliant voice message was left requesting a return call.   Plan: I will reach out to Mrs. Scheel next week.    [place] Management  [phone]

## 2017-12-21 ENCOUNTER — Other Ambulatory Visit: Payer: Self-pay | Admitting: Pharmacist

## 2017-12-21 ENCOUNTER — Other Ambulatory Visit: Payer: Self-pay | Admitting: *Deleted

## 2017-12-21 NOTE — Patient Outreach (Signed)
[place] [facility]) Care Management  12/21/2017  RAYSHELL GOECKE 09-04-1951 [id number]  I attempted to reach Mrs. Outland by phone today to follow up on her chronic disease management needs, specifically education around self health management of CHF, HTN, and DMII. I was unable to reach Mrs. Westerhoff but left a message requesting a return call.   Plan: I have referred Mrs. Badalamenti to our telephonic case management team as I am transitioning to a new role and because Mrs. Gibbon has not agreed to allow a nurse or other clinician to meet with her in her home.    [place] Management  [phone]

## 2017-12-21 NOTE — Patient Outreach (Signed)
[place] [facility]) Care Management  12/21/2017  Kristen Garner June 10, 1951 [id number]  Third unsuccessful phone outreach attempt to patient.  No answer, HIPAA compliant message left requesting return call.  Plan:  Will send outreach letter to patient.  If no reply from patient after 10 business days, will close case.    Karrie Meres, PharmD, [place] [phone]

## 2017-12-22 ENCOUNTER — Other Ambulatory Visit: Payer: Self-pay

## 2017-12-22 ENCOUNTER — Other Ambulatory Visit: Payer: Self-pay | Admitting: *Deleted

## 2017-12-22 NOTE — Patient Outreach (Signed)
[place] [facility]) Care Management  12/22/2017  TRESSIA LABRUM 1951-02-08 [id number]   1st telephone call to the patient for an initial assessment. No answer.  HIPAA compliant voicemail left with contact information.  Plan: RN Health Coach will make an outreach attempt to the patient within the next three business days.  Lazaro Arms RN, BSN, [place] Direct Dial:  [phone] Fax: [phone]

## 2017-12-23 ENCOUNTER — Other Ambulatory Visit: Payer: Self-pay

## 2017-12-23 NOTE — Patient Outreach (Signed)
[facility] [facility]) Care Management  12/23/2017  Kristen Garner 07-Dec-1950 [id number]   2nd telephone call to the patient for initial assessment. No answer.  HIPAA compliant message left with contact information.  Plan:  RN Health Coach will make an outreach attempt to the patient within the next three business days.  Lazaro Arms RN, BSN, [place] Direct Dial:  [phone] Fax: [phone]

## 2017-12-23 NOTE — Patient Outreach (Signed)
[place] [facility]) Care Management  12/23/2017  Kristen Garner 03/01/1951 [id number]   CSW made a second attempt to try and contact patient today to follow-up on resources mailed to patient, without success. A HIPPA compliant message was left for patient on voicemail (ph#: [phone]). CSW is currently awaiting a return call. CSW will send unsuccessful outreach letter to patient & make a third outreach attempt within the next week, if CSW does not receive a return call from patient in the meantime.    Raynaldo Opitz, LCSW [facility]  Clinical Social Worker cell #: [phone]

## 2017-12-24 ENCOUNTER — Other Ambulatory Visit: Payer: Self-pay

## 2017-12-24 NOTE — Patient Outreach (Signed)
[place] [facility]) Care Management  12/24/2017  Kristen Garner 02-Oct-1951 [id number]   3rd telephone call placed to the patient for an initial assessment. No answer. HIPAA compliant voicemail left with contact information.  Plan: RN Health Coach will send letter to attempt outreach.  If no response within ten business days will proceed with case closure.     Lazaro Arms RN, BSN, [place] Direct Dial:  [phone] Fax: [phone]

## 2017-12-25 ENCOUNTER — Ambulatory Visit: Payer: Self-pay

## 2017-12-28 ENCOUNTER — Encounter: Payer: Self-pay | Admitting: Nurse Practitioner

## 2017-12-28 ENCOUNTER — Telehealth: Payer: Self-pay | Admitting: Nurse Practitioner

## 2017-12-28 ENCOUNTER — Ambulatory Visit: Payer: PPO | Admitting: Nurse Practitioner

## 2017-12-28 NOTE — Telephone Encounter (Signed)
PATIENT WAS A NO SHOW AND LETTER SENT  °

## 2017-12-28 NOTE — Telephone Encounter (Signed)
Noted  

## 2017-12-30 ENCOUNTER — Other Ambulatory Visit: Payer: Self-pay | Admitting: *Deleted

## 2017-12-31 NOTE — Patient Outreach (Signed)
[facility]) Care Management  12/31/2017  Kristen Garner 09-Mar-1951 [id number]   CSW made 3rd & final attempt to reach patient to follow-up on initial assessment & offer assistance with community resources but no answer & patient's voicemail box was full.   CSW will perform a case closure on patient, as CSW has been unable to establish and/or maintain contact with the patient. CSW will notify patient's Healthcoach, [place] Pharmacist, [name] with [facility] of CSW's plans to close patient's case.   CSW will fax an update to patient's Primary Care Physician, Dr. Sharilyn Sites to ensure that they are aware of CSW's involvement with patient's plan of care.    Raynaldo Opitz, LCSW [facility]  Clinical Social Worker cell #: [phone]

## 2018-01-04 ENCOUNTER — Other Ambulatory Visit: Payer: Self-pay | Admitting: Pharmacist

## 2018-01-04 NOTE — Patient Outreach (Signed)
[place] [facility]) Care Management  01/04/2018  Kristen Garner 10/12/1951 [id number]  Three phone outreach attempts were made and an outreach letter was mailed to patient with no reply.    Plan:  Pharmacy episode closed due to inability to maintain contact with patient.   [facility] RN Traci updated of [place] case closure.   Karrie Meres, PharmD, [place] [phone]

## 2018-01-07 ENCOUNTER — Other Ambulatory Visit: Payer: Self-pay

## 2018-01-07 NOTE — Patient Outreach (Signed)
[place] [facility]) Care Management  01/07/2018  Kristen Garner 1951-03-08 [id number]   Patient has not responded to calls or letter. Will proceed with case closure.     Plan: Will notify care management assistant of case status.  Lazaro Arms RN, BSN, [place] Direct Dial:  [phone] Fax: [phone]

## 2018-01-15 DIAGNOSIS — G894 Chronic pain syndrome: Secondary | ICD-10-CM | POA: Diagnosis not present

## 2018-01-15 DIAGNOSIS — E118 Type 2 diabetes mellitus with unspecified complications: Secondary | ICD-10-CM | POA: Diagnosis not present

## 2018-01-15 DIAGNOSIS — E6609 Other obesity due to excess calories: Secondary | ICD-10-CM | POA: Diagnosis not present

## 2018-01-15 DIAGNOSIS — Z1389 Encounter for screening for other disorder: Secondary | ICD-10-CM | POA: Diagnosis not present

## 2018-01-15 DIAGNOSIS — E782 Mixed hyperlipidemia: Secondary | ICD-10-CM | POA: Diagnosis not present

## 2018-01-15 DIAGNOSIS — Z6830 Body mass index (BMI) 30.0-30.9, adult: Secondary | ICD-10-CM | POA: Diagnosis not present

## 2018-01-15 DIAGNOSIS — E1140 Type 2 diabetes mellitus with diabetic neuropathy, unspecified: Secondary | ICD-10-CM | POA: Diagnosis not present

## 2018-02-15 LAB — GLUCOSE, POCT (MANUAL RESULT ENTRY): POC GLUCOSE: 162 mg/dL — AB (ref 70–99)

## 2018-03-15 DIAGNOSIS — E782 Mixed hyperlipidemia: Secondary | ICD-10-CM | POA: Diagnosis not present

## 2018-03-15 DIAGNOSIS — Z1389 Encounter for screening for other disorder: Secondary | ICD-10-CM | POA: Diagnosis not present

## 2018-03-15 DIAGNOSIS — G894 Chronic pain syndrome: Secondary | ICD-10-CM | POA: Diagnosis not present

## 2018-03-15 DIAGNOSIS — N183 Chronic kidney disease, stage 3 (moderate): Secondary | ICD-10-CM | POA: Diagnosis not present

## 2018-03-15 DIAGNOSIS — Z6831 Body mass index (BMI) 31.0-31.9, adult: Secondary | ICD-10-CM | POA: Diagnosis not present

## 2018-04-12 DIAGNOSIS — Z0001 Encounter for general adult medical examination with abnormal findings: Secondary | ICD-10-CM | POA: Diagnosis not present

## 2018-04-12 DIAGNOSIS — E782 Mixed hyperlipidemia: Secondary | ICD-10-CM | POA: Diagnosis not present

## 2018-04-12 DIAGNOSIS — Z1389 Encounter for screening for other disorder: Secondary | ICD-10-CM | POA: Diagnosis not present

## 2018-04-12 DIAGNOSIS — I509 Heart failure, unspecified: Secondary | ICD-10-CM | POA: Diagnosis not present

## 2018-04-12 DIAGNOSIS — E1140 Type 2 diabetes mellitus with diabetic neuropathy, unspecified: Secondary | ICD-10-CM | POA: Diagnosis not present

## 2018-04-12 DIAGNOSIS — F419 Anxiety disorder, unspecified: Secondary | ICD-10-CM | POA: Diagnosis not present

## 2018-04-12 DIAGNOSIS — Z6830 Body mass index (BMI) 30.0-30.9, adult: Secondary | ICD-10-CM | POA: Diagnosis not present

## 2018-04-12 DIAGNOSIS — I4891 Unspecified atrial fibrillation: Secondary | ICD-10-CM | POA: Diagnosis not present

## 2018-04-12 DIAGNOSIS — I2510 Atherosclerotic heart disease of native coronary artery without angina pectoris: Secondary | ICD-10-CM | POA: Diagnosis not present

## 2018-04-12 DIAGNOSIS — E118 Type 2 diabetes mellitus with unspecified complications: Secondary | ICD-10-CM | POA: Diagnosis not present

## 2018-04-12 DIAGNOSIS — N183 Chronic kidney disease, stage 3 (moderate): Secondary | ICD-10-CM | POA: Diagnosis not present

## 2018-05-07 DIAGNOSIS — E6609 Other obesity due to excess calories: Secondary | ICD-10-CM | POA: Diagnosis not present

## 2018-05-07 DIAGNOSIS — G894 Chronic pain syndrome: Secondary | ICD-10-CM | POA: Diagnosis not present

## 2018-05-07 DIAGNOSIS — Z6831 Body mass index (BMI) 31.0-31.9, adult: Secondary | ICD-10-CM | POA: Diagnosis not present

## 2018-06-07 DIAGNOSIS — F419 Anxiety disorder, unspecified: Secondary | ICD-10-CM | POA: Diagnosis not present

## 2018-06-07 DIAGNOSIS — E876 Hypokalemia: Secondary | ICD-10-CM | POA: Diagnosis not present

## 2018-06-07 DIAGNOSIS — Z6831 Body mass index (BMI) 31.0-31.9, adult: Secondary | ICD-10-CM | POA: Diagnosis not present

## 2018-06-07 DIAGNOSIS — E039 Hypothyroidism, unspecified: Secondary | ICD-10-CM | POA: Diagnosis not present

## 2018-06-07 DIAGNOSIS — G894 Chronic pain syndrome: Secondary | ICD-10-CM | POA: Diagnosis not present

## 2018-06-07 DIAGNOSIS — E559 Vitamin D deficiency, unspecified: Secondary | ICD-10-CM | POA: Diagnosis not present

## 2018-06-22 ENCOUNTER — Other Ambulatory Visit (HOSPITAL_COMMUNITY): Payer: Self-pay | Admitting: Family Medicine

## 2018-06-22 DIAGNOSIS — Z1231 Encounter for screening mammogram for malignant neoplasm of breast: Secondary | ICD-10-CM

## 2018-07-08 DIAGNOSIS — G4700 Insomnia, unspecified: Secondary | ICD-10-CM | POA: Diagnosis not present

## 2018-07-08 DIAGNOSIS — F419 Anxiety disorder, unspecified: Secondary | ICD-10-CM | POA: Diagnosis not present

## 2018-07-08 DIAGNOSIS — E6609 Other obesity due to excess calories: Secondary | ICD-10-CM | POA: Diagnosis not present

## 2018-07-08 DIAGNOSIS — Z1389 Encounter for screening for other disorder: Secondary | ICD-10-CM | POA: Diagnosis not present

## 2018-07-08 DIAGNOSIS — Z0000 Encounter for general adult medical examination without abnormal findings: Secondary | ICD-10-CM | POA: Diagnosis not present

## 2018-07-08 DIAGNOSIS — I4891 Unspecified atrial fibrillation: Secondary | ICD-10-CM | POA: Diagnosis not present

## 2018-07-08 DIAGNOSIS — E1140 Type 2 diabetes mellitus with diabetic neuropathy, unspecified: Secondary | ICD-10-CM | POA: Diagnosis not present

## 2018-07-08 DIAGNOSIS — Z6830 Body mass index (BMI) 30.0-30.9, adult: Secondary | ICD-10-CM | POA: Diagnosis not present

## 2018-07-08 DIAGNOSIS — E782 Mixed hyperlipidemia: Secondary | ICD-10-CM | POA: Diagnosis not present

## 2018-08-02 IMAGING — CT CT ABD-PELV W/O CM
2 of 4 series · 16 of 46 positions shown, 18 images · non-contrast
Comparison: None

CLINICAL DATA: Upper abdominal pain for 5 days, no bowel movement
coronary artery disease post PTCA, chronic systolic CHF, diabetes
mellitus, hypertension, former smoker

EXAM:
CT ABDOMEN AND PELVIS WITHOUT CONTRAST
TECHNIQUE: Multidetector CT imaging of the abdomen and pelvis was performed
following the standard protocol without IV contrast. Sagittal and
coronal MPR images reconstructed from axial data set. Patient drank
dilute oral contrast for exam

[Series 2: axial st · axial · 0.80mm/px · z∈[+1634,+2064]mm · 13 of 96 slices shown, 15 images]
[im 5/96  soft-tissue]
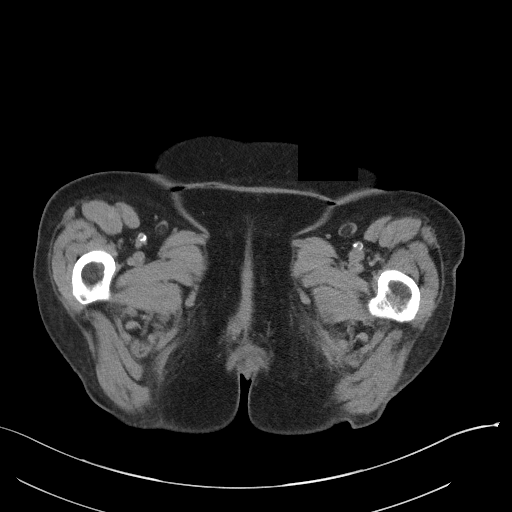
[im 5/96  bone]
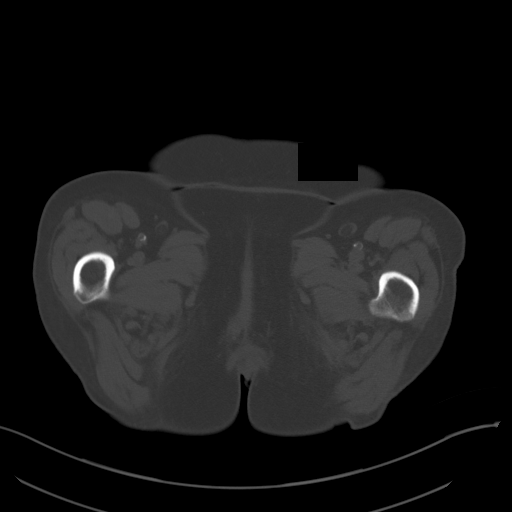
[im 13/96  soft-tissue]
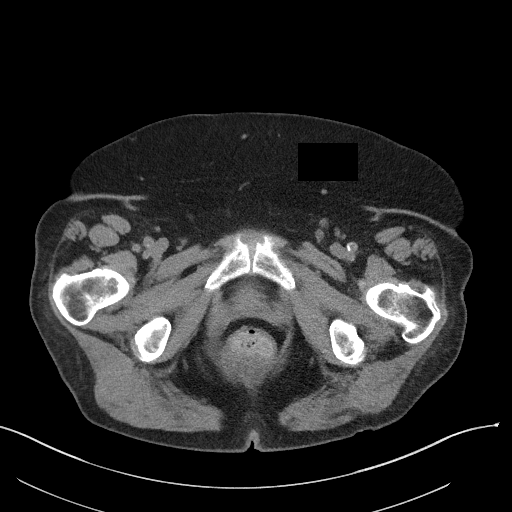
[im 21/96  soft-tissue]
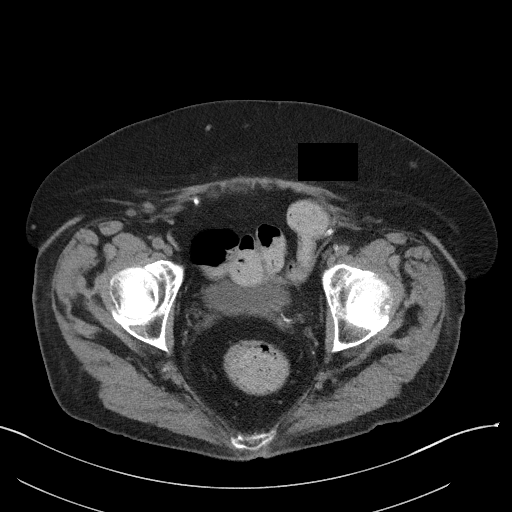
[im 25/96  soft-tissue]
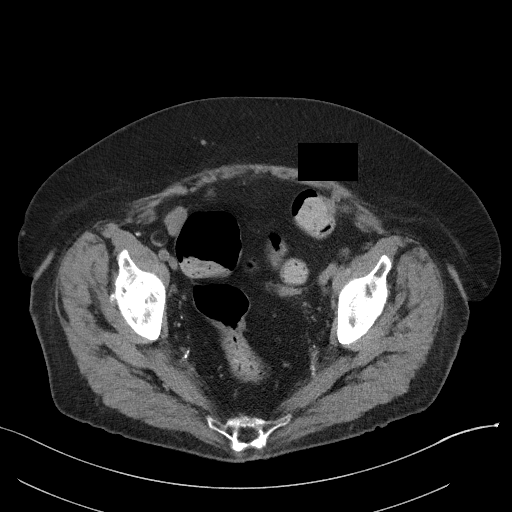
[im 34/96  soft-tissue]
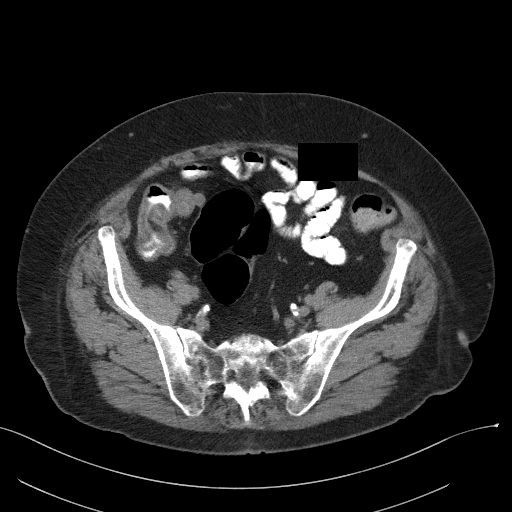
[im 42/96  soft-tissue]
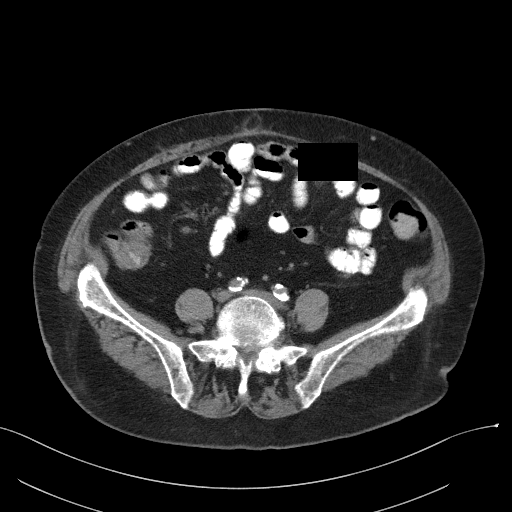
[im 50/96  soft-tissue]
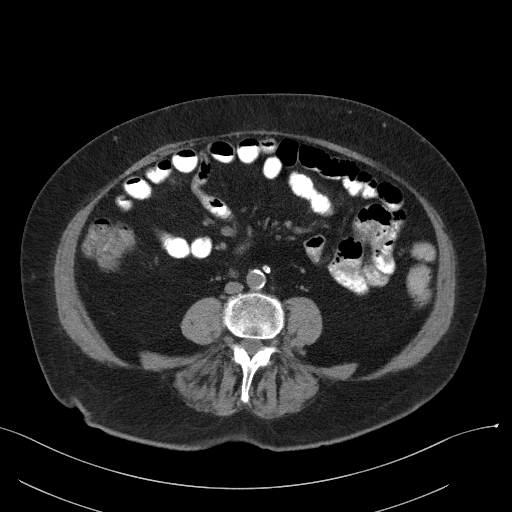
[im 54/96  soft-tissue]
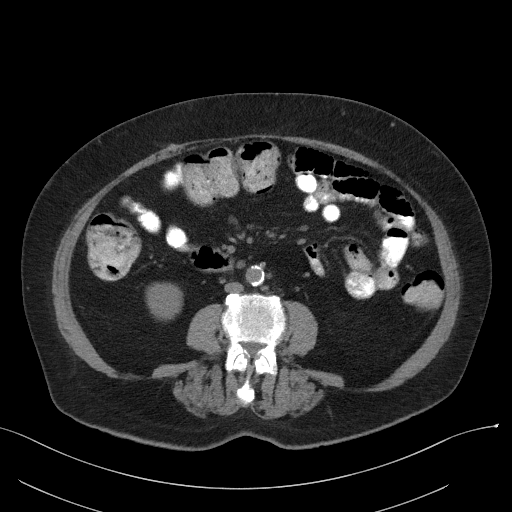
[im 62/96  soft-tissue]
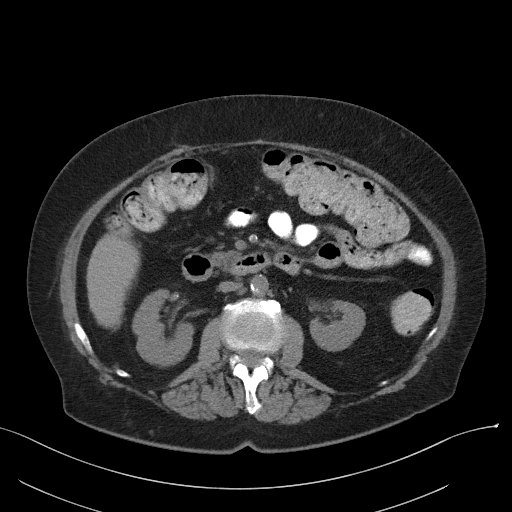
[im 62/96  bone]
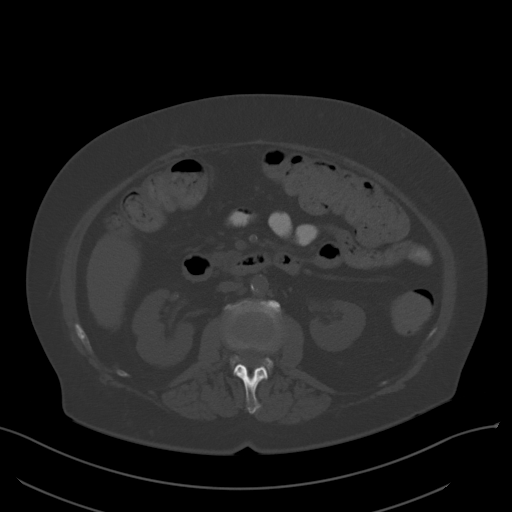
[im 71/96  soft-tissue]
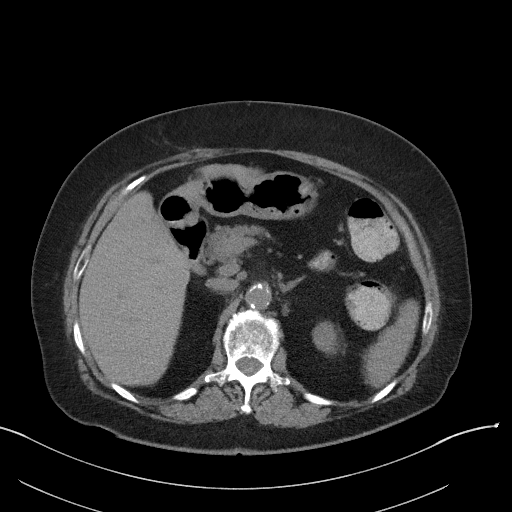
[im 75/96  soft-tissue]
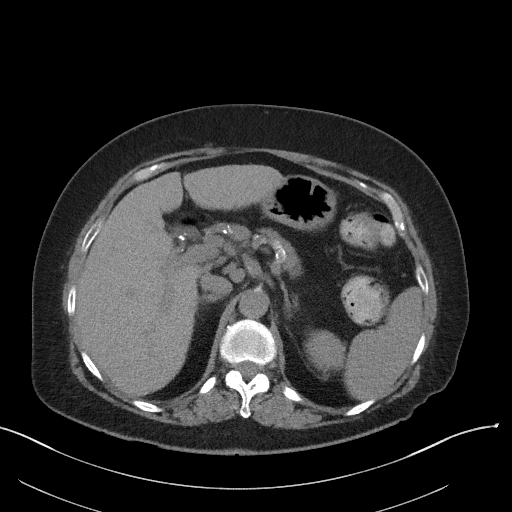
[im 83/96  soft-tissue]
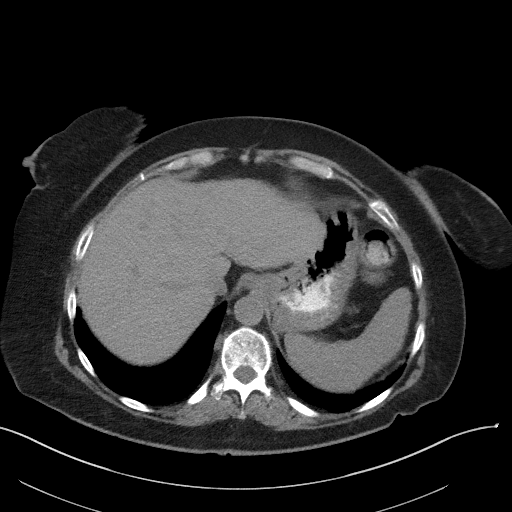
[im 91/96  soft-tissue]
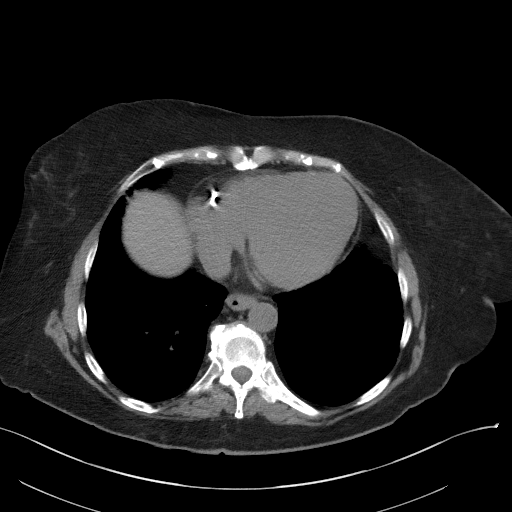

[Series 5: coronal st · coronal · 0.74mm/px · 3 of 98 slices shown]
[im 33/98  soft-tissue]
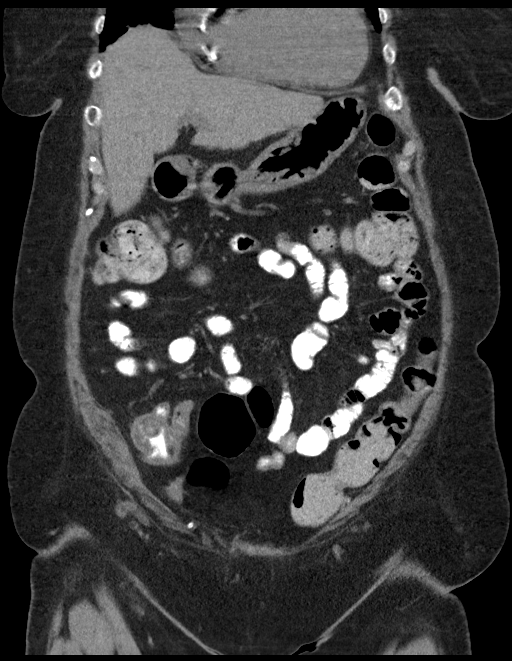
[im 44/98  soft-tissue]
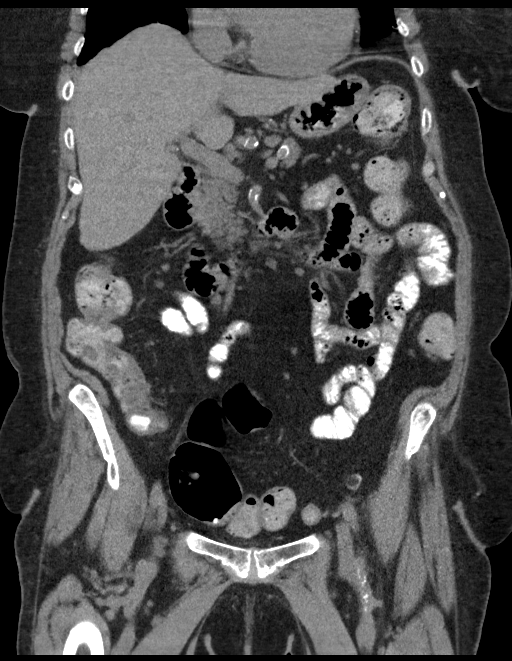
[im 54/98  soft-tissue]
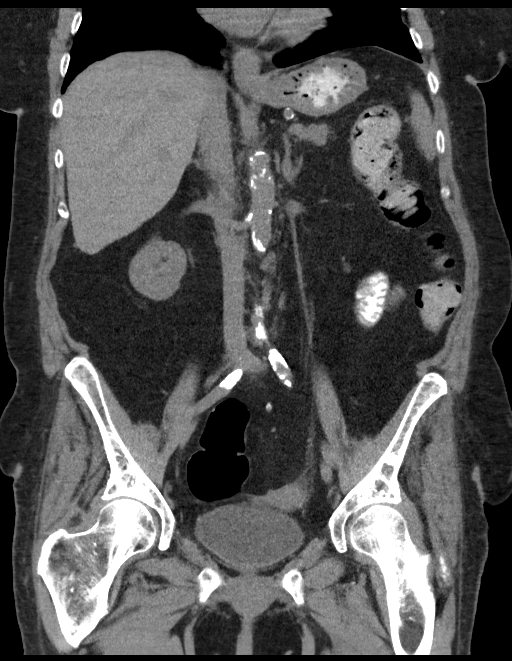

[16 of 46 positions shown; findings below may reference images not displayed]

FINDINGS: Lower chest: Lung bases clear

Hepatobiliary: Gallbladder surgically absent.  Liver unremarkable.

Pancreas: Normal appearance

Spleen: Normal appearance

Adrenals/Urinary Tract: Adrenal glands, kidneys, ureters and bladder
normal appearance

Stomach/Bowel: Bowel wall thickening of cecum. Enlarged appendix
with thickened wall, overall measuring up to 13 mm diameter. No
definite peri appendiceal inflammatory changes. No evidence of
perforation or abscess. Ileocecal valve is patent without small
bowel dilatation or obstruction. Remaining large and small bowel
loops unremarkable. Stomach normal appearance.

Vascular/Lymphatic: Scattered atherosclerotic calcifications aorta,
iliac arteries and visceral branch vessels. Enlarged peripancreatic
lymph node 14 mm short axis image 23. 11 mm portal caval node image
25. Normal and upper normal sized RIGHT cardiophrenic angle lymph
nodes. Additional scattered retroperitoneal nodes without additional
mobile enlargement.

Reproductive: Uterus surgically absent. Nonvisualization of ovaries.

Other: No free air free fluid. Tiny umbilical hernia containing fat.

Musculoskeletal: Bones demineralized.
IMPRESSION: Wall thickening of the cecum with associated enlargement and
thickening of the appendix though no definite periappendiceal
infiltrative changes are identified.

This could represent either very early appendicitis or
colitis/typhlitis with secondary thickening of the appendix.

Coexistent cecal wall thickening makes a mucocele of the appendix
unlikely.

Nonspecific minimally enlarged peripancreatic and portacaval lymph
nodes.

## 2018-08-09 DIAGNOSIS — Z6831 Body mass index (BMI) 31.0-31.9, adult: Secondary | ICD-10-CM | POA: Diagnosis not present

## 2018-08-09 DIAGNOSIS — E782 Mixed hyperlipidemia: Secondary | ICD-10-CM | POA: Diagnosis not present

## 2018-08-09 DIAGNOSIS — Z23 Encounter for immunization: Secondary | ICD-10-CM | POA: Diagnosis not present

## 2018-08-09 DIAGNOSIS — I4891 Unspecified atrial fibrillation: Secondary | ICD-10-CM | POA: Diagnosis not present

## 2018-08-09 DIAGNOSIS — G4700 Insomnia, unspecified: Secondary | ICD-10-CM | POA: Diagnosis not present

## 2018-08-09 DIAGNOSIS — E1140 Type 2 diabetes mellitus with diabetic neuropathy, unspecified: Secondary | ICD-10-CM | POA: Diagnosis not present

## 2018-09-13 DIAGNOSIS — Z6831 Body mass index (BMI) 31.0-31.9, adult: Secondary | ICD-10-CM | POA: Diagnosis not present

## 2018-09-13 DIAGNOSIS — G894 Chronic pain syndrome: Secondary | ICD-10-CM | POA: Diagnosis not present

## 2018-09-13 DIAGNOSIS — E6609 Other obesity due to excess calories: Secondary | ICD-10-CM | POA: Diagnosis not present

## 2018-09-13 DIAGNOSIS — J302 Other seasonal allergic rhinitis: Secondary | ICD-10-CM | POA: Diagnosis not present

## 2018-09-13 DIAGNOSIS — F419 Anxiety disorder, unspecified: Secondary | ICD-10-CM | POA: Diagnosis not present

## 2018-11-03 DIAGNOSIS — Z6830 Body mass index (BMI) 30.0-30.9, adult: Secondary | ICD-10-CM | POA: Diagnosis not present

## 2018-11-03 DIAGNOSIS — E6609 Other obesity due to excess calories: Secondary | ICD-10-CM | POA: Diagnosis not present

## 2018-11-03 DIAGNOSIS — E441 Mild protein-calorie malnutrition: Secondary | ICD-10-CM | POA: Diagnosis not present

## 2018-11-03 DIAGNOSIS — G894 Chronic pain syndrome: Secondary | ICD-10-CM | POA: Diagnosis not present

## 2018-11-03 DIAGNOSIS — F329 Major depressive disorder, single episode, unspecified: Secondary | ICD-10-CM | POA: Diagnosis not present

## 2018-11-03 DIAGNOSIS — Z1389 Encounter for screening for other disorder: Secondary | ICD-10-CM | POA: Diagnosis not present

## 2018-12-01 DIAGNOSIS — E1140 Type 2 diabetes mellitus with diabetic neuropathy, unspecified: Secondary | ICD-10-CM | POA: Diagnosis not present

## 2018-12-01 DIAGNOSIS — I2510 Atherosclerotic heart disease of native coronary artery without angina pectoris: Secondary | ICD-10-CM | POA: Diagnosis not present

## 2018-12-01 DIAGNOSIS — E6609 Other obesity due to excess calories: Secondary | ICD-10-CM | POA: Diagnosis not present

## 2018-12-01 DIAGNOSIS — Z6830 Body mass index (BMI) 30.0-30.9, adult: Secondary | ICD-10-CM | POA: Diagnosis not present

## 2018-12-01 DIAGNOSIS — E039 Hypothyroidism, unspecified: Secondary | ICD-10-CM | POA: Diagnosis not present

## 2018-12-01 DIAGNOSIS — E559 Vitamin D deficiency, unspecified: Secondary | ICD-10-CM | POA: Diagnosis not present

## 2018-12-01 DIAGNOSIS — I509 Heart failure, unspecified: Secondary | ICD-10-CM | POA: Diagnosis not present

## 2018-12-01 DIAGNOSIS — E782 Mixed hyperlipidemia: Secondary | ICD-10-CM | POA: Diagnosis not present

## 2018-12-01 DIAGNOSIS — F419 Anxiety disorder, unspecified: Secondary | ICD-10-CM | POA: Diagnosis not present

## 2018-12-01 DIAGNOSIS — I4891 Unspecified atrial fibrillation: Secondary | ICD-10-CM | POA: Diagnosis not present

## 2019-01-04 DIAGNOSIS — E663 Overweight: Secondary | ICD-10-CM | POA: Diagnosis not present

## 2019-01-04 DIAGNOSIS — G894 Chronic pain syndrome: Secondary | ICD-10-CM | POA: Diagnosis not present

## 2019-01-04 DIAGNOSIS — Z1389 Encounter for screening for other disorder: Secondary | ICD-10-CM | POA: Diagnosis not present

## 2019-01-04 DIAGNOSIS — Z6829 Body mass index (BMI) 29.0-29.9, adult: Secondary | ICD-10-CM | POA: Diagnosis not present

## 2019-01-04 DIAGNOSIS — Z0001 Encounter for general adult medical examination with abnormal findings: Secondary | ICD-10-CM | POA: Diagnosis not present

## 2019-01-06 ENCOUNTER — Other Ambulatory Visit (HOSPITAL_COMMUNITY): Payer: Self-pay | Admitting: Family Medicine

## 2019-01-06 DIAGNOSIS — Z780 Asymptomatic menopausal state: Secondary | ICD-10-CM

## 2019-01-06 DIAGNOSIS — E2839 Other primary ovarian failure: Secondary | ICD-10-CM

## 2019-01-15 ENCOUNTER — Encounter (HOSPITAL_COMMUNITY): Payer: Self-pay | Admitting: Emergency Medicine

## 2019-01-15 ENCOUNTER — Other Ambulatory Visit: Payer: Self-pay

## 2019-01-15 ENCOUNTER — Emergency Department (HOSPITAL_COMMUNITY): Payer: PPO

## 2019-01-15 ENCOUNTER — Emergency Department (HOSPITAL_COMMUNITY)
Admission: EM | Admit: 2019-01-15 | Discharge: 2019-01-15 | Disposition: A | Discharge status: Home / Self Care | Payer: PPO | Attending: Emergency Medicine | Admitting: Emergency Medicine

## 2019-01-15 DIAGNOSIS — I5042 Chronic combined systolic (congestive) and diastolic (congestive) heart failure: Secondary | ICD-10-CM | POA: Diagnosis not present

## 2019-01-15 DIAGNOSIS — Z87891 Personal history of nicotine dependence: Secondary | ICD-10-CM | POA: Insufficient documentation

## 2019-01-15 DIAGNOSIS — Z955 Presence of coronary angioplasty implant and graft: Secondary | ICD-10-CM | POA: Insufficient documentation

## 2019-01-15 DIAGNOSIS — R0602 Shortness of breath: Secondary | ICD-10-CM | POA: Diagnosis not present

## 2019-01-15 DIAGNOSIS — Z9049 Acquired absence of other specified parts of digestive tract: Secondary | ICD-10-CM | POA: Insufficient documentation

## 2019-01-15 DIAGNOSIS — Z853 Personal history of malignant neoplasm of breast: Secondary | ICD-10-CM | POA: Diagnosis not present

## 2019-01-15 DIAGNOSIS — Z79899 Other long term (current) drug therapy: Secondary | ICD-10-CM | POA: Diagnosis not present

## 2019-01-15 DIAGNOSIS — I2510 Atherosclerotic heart disease of native coronary artery without angina pectoris: Secondary | ICD-10-CM | POA: Insufficient documentation

## 2019-01-15 DIAGNOSIS — E876 Hypokalemia: Secondary | ICD-10-CM | POA: Diagnosis not present

## 2019-01-15 DIAGNOSIS — Z7982 Long term (current) use of aspirin: Secondary | ICD-10-CM | POA: Diagnosis not present

## 2019-01-15 DIAGNOSIS — J811 Chronic pulmonary edema: Secondary | ICD-10-CM | POA: Insufficient documentation

## 2019-01-15 DIAGNOSIS — N183 Chronic kidney disease, stage 3 (moderate): Secondary | ICD-10-CM | POA: Insufficient documentation

## 2019-01-15 DIAGNOSIS — Z7902 Long term (current) use of antithrombotics/antiplatelets: Secondary | ICD-10-CM | POA: Insufficient documentation

## 2019-01-15 DIAGNOSIS — F329 Major depressive disorder, single episode, unspecified: Secondary | ICD-10-CM | POA: Insufficient documentation

## 2019-01-15 DIAGNOSIS — I130 Hypertensive heart and chronic kidney disease with heart failure and stage 1 through stage 4 chronic kidney disease, or unspecified chronic kidney disease: Secondary | ICD-10-CM | POA: Insufficient documentation

## 2019-01-15 DIAGNOSIS — F419 Anxiety disorder, unspecified: Secondary | ICD-10-CM | POA: Insufficient documentation

## 2019-01-15 DIAGNOSIS — Z85828 Personal history of other malignant neoplasm of skin: Secondary | ICD-10-CM | POA: Diagnosis not present

## 2019-01-15 DIAGNOSIS — E1122 Type 2 diabetes mellitus with diabetic chronic kidney disease: Secondary | ICD-10-CM | POA: Diagnosis not present

## 2019-01-15 DIAGNOSIS — R0989 Other specified symptoms and signs involving the circulatory and respiratory systems: Secondary | ICD-10-CM

## 2019-01-15 LAB — BASIC METABOLIC PANEL
Anion gap: 10 (ref 5–15)
BUN: 12 mg/dL (ref 8–23)
CO2: 29 mmol/L (ref 22–32)
Calcium: 8.6 mg/dL — ABNORMAL LOW (ref 8.9–10.3)
Chloride: 100 mmol/L (ref 98–111)
Creatinine, Ser: 0.98 mg/dL (ref 0.44–1.00)
GFR calc Af Amer: >60 mL/min (ref >60)
GFR, EST NON AFRICAN AMERICAN: 59 mL/min — AB (ref >60)
Glucose, Bld: 196 mg/dL — ABNORMAL HIGH (ref 70–99)
Potassium: 3 mmol/L — ABNORMAL LOW (ref 3.5–5.1)
Sodium: 139 mmol/L (ref 135–145)

## 2019-01-15 LAB — CBC WITH DIFFERENTIAL/PLATELET
Abs Immature Granulocytes: 0.04 10*3/uL (ref 0.00–0.07)
Basophils Absolute: 0.1 10*3/uL (ref 0.0–0.1)
Basophils Relative: 1 %
Eosinophils Absolute: 0.2 10*3/uL (ref 0.0–0.5)
Eosinophils Relative: 4 %
HCT: 39.3 % (ref 36.0–46.0)
Hemoglobin: 12.3 g/dL (ref 12.0–15.0)
Immature Granulocytes: 1 %
LYMPHS PCT: 13 %
Lymphs Abs: 0.8 10*3/uL (ref 0.7–4.0)
MCH: 28.9 pg (ref 26.0–34.0)
MCHC: 31.3 g/dL (ref 30.0–36.0)
MCV: 92.5 fL (ref 80.0–100.0)
Monocytes Absolute: 0.4 10*3/uL (ref 0.1–1.0)
Monocytes Relative: 5 %
Neutro Abs: 5 10*3/uL (ref 1.7–7.7)
Neutrophils Relative %: 76 %
Platelets: 227 10*3/uL (ref 150–400)
RBC: 4.25 MIL/uL (ref 3.87–5.11)
RDW: 13.4 % (ref 11.5–15.5)
WBC: 6.5 10*3/uL (ref 4.0–10.5)
nRBC: 0 % (ref 0.0–0.2)

## 2019-01-15 MED ORDER — POTASSIUM CHLORIDE CRYS ER 20 MEQ PO TBCR
40.0000 meq | EXTENDED_RELEASE_TABLET | Freq: Once | ORAL | Status: AC
  Administered 2019-01-15: 40 meq via ORAL
  Filled 2019-01-15: qty 2

## 2019-01-15 NOTE — Discharge Instructions (Addendum)
Her shortness of breath should improve by increasing your Lasix doubled the dose to take 2 pills each day.  Also take double your amount of potassium which you are currently taking.  See your doctor next week for checkup.  Stay on a low-salt diet and try to increase the amount of potassium in your food.

## 2019-01-15 NOTE — ED Provider Notes (Signed)
[facility] EMERGENCY DEPARTMENT Provider Note   CSN: [id number] Arrival date & time: 01/15/19  1151    History   Chief Complaint Chief Complaint  Patient presents with  . Cough    HPI Kristen Garner is a 68 y.o. female.     HPI  She presents for evaluation of shortness of breath mostly when lying supine for several days.  She has a mild cough which is nonproductive.  She denies chest pain, nausea, vomiting, weakness or dizziness.  She complains of mild leg swelling.  She is using her usual medications, without relief.  There are no other known modifying factors.  Past Medical History:  Diagnosis Date  . Abnormal chest CT    a. CTA 09/2016: ""1.4 cm aortopulmonary window node seen, of uncertain significance"  . Anxiety   . Arthritis   . Breast cancer ([place])    right  . Breast cancer ([place])   . CAD (coronary artery disease)    a. 09/2016: cath showing 3-v disease with severe stenosis along RCA, CTO of PL branch, mild LAD stenosis, and moderate D1 stenosis. S/p DES x2 to RCA on 10/14/2016.  [place] Kitchen Chronic systolic CHF (congestive heart failure) ([place])    a. 09/2016: Echo showing EF of 20-25% with diffuse HK  . CKD (chronic kidney disease), stage III ([place])   . Depression   . Diabetes mellitus    takes only the pill  . Headache(784.0)    occiasional migraine  . Hyperlipidemia   . Hypertension   . Hypokalemia   . Noncompliance 01/04/2015  . Osteopenia 01/04/2015  . PAT (paroxysmal atrial tachycardia) ([place])   . PVC's (premature ventricular contractions)   . Shingles   . Skin cancer 2015   right leg    Patient Active Problem List   Diagnosis Date Noted  . Chronic appendicitis   . Abdominal pain, epigastric   . Acute cecitis 10/28/2017  . Chronic combined systolic and diastolic CHF (congestive heart failure) ([place]) 10/28/2017  . Acute renal failure superimposed on stage 3 chronic kidney disease ([place]) 10/28/2017  . Colitis 10/26/2017  . AKI (acute kidney injury) ([place])  10/26/2017  . CAD (coronary artery disease) 10/26/2017  . Tachycardia 09/28/2017  . Dyspnea on exertion 09/25/2017  . Atherosclerotic heart disease of native coronary artery with other forms of angina pectoris ([place]) 10/14/2016  . Atrial tachycardia ([place]) 10/14/2016  . Unstable angina ([place])   . Cardiomyopathy, ischemic   . Elevated troponin   . Acute on chronic combined systolic and diastolic CHF (congestive heart failure) (H[facility])     Class: Acute  . SOB (shortness of breath) 10/10/2016  . Unifocal PVCs 10/10/2016  . GERD (gastroesophageal reflux disease) 10/10/2016  . Troponin level elevated 10/10/2016  . Osteopenia 01/04/2015  . Noncompliance 01/04/2015  . UTI (urinary tract infection) 04/05/2013  . Acute renal failure ([place]) 04/04/2013  . Diarrhea 04/04/2013  . Dehydration 04/04/2013  . Hypokalemia 04/04/2013  . Hyponatremia 04/04/2013  . Metabolic acidosis 83/38/2505  . Leucocytosis 04/04/2013  . Hyperglycemia due to type 2 diabetes mellitus ([place]) 04/04/2013  . Essential hypertension 04/04/2013  . Infiltrating ductal carcinoma of breast ([place]) 10/05/2012  . HEARTBURN 08/23/2010  . ANXIETY DEPRESSION 07/15/2010  . H/O atrial tachycardia 07/15/2010    Past Surgical History:  Procedure Laterality Date  . ABDOMINAL HYSTERECTOMY    . BREAST BIOPSY Right    biopsy then wide excision   . CARDIAC CATHETERIZATION N/A 10/13/2016   Procedure: Left Heart Cath and Coronary  Angiography;  Surgeon: Burnell Blanks, MD;  Location: [place] [facility];  Service: Cardiovascular;  Laterality: N/A;  . CARDIAC CATHETERIZATION N/A 10/14/2016   Procedure: Coronary Stent Intervention;  Surgeon: Burnell Blanks, MD;  Location: [place] [facility];  Service: Cardiovascular;  Laterality: N/A;  . CHOLECYSTECTOMY    . CORONARY ANGIOPLASTY WITH STENT PLACEMENT  10/14/2016  . ESOPHAGOGASTRODUODENOSCOPY N/A 10/31/2017   Procedure: ESOPHAGOGASTRODUODENOSCOPY (EGD);  Surgeon: Daneil Dolin, MD;   Location: [facility];  Service: Endoscopy;  Laterality: N/A;  . PARTIAL MASTECTOMY WITH AXILLARY SENTINEL LYMPH NODE BIOPSY    . SENTINEL LYMPH NODE BIOPSY    . SKIN CANCER EXCISION Right 2015     OB History   No obstetric history on file.      Home Medications    Prior to Admission medications   Medication Sig Start Date End Date Taking? Authorizing Provider  albuterol (PROVENTIL HFA;VENTOLIN HFA) 108 (90 Base) MCG/ACT inhaler Inhale 2 puffs into the lungs every 4 (four) hours as needed for wheezing or shortness of breath. 03/28/17  Yes Orpah Greek, MD  aspirin EC 81 MG EC tablet Take 1 tablet (81 mg total) by mouth daily. 10/16/16  Yes Strader, [place] M, PA-C  calcium carbonate (ANTACID) 420 MG CHEW chewable tablet Chew 420 mg daily as needed by mouth for indigestion or heartburn.   Yes [provider]  clopidogrel (PLAVIX) 75 MG tablet Take 1 tablet (75 mg total) daily by mouth. 10/07/17  Yes Memon, Jolaine Artist, MD  escitalopram (LEXAPRO) 10 MG tablet Take 10 mg daily by mouth. 07/16/17  Yes [provider]  furosemide (LASIX) 40 MG tablet Take 1 tablet (40 mg total) daily by mouth. 10/07/17  Yes Memon, Jolaine Artist, MD  HYDROcodone-acetaminophen (NORCO/VICODIN) 5-325 MG tablet Take one tab po q 6 hrs prn pain Patient taking differently: Take 1-2 tablets by mouth every 6 (six) hours as needed for moderate pain. Take one tab po q 6 hrs prn pain 07/12/17  Yes Triplett, Tammy, PA-C  levothyroxine (SYNTHROID, LEVOTHROID) 50 MCG tablet Take 1 tablet by mouth daily. 12/01/18  Yes [provider]  metoprolol succinate (TOPROL-XL) 50 MG 24 hr tablet Take 1 tablet by mouth 2 (two) times daily. 12/01/18  Yes [provider]  nitroGLYCERIN (NITROSTAT) 0.4 MG SL tablet Place 0.4 mg under the tongue every 5 (five) minutes as needed for chest pain.   Yes [provider]  pantoprazole (PROTONIX) 40 MG tablet Take 1 tablet (40 mg total) by mouth daily.  10/31/17  Yes Tat, Shanon Brow, MD  Potassium Chloride ER 20 MEQ TBCR Take 20 mEq by mouth daily. 11/12/16  Yes Deboraha Sprang, MD  rOPINIRole (REQUIP) 1 MG tablet Take 1 mg by mouth 3 (three) times daily.  08/17/17  Yes [provider]  rosuvastatin (CRESTOR) 5 MG tablet Take 1 tablet (5 mg total) daily by mouth. 10/07/17 01/15/19 Yes Memon, Jolaine Artist, MD  sertraline (ZOLOFT) 25 MG tablet Take 1 tablet by mouth daily. 11/03/18  Yes [provider]  Vitamin D, Ergocalciferol, (DRISDOL) 1.25 MG (50000 UT) CAPS capsule Take 1 capsule by mouth once a week. saturdays 12/02/18  Yes [provider]  zolpidem (AMBIEN) 10 MG tablet Take 10 mg by mouth at bedtime.   Yes [provider]  amiodarone (PACERONE) 200 MG tablet Take 1 tablet (200 mg total) 2 (two) times daily by mouth. Patient not taking: Reported on 01/15/2019 10/07/17   Kathie Dike, MD  metoprolol succinate (  TOPROL-XL) 100 MG 24 hr tablet Take 1 tablet (100 mg total) 2 (two) times daily by mouth. Take with or immediately following a meal. Patient not taking: Reported on 01/15/2019 10/07/17   Kathie Dike, MD    Family History Family History  Problem Relation Age of Onset  . Diabetes Father   . Stroke Maternal Grandmother     Social History Social History   Tobacco Use  . Smoking status: Former Research scientist (life sciences)  . Smokeless tobacco: Never Used  Substance Use Topics  . Alcohol use: No  . Drug use: No     Allergies   Adhesive [tape]; Codeine; Penicillins; Sulfonamide derivatives; and Iodine   Review of Systems Review of Systems  All other systems reviewed and are negative.    Physical Exam Updated Vital Signs BP (!) 164/64 (BP Location: Right Arm)   Pulse 86   Temp 98 F (36.7 C) (Oral)   Resp 18   Ht 5\' 6"  (1.676 m)   Wt 80.3 kg   SpO2 95%   BMI 28.57 kg/m   Physical Exam Vitals signs and nursing note reviewed.  Constitutional:      General: She is not in acute distress.    Appearance:  She is well-developed. She is obese. She is not ill-appearing, toxic-appearing or diaphoretic.  HENT:     Head: Normocephalic and atraumatic.  Eyes:     Conjunctiva/sclera: Conjunctivae normal.     Pupils: Pupils are equal, round, and reactive to light.  Neck:     Musculoskeletal: Normal range of motion and neck supple.     Trachea: Phonation normal.  Cardiovascular:     Rate and Rhythm: Normal rate and regular rhythm.  Pulmonary:     Effort: Pulmonary effort is normal.     Comments: Scattered rhonchi.  No wheezes rales or increased work of breathing. Chest:     Chest wall: No tenderness.  Abdominal:     General: There is no distension.     Palpations: Abdomen is soft.     Tenderness: There is no abdominal tenderness. There is no guarding.  Musculoskeletal: Normal range of motion.     Right lower leg: Edema present.     Left lower leg: Edema present.     Comments: 1+ lower leg edema bilaterally.  Skin:    General: Skin is warm and dry.  Neurological:     Mental Status: She is alert and oriented to person, place, and time.     Motor: No abnormal muscle tone.  Psychiatric:        Mood and Affect: Mood normal.        Behavior: Behavior normal.        Thought Content: Thought content normal.        Judgment: Judgment normal.      ED Treatments / Results  Labs (all labs ordered are listed, but only abnormal results are displayed) Labs Reviewed  BASIC METABOLIC PANEL - Abnormal; Notable for the following components:      Result Value   Potassium 3.0 (*)    Glucose, Bld 196 (*)    Calcium 8.6 (*)    GFR calc non Af Amer 59 (*)    All other components within normal limits  CBC WITH DIFFERENTIAL/PLATELET    EKG None  Radiology Dg Chest 2 View  Result Date: 01/15/2019 CLINICAL DATA:  Shortness of breath. EXAM: CHEST - 2 VIEW COMPARISON:  10/04/2017 FINDINGS: Mild hyperexpansion. The cardio pericardial silhouette is enlarged. There is  pulmonary vascular congestion  without overt pulmonary edema. Interstitial markings are diffusely coarsened with chronic features. Similar appearance of bibasilar chronic atelectasis or scarring. Tiny bilateral pleural effusions evident. The visualized bony structures of the thorax are intact. IMPRESSION: Cardiomegaly with vascular congestion and tiny effusions. Underlying chronic interstitial lung disease with basilar chronic atelectasis or scarring. Electronically Signed   By: Misty Stanley M.D.   On: 01/15/2019 13:15    Procedures Procedures (including critical care time)  Medications Ordered in ED Medications  potassium chloride SA (K-DUR,KLOR-CON) CR tablet 40 mEq (40 mEq Oral Given 01/15/19 1455)     Initial Impression / Assessment and Plan / ED Course  I have reviewed the triage vital signs and the nursing notes.  Pertinent labs & imaging results that were available during my care of the patient were reviewed by me and considered in my medical decision making (see chart for details).  Clinical Course as of Jan 15 1457  Sat Jan 15, 2019  1319 Weight today is 82.3 kilograms, up 4 kg from 12/01/2017.  No intervening weights are documented in the chart.   [EW]  1319 Vascular congestion present, no infiltrate.  Small effusions are present.  Images reviewed by me  DG Chest 2 View [EW]  1444 Normal except potassium low, glucose high, calcium low, GFR low  Basic metabolic panel(!) [EW]  5093 Normal  CBC with Differential [EW]    Clinical Course User Index [EW] Daleen Bo, MD        Patient Vitals for the past 24 hrs:  BP Temp Temp src Pulse Resp SpO2 Height Weight  01/15/19 1158 - - - - - 95 % - -  01/15/19 1156 (!) 164/64 98 F (36.7 C) Oral 86 18 93 % - -  01/15/19 1155 - - - - - - 5\' 6"  (1.676 m) 80.3 kg    2:03 PM Reevaluation with update and discussion. After initial assessment and treatment, an updated evaluation reveals no change in clinical status.  Findings discussed with patient all questions  answered. Daleen Bo   Medical Decision Making: Shortness of breath, with vascular congestion on imaging.  Patient with history of CHF.  Potassium low today.  Patient needs increased diuresis, to control symptoms.  Plan to double Lasix and potassium dosing, with close follow-up by PCP or cardiology.  Doubt ACS, PE or pneumonia.  CRITICAL CARE-no Performed by: Daleen Bo  Nursing Notes Reviewed/ Care Coordinated Applicable Imaging Reviewed Interpretation of Laboratory Data incorporated into ED treatment  The patient appears reasonably screened and/or stabilized for discharge and I doubt any other medical condition or other [facility] requiring further screening, evaluation, or treatment in the ED at this time prior to discharge.  Plan: Home Medications-increase potassium and Lasix Lasix, continue usual medications; Home Treatments-rest, fluids, low-salt diet; return here if the recommended treatment, does not improve the symptoms; Recommended follow up-PCP checkup 5 to 7 days.  Final Clinical Impressions(s) / ED Diagnoses   Final diagnoses:  Pulmonary vascular congestion  Hypokalemia    ED Discharge Orders    None       Daleen Bo, MD 01/15/19 [phone]

## 2019-01-15 NOTE — ED Triage Notes (Addendum)
Pt reports increased WOB for the past 3 days, denies COPD Hx. Non productive cough, generalized body aches. Denies fever.

## 2019-01-24 ENCOUNTER — Encounter: Payer: Self-pay | Admitting: Nurse Practitioner

## 2019-02-23 DIAGNOSIS — E669 Obesity, unspecified: Secondary | ICD-10-CM | POA: Diagnosis not present

## 2019-02-23 DIAGNOSIS — J310 Chronic rhinitis: Secondary | ICD-10-CM | POA: Diagnosis not present

## 2019-02-23 DIAGNOSIS — F419 Anxiety disorder, unspecified: Secondary | ICD-10-CM | POA: Diagnosis not present

## 2019-02-23 DIAGNOSIS — M9903 Segmental and somatic dysfunction of lumbar region: Secondary | ICD-10-CM | POA: Diagnosis not present

## 2019-02-23 DIAGNOSIS — I509 Heart failure, unspecified: Secondary | ICD-10-CM | POA: Diagnosis not present

## 2019-02-23 DIAGNOSIS — R0602 Shortness of breath: Secondary | ICD-10-CM | POA: Diagnosis not present

## 2019-02-23 DIAGNOSIS — E782 Mixed hyperlipidemia: Secondary | ICD-10-CM | POA: Diagnosis not present

## 2019-02-23 DIAGNOSIS — Z6830 Body mass index (BMI) 30.0-30.9, adult: Secondary | ICD-10-CM | POA: Diagnosis not present

## 2019-02-23 DIAGNOSIS — Z0000 Encounter for general adult medical examination without abnormal findings: Secondary | ICD-10-CM | POA: Diagnosis not present

## 2019-02-23 DIAGNOSIS — N6099 Unspecified benign mammary dysplasia of unspecified breast: Secondary | ICD-10-CM | POA: Diagnosis not present

## 2019-02-23 DIAGNOSIS — M9901 Segmental and somatic dysfunction of cervical region: Secondary | ICD-10-CM | POA: Diagnosis not present

## 2019-02-23 DIAGNOSIS — G894 Chronic pain syndrome: Secondary | ICD-10-CM | POA: Diagnosis not present

## 2019-02-23 DIAGNOSIS — G4733 Obstructive sleep apnea (adult) (pediatric): Secondary | ICD-10-CM | POA: Diagnosis not present

## 2019-02-23 DIAGNOSIS — K2270 Barrett's esophagus without dysplasia: Secondary | ICD-10-CM | POA: Diagnosis not present

## 2019-02-23 DIAGNOSIS — I10 Essential (primary) hypertension: Secondary | ICD-10-CM | POA: Diagnosis not present

## 2019-02-23 DIAGNOSIS — Z4789 Encounter for other orthopedic aftercare: Secondary | ICD-10-CM | POA: Diagnosis not present

## 2019-02-23 DIAGNOSIS — E6609 Other obesity due to excess calories: Secondary | ICD-10-CM | POA: Diagnosis not present

## 2019-02-23 DIAGNOSIS — M4723 Other spondylosis with radiculopathy, cervicothoracic region: Secondary | ICD-10-CM | POA: Diagnosis not present

## 2019-02-23 DIAGNOSIS — M546 Pain in thoracic spine: Secondary | ICD-10-CM | POA: Diagnosis not present

## 2019-02-23 DIAGNOSIS — R7303 Prediabetes: Secondary | ICD-10-CM | POA: Diagnosis not present

## 2019-02-23 DIAGNOSIS — R410 Disorientation, unspecified: Secondary | ICD-10-CM | POA: Diagnosis not present

## 2019-02-23 DIAGNOSIS — E876 Hypokalemia: Secondary | ICD-10-CM | POA: Diagnosis not present

## 2019-02-23 DIAGNOSIS — I4891 Unspecified atrial fibrillation: Secondary | ICD-10-CM | POA: Diagnosis not present

## 2019-02-23 DIAGNOSIS — M9902 Segmental and somatic dysfunction of thoracic region: Secondary | ICD-10-CM | POA: Diagnosis not present

## 2019-02-23 DIAGNOSIS — I77810 Thoracic aortic ectasia: Secondary | ICD-10-CM | POA: Diagnosis not present

## 2019-03-30 ENCOUNTER — Ambulatory Visit: Payer: PPO | Admitting: Nurse Practitioner

## 2019-03-30 ENCOUNTER — Encounter: Payer: Self-pay | Admitting: Internal Medicine

## 2019-03-30 ENCOUNTER — Telehealth: Payer: Self-pay | Admitting: Internal Medicine

## 2019-03-30 ENCOUNTER — Other Ambulatory Visit: Payer: Self-pay

## 2019-03-30 NOTE — Telephone Encounter (Signed)
PATIENT WAS A NO SHOW/NO ANSWER AND LETTER SENT  °

## 2019-03-30 NOTE — Progress Notes (Signed)
No Show

## 2019-04-06 DIAGNOSIS — E663 Overweight: Secondary | ICD-10-CM | POA: Diagnosis not present

## 2019-04-06 DIAGNOSIS — I509 Heart failure, unspecified: Secondary | ICD-10-CM | POA: Diagnosis not present

## 2019-04-06 DIAGNOSIS — F419 Anxiety disorder, unspecified: Secondary | ICD-10-CM | POA: Diagnosis not present

## 2019-04-06 DIAGNOSIS — E1140 Type 2 diabetes mellitus with diabetic neuropathy, unspecified: Secondary | ICD-10-CM | POA: Diagnosis not present

## 2019-04-06 DIAGNOSIS — I4891 Unspecified atrial fibrillation: Secondary | ICD-10-CM | POA: Diagnosis not present

## 2019-04-06 DIAGNOSIS — Z23 Encounter for immunization: Secondary | ICD-10-CM | POA: Diagnosis not present

## 2019-04-06 DIAGNOSIS — E119 Type 2 diabetes mellitus without complications: Secondary | ICD-10-CM | POA: Diagnosis not present

## 2019-04-06 DIAGNOSIS — M1611 Unilateral primary osteoarthritis, right hip: Secondary | ICD-10-CM | POA: Diagnosis not present

## 2019-04-06 DIAGNOSIS — Z6829 Body mass index (BMI) 29.0-29.9, adult: Secondary | ICD-10-CM | POA: Diagnosis not present

## 2019-05-09 DIAGNOSIS — E663 Overweight: Secondary | ICD-10-CM | POA: Diagnosis not present

## 2019-05-09 DIAGNOSIS — F419 Anxiety disorder, unspecified: Secondary | ICD-10-CM | POA: Diagnosis not present

## 2019-05-09 DIAGNOSIS — Z1389 Encounter for screening for other disorder: Secondary | ICD-10-CM | POA: Diagnosis not present

## 2019-05-09 DIAGNOSIS — I4891 Unspecified atrial fibrillation: Secondary | ICD-10-CM | POA: Diagnosis not present

## 2019-05-09 DIAGNOSIS — E039 Hypothyroidism, unspecified: Secondary | ICD-10-CM | POA: Diagnosis not present

## 2019-05-09 DIAGNOSIS — E1140 Type 2 diabetes mellitus with diabetic neuropathy, unspecified: Secondary | ICD-10-CM | POA: Diagnosis not present

## 2019-05-09 DIAGNOSIS — G43909 Migraine, unspecified, not intractable, without status migrainosus: Secondary | ICD-10-CM | POA: Diagnosis not present

## 2019-05-09 DIAGNOSIS — Z6829 Body mass index (BMI) 29.0-29.9, adult: Secondary | ICD-10-CM | POA: Diagnosis not present

## 2019-06-07 DIAGNOSIS — J302 Other seasonal allergic rhinitis: Secondary | ICD-10-CM | POA: Diagnosis not present

## 2019-06-07 DIAGNOSIS — E663 Overweight: Secondary | ICD-10-CM | POA: Diagnosis not present

## 2019-06-07 DIAGNOSIS — G894 Chronic pain syndrome: Secondary | ICD-10-CM | POA: Diagnosis not present

## 2019-06-07 DIAGNOSIS — Z6829 Body mass index (BMI) 29.0-29.9, adult: Secondary | ICD-10-CM | POA: Diagnosis not present

## 2019-06-08 ENCOUNTER — Other Ambulatory Visit (HOSPITAL_COMMUNITY): Payer: Self-pay | Admitting: Family Medicine

## 2019-06-08 DIAGNOSIS — R928 Other abnormal and inconclusive findings on diagnostic imaging of breast: Secondary | ICD-10-CM

## 2019-06-22 ENCOUNTER — Other Ambulatory Visit (HOSPITAL_COMMUNITY): Payer: PPO

## 2019-07-11 DIAGNOSIS — J302 Other seasonal allergic rhinitis: Secondary | ICD-10-CM | POA: Diagnosis not present

## 2019-07-11 DIAGNOSIS — E663 Overweight: Secondary | ICD-10-CM | POA: Diagnosis not present

## 2019-07-11 DIAGNOSIS — E1165 Type 2 diabetes mellitus with hyperglycemia: Secondary | ICD-10-CM | POA: Diagnosis not present

## 2019-07-11 DIAGNOSIS — Z6829 Body mass index (BMI) 29.0-29.9, adult: Secondary | ICD-10-CM | POA: Diagnosis not present

## 2019-07-13 ENCOUNTER — Other Ambulatory Visit (HOSPITAL_COMMUNITY): Payer: Self-pay | Admitting: Family Medicine

## 2019-07-13 DIAGNOSIS — Z780 Asymptomatic menopausal state: Secondary | ICD-10-CM

## 2019-07-13 DIAGNOSIS — Z1231 Encounter for screening mammogram for malignant neoplasm of breast: Secondary | ICD-10-CM

## 2019-08-11 DIAGNOSIS — E663 Overweight: Secondary | ICD-10-CM | POA: Diagnosis not present

## 2019-08-11 DIAGNOSIS — Z6829 Body mass index (BMI) 29.0-29.9, adult: Secondary | ICD-10-CM | POA: Diagnosis not present

## 2019-08-11 DIAGNOSIS — G894 Chronic pain syndrome: Secondary | ICD-10-CM | POA: Diagnosis not present

## 2019-08-24 DIAGNOSIS — E663 Overweight: Secondary | ICD-10-CM | POA: Diagnosis not present

## 2019-08-24 DIAGNOSIS — N183 Chronic kidney disease, stage 3 (moderate): Secondary | ICD-10-CM | POA: Diagnosis not present

## 2019-08-24 DIAGNOSIS — I10 Essential (primary) hypertension: Secondary | ICD-10-CM | POA: Diagnosis not present

## 2019-08-24 DIAGNOSIS — E118 Type 2 diabetes mellitus with unspecified complications: Secondary | ICD-10-CM | POA: Diagnosis not present

## 2019-09-20 DIAGNOSIS — F419 Anxiety disorder, unspecified: Secondary | ICD-10-CM | POA: Diagnosis not present

## 2019-09-20 DIAGNOSIS — G894 Chronic pain syndrome: Secondary | ICD-10-CM | POA: Diagnosis not present

## 2019-11-13 DIAGNOSIS — E785 Hyperlipidemia, unspecified: Secondary | ICD-10-CM | POA: Insufficient documentation

## 2020-02-06 DIAGNOSIS — Z853 Personal history of malignant neoplasm of breast: Secondary | ICD-10-CM | POA: Insufficient documentation

## 2021-01-16 DIAGNOSIS — Z9889 Other specified postprocedural states: Secondary | ICD-10-CM | POA: Insufficient documentation

## 2021-05-28 DIAGNOSIS — N1832 Chronic kidney disease, stage 3b: Secondary | ICD-10-CM | POA: Diagnosis not present

## 2021-05-28 DIAGNOSIS — G8929 Other chronic pain: Secondary | ICD-10-CM | POA: Diagnosis not present

## 2021-05-28 DIAGNOSIS — N3000 Acute cystitis without hematuria: Secondary | ICD-10-CM | POA: Diagnosis not present

## 2021-05-28 DIAGNOSIS — E1165 Type 2 diabetes mellitus with hyperglycemia: Secondary | ICD-10-CM | POA: Diagnosis not present

## 2021-05-28 DIAGNOSIS — R296 Repeated falls: Secondary | ICD-10-CM | POA: Diagnosis not present

## 2021-05-28 DIAGNOSIS — I5020 Unspecified systolic (congestive) heart failure: Secondary | ICD-10-CM | POA: Diagnosis not present

## 2021-05-28 DIAGNOSIS — I130 Hypertensive heart and chronic kidney disease with heart failure and stage 1 through stage 4 chronic kidney disease, or unspecified chronic kidney disease: Secondary | ICD-10-CM | POA: Diagnosis not present

## 2021-05-28 DIAGNOSIS — I4819 Other persistent atrial fibrillation: Secondary | ICD-10-CM | POA: Diagnosis not present

## 2021-05-28 DIAGNOSIS — M545 Low back pain, unspecified: Secondary | ICD-10-CM | POA: Diagnosis not present

## 2021-05-28 DIAGNOSIS — I2510 Atherosclerotic heart disease of native coronary artery without angina pectoris: Secondary | ICD-10-CM | POA: Diagnosis not present

## 2021-05-28 DIAGNOSIS — E1122 Type 2 diabetes mellitus with diabetic chronic kidney disease: Secondary | ICD-10-CM | POA: Diagnosis not present

## 2021-05-28 DIAGNOSIS — E785 Hyperlipidemia, unspecified: Secondary | ICD-10-CM | POA: Diagnosis not present

## 2021-05-29 DIAGNOSIS — R0981 Nasal congestion: Secondary | ICD-10-CM | POA: Diagnosis not present

## 2021-05-29 DIAGNOSIS — I10 Essential (primary) hypertension: Secondary | ICD-10-CM | POA: Diagnosis not present

## 2021-06-03 DIAGNOSIS — I130 Hypertensive heart and chronic kidney disease with heart failure and stage 1 through stage 4 chronic kidney disease, or unspecified chronic kidney disease: Secondary | ICD-10-CM | POA: Diagnosis not present

## 2021-06-03 DIAGNOSIS — G8929 Other chronic pain: Secondary | ICD-10-CM | POA: Diagnosis not present

## 2021-06-03 DIAGNOSIS — I2510 Atherosclerotic heart disease of native coronary artery without angina pectoris: Secondary | ICD-10-CM | POA: Diagnosis not present

## 2021-06-03 DIAGNOSIS — E1165 Type 2 diabetes mellitus with hyperglycemia: Secondary | ICD-10-CM | POA: Diagnosis not present

## 2021-06-03 DIAGNOSIS — M545 Low back pain, unspecified: Secondary | ICD-10-CM | POA: Diagnosis not present

## 2021-06-03 DIAGNOSIS — R296 Repeated falls: Secondary | ICD-10-CM | POA: Diagnosis not present

## 2021-06-03 DIAGNOSIS — I5020 Unspecified systolic (congestive) heart failure: Secondary | ICD-10-CM | POA: Diagnosis not present

## 2021-06-03 DIAGNOSIS — N3000 Acute cystitis without hematuria: Secondary | ICD-10-CM | POA: Diagnosis not present

## 2021-06-03 DIAGNOSIS — I4819 Other persistent atrial fibrillation: Secondary | ICD-10-CM | POA: Diagnosis not present

## 2021-06-03 DIAGNOSIS — E785 Hyperlipidemia, unspecified: Secondary | ICD-10-CM | POA: Diagnosis not present

## 2021-06-03 DIAGNOSIS — N1832 Chronic kidney disease, stage 3b: Secondary | ICD-10-CM | POA: Diagnosis not present

## 2021-06-03 DIAGNOSIS — E1122 Type 2 diabetes mellitus with diabetic chronic kidney disease: Secondary | ICD-10-CM | POA: Diagnosis not present

## 2021-06-11 DIAGNOSIS — E1165 Type 2 diabetes mellitus with hyperglycemia: Secondary | ICD-10-CM | POA: Diagnosis not present

## 2021-06-11 DIAGNOSIS — N3000 Acute cystitis without hematuria: Secondary | ICD-10-CM | POA: Diagnosis not present

## 2021-06-11 DIAGNOSIS — I5020 Unspecified systolic (congestive) heart failure: Secondary | ICD-10-CM | POA: Diagnosis not present

## 2021-06-11 DIAGNOSIS — M545 Low back pain, unspecified: Secondary | ICD-10-CM | POA: Diagnosis not present

## 2021-06-11 DIAGNOSIS — E785 Hyperlipidemia, unspecified: Secondary | ICD-10-CM | POA: Diagnosis not present

## 2021-06-11 DIAGNOSIS — R296 Repeated falls: Secondary | ICD-10-CM | POA: Diagnosis not present

## 2021-06-11 DIAGNOSIS — I2510 Atherosclerotic heart disease of native coronary artery without angina pectoris: Secondary | ICD-10-CM | POA: Diagnosis not present

## 2021-06-11 DIAGNOSIS — G8929 Other chronic pain: Secondary | ICD-10-CM | POA: Diagnosis not present

## 2021-06-11 DIAGNOSIS — I4819 Other persistent atrial fibrillation: Secondary | ICD-10-CM | POA: Diagnosis not present

## 2021-06-11 DIAGNOSIS — I130 Hypertensive heart and chronic kidney disease with heart failure and stage 1 through stage 4 chronic kidney disease, or unspecified chronic kidney disease: Secondary | ICD-10-CM | POA: Diagnosis not present

## 2021-06-11 DIAGNOSIS — N1832 Chronic kidney disease, stage 3b: Secondary | ICD-10-CM | POA: Diagnosis not present

## 2021-06-11 DIAGNOSIS — E1122 Type 2 diabetes mellitus with diabetic chronic kidney disease: Secondary | ICD-10-CM | POA: Diagnosis not present

## 2021-06-13 DIAGNOSIS — E1122 Type 2 diabetes mellitus with diabetic chronic kidney disease: Secondary | ICD-10-CM | POA: Diagnosis not present

## 2021-06-13 DIAGNOSIS — E1165 Type 2 diabetes mellitus with hyperglycemia: Secondary | ICD-10-CM | POA: Diagnosis not present

## 2021-06-13 DIAGNOSIS — G8929 Other chronic pain: Secondary | ICD-10-CM | POA: Diagnosis not present

## 2021-06-13 DIAGNOSIS — I2510 Atherosclerotic heart disease of native coronary artery without angina pectoris: Secondary | ICD-10-CM | POA: Diagnosis not present

## 2021-06-13 DIAGNOSIS — R296 Repeated falls: Secondary | ICD-10-CM | POA: Diagnosis not present

## 2021-06-13 DIAGNOSIS — M545 Low back pain, unspecified: Secondary | ICD-10-CM | POA: Diagnosis not present

## 2021-06-13 DIAGNOSIS — I5020 Unspecified systolic (congestive) heart failure: Secondary | ICD-10-CM | POA: Diagnosis not present

## 2021-06-13 DIAGNOSIS — N1832 Chronic kidney disease, stage 3b: Secondary | ICD-10-CM | POA: Diagnosis not present

## 2021-06-13 DIAGNOSIS — E785 Hyperlipidemia, unspecified: Secondary | ICD-10-CM | POA: Diagnosis not present

## 2021-06-13 DIAGNOSIS — N3000 Acute cystitis without hematuria: Secondary | ICD-10-CM | POA: Diagnosis not present

## 2021-06-13 DIAGNOSIS — I4819 Other persistent atrial fibrillation: Secondary | ICD-10-CM | POA: Diagnosis not present

## 2021-06-13 DIAGNOSIS — I130 Hypertensive heart and chronic kidney disease with heart failure and stage 1 through stage 4 chronic kidney disease, or unspecified chronic kidney disease: Secondary | ICD-10-CM | POA: Diagnosis not present

## 2021-06-14 DIAGNOSIS — E1122 Type 2 diabetes mellitus with diabetic chronic kidney disease: Secondary | ICD-10-CM | POA: Diagnosis not present

## 2021-06-14 DIAGNOSIS — Z7901 Long term (current) use of anticoagulants: Secondary | ICD-10-CM | POA: Diagnosis not present

## 2021-06-14 DIAGNOSIS — M25431 Effusion, right wrist: Secondary | ICD-10-CM | POA: Diagnosis not present

## 2021-06-14 DIAGNOSIS — I2510 Atherosclerotic heart disease of native coronary artery without angina pectoris: Secondary | ICD-10-CM | POA: Diagnosis not present

## 2021-06-14 DIAGNOSIS — S52501A Unspecified fracture of the lower end of right radius, initial encounter for closed fracture: Secondary | ICD-10-CM | POA: Diagnosis not present

## 2021-06-14 DIAGNOSIS — Z950 Presence of cardiac pacemaker: Secondary | ICD-10-CM | POA: Diagnosis not present

## 2021-06-14 DIAGNOSIS — Z880 Allergy status to penicillin: Secondary | ICD-10-CM | POA: Diagnosis not present

## 2021-06-14 DIAGNOSIS — I509 Heart failure, unspecified: Secondary | ICD-10-CM | POA: Diagnosis not present

## 2021-06-14 DIAGNOSIS — I130 Hypertensive heart and chronic kidney disease with heart failure and stage 1 through stage 4 chronic kidney disease, or unspecified chronic kidney disease: Secondary | ICD-10-CM | POA: Diagnosis not present

## 2021-06-14 DIAGNOSIS — S52611A Displaced fracture of right ulna styloid process, initial encounter for closed fracture: Secondary | ICD-10-CM | POA: Diagnosis not present

## 2021-06-14 DIAGNOSIS — I4811 Longstanding persistent atrial fibrillation: Secondary | ICD-10-CM | POA: Diagnosis not present

## 2021-06-14 DIAGNOSIS — Z9861 Coronary angioplasty status: Secondary | ICD-10-CM | POA: Diagnosis not present

## 2021-06-14 DIAGNOSIS — Z888 Allergy status to other drugs, medicaments and biological substances status: Secondary | ICD-10-CM | POA: Diagnosis not present

## 2021-06-14 DIAGNOSIS — N178 Other acute kidney failure: Secondary | ICD-10-CM | POA: Diagnosis not present

## 2021-06-14 DIAGNOSIS — M25531 Pain in right wrist: Secondary | ICD-10-CM | POA: Diagnosis not present

## 2021-06-14 DIAGNOSIS — J45909 Unspecified asthma, uncomplicated: Secondary | ICD-10-CM | POA: Diagnosis not present

## 2021-06-14 DIAGNOSIS — Z87891 Personal history of nicotine dependence: Secondary | ICD-10-CM | POA: Diagnosis not present

## 2021-06-14 DIAGNOSIS — S52571A Other intraarticular fracture of lower end of right radius, initial encounter for closed fracture: Secondary | ICD-10-CM | POA: Diagnosis not present

## 2021-06-14 DIAGNOSIS — S52601A Unspecified fracture of lower end of right ulna, initial encounter for closed fracture: Secondary | ICD-10-CM | POA: Diagnosis not present

## 2021-06-14 DIAGNOSIS — S80211A Abrasion, right knee, initial encounter: Secondary | ICD-10-CM | POA: Diagnosis not present

## 2021-06-14 DIAGNOSIS — Z886 Allergy status to analgesic agent status: Secondary | ICD-10-CM | POA: Diagnosis not present

## 2021-06-14 DIAGNOSIS — Z853 Personal history of malignant neoplasm of breast: Secondary | ICD-10-CM | POA: Diagnosis not present

## 2021-06-14 DIAGNOSIS — N184 Chronic kidney disease, stage 4 (severe): Secondary | ICD-10-CM | POA: Diagnosis not present

## 2021-06-14 DIAGNOSIS — Z79899 Other long term (current) drug therapy: Secondary | ICD-10-CM | POA: Diagnosis not present

## 2021-06-21 DIAGNOSIS — I2510 Atherosclerotic heart disease of native coronary artery without angina pectoris: Secondary | ICD-10-CM | POA: Diagnosis not present

## 2021-06-21 DIAGNOSIS — Z87891 Personal history of nicotine dependence: Secondary | ICD-10-CM | POA: Diagnosis not present

## 2021-06-21 DIAGNOSIS — N179 Acute kidney failure, unspecified: Secondary | ICD-10-CM | POA: Diagnosis not present

## 2021-06-21 DIAGNOSIS — N184 Chronic kidney disease, stage 4 (severe): Secondary | ICD-10-CM | POA: Diagnosis not present

## 2021-06-21 DIAGNOSIS — M25531 Pain in right wrist: Secondary | ICD-10-CM | POA: Diagnosis not present

## 2021-06-21 DIAGNOSIS — Z955 Presence of coronary angioplasty implant and graft: Secondary | ICD-10-CM | POA: Diagnosis not present

## 2021-06-21 DIAGNOSIS — Z886 Allergy status to analgesic agent status: Secondary | ICD-10-CM | POA: Diagnosis not present

## 2021-06-21 DIAGNOSIS — I130 Hypertensive heart and chronic kidney disease with heart failure and stage 1 through stage 4 chronic kidney disease, or unspecified chronic kidney disease: Secondary | ICD-10-CM | POA: Diagnosis not present

## 2021-06-21 DIAGNOSIS — Z950 Presence of cardiac pacemaker: Secondary | ICD-10-CM | POA: Diagnosis not present

## 2021-06-21 DIAGNOSIS — Z79899 Other long term (current) drug therapy: Secondary | ICD-10-CM | POA: Diagnosis not present

## 2021-06-21 DIAGNOSIS — Z853 Personal history of malignant neoplasm of breast: Secondary | ICD-10-CM | POA: Diagnosis not present

## 2021-06-21 DIAGNOSIS — I4811 Longstanding persistent atrial fibrillation: Secondary | ICD-10-CM | POA: Diagnosis not present

## 2021-06-21 DIAGNOSIS — J45909 Unspecified asthma, uncomplicated: Secondary | ICD-10-CM | POA: Diagnosis not present

## 2021-06-21 DIAGNOSIS — Z8673 Personal history of transient ischemic attack (TIA), and cerebral infarction without residual deficits: Secondary | ICD-10-CM | POA: Diagnosis not present

## 2021-06-21 DIAGNOSIS — I509 Heart failure, unspecified: Secondary | ICD-10-CM | POA: Diagnosis not present

## 2021-06-21 DIAGNOSIS — Z880 Allergy status to penicillin: Secondary | ICD-10-CM | POA: Diagnosis not present

## 2021-06-21 DIAGNOSIS — Z7901 Long term (current) use of anticoagulants: Secondary | ICD-10-CM | POA: Diagnosis not present

## 2021-06-21 DIAGNOSIS — S6991XS Unspecified injury of right wrist, hand and finger(s), sequela: Secondary | ICD-10-CM | POA: Diagnosis not present

## 2021-06-21 DIAGNOSIS — Z888 Allergy status to other drugs, medicaments and biological substances status: Secondary | ICD-10-CM | POA: Diagnosis not present

## 2021-06-21 DIAGNOSIS — I129 Hypertensive chronic kidney disease with stage 1 through stage 4 chronic kidney disease, or unspecified chronic kidney disease: Secondary | ICD-10-CM | POA: Diagnosis not present

## 2021-06-21 DIAGNOSIS — E1122 Type 2 diabetes mellitus with diabetic chronic kidney disease: Secondary | ICD-10-CM | POA: Diagnosis not present

## 2021-06-24 DIAGNOSIS — E1165 Type 2 diabetes mellitus with hyperglycemia: Secondary | ICD-10-CM | POA: Diagnosis not present

## 2021-06-24 DIAGNOSIS — I4819 Other persistent atrial fibrillation: Secondary | ICD-10-CM | POA: Diagnosis not present

## 2021-06-24 DIAGNOSIS — I2510 Atherosclerotic heart disease of native coronary artery without angina pectoris: Secondary | ICD-10-CM | POA: Diagnosis not present

## 2021-06-24 DIAGNOSIS — E785 Hyperlipidemia, unspecified: Secondary | ICD-10-CM | POA: Diagnosis not present

## 2021-06-24 DIAGNOSIS — N1832 Chronic kidney disease, stage 3b: Secondary | ICD-10-CM | POA: Diagnosis not present

## 2021-06-24 DIAGNOSIS — I130 Hypertensive heart and chronic kidney disease with heart failure and stage 1 through stage 4 chronic kidney disease, or unspecified chronic kidney disease: Secondary | ICD-10-CM | POA: Diagnosis not present

## 2021-06-24 DIAGNOSIS — N3000 Acute cystitis without hematuria: Secondary | ICD-10-CM | POA: Diagnosis not present

## 2021-06-24 DIAGNOSIS — I5020 Unspecified systolic (congestive) heart failure: Secondary | ICD-10-CM | POA: Diagnosis not present

## 2021-06-24 DIAGNOSIS — M545 Low back pain, unspecified: Secondary | ICD-10-CM | POA: Diagnosis not present

## 2021-06-24 DIAGNOSIS — E1122 Type 2 diabetes mellitus with diabetic chronic kidney disease: Secondary | ICD-10-CM | POA: Diagnosis not present

## 2021-06-24 DIAGNOSIS — R296 Repeated falls: Secondary | ICD-10-CM | POA: Diagnosis not present

## 2021-06-24 DIAGNOSIS — G8929 Other chronic pain: Secondary | ICD-10-CM | POA: Diagnosis not present

## 2021-06-25 DIAGNOSIS — I10 Essential (primary) hypertension: Secondary | ICD-10-CM | POA: Diagnosis not present

## 2021-06-25 DIAGNOSIS — S52501D Unspecified fracture of the lower end of right radius, subsequent encounter for closed fracture with routine healing: Secondary | ICD-10-CM | POA: Diagnosis not present

## 2021-06-26 DIAGNOSIS — R296 Repeated falls: Secondary | ICD-10-CM | POA: Diagnosis not present

## 2021-06-26 DIAGNOSIS — I4819 Other persistent atrial fibrillation: Secondary | ICD-10-CM | POA: Diagnosis not present

## 2021-06-26 DIAGNOSIS — I130 Hypertensive heart and chronic kidney disease with heart failure and stage 1 through stage 4 chronic kidney disease, or unspecified chronic kidney disease: Secondary | ICD-10-CM | POA: Diagnosis not present

## 2021-06-26 DIAGNOSIS — E1165 Type 2 diabetes mellitus with hyperglycemia: Secondary | ICD-10-CM | POA: Diagnosis not present

## 2021-06-26 DIAGNOSIS — N3000 Acute cystitis without hematuria: Secondary | ICD-10-CM | POA: Diagnosis not present

## 2021-06-26 DIAGNOSIS — I2510 Atherosclerotic heart disease of native coronary artery without angina pectoris: Secondary | ICD-10-CM | POA: Diagnosis not present

## 2021-06-26 DIAGNOSIS — N1832 Chronic kidney disease, stage 3b: Secondary | ICD-10-CM | POA: Diagnosis not present

## 2021-06-26 DIAGNOSIS — E785 Hyperlipidemia, unspecified: Secondary | ICD-10-CM | POA: Diagnosis not present

## 2021-06-26 DIAGNOSIS — G8929 Other chronic pain: Secondary | ICD-10-CM | POA: Diagnosis not present

## 2021-06-26 DIAGNOSIS — M545 Low back pain, unspecified: Secondary | ICD-10-CM | POA: Diagnosis not present

## 2021-06-26 DIAGNOSIS — I5020 Unspecified systolic (congestive) heart failure: Secondary | ICD-10-CM | POA: Diagnosis not present

## 2021-06-26 DIAGNOSIS — E1122 Type 2 diabetes mellitus with diabetic chronic kidney disease: Secondary | ICD-10-CM | POA: Diagnosis not present

## 2021-07-09 DIAGNOSIS — I5020 Unspecified systolic (congestive) heart failure: Secondary | ICD-10-CM | POA: Diagnosis not present

## 2021-07-09 DIAGNOSIS — I130 Hypertensive heart and chronic kidney disease with heart failure and stage 1 through stage 4 chronic kidney disease, or unspecified chronic kidney disease: Secondary | ICD-10-CM | POA: Diagnosis not present

## 2021-07-09 DIAGNOSIS — N1832 Chronic kidney disease, stage 3b: Secondary | ICD-10-CM | POA: Diagnosis not present

## 2021-07-09 DIAGNOSIS — R296 Repeated falls: Secondary | ICD-10-CM | POA: Diagnosis not present

## 2021-07-09 DIAGNOSIS — E1122 Type 2 diabetes mellitus with diabetic chronic kidney disease: Secondary | ICD-10-CM | POA: Diagnosis not present

## 2021-07-09 DIAGNOSIS — Z87440 Personal history of urinary (tract) infections: Secondary | ICD-10-CM | POA: Diagnosis not present

## 2021-07-09 DIAGNOSIS — G8929 Other chronic pain: Secondary | ICD-10-CM | POA: Diagnosis not present

## 2021-07-09 DIAGNOSIS — I2510 Atherosclerotic heart disease of native coronary artery without angina pectoris: Secondary | ICD-10-CM | POA: Diagnosis not present

## 2021-07-09 DIAGNOSIS — E785 Hyperlipidemia, unspecified: Secondary | ICD-10-CM | POA: Diagnosis not present

## 2021-07-09 DIAGNOSIS — I4819 Other persistent atrial fibrillation: Secondary | ICD-10-CM | POA: Diagnosis not present

## 2021-07-09 DIAGNOSIS — M545 Low back pain, unspecified: Secondary | ICD-10-CM | POA: Diagnosis not present

## 2021-07-11 DIAGNOSIS — I2510 Atherosclerotic heart disease of native coronary artery without angina pectoris: Secondary | ICD-10-CM | POA: Diagnosis not present

## 2021-07-11 DIAGNOSIS — I5020 Unspecified systolic (congestive) heart failure: Secondary | ICD-10-CM | POA: Diagnosis not present

## 2021-07-11 DIAGNOSIS — I4819 Other persistent atrial fibrillation: Secondary | ICD-10-CM | POA: Diagnosis not present

## 2021-07-11 DIAGNOSIS — E785 Hyperlipidemia, unspecified: Secondary | ICD-10-CM | POA: Diagnosis not present

## 2021-07-11 DIAGNOSIS — Z87440 Personal history of urinary (tract) infections: Secondary | ICD-10-CM | POA: Diagnosis not present

## 2021-07-11 DIAGNOSIS — G8929 Other chronic pain: Secondary | ICD-10-CM | POA: Diagnosis not present

## 2021-07-11 DIAGNOSIS — E1122 Type 2 diabetes mellitus with diabetic chronic kidney disease: Secondary | ICD-10-CM | POA: Diagnosis not present

## 2021-07-11 DIAGNOSIS — N1832 Chronic kidney disease, stage 3b: Secondary | ICD-10-CM | POA: Diagnosis not present

## 2021-07-11 DIAGNOSIS — I130 Hypertensive heart and chronic kidney disease with heart failure and stage 1 through stage 4 chronic kidney disease, or unspecified chronic kidney disease: Secondary | ICD-10-CM | POA: Diagnosis not present

## 2021-07-11 DIAGNOSIS — R296 Repeated falls: Secondary | ICD-10-CM | POA: Diagnosis not present

## 2021-07-11 DIAGNOSIS — M545 Low back pain, unspecified: Secondary | ICD-10-CM | POA: Diagnosis not present

## 2021-07-15 DIAGNOSIS — R296 Repeated falls: Secondary | ICD-10-CM | POA: Diagnosis not present

## 2021-07-15 DIAGNOSIS — I130 Hypertensive heart and chronic kidney disease with heart failure and stage 1 through stage 4 chronic kidney disease, or unspecified chronic kidney disease: Secondary | ICD-10-CM | POA: Diagnosis not present

## 2021-07-15 DIAGNOSIS — E1122 Type 2 diabetes mellitus with diabetic chronic kidney disease: Secondary | ICD-10-CM | POA: Diagnosis not present

## 2021-07-15 DIAGNOSIS — I5020 Unspecified systolic (congestive) heart failure: Secondary | ICD-10-CM | POA: Diagnosis not present

## 2021-07-15 DIAGNOSIS — I2510 Atherosclerotic heart disease of native coronary artery without angina pectoris: Secondary | ICD-10-CM | POA: Diagnosis not present

## 2021-07-15 DIAGNOSIS — N1832 Chronic kidney disease, stage 3b: Secondary | ICD-10-CM | POA: Diagnosis not present

## 2021-07-15 DIAGNOSIS — Z87440 Personal history of urinary (tract) infections: Secondary | ICD-10-CM | POA: Diagnosis not present

## 2021-07-15 DIAGNOSIS — E785 Hyperlipidemia, unspecified: Secondary | ICD-10-CM | POA: Diagnosis not present

## 2021-07-15 DIAGNOSIS — M545 Low back pain, unspecified: Secondary | ICD-10-CM | POA: Diagnosis not present

## 2021-07-15 DIAGNOSIS — I4819 Other persistent atrial fibrillation: Secondary | ICD-10-CM | POA: Diagnosis not present

## 2021-07-15 DIAGNOSIS — G8929 Other chronic pain: Secondary | ICD-10-CM | POA: Diagnosis not present

## 2021-07-19 DIAGNOSIS — M545 Low back pain, unspecified: Secondary | ICD-10-CM | POA: Diagnosis not present

## 2021-07-19 DIAGNOSIS — I130 Hypertensive heart and chronic kidney disease with heart failure and stage 1 through stage 4 chronic kidney disease, or unspecified chronic kidney disease: Secondary | ICD-10-CM | POA: Diagnosis not present

## 2021-07-19 DIAGNOSIS — N1832 Chronic kidney disease, stage 3b: Secondary | ICD-10-CM | POA: Diagnosis not present

## 2021-07-19 DIAGNOSIS — I4819 Other persistent atrial fibrillation: Secondary | ICD-10-CM | POA: Diagnosis not present

## 2021-07-19 DIAGNOSIS — E785 Hyperlipidemia, unspecified: Secondary | ICD-10-CM | POA: Diagnosis not present

## 2021-07-19 DIAGNOSIS — I2510 Atherosclerotic heart disease of native coronary artery without angina pectoris: Secondary | ICD-10-CM | POA: Diagnosis not present

## 2021-07-19 DIAGNOSIS — I5020 Unspecified systolic (congestive) heart failure: Secondary | ICD-10-CM | POA: Diagnosis not present

## 2021-07-19 DIAGNOSIS — E1122 Type 2 diabetes mellitus with diabetic chronic kidney disease: Secondary | ICD-10-CM | POA: Diagnosis not present

## 2021-07-19 DIAGNOSIS — G8929 Other chronic pain: Secondary | ICD-10-CM | POA: Diagnosis not present

## 2021-07-19 DIAGNOSIS — R296 Repeated falls: Secondary | ICD-10-CM | POA: Diagnosis not present

## 2021-07-19 DIAGNOSIS — Z87440 Personal history of urinary (tract) infections: Secondary | ICD-10-CM | POA: Diagnosis not present

## 2021-07-25 DIAGNOSIS — I454 Nonspecific intraventricular block: Secondary | ICD-10-CM | POA: Diagnosis not present

## 2021-07-25 DIAGNOSIS — I5020 Unspecified systolic (congestive) heart failure: Secondary | ICD-10-CM | POA: Diagnosis not present

## 2021-07-25 DIAGNOSIS — Z95818 Presence of other cardiac implants and grafts: Secondary | ICD-10-CM | POA: Diagnosis not present

## 2021-07-25 DIAGNOSIS — G459 Transient cerebral ischemic attack, unspecified: Secondary | ICD-10-CM | POA: Diagnosis not present

## 2021-07-25 DIAGNOSIS — Z853 Personal history of malignant neoplasm of breast: Secondary | ICD-10-CM | POA: Diagnosis not present

## 2021-07-25 DIAGNOSIS — E1165 Type 2 diabetes mellitus with hyperglycemia: Secondary | ICD-10-CM | POA: Diagnosis not present

## 2021-07-25 DIAGNOSIS — R638 Other symptoms and signs concerning food and fluid intake: Secondary | ICD-10-CM | POA: Diagnosis not present

## 2021-07-25 DIAGNOSIS — J45909 Unspecified asthma, uncomplicated: Secondary | ICD-10-CM | POA: Diagnosis not present

## 2021-07-25 DIAGNOSIS — Z87440 Personal history of urinary (tract) infections: Secondary | ICD-10-CM | POA: Diagnosis not present

## 2021-07-25 DIAGNOSIS — N179 Acute kidney failure, unspecified: Secondary | ICD-10-CM | POA: Diagnosis not present

## 2021-07-25 DIAGNOSIS — E785 Hyperlipidemia, unspecified: Secondary | ICD-10-CM | POA: Diagnosis not present

## 2021-07-25 DIAGNOSIS — I4819 Other persistent atrial fibrillation: Secondary | ICD-10-CM | POA: Diagnosis not present

## 2021-07-25 DIAGNOSIS — N390 Urinary tract infection, site not specified: Secondary | ICD-10-CM | POA: Diagnosis not present

## 2021-07-25 DIAGNOSIS — Z743 Need for continuous supervision: Secondary | ICD-10-CM | POA: Diagnosis not present

## 2021-07-25 DIAGNOSIS — B9689 Other specified bacterial agents as the cause of diseases classified elsewhere: Secondary | ICD-10-CM | POA: Diagnosis not present

## 2021-07-25 DIAGNOSIS — I130 Hypertensive heart and chronic kidney disease with heart failure and stage 1 through stage 4 chronic kidney disease, or unspecified chronic kidney disease: Secondary | ICD-10-CM | POA: Diagnosis not present

## 2021-07-25 DIAGNOSIS — G8929 Other chronic pain: Secondary | ICD-10-CM | POA: Diagnosis not present

## 2021-07-25 DIAGNOSIS — S52611D Displaced fracture of right ulna styloid process, subsequent encounter for closed fracture with routine healing: Secondary | ICD-10-CM | POA: Diagnosis not present

## 2021-07-25 DIAGNOSIS — I10 Essential (primary) hypertension: Secondary | ICD-10-CM | POA: Diagnosis not present

## 2021-07-25 DIAGNOSIS — R5381 Other malaise: Secondary | ICD-10-CM | POA: Diagnosis not present

## 2021-07-25 DIAGNOSIS — Z79899 Other long term (current) drug therapy: Secondary | ICD-10-CM | POA: Diagnosis not present

## 2021-07-25 DIAGNOSIS — N3000 Acute cystitis without hematuria: Secondary | ICD-10-CM | POA: Diagnosis not present

## 2021-07-25 DIAGNOSIS — I2510 Atherosclerotic heart disease of native coronary artery without angina pectoris: Secondary | ICD-10-CM | POA: Diagnosis not present

## 2021-07-25 DIAGNOSIS — R531 Weakness: Secondary | ICD-10-CM | POA: Diagnosis not present

## 2021-07-25 DIAGNOSIS — Z885 Allergy status to narcotic agent status: Secondary | ICD-10-CM | POA: Diagnosis not present

## 2021-07-25 DIAGNOSIS — N1832 Chronic kidney disease, stage 3b: Secondary | ICD-10-CM | POA: Diagnosis not present

## 2021-07-25 DIAGNOSIS — Z952 Presence of prosthetic heart valve: Secondary | ICD-10-CM | POA: Diagnosis not present

## 2021-07-25 DIAGNOSIS — F32A Depression, unspecified: Secondary | ICD-10-CM | POA: Diagnosis not present

## 2021-07-25 DIAGNOSIS — Z950 Presence of cardiac pacemaker: Secondary | ICD-10-CM | POA: Diagnosis not present

## 2021-07-25 DIAGNOSIS — W19XXXA Unspecified fall, initial encounter: Secondary | ICD-10-CM | POA: Diagnosis not present

## 2021-07-25 DIAGNOSIS — Z8673 Personal history of transient ischemic attack (TIA), and cerebral infarction without residual deficits: Secondary | ICD-10-CM | POA: Diagnosis not present

## 2021-07-25 DIAGNOSIS — G894 Chronic pain syndrome: Secondary | ICD-10-CM | POA: Diagnosis not present

## 2021-07-25 DIAGNOSIS — M545 Low back pain, unspecified: Secondary | ICD-10-CM | POA: Diagnosis not present

## 2021-07-25 DIAGNOSIS — E1122 Type 2 diabetes mellitus with diabetic chronic kidney disease: Secondary | ICD-10-CM | POA: Diagnosis not present

## 2021-07-25 DIAGNOSIS — Z87891 Personal history of nicotine dependence: Secondary | ICD-10-CM | POA: Diagnosis not present

## 2021-07-25 DIAGNOSIS — R197 Diarrhea, unspecified: Secondary | ICD-10-CM | POA: Diagnosis not present

## 2021-07-25 DIAGNOSIS — I5022 Chronic systolic (congestive) heart failure: Secondary | ICD-10-CM | POA: Diagnosis not present

## 2021-07-25 DIAGNOSIS — R296 Repeated falls: Secondary | ICD-10-CM | POA: Diagnosis not present

## 2021-07-25 DIAGNOSIS — S52501D Unspecified fracture of the lower end of right radius, subsequent encounter for closed fracture with routine healing: Secondary | ICD-10-CM | POA: Diagnosis not present

## 2021-07-25 DIAGNOSIS — R404 Transient alteration of awareness: Secondary | ICD-10-CM | POA: Diagnosis not present

## 2021-07-25 DIAGNOSIS — S299XXA Unspecified injury of thorax, initial encounter: Secondary | ICD-10-CM | POA: Diagnosis not present

## 2021-07-25 DIAGNOSIS — R0781 Pleurodynia: Secondary | ICD-10-CM | POA: Diagnosis not present

## 2021-07-25 DIAGNOSIS — I4811 Longstanding persistent atrial fibrillation: Secondary | ICD-10-CM | POA: Diagnosis not present

## 2021-07-25 DIAGNOSIS — S52591D Other fractures of lower end of right radius, subsequent encounter for closed fracture with routine healing: Secondary | ICD-10-CM | POA: Diagnosis not present

## 2021-07-26 DIAGNOSIS — Z87891 Personal history of nicotine dependence: Secondary | ICD-10-CM | POA: Diagnosis not present

## 2021-07-26 DIAGNOSIS — N1832 Chronic kidney disease, stage 3b: Secondary | ICD-10-CM | POA: Diagnosis not present

## 2021-07-26 DIAGNOSIS — Z8673 Personal history of transient ischemic attack (TIA), and cerebral infarction without residual deficits: Secondary | ICD-10-CM | POA: Diagnosis not present

## 2021-07-26 DIAGNOSIS — N3000 Acute cystitis without hematuria: Secondary | ICD-10-CM | POA: Diagnosis not present

## 2021-07-26 DIAGNOSIS — E1165 Type 2 diabetes mellitus with hyperglycemia: Secondary | ICD-10-CM | POA: Diagnosis not present

## 2021-07-26 DIAGNOSIS — Z853 Personal history of malignant neoplasm of breast: Secondary | ICD-10-CM | POA: Diagnosis not present

## 2021-07-26 DIAGNOSIS — Z79899 Other long term (current) drug therapy: Secondary | ICD-10-CM | POA: Diagnosis not present

## 2021-07-26 DIAGNOSIS — E1122 Type 2 diabetes mellitus with diabetic chronic kidney disease: Secondary | ICD-10-CM | POA: Diagnosis not present

## 2021-07-26 DIAGNOSIS — Z95818 Presence of other cardiac implants and grafts: Secondary | ICD-10-CM | POA: Diagnosis not present

## 2021-07-26 DIAGNOSIS — R197 Diarrhea, unspecified: Secondary | ICD-10-CM | POA: Diagnosis not present

## 2021-07-26 DIAGNOSIS — I2510 Atherosclerotic heart disease of native coronary artery without angina pectoris: Secondary | ICD-10-CM | POA: Diagnosis not present

## 2021-07-26 DIAGNOSIS — F32A Depression, unspecified: Secondary | ICD-10-CM | POA: Diagnosis not present

## 2021-07-26 DIAGNOSIS — I4811 Longstanding persistent atrial fibrillation: Secondary | ICD-10-CM | POA: Diagnosis not present

## 2021-07-26 DIAGNOSIS — E785 Hyperlipidemia, unspecified: Secondary | ICD-10-CM | POA: Diagnosis not present

## 2021-07-26 DIAGNOSIS — R296 Repeated falls: Secondary | ICD-10-CM | POA: Diagnosis not present

## 2021-07-26 DIAGNOSIS — I517 Cardiomegaly: Secondary | ICD-10-CM | POA: Diagnosis not present

## 2021-07-26 DIAGNOSIS — I5022 Chronic systolic (congestive) heart failure: Secondary | ICD-10-CM | POA: Diagnosis not present

## 2021-07-26 DIAGNOSIS — Z885 Allergy status to narcotic agent status: Secondary | ICD-10-CM | POA: Diagnosis not present

## 2021-07-26 DIAGNOSIS — I080 Rheumatic disorders of both mitral and aortic valves: Secondary | ICD-10-CM | POA: Diagnosis not present

## 2021-07-26 DIAGNOSIS — B9689 Other specified bacterial agents as the cause of diseases classified elsewhere: Secondary | ICD-10-CM | POA: Diagnosis not present

## 2021-07-26 DIAGNOSIS — J45909 Unspecified asthma, uncomplicated: Secondary | ICD-10-CM | POA: Diagnosis not present

## 2021-07-26 DIAGNOSIS — I454 Nonspecific intraventricular block: Secondary | ICD-10-CM | POA: Diagnosis not present

## 2021-07-26 DIAGNOSIS — Z952 Presence of prosthetic heart valve: Secondary | ICD-10-CM | POA: Diagnosis not present

## 2021-07-26 DIAGNOSIS — Z950 Presence of cardiac pacemaker: Secondary | ICD-10-CM | POA: Diagnosis not present

## 2021-07-26 DIAGNOSIS — I130 Hypertensive heart and chronic kidney disease with heart failure and stage 1 through stage 4 chronic kidney disease, or unspecified chronic kidney disease: Secondary | ICD-10-CM | POA: Diagnosis not present

## 2021-07-27 DIAGNOSIS — F32A Depression, unspecified: Secondary | ICD-10-CM | POA: Diagnosis not present

## 2021-07-27 DIAGNOSIS — Z952 Presence of prosthetic heart valve: Secondary | ICD-10-CM | POA: Diagnosis not present

## 2021-07-27 DIAGNOSIS — I454 Nonspecific intraventricular block: Secondary | ICD-10-CM | POA: Diagnosis not present

## 2021-07-27 DIAGNOSIS — N3000 Acute cystitis without hematuria: Secondary | ICD-10-CM | POA: Diagnosis not present

## 2021-07-27 DIAGNOSIS — I4811 Longstanding persistent atrial fibrillation: Secondary | ICD-10-CM | POA: Diagnosis not present

## 2021-07-27 DIAGNOSIS — Z885 Allergy status to narcotic agent status: Secondary | ICD-10-CM | POA: Diagnosis not present

## 2021-07-27 DIAGNOSIS — N1832 Chronic kidney disease, stage 3b: Secondary | ICD-10-CM | POA: Diagnosis not present

## 2021-07-27 DIAGNOSIS — B9689 Other specified bacterial agents as the cause of diseases classified elsewhere: Secondary | ICD-10-CM | POA: Diagnosis not present

## 2021-07-27 DIAGNOSIS — I130 Hypertensive heart and chronic kidney disease with heart failure and stage 1 through stage 4 chronic kidney disease, or unspecified chronic kidney disease: Secondary | ICD-10-CM | POA: Diagnosis not present

## 2021-07-27 DIAGNOSIS — R197 Diarrhea, unspecified: Secondary | ICD-10-CM | POA: Diagnosis not present

## 2021-07-27 DIAGNOSIS — J45909 Unspecified asthma, uncomplicated: Secondary | ICD-10-CM | POA: Diagnosis not present

## 2021-07-27 DIAGNOSIS — Z87891 Personal history of nicotine dependence: Secondary | ICD-10-CM | POA: Diagnosis not present

## 2021-07-27 DIAGNOSIS — Z8673 Personal history of transient ischemic attack (TIA), and cerebral infarction without residual deficits: Secondary | ICD-10-CM | POA: Diagnosis not present

## 2021-07-27 DIAGNOSIS — R296 Repeated falls: Secondary | ICD-10-CM | POA: Diagnosis not present

## 2021-07-27 DIAGNOSIS — Z79899 Other long term (current) drug therapy: Secondary | ICD-10-CM | POA: Diagnosis not present

## 2021-07-27 DIAGNOSIS — I5022 Chronic systolic (congestive) heart failure: Secondary | ICD-10-CM | POA: Diagnosis not present

## 2021-07-27 DIAGNOSIS — Z853 Personal history of malignant neoplasm of breast: Secondary | ICD-10-CM | POA: Diagnosis not present

## 2021-07-27 DIAGNOSIS — E785 Hyperlipidemia, unspecified: Secondary | ICD-10-CM | POA: Diagnosis not present

## 2021-07-27 DIAGNOSIS — Z950 Presence of cardiac pacemaker: Secondary | ICD-10-CM | POA: Diagnosis not present

## 2021-07-27 DIAGNOSIS — I2510 Atherosclerotic heart disease of native coronary artery without angina pectoris: Secondary | ICD-10-CM | POA: Diagnosis not present

## 2021-07-27 DIAGNOSIS — E1122 Type 2 diabetes mellitus with diabetic chronic kidney disease: Secondary | ICD-10-CM | POA: Diagnosis not present

## 2021-07-27 DIAGNOSIS — E1165 Type 2 diabetes mellitus with hyperglycemia: Secondary | ICD-10-CM | POA: Diagnosis not present

## 2021-07-27 DIAGNOSIS — S299XXA Unspecified injury of thorax, initial encounter: Secondary | ICD-10-CM | POA: Diagnosis not present

## 2021-07-27 DIAGNOSIS — Z95818 Presence of other cardiac implants and grafts: Secondary | ICD-10-CM | POA: Diagnosis not present

## 2021-07-28 DIAGNOSIS — R197 Diarrhea, unspecified: Secondary | ICD-10-CM | POA: Diagnosis not present

## 2021-07-28 DIAGNOSIS — I2510 Atherosclerotic heart disease of native coronary artery without angina pectoris: Secondary | ICD-10-CM | POA: Diagnosis not present

## 2021-07-28 DIAGNOSIS — N1832 Chronic kidney disease, stage 3b: Secondary | ICD-10-CM | POA: Diagnosis not present

## 2021-07-28 DIAGNOSIS — Z95818 Presence of other cardiac implants and grafts: Secondary | ICD-10-CM | POA: Diagnosis not present

## 2021-07-28 DIAGNOSIS — Z950 Presence of cardiac pacemaker: Secondary | ICD-10-CM | POA: Diagnosis not present

## 2021-07-28 DIAGNOSIS — Z87891 Personal history of nicotine dependence: Secondary | ICD-10-CM | POA: Diagnosis not present

## 2021-07-28 DIAGNOSIS — E785 Hyperlipidemia, unspecified: Secondary | ICD-10-CM | POA: Diagnosis not present

## 2021-07-28 DIAGNOSIS — E1165 Type 2 diabetes mellitus with hyperglycemia: Secondary | ICD-10-CM | POA: Diagnosis not present

## 2021-07-28 DIAGNOSIS — Z8673 Personal history of transient ischemic attack (TIA), and cerebral infarction without residual deficits: Secondary | ICD-10-CM | POA: Diagnosis not present

## 2021-07-28 DIAGNOSIS — N3000 Acute cystitis without hematuria: Secondary | ICD-10-CM | POA: Diagnosis not present

## 2021-07-28 DIAGNOSIS — F32A Depression, unspecified: Secondary | ICD-10-CM | POA: Diagnosis not present

## 2021-07-28 DIAGNOSIS — Z885 Allergy status to narcotic agent status: Secondary | ICD-10-CM | POA: Diagnosis not present

## 2021-07-28 DIAGNOSIS — Z952 Presence of prosthetic heart valve: Secondary | ICD-10-CM | POA: Diagnosis not present

## 2021-07-28 DIAGNOSIS — I454 Nonspecific intraventricular block: Secondary | ICD-10-CM | POA: Diagnosis not present

## 2021-07-28 DIAGNOSIS — J45909 Unspecified asthma, uncomplicated: Secondary | ICD-10-CM | POA: Diagnosis not present

## 2021-07-28 DIAGNOSIS — I5022 Chronic systolic (congestive) heart failure: Secondary | ICD-10-CM | POA: Diagnosis not present

## 2021-07-28 DIAGNOSIS — E1122 Type 2 diabetes mellitus with diabetic chronic kidney disease: Secondary | ICD-10-CM | POA: Diagnosis not present

## 2021-07-28 DIAGNOSIS — Z79899 Other long term (current) drug therapy: Secondary | ICD-10-CM | POA: Diagnosis not present

## 2021-07-28 DIAGNOSIS — I130 Hypertensive heart and chronic kidney disease with heart failure and stage 1 through stage 4 chronic kidney disease, or unspecified chronic kidney disease: Secondary | ICD-10-CM | POA: Diagnosis not present

## 2021-07-28 DIAGNOSIS — Z853 Personal history of malignant neoplasm of breast: Secondary | ICD-10-CM | POA: Diagnosis not present

## 2021-07-28 DIAGNOSIS — I4811 Longstanding persistent atrial fibrillation: Secondary | ICD-10-CM | POA: Diagnosis not present

## 2021-07-28 DIAGNOSIS — R296 Repeated falls: Secondary | ICD-10-CM | POA: Diagnosis not present

## 2021-07-28 DIAGNOSIS — B9689 Other specified bacterial agents as the cause of diseases classified elsewhere: Secondary | ICD-10-CM | POA: Diagnosis not present

## 2021-07-29 DIAGNOSIS — E1122 Type 2 diabetes mellitus with diabetic chronic kidney disease: Secondary | ICD-10-CM | POA: Diagnosis not present

## 2021-07-29 DIAGNOSIS — I2510 Atherosclerotic heart disease of native coronary artery without angina pectoris: Secondary | ICD-10-CM | POA: Diagnosis not present

## 2021-07-29 DIAGNOSIS — I5022 Chronic systolic (congestive) heart failure: Secondary | ICD-10-CM | POA: Diagnosis not present

## 2021-07-29 DIAGNOSIS — Z79899 Other long term (current) drug therapy: Secondary | ICD-10-CM | POA: Diagnosis not present

## 2021-07-29 DIAGNOSIS — R197 Diarrhea, unspecified: Secondary | ICD-10-CM | POA: Diagnosis not present

## 2021-07-29 DIAGNOSIS — N1832 Chronic kidney disease, stage 3b: Secondary | ICD-10-CM | POA: Diagnosis not present

## 2021-07-29 DIAGNOSIS — R296 Repeated falls: Secondary | ICD-10-CM | POA: Diagnosis not present

## 2021-07-29 DIAGNOSIS — I130 Hypertensive heart and chronic kidney disease with heart failure and stage 1 through stage 4 chronic kidney disease, or unspecified chronic kidney disease: Secondary | ICD-10-CM | POA: Diagnosis not present

## 2021-07-29 DIAGNOSIS — B9689 Other specified bacterial agents as the cause of diseases classified elsewhere: Secondary | ICD-10-CM | POA: Diagnosis not present

## 2021-07-29 DIAGNOSIS — E1165 Type 2 diabetes mellitus with hyperglycemia: Secondary | ICD-10-CM | POA: Diagnosis not present

## 2021-07-29 DIAGNOSIS — Z853 Personal history of malignant neoplasm of breast: Secondary | ICD-10-CM | POA: Diagnosis not present

## 2021-07-29 DIAGNOSIS — J45909 Unspecified asthma, uncomplicated: Secondary | ICD-10-CM | POA: Diagnosis not present

## 2021-07-29 DIAGNOSIS — I4811 Longstanding persistent atrial fibrillation: Secondary | ICD-10-CM | POA: Diagnosis not present

## 2021-07-29 DIAGNOSIS — Z95818 Presence of other cardiac implants and grafts: Secondary | ICD-10-CM | POA: Diagnosis not present

## 2021-07-29 DIAGNOSIS — Z8673 Personal history of transient ischemic attack (TIA), and cerebral infarction without residual deficits: Secondary | ICD-10-CM | POA: Diagnosis not present

## 2021-07-29 DIAGNOSIS — F32A Depression, unspecified: Secondary | ICD-10-CM | POA: Diagnosis not present

## 2021-07-29 DIAGNOSIS — Z87891 Personal history of nicotine dependence: Secondary | ICD-10-CM | POA: Diagnosis not present

## 2021-07-29 DIAGNOSIS — Z885 Allergy status to narcotic agent status: Secondary | ICD-10-CM | POA: Diagnosis not present

## 2021-07-29 DIAGNOSIS — I454 Nonspecific intraventricular block: Secondary | ICD-10-CM | POA: Diagnosis not present

## 2021-07-29 DIAGNOSIS — E785 Hyperlipidemia, unspecified: Secondary | ICD-10-CM | POA: Diagnosis not present

## 2021-07-29 DIAGNOSIS — N3000 Acute cystitis without hematuria: Secondary | ICD-10-CM | POA: Diagnosis not present

## 2021-07-29 DIAGNOSIS — Z950 Presence of cardiac pacemaker: Secondary | ICD-10-CM | POA: Diagnosis not present

## 2021-07-29 DIAGNOSIS — Z952 Presence of prosthetic heart valve: Secondary | ICD-10-CM | POA: Diagnosis not present

## 2021-07-30 DIAGNOSIS — E1122 Type 2 diabetes mellitus with diabetic chronic kidney disease: Secondary | ICD-10-CM | POA: Diagnosis not present

## 2021-07-30 DIAGNOSIS — I454 Nonspecific intraventricular block: Secondary | ICD-10-CM | POA: Diagnosis not present

## 2021-07-30 DIAGNOSIS — I2510 Atherosclerotic heart disease of native coronary artery without angina pectoris: Secondary | ICD-10-CM | POA: Diagnosis not present

## 2021-07-30 DIAGNOSIS — Z95818 Presence of other cardiac implants and grafts: Secondary | ICD-10-CM | POA: Diagnosis not present

## 2021-07-30 DIAGNOSIS — J45909 Unspecified asthma, uncomplicated: Secondary | ICD-10-CM | POA: Diagnosis not present

## 2021-07-30 DIAGNOSIS — B9689 Other specified bacterial agents as the cause of diseases classified elsewhere: Secondary | ICD-10-CM | POA: Diagnosis not present

## 2021-07-30 DIAGNOSIS — N179 Acute kidney failure, unspecified: Secondary | ICD-10-CM | POA: Diagnosis not present

## 2021-07-30 DIAGNOSIS — Z950 Presence of cardiac pacemaker: Secondary | ICD-10-CM | POA: Diagnosis not present

## 2021-07-30 DIAGNOSIS — E1165 Type 2 diabetes mellitus with hyperglycemia: Secondary | ICD-10-CM | POA: Diagnosis not present

## 2021-07-30 DIAGNOSIS — Z79899 Other long term (current) drug therapy: Secondary | ICD-10-CM | POA: Diagnosis not present

## 2021-07-30 DIAGNOSIS — N3000 Acute cystitis without hematuria: Secondary | ICD-10-CM | POA: Diagnosis not present

## 2021-07-30 DIAGNOSIS — Z885 Allergy status to narcotic agent status: Secondary | ICD-10-CM | POA: Diagnosis not present

## 2021-07-30 DIAGNOSIS — Z8673 Personal history of transient ischemic attack (TIA), and cerebral infarction without residual deficits: Secondary | ICD-10-CM | POA: Diagnosis not present

## 2021-07-30 DIAGNOSIS — I5022 Chronic systolic (congestive) heart failure: Secondary | ICD-10-CM | POA: Diagnosis not present

## 2021-07-30 DIAGNOSIS — Z853 Personal history of malignant neoplasm of breast: Secondary | ICD-10-CM | POA: Diagnosis not present

## 2021-07-30 DIAGNOSIS — I482 Chronic atrial fibrillation, unspecified: Secondary | ICD-10-CM | POA: Diagnosis not present

## 2021-07-30 DIAGNOSIS — N1832 Chronic kidney disease, stage 3b: Secondary | ICD-10-CM | POA: Diagnosis not present

## 2021-07-30 DIAGNOSIS — E785 Hyperlipidemia, unspecified: Secondary | ICD-10-CM | POA: Diagnosis not present

## 2021-07-30 DIAGNOSIS — R197 Diarrhea, unspecified: Secondary | ICD-10-CM | POA: Diagnosis not present

## 2021-07-30 DIAGNOSIS — I130 Hypertensive heart and chronic kidney disease with heart failure and stage 1 through stage 4 chronic kidney disease, or unspecified chronic kidney disease: Secondary | ICD-10-CM | POA: Diagnosis not present

## 2021-07-30 DIAGNOSIS — Z952 Presence of prosthetic heart valve: Secondary | ICD-10-CM | POA: Diagnosis not present

## 2021-07-30 DIAGNOSIS — R296 Repeated falls: Secondary | ICD-10-CM | POA: Diagnosis not present

## 2021-07-30 DIAGNOSIS — Z87891 Personal history of nicotine dependence: Secondary | ICD-10-CM | POA: Diagnosis not present

## 2021-07-30 DIAGNOSIS — F32A Depression, unspecified: Secondary | ICD-10-CM | POA: Diagnosis not present

## 2021-07-30 DIAGNOSIS — I4811 Longstanding persistent atrial fibrillation: Secondary | ICD-10-CM | POA: Diagnosis not present

## 2021-07-31 DIAGNOSIS — E785 Hyperlipidemia, unspecified: Secondary | ICD-10-CM | POA: Diagnosis not present

## 2021-07-31 DIAGNOSIS — Z87891 Personal history of nicotine dependence: Secondary | ICD-10-CM | POA: Diagnosis not present

## 2021-07-31 DIAGNOSIS — I2510 Atherosclerotic heart disease of native coronary artery without angina pectoris: Secondary | ICD-10-CM | POA: Diagnosis not present

## 2021-07-31 DIAGNOSIS — N1832 Chronic kidney disease, stage 3b: Secondary | ICD-10-CM | POA: Diagnosis not present

## 2021-07-31 DIAGNOSIS — Z95818 Presence of other cardiac implants and grafts: Secondary | ICD-10-CM | POA: Diagnosis not present

## 2021-07-31 DIAGNOSIS — J45909 Unspecified asthma, uncomplicated: Secondary | ICD-10-CM | POA: Diagnosis not present

## 2021-07-31 DIAGNOSIS — R296 Repeated falls: Secondary | ICD-10-CM | POA: Diagnosis not present

## 2021-07-31 DIAGNOSIS — I5022 Chronic systolic (congestive) heart failure: Secondary | ICD-10-CM | POA: Diagnosis not present

## 2021-07-31 DIAGNOSIS — E1122 Type 2 diabetes mellitus with diabetic chronic kidney disease: Secondary | ICD-10-CM | POA: Diagnosis not present

## 2021-07-31 DIAGNOSIS — B9689 Other specified bacterial agents as the cause of diseases classified elsewhere: Secondary | ICD-10-CM | POA: Diagnosis not present

## 2021-07-31 DIAGNOSIS — N3000 Acute cystitis without hematuria: Secondary | ICD-10-CM | POA: Diagnosis not present

## 2021-07-31 DIAGNOSIS — I130 Hypertensive heart and chronic kidney disease with heart failure and stage 1 through stage 4 chronic kidney disease, or unspecified chronic kidney disease: Secondary | ICD-10-CM | POA: Diagnosis not present

## 2021-07-31 DIAGNOSIS — Z8673 Personal history of transient ischemic attack (TIA), and cerebral infarction without residual deficits: Secondary | ICD-10-CM | POA: Diagnosis not present

## 2021-07-31 DIAGNOSIS — Z885 Allergy status to narcotic agent status: Secondary | ICD-10-CM | POA: Diagnosis not present

## 2021-07-31 DIAGNOSIS — I454 Nonspecific intraventricular block: Secondary | ICD-10-CM | POA: Diagnosis not present

## 2021-07-31 DIAGNOSIS — N179 Acute kidney failure, unspecified: Secondary | ICD-10-CM | POA: Diagnosis not present

## 2021-07-31 DIAGNOSIS — R197 Diarrhea, unspecified: Secondary | ICD-10-CM | POA: Diagnosis not present

## 2021-07-31 DIAGNOSIS — I4811 Longstanding persistent atrial fibrillation: Secondary | ICD-10-CM | POA: Diagnosis not present

## 2021-07-31 DIAGNOSIS — Z79899 Other long term (current) drug therapy: Secondary | ICD-10-CM | POA: Diagnosis not present

## 2021-07-31 DIAGNOSIS — Z950 Presence of cardiac pacemaker: Secondary | ICD-10-CM | POA: Diagnosis not present

## 2021-07-31 DIAGNOSIS — Z952 Presence of prosthetic heart valve: Secondary | ICD-10-CM | POA: Diagnosis not present

## 2021-07-31 DIAGNOSIS — Z853 Personal history of malignant neoplasm of breast: Secondary | ICD-10-CM | POA: Diagnosis not present

## 2021-07-31 DIAGNOSIS — I482 Chronic atrial fibrillation, unspecified: Secondary | ICD-10-CM | POA: Diagnosis not present

## 2021-07-31 DIAGNOSIS — F32A Depression, unspecified: Secondary | ICD-10-CM | POA: Diagnosis not present

## 2021-07-31 DIAGNOSIS — E1165 Type 2 diabetes mellitus with hyperglycemia: Secondary | ICD-10-CM | POA: Diagnosis not present

## 2021-08-01 DIAGNOSIS — I130 Hypertensive heart and chronic kidney disease with heart failure and stage 1 through stage 4 chronic kidney disease, or unspecified chronic kidney disease: Secondary | ICD-10-CM | POA: Diagnosis not present

## 2021-08-01 DIAGNOSIS — Z885 Allergy status to narcotic agent status: Secondary | ICD-10-CM | POA: Diagnosis not present

## 2021-08-01 DIAGNOSIS — N3000 Acute cystitis without hematuria: Secondary | ICD-10-CM | POA: Diagnosis not present

## 2021-08-01 DIAGNOSIS — Z952 Presence of prosthetic heart valve: Secondary | ICD-10-CM | POA: Diagnosis not present

## 2021-08-01 DIAGNOSIS — I4811 Longstanding persistent atrial fibrillation: Secondary | ICD-10-CM | POA: Diagnosis not present

## 2021-08-01 DIAGNOSIS — R197 Diarrhea, unspecified: Secondary | ICD-10-CM | POA: Diagnosis not present

## 2021-08-01 DIAGNOSIS — Z8673 Personal history of transient ischemic attack (TIA), and cerebral infarction without residual deficits: Secondary | ICD-10-CM | POA: Diagnosis not present

## 2021-08-01 DIAGNOSIS — Z853 Personal history of malignant neoplasm of breast: Secondary | ICD-10-CM | POA: Diagnosis not present

## 2021-08-01 DIAGNOSIS — E1165 Type 2 diabetes mellitus with hyperglycemia: Secondary | ICD-10-CM | POA: Diagnosis not present

## 2021-08-01 DIAGNOSIS — R296 Repeated falls: Secondary | ICD-10-CM | POA: Diagnosis not present

## 2021-08-01 DIAGNOSIS — Z79899 Other long term (current) drug therapy: Secondary | ICD-10-CM | POA: Diagnosis not present

## 2021-08-01 DIAGNOSIS — Z95818 Presence of other cardiac implants and grafts: Secondary | ICD-10-CM | POA: Diagnosis not present

## 2021-08-01 DIAGNOSIS — I2510 Atherosclerotic heart disease of native coronary artery without angina pectoris: Secondary | ICD-10-CM | POA: Diagnosis not present

## 2021-08-01 DIAGNOSIS — F32A Depression, unspecified: Secondary | ICD-10-CM | POA: Diagnosis not present

## 2021-08-01 DIAGNOSIS — B9689 Other specified bacterial agents as the cause of diseases classified elsewhere: Secondary | ICD-10-CM | POA: Diagnosis not present

## 2021-08-01 DIAGNOSIS — N179 Acute kidney failure, unspecified: Secondary | ICD-10-CM | POA: Diagnosis not present

## 2021-08-01 DIAGNOSIS — E785 Hyperlipidemia, unspecified: Secondary | ICD-10-CM | POA: Diagnosis not present

## 2021-08-01 DIAGNOSIS — J45909 Unspecified asthma, uncomplicated: Secondary | ICD-10-CM | POA: Diagnosis not present

## 2021-08-01 DIAGNOSIS — I454 Nonspecific intraventricular block: Secondary | ICD-10-CM | POA: Diagnosis not present

## 2021-08-01 DIAGNOSIS — N1832 Chronic kidney disease, stage 3b: Secondary | ICD-10-CM | POA: Diagnosis not present

## 2021-08-01 DIAGNOSIS — Z87891 Personal history of nicotine dependence: Secondary | ICD-10-CM | POA: Diagnosis not present

## 2021-08-01 DIAGNOSIS — I482 Chronic atrial fibrillation, unspecified: Secondary | ICD-10-CM | POA: Diagnosis not present

## 2021-08-01 DIAGNOSIS — I5022 Chronic systolic (congestive) heart failure: Secondary | ICD-10-CM | POA: Diagnosis not present

## 2021-08-01 DIAGNOSIS — Z950 Presence of cardiac pacemaker: Secondary | ICD-10-CM | POA: Diagnosis not present

## 2021-08-01 DIAGNOSIS — E1122 Type 2 diabetes mellitus with diabetic chronic kidney disease: Secondary | ICD-10-CM | POA: Diagnosis not present

## 2021-08-02 DIAGNOSIS — N3000 Acute cystitis without hematuria: Secondary | ICD-10-CM | POA: Diagnosis not present

## 2021-08-02 DIAGNOSIS — N179 Acute kidney failure, unspecified: Secondary | ICD-10-CM | POA: Diagnosis not present

## 2021-08-02 DIAGNOSIS — I454 Nonspecific intraventricular block: Secondary | ICD-10-CM | POA: Diagnosis not present

## 2021-08-02 DIAGNOSIS — Z8673 Personal history of transient ischemic attack (TIA), and cerebral infarction without residual deficits: Secondary | ICD-10-CM | POA: Diagnosis not present

## 2021-08-02 DIAGNOSIS — J45909 Unspecified asthma, uncomplicated: Secondary | ICD-10-CM | POA: Diagnosis not present

## 2021-08-02 DIAGNOSIS — R197 Diarrhea, unspecified: Secondary | ICD-10-CM | POA: Diagnosis not present

## 2021-08-02 DIAGNOSIS — E785 Hyperlipidemia, unspecified: Secondary | ICD-10-CM | POA: Diagnosis not present

## 2021-08-02 DIAGNOSIS — Z950 Presence of cardiac pacemaker: Secondary | ICD-10-CM | POA: Diagnosis not present

## 2021-08-02 DIAGNOSIS — I2510 Atherosclerotic heart disease of native coronary artery without angina pectoris: Secondary | ICD-10-CM | POA: Diagnosis not present

## 2021-08-02 DIAGNOSIS — E1122 Type 2 diabetes mellitus with diabetic chronic kidney disease: Secondary | ICD-10-CM | POA: Diagnosis not present

## 2021-08-02 DIAGNOSIS — I482 Chronic atrial fibrillation, unspecified: Secondary | ICD-10-CM | POA: Diagnosis not present

## 2021-08-02 DIAGNOSIS — E1165 Type 2 diabetes mellitus with hyperglycemia: Secondary | ICD-10-CM | POA: Diagnosis not present

## 2021-08-02 DIAGNOSIS — I5022 Chronic systolic (congestive) heart failure: Secondary | ICD-10-CM | POA: Diagnosis not present

## 2021-08-02 DIAGNOSIS — I130 Hypertensive heart and chronic kidney disease with heart failure and stage 1 through stage 4 chronic kidney disease, or unspecified chronic kidney disease: Secondary | ICD-10-CM | POA: Diagnosis not present

## 2021-08-02 DIAGNOSIS — Z87891 Personal history of nicotine dependence: Secondary | ICD-10-CM | POA: Diagnosis not present

## 2021-08-02 DIAGNOSIS — Z79899 Other long term (current) drug therapy: Secondary | ICD-10-CM | POA: Diagnosis not present

## 2021-08-02 DIAGNOSIS — F32A Depression, unspecified: Secondary | ICD-10-CM | POA: Diagnosis not present

## 2021-08-02 DIAGNOSIS — Z95818 Presence of other cardiac implants and grafts: Secondary | ICD-10-CM | POA: Diagnosis not present

## 2021-08-02 DIAGNOSIS — N1832 Chronic kidney disease, stage 3b: Secondary | ICD-10-CM | POA: Diagnosis not present

## 2021-08-02 DIAGNOSIS — B9689 Other specified bacterial agents as the cause of diseases classified elsewhere: Secondary | ICD-10-CM | POA: Diagnosis not present

## 2021-08-02 DIAGNOSIS — Z885 Allergy status to narcotic agent status: Secondary | ICD-10-CM | POA: Diagnosis not present

## 2021-08-02 DIAGNOSIS — R296 Repeated falls: Secondary | ICD-10-CM | POA: Diagnosis not present

## 2021-08-02 DIAGNOSIS — I4811 Longstanding persistent atrial fibrillation: Secondary | ICD-10-CM | POA: Diagnosis not present

## 2021-08-02 DIAGNOSIS — Z952 Presence of prosthetic heart valve: Secondary | ICD-10-CM | POA: Diagnosis not present

## 2021-08-02 DIAGNOSIS — Z853 Personal history of malignant neoplasm of breast: Secondary | ICD-10-CM | POA: Diagnosis not present

## 2021-08-03 DIAGNOSIS — Z950 Presence of cardiac pacemaker: Secondary | ICD-10-CM | POA: Diagnosis not present

## 2021-08-03 DIAGNOSIS — N1832 Chronic kidney disease, stage 3b: Secondary | ICD-10-CM | POA: Diagnosis not present

## 2021-08-03 DIAGNOSIS — E1165 Type 2 diabetes mellitus with hyperglycemia: Secondary | ICD-10-CM | POA: Diagnosis not present

## 2021-08-03 DIAGNOSIS — Z853 Personal history of malignant neoplasm of breast: Secondary | ICD-10-CM | POA: Diagnosis not present

## 2021-08-03 DIAGNOSIS — I2510 Atherosclerotic heart disease of native coronary artery without angina pectoris: Secondary | ICD-10-CM | POA: Diagnosis not present

## 2021-08-03 DIAGNOSIS — N3000 Acute cystitis without hematuria: Secondary | ICD-10-CM | POA: Diagnosis not present

## 2021-08-03 DIAGNOSIS — B9689 Other specified bacterial agents as the cause of diseases classified elsewhere: Secondary | ICD-10-CM | POA: Diagnosis not present

## 2021-08-03 DIAGNOSIS — I5022 Chronic systolic (congestive) heart failure: Secondary | ICD-10-CM | POA: Diagnosis not present

## 2021-08-03 DIAGNOSIS — Z8673 Personal history of transient ischemic attack (TIA), and cerebral infarction without residual deficits: Secondary | ICD-10-CM | POA: Diagnosis not present

## 2021-08-03 DIAGNOSIS — R197 Diarrhea, unspecified: Secondary | ICD-10-CM | POA: Diagnosis not present

## 2021-08-03 DIAGNOSIS — Z79899 Other long term (current) drug therapy: Secondary | ICD-10-CM | POA: Diagnosis not present

## 2021-08-03 DIAGNOSIS — Z952 Presence of prosthetic heart valve: Secondary | ICD-10-CM | POA: Diagnosis not present

## 2021-08-03 DIAGNOSIS — Z87891 Personal history of nicotine dependence: Secondary | ICD-10-CM | POA: Diagnosis not present

## 2021-08-03 DIAGNOSIS — J45909 Unspecified asthma, uncomplicated: Secondary | ICD-10-CM | POA: Diagnosis not present

## 2021-08-03 DIAGNOSIS — R296 Repeated falls: Secondary | ICD-10-CM | POA: Diagnosis not present

## 2021-08-03 DIAGNOSIS — E1122 Type 2 diabetes mellitus with diabetic chronic kidney disease: Secondary | ICD-10-CM | POA: Diagnosis not present

## 2021-08-03 DIAGNOSIS — Z95818 Presence of other cardiac implants and grafts: Secondary | ICD-10-CM | POA: Diagnosis not present

## 2021-08-03 DIAGNOSIS — N179 Acute kidney failure, unspecified: Secondary | ICD-10-CM | POA: Diagnosis not present

## 2021-08-03 DIAGNOSIS — I482 Chronic atrial fibrillation, unspecified: Secondary | ICD-10-CM | POA: Diagnosis not present

## 2021-08-03 DIAGNOSIS — I130 Hypertensive heart and chronic kidney disease with heart failure and stage 1 through stage 4 chronic kidney disease, or unspecified chronic kidney disease: Secondary | ICD-10-CM | POA: Diagnosis not present

## 2021-08-03 DIAGNOSIS — E785 Hyperlipidemia, unspecified: Secondary | ICD-10-CM | POA: Diagnosis not present

## 2021-08-03 DIAGNOSIS — I4811 Longstanding persistent atrial fibrillation: Secondary | ICD-10-CM | POA: Diagnosis not present

## 2021-08-03 DIAGNOSIS — F32A Depression, unspecified: Secondary | ICD-10-CM | POA: Diagnosis not present

## 2021-08-03 DIAGNOSIS — Z885 Allergy status to narcotic agent status: Secondary | ICD-10-CM | POA: Diagnosis not present

## 2021-08-03 DIAGNOSIS — I454 Nonspecific intraventricular block: Secondary | ICD-10-CM | POA: Diagnosis not present

## 2021-08-04 DIAGNOSIS — N1832 Chronic kidney disease, stage 3b: Secondary | ICD-10-CM | POA: Diagnosis not present

## 2021-08-04 DIAGNOSIS — F32A Depression, unspecified: Secondary | ICD-10-CM | POA: Diagnosis not present

## 2021-08-04 DIAGNOSIS — E1122 Type 2 diabetes mellitus with diabetic chronic kidney disease: Secondary | ICD-10-CM | POA: Diagnosis not present

## 2021-08-04 DIAGNOSIS — N179 Acute kidney failure, unspecified: Secondary | ICD-10-CM | POA: Diagnosis not present

## 2021-08-04 DIAGNOSIS — E1165 Type 2 diabetes mellitus with hyperglycemia: Secondary | ICD-10-CM | POA: Diagnosis not present

## 2021-08-04 DIAGNOSIS — Z87891 Personal history of nicotine dependence: Secondary | ICD-10-CM | POA: Diagnosis not present

## 2021-08-04 DIAGNOSIS — I454 Nonspecific intraventricular block: Secondary | ICD-10-CM | POA: Diagnosis not present

## 2021-08-04 DIAGNOSIS — E785 Hyperlipidemia, unspecified: Secondary | ICD-10-CM | POA: Diagnosis not present

## 2021-08-04 DIAGNOSIS — I482 Chronic atrial fibrillation, unspecified: Secondary | ICD-10-CM | POA: Diagnosis not present

## 2021-08-04 DIAGNOSIS — Z952 Presence of prosthetic heart valve: Secondary | ICD-10-CM | POA: Diagnosis not present

## 2021-08-04 DIAGNOSIS — Z853 Personal history of malignant neoplasm of breast: Secondary | ICD-10-CM | POA: Diagnosis not present

## 2021-08-04 DIAGNOSIS — R296 Repeated falls: Secondary | ICD-10-CM | POA: Diagnosis not present

## 2021-08-04 DIAGNOSIS — I2510 Atherosclerotic heart disease of native coronary artery without angina pectoris: Secondary | ICD-10-CM | POA: Diagnosis not present

## 2021-08-04 DIAGNOSIS — I130 Hypertensive heart and chronic kidney disease with heart failure and stage 1 through stage 4 chronic kidney disease, or unspecified chronic kidney disease: Secondary | ICD-10-CM | POA: Diagnosis not present

## 2021-08-04 DIAGNOSIS — Z950 Presence of cardiac pacemaker: Secondary | ICD-10-CM | POA: Diagnosis not present

## 2021-08-04 DIAGNOSIS — B9689 Other specified bacterial agents as the cause of diseases classified elsewhere: Secondary | ICD-10-CM | POA: Diagnosis not present

## 2021-08-04 DIAGNOSIS — Z95818 Presence of other cardiac implants and grafts: Secondary | ICD-10-CM | POA: Diagnosis not present

## 2021-08-04 DIAGNOSIS — Z885 Allergy status to narcotic agent status: Secondary | ICD-10-CM | POA: Diagnosis not present

## 2021-08-04 DIAGNOSIS — I4811 Longstanding persistent atrial fibrillation: Secondary | ICD-10-CM | POA: Diagnosis not present

## 2021-08-04 DIAGNOSIS — Z8673 Personal history of transient ischemic attack (TIA), and cerebral infarction without residual deficits: Secondary | ICD-10-CM | POA: Diagnosis not present

## 2021-08-04 DIAGNOSIS — R197 Diarrhea, unspecified: Secondary | ICD-10-CM | POA: Diagnosis not present

## 2021-08-04 DIAGNOSIS — N3000 Acute cystitis without hematuria: Secondary | ICD-10-CM | POA: Diagnosis not present

## 2021-08-04 DIAGNOSIS — J45909 Unspecified asthma, uncomplicated: Secondary | ICD-10-CM | POA: Diagnosis not present

## 2021-08-04 DIAGNOSIS — Z79899 Other long term (current) drug therapy: Secondary | ICD-10-CM | POA: Diagnosis not present

## 2021-08-04 DIAGNOSIS — I5022 Chronic systolic (congestive) heart failure: Secondary | ICD-10-CM | POA: Diagnosis not present

## 2021-08-05 DIAGNOSIS — Z950 Presence of cardiac pacemaker: Secondary | ICD-10-CM | POA: Diagnosis not present

## 2021-08-05 DIAGNOSIS — N1832 Chronic kidney disease, stage 3b: Secondary | ICD-10-CM | POA: Diagnosis not present

## 2021-08-05 DIAGNOSIS — E785 Hyperlipidemia, unspecified: Secondary | ICD-10-CM | POA: Diagnosis not present

## 2021-08-05 DIAGNOSIS — I5022 Chronic systolic (congestive) heart failure: Secondary | ICD-10-CM | POA: Diagnosis not present

## 2021-08-05 DIAGNOSIS — I482 Chronic atrial fibrillation, unspecified: Secondary | ICD-10-CM | POA: Diagnosis not present

## 2021-08-05 DIAGNOSIS — I454 Nonspecific intraventricular block: Secondary | ICD-10-CM | POA: Diagnosis not present

## 2021-08-05 DIAGNOSIS — Z79899 Other long term (current) drug therapy: Secondary | ICD-10-CM | POA: Diagnosis not present

## 2021-08-05 DIAGNOSIS — E1165 Type 2 diabetes mellitus with hyperglycemia: Secondary | ICD-10-CM | POA: Diagnosis not present

## 2021-08-05 DIAGNOSIS — Z95818 Presence of other cardiac implants and grafts: Secondary | ICD-10-CM | POA: Diagnosis not present

## 2021-08-05 DIAGNOSIS — R197 Diarrhea, unspecified: Secondary | ICD-10-CM | POA: Diagnosis not present

## 2021-08-05 DIAGNOSIS — F32A Depression, unspecified: Secondary | ICD-10-CM | POA: Diagnosis not present

## 2021-08-05 DIAGNOSIS — I4811 Longstanding persistent atrial fibrillation: Secondary | ICD-10-CM | POA: Diagnosis not present

## 2021-08-05 DIAGNOSIS — Z87891 Personal history of nicotine dependence: Secondary | ICD-10-CM | POA: Diagnosis not present

## 2021-08-05 DIAGNOSIS — I2510 Atherosclerotic heart disease of native coronary artery without angina pectoris: Secondary | ICD-10-CM | POA: Diagnosis not present

## 2021-08-05 DIAGNOSIS — E1122 Type 2 diabetes mellitus with diabetic chronic kidney disease: Secondary | ICD-10-CM | POA: Diagnosis not present

## 2021-08-05 DIAGNOSIS — Z952 Presence of prosthetic heart valve: Secondary | ICD-10-CM | POA: Diagnosis not present

## 2021-08-05 DIAGNOSIS — N3000 Acute cystitis without hematuria: Secondary | ICD-10-CM | POA: Diagnosis not present

## 2021-08-05 DIAGNOSIS — Z853 Personal history of malignant neoplasm of breast: Secondary | ICD-10-CM | POA: Diagnosis not present

## 2021-08-05 DIAGNOSIS — B9689 Other specified bacterial agents as the cause of diseases classified elsewhere: Secondary | ICD-10-CM | POA: Diagnosis not present

## 2021-08-05 DIAGNOSIS — I130 Hypertensive heart and chronic kidney disease with heart failure and stage 1 through stage 4 chronic kidney disease, or unspecified chronic kidney disease: Secondary | ICD-10-CM | POA: Diagnosis not present

## 2021-08-05 DIAGNOSIS — N179 Acute kidney failure, unspecified: Secondary | ICD-10-CM | POA: Diagnosis not present

## 2021-08-05 DIAGNOSIS — Z8673 Personal history of transient ischemic attack (TIA), and cerebral infarction without residual deficits: Secondary | ICD-10-CM | POA: Diagnosis not present

## 2021-08-05 DIAGNOSIS — J45909 Unspecified asthma, uncomplicated: Secondary | ICD-10-CM | POA: Diagnosis not present

## 2021-08-05 DIAGNOSIS — R296 Repeated falls: Secondary | ICD-10-CM | POA: Diagnosis not present

## 2021-08-05 DIAGNOSIS — Z885 Allergy status to narcotic agent status: Secondary | ICD-10-CM | POA: Diagnosis not present

## 2021-08-06 DIAGNOSIS — I4811 Longstanding persistent atrial fibrillation: Secondary | ICD-10-CM | POA: Diagnosis not present

## 2021-08-06 DIAGNOSIS — E785 Hyperlipidemia, unspecified: Secondary | ICD-10-CM | POA: Diagnosis not present

## 2021-08-06 DIAGNOSIS — B9689 Other specified bacterial agents as the cause of diseases classified elsewhere: Secondary | ICD-10-CM | POA: Diagnosis not present

## 2021-08-06 DIAGNOSIS — I454 Nonspecific intraventricular block: Secondary | ICD-10-CM | POA: Diagnosis not present

## 2021-08-06 DIAGNOSIS — I2510 Atherosclerotic heart disease of native coronary artery without angina pectoris: Secondary | ICD-10-CM | POA: Diagnosis not present

## 2021-08-06 DIAGNOSIS — N3000 Acute cystitis without hematuria: Secondary | ICD-10-CM | POA: Diagnosis not present

## 2021-08-06 DIAGNOSIS — Z87891 Personal history of nicotine dependence: Secondary | ICD-10-CM | POA: Diagnosis not present

## 2021-08-06 DIAGNOSIS — R5381 Other malaise: Secondary | ICD-10-CM | POA: Diagnosis not present

## 2021-08-06 DIAGNOSIS — N179 Acute kidney failure, unspecified: Secondary | ICD-10-CM | POA: Diagnosis not present

## 2021-08-06 DIAGNOSIS — N1832 Chronic kidney disease, stage 3b: Secondary | ICD-10-CM | POA: Diagnosis not present

## 2021-08-06 DIAGNOSIS — F32A Depression, unspecified: Secondary | ICD-10-CM | POA: Diagnosis not present

## 2021-08-06 DIAGNOSIS — E1165 Type 2 diabetes mellitus with hyperglycemia: Secondary | ICD-10-CM | POA: Diagnosis not present

## 2021-08-06 DIAGNOSIS — Z79899 Other long term (current) drug therapy: Secondary | ICD-10-CM | POA: Diagnosis not present

## 2021-08-06 DIAGNOSIS — E1122 Type 2 diabetes mellitus with diabetic chronic kidney disease: Secondary | ICD-10-CM | POA: Diagnosis not present

## 2021-08-06 DIAGNOSIS — Z853 Personal history of malignant neoplasm of breast: Secondary | ICD-10-CM | POA: Diagnosis not present

## 2021-08-06 DIAGNOSIS — R296 Repeated falls: Secondary | ICD-10-CM | POA: Diagnosis not present

## 2021-08-06 DIAGNOSIS — Z95818 Presence of other cardiac implants and grafts: Secondary | ICD-10-CM | POA: Diagnosis not present

## 2021-08-06 DIAGNOSIS — J45909 Unspecified asthma, uncomplicated: Secondary | ICD-10-CM | POA: Diagnosis not present

## 2021-08-06 DIAGNOSIS — Z950 Presence of cardiac pacemaker: Secondary | ICD-10-CM | POA: Diagnosis not present

## 2021-08-06 DIAGNOSIS — R531 Weakness: Secondary | ICD-10-CM | POA: Diagnosis not present

## 2021-08-06 DIAGNOSIS — I10 Essential (primary) hypertension: Secondary | ICD-10-CM | POA: Diagnosis not present

## 2021-08-06 DIAGNOSIS — Z8673 Personal history of transient ischemic attack (TIA), and cerebral infarction without residual deficits: Secondary | ICD-10-CM | POA: Diagnosis not present

## 2021-08-06 DIAGNOSIS — Z885 Allergy status to narcotic agent status: Secondary | ICD-10-CM | POA: Diagnosis not present

## 2021-08-06 DIAGNOSIS — R197 Diarrhea, unspecified: Secondary | ICD-10-CM | POA: Diagnosis not present

## 2021-08-06 DIAGNOSIS — I5022 Chronic systolic (congestive) heart failure: Secondary | ICD-10-CM | POA: Diagnosis not present

## 2021-08-06 DIAGNOSIS — I130 Hypertensive heart and chronic kidney disease with heart failure and stage 1 through stage 4 chronic kidney disease, or unspecified chronic kidney disease: Secondary | ICD-10-CM | POA: Diagnosis not present

## 2021-08-06 DIAGNOSIS — Z952 Presence of prosthetic heart valve: Secondary | ICD-10-CM | POA: Diagnosis not present

## 2021-08-07 DIAGNOSIS — E1165 Type 2 diabetes mellitus with hyperglycemia: Secondary | ICD-10-CM | POA: Diagnosis not present

## 2021-08-07 DIAGNOSIS — Z79899 Other long term (current) drug therapy: Secondary | ICD-10-CM | POA: Diagnosis not present

## 2021-08-07 DIAGNOSIS — B9689 Other specified bacterial agents as the cause of diseases classified elsewhere: Secondary | ICD-10-CM | POA: Diagnosis not present

## 2021-08-07 DIAGNOSIS — E1122 Type 2 diabetes mellitus with diabetic chronic kidney disease: Secondary | ICD-10-CM | POA: Diagnosis not present

## 2021-08-07 DIAGNOSIS — E785 Hyperlipidemia, unspecified: Secondary | ICD-10-CM | POA: Diagnosis not present

## 2021-08-07 DIAGNOSIS — F32A Depression, unspecified: Secondary | ICD-10-CM | POA: Diagnosis not present

## 2021-08-07 DIAGNOSIS — Z952 Presence of prosthetic heart valve: Secondary | ICD-10-CM | POA: Diagnosis not present

## 2021-08-07 DIAGNOSIS — Z8673 Personal history of transient ischemic attack (TIA), and cerebral infarction without residual deficits: Secondary | ICD-10-CM | POA: Diagnosis not present

## 2021-08-07 DIAGNOSIS — N179 Acute kidney failure, unspecified: Secondary | ICD-10-CM | POA: Diagnosis not present

## 2021-08-07 DIAGNOSIS — R531 Weakness: Secondary | ICD-10-CM | POA: Diagnosis not present

## 2021-08-07 DIAGNOSIS — I2510 Atherosclerotic heart disease of native coronary artery without angina pectoris: Secondary | ICD-10-CM | POA: Diagnosis not present

## 2021-08-07 DIAGNOSIS — N3000 Acute cystitis without hematuria: Secondary | ICD-10-CM | POA: Diagnosis not present

## 2021-08-07 DIAGNOSIS — R296 Repeated falls: Secondary | ICD-10-CM | POA: Diagnosis not present

## 2021-08-07 DIAGNOSIS — I130 Hypertensive heart and chronic kidney disease with heart failure and stage 1 through stage 4 chronic kidney disease, or unspecified chronic kidney disease: Secondary | ICD-10-CM | POA: Diagnosis not present

## 2021-08-07 DIAGNOSIS — R197 Diarrhea, unspecified: Secondary | ICD-10-CM | POA: Diagnosis not present

## 2021-08-07 DIAGNOSIS — I454 Nonspecific intraventricular block: Secondary | ICD-10-CM | POA: Diagnosis not present

## 2021-08-07 DIAGNOSIS — N1832 Chronic kidney disease, stage 3b: Secondary | ICD-10-CM | POA: Diagnosis not present

## 2021-08-07 DIAGNOSIS — J45909 Unspecified asthma, uncomplicated: Secondary | ICD-10-CM | POA: Diagnosis not present

## 2021-08-07 DIAGNOSIS — I5022 Chronic systolic (congestive) heart failure: Secondary | ICD-10-CM | POA: Diagnosis not present

## 2021-08-07 DIAGNOSIS — Z950 Presence of cardiac pacemaker: Secondary | ICD-10-CM | POA: Diagnosis not present

## 2021-08-07 DIAGNOSIS — Z853 Personal history of malignant neoplasm of breast: Secondary | ICD-10-CM | POA: Diagnosis not present

## 2021-08-07 DIAGNOSIS — Z885 Allergy status to narcotic agent status: Secondary | ICD-10-CM | POA: Diagnosis not present

## 2021-08-07 DIAGNOSIS — I10 Essential (primary) hypertension: Secondary | ICD-10-CM | POA: Diagnosis not present

## 2021-08-07 DIAGNOSIS — I4811 Longstanding persistent atrial fibrillation: Secondary | ICD-10-CM | POA: Diagnosis not present

## 2021-08-07 DIAGNOSIS — Z95818 Presence of other cardiac implants and grafts: Secondary | ICD-10-CM | POA: Diagnosis not present

## 2021-08-07 DIAGNOSIS — R5381 Other malaise: Secondary | ICD-10-CM | POA: Diagnosis not present

## 2021-08-07 DIAGNOSIS — Z87891 Personal history of nicotine dependence: Secondary | ICD-10-CM | POA: Diagnosis not present

## 2021-08-08 DIAGNOSIS — Z950 Presence of cardiac pacemaker: Secondary | ICD-10-CM | POA: Diagnosis not present

## 2021-08-08 DIAGNOSIS — R197 Diarrhea, unspecified: Secondary | ICD-10-CM | POA: Diagnosis not present

## 2021-08-08 DIAGNOSIS — N3000 Acute cystitis without hematuria: Secondary | ICD-10-CM | POA: Diagnosis not present

## 2021-08-08 DIAGNOSIS — I4811 Longstanding persistent atrial fibrillation: Secondary | ICD-10-CM | POA: Diagnosis not present

## 2021-08-08 DIAGNOSIS — B9689 Other specified bacterial agents as the cause of diseases classified elsewhere: Secondary | ICD-10-CM | POA: Diagnosis not present

## 2021-08-08 DIAGNOSIS — I2510 Atherosclerotic heart disease of native coronary artery without angina pectoris: Secondary | ICD-10-CM | POA: Diagnosis not present

## 2021-08-08 DIAGNOSIS — I130 Hypertensive heart and chronic kidney disease with heart failure and stage 1 through stage 4 chronic kidney disease, or unspecified chronic kidney disease: Secondary | ICD-10-CM | POA: Diagnosis not present

## 2021-08-08 DIAGNOSIS — N179 Acute kidney failure, unspecified: Secondary | ICD-10-CM | POA: Diagnosis not present

## 2021-08-08 DIAGNOSIS — E1165 Type 2 diabetes mellitus with hyperglycemia: Secondary | ICD-10-CM | POA: Diagnosis not present

## 2021-08-08 DIAGNOSIS — I5022 Chronic systolic (congestive) heart failure: Secondary | ICD-10-CM | POA: Diagnosis not present

## 2021-08-08 DIAGNOSIS — Z8673 Personal history of transient ischemic attack (TIA), and cerebral infarction without residual deficits: Secondary | ICD-10-CM | POA: Diagnosis not present

## 2021-08-08 DIAGNOSIS — I454 Nonspecific intraventricular block: Secondary | ICD-10-CM | POA: Diagnosis not present

## 2021-08-08 DIAGNOSIS — E1122 Type 2 diabetes mellitus with diabetic chronic kidney disease: Secondary | ICD-10-CM | POA: Diagnosis not present

## 2021-08-08 DIAGNOSIS — Z885 Allergy status to narcotic agent status: Secondary | ICD-10-CM | POA: Diagnosis not present

## 2021-08-08 DIAGNOSIS — Z79899 Other long term (current) drug therapy: Secondary | ICD-10-CM | POA: Diagnosis not present

## 2021-08-08 DIAGNOSIS — I10 Essential (primary) hypertension: Secondary | ICD-10-CM | POA: Diagnosis not present

## 2021-08-08 DIAGNOSIS — F32A Depression, unspecified: Secondary | ICD-10-CM | POA: Diagnosis not present

## 2021-08-08 DIAGNOSIS — N1832 Chronic kidney disease, stage 3b: Secondary | ICD-10-CM | POA: Diagnosis not present

## 2021-08-08 DIAGNOSIS — Z952 Presence of prosthetic heart valve: Secondary | ICD-10-CM | POA: Diagnosis not present

## 2021-08-08 DIAGNOSIS — R531 Weakness: Secondary | ICD-10-CM | POA: Diagnosis not present

## 2021-08-08 DIAGNOSIS — Z87891 Personal history of nicotine dependence: Secondary | ICD-10-CM | POA: Diagnosis not present

## 2021-08-08 DIAGNOSIS — E785 Hyperlipidemia, unspecified: Secondary | ICD-10-CM | POA: Diagnosis not present

## 2021-08-08 DIAGNOSIS — Z95818 Presence of other cardiac implants and grafts: Secondary | ICD-10-CM | POA: Diagnosis not present

## 2021-08-08 DIAGNOSIS — Z853 Personal history of malignant neoplasm of breast: Secondary | ICD-10-CM | POA: Diagnosis not present

## 2021-08-08 DIAGNOSIS — J45909 Unspecified asthma, uncomplicated: Secondary | ICD-10-CM | POA: Diagnosis not present

## 2021-08-08 DIAGNOSIS — R5381 Other malaise: Secondary | ICD-10-CM | POA: Diagnosis not present

## 2021-08-08 DIAGNOSIS — R296 Repeated falls: Secondary | ICD-10-CM | POA: Diagnosis not present

## 2021-08-09 DIAGNOSIS — N3000 Acute cystitis without hematuria: Secondary | ICD-10-CM | POA: Diagnosis not present

## 2021-08-09 DIAGNOSIS — I2510 Atherosclerotic heart disease of native coronary artery without angina pectoris: Secondary | ICD-10-CM | POA: Diagnosis not present

## 2021-08-09 DIAGNOSIS — I130 Hypertensive heart and chronic kidney disease with heart failure and stage 1 through stage 4 chronic kidney disease, or unspecified chronic kidney disease: Secondary | ICD-10-CM | POA: Diagnosis not present

## 2021-08-09 DIAGNOSIS — E785 Hyperlipidemia, unspecified: Secondary | ICD-10-CM | POA: Diagnosis not present

## 2021-08-09 DIAGNOSIS — Z79899 Other long term (current) drug therapy: Secondary | ICD-10-CM | POA: Diagnosis not present

## 2021-08-09 DIAGNOSIS — I454 Nonspecific intraventricular block: Secondary | ICD-10-CM | POA: Diagnosis not present

## 2021-08-09 DIAGNOSIS — Z8673 Personal history of transient ischemic attack (TIA), and cerebral infarction without residual deficits: Secondary | ICD-10-CM | POA: Diagnosis not present

## 2021-08-09 DIAGNOSIS — Z95818 Presence of other cardiac implants and grafts: Secondary | ICD-10-CM | POA: Diagnosis not present

## 2021-08-09 DIAGNOSIS — R5381 Other malaise: Secondary | ICD-10-CM | POA: Diagnosis not present

## 2021-08-09 DIAGNOSIS — I10 Essential (primary) hypertension: Secondary | ICD-10-CM | POA: Diagnosis not present

## 2021-08-09 DIAGNOSIS — Z87891 Personal history of nicotine dependence: Secondary | ICD-10-CM | POA: Diagnosis not present

## 2021-08-09 DIAGNOSIS — I5022 Chronic systolic (congestive) heart failure: Secondary | ICD-10-CM | POA: Diagnosis not present

## 2021-08-09 DIAGNOSIS — Z885 Allergy status to narcotic agent status: Secondary | ICD-10-CM | POA: Diagnosis not present

## 2021-08-09 DIAGNOSIS — Z853 Personal history of malignant neoplasm of breast: Secondary | ICD-10-CM | POA: Diagnosis not present

## 2021-08-09 DIAGNOSIS — R197 Diarrhea, unspecified: Secondary | ICD-10-CM | POA: Diagnosis not present

## 2021-08-09 DIAGNOSIS — R531 Weakness: Secondary | ICD-10-CM | POA: Diagnosis not present

## 2021-08-09 DIAGNOSIS — Z952 Presence of prosthetic heart valve: Secondary | ICD-10-CM | POA: Diagnosis not present

## 2021-08-09 DIAGNOSIS — E1165 Type 2 diabetes mellitus with hyperglycemia: Secondary | ICD-10-CM | POA: Diagnosis not present

## 2021-08-09 DIAGNOSIS — N1832 Chronic kidney disease, stage 3b: Secondary | ICD-10-CM | POA: Diagnosis not present

## 2021-08-09 DIAGNOSIS — N179 Acute kidney failure, unspecified: Secondary | ICD-10-CM | POA: Diagnosis not present

## 2021-08-09 DIAGNOSIS — R296 Repeated falls: Secondary | ICD-10-CM | POA: Diagnosis not present

## 2021-08-09 DIAGNOSIS — B9689 Other specified bacterial agents as the cause of diseases classified elsewhere: Secondary | ICD-10-CM | POA: Diagnosis not present

## 2021-08-09 DIAGNOSIS — I4811 Longstanding persistent atrial fibrillation: Secondary | ICD-10-CM | POA: Diagnosis not present

## 2021-08-09 DIAGNOSIS — F32A Depression, unspecified: Secondary | ICD-10-CM | POA: Diagnosis not present

## 2021-08-09 DIAGNOSIS — J45909 Unspecified asthma, uncomplicated: Secondary | ICD-10-CM | POA: Diagnosis not present

## 2021-08-09 DIAGNOSIS — E1122 Type 2 diabetes mellitus with diabetic chronic kidney disease: Secondary | ICD-10-CM | POA: Diagnosis not present

## 2021-08-09 DIAGNOSIS — Z950 Presence of cardiac pacemaker: Secondary | ICD-10-CM | POA: Diagnosis not present

## 2021-08-10 DIAGNOSIS — Z853 Personal history of malignant neoplasm of breast: Secondary | ICD-10-CM | POA: Diagnosis not present

## 2021-08-10 DIAGNOSIS — I4811 Longstanding persistent atrial fibrillation: Secondary | ICD-10-CM | POA: Diagnosis not present

## 2021-08-10 DIAGNOSIS — B9689 Other specified bacterial agents as the cause of diseases classified elsewhere: Secondary | ICD-10-CM | POA: Diagnosis not present

## 2021-08-10 DIAGNOSIS — E1122 Type 2 diabetes mellitus with diabetic chronic kidney disease: Secondary | ICD-10-CM | POA: Diagnosis not present

## 2021-08-10 DIAGNOSIS — R296 Repeated falls: Secondary | ICD-10-CM | POA: Diagnosis not present

## 2021-08-10 DIAGNOSIS — Z79899 Other long term (current) drug therapy: Secondary | ICD-10-CM | POA: Diagnosis not present

## 2021-08-10 DIAGNOSIS — I454 Nonspecific intraventricular block: Secondary | ICD-10-CM | POA: Diagnosis not present

## 2021-08-10 DIAGNOSIS — Z885 Allergy status to narcotic agent status: Secondary | ICD-10-CM | POA: Diagnosis not present

## 2021-08-10 DIAGNOSIS — N1832 Chronic kidney disease, stage 3b: Secondary | ICD-10-CM | POA: Diagnosis not present

## 2021-08-10 DIAGNOSIS — Z95818 Presence of other cardiac implants and grafts: Secondary | ICD-10-CM | POA: Diagnosis not present

## 2021-08-10 DIAGNOSIS — I130 Hypertensive heart and chronic kidney disease with heart failure and stage 1 through stage 4 chronic kidney disease, or unspecified chronic kidney disease: Secondary | ICD-10-CM | POA: Diagnosis not present

## 2021-08-10 DIAGNOSIS — R5381 Other malaise: Secondary | ICD-10-CM | POA: Diagnosis not present

## 2021-08-10 DIAGNOSIS — Z87891 Personal history of nicotine dependence: Secondary | ICD-10-CM | POA: Diagnosis not present

## 2021-08-10 DIAGNOSIS — Z8673 Personal history of transient ischemic attack (TIA), and cerebral infarction without residual deficits: Secondary | ICD-10-CM | POA: Diagnosis not present

## 2021-08-10 DIAGNOSIS — R197 Diarrhea, unspecified: Secondary | ICD-10-CM | POA: Diagnosis not present

## 2021-08-10 DIAGNOSIS — N3000 Acute cystitis without hematuria: Secondary | ICD-10-CM | POA: Diagnosis not present

## 2021-08-10 DIAGNOSIS — I5022 Chronic systolic (congestive) heart failure: Secondary | ICD-10-CM | POA: Diagnosis not present

## 2021-08-10 DIAGNOSIS — Z952 Presence of prosthetic heart valve: Secondary | ICD-10-CM | POA: Diagnosis not present

## 2021-08-10 DIAGNOSIS — E1165 Type 2 diabetes mellitus with hyperglycemia: Secondary | ICD-10-CM | POA: Diagnosis not present

## 2021-08-10 DIAGNOSIS — J45909 Unspecified asthma, uncomplicated: Secondary | ICD-10-CM | POA: Diagnosis not present

## 2021-08-10 DIAGNOSIS — Z950 Presence of cardiac pacemaker: Secondary | ICD-10-CM | POA: Diagnosis not present

## 2021-08-10 DIAGNOSIS — R531 Weakness: Secondary | ICD-10-CM | POA: Diagnosis not present

## 2021-08-10 DIAGNOSIS — F32A Depression, unspecified: Secondary | ICD-10-CM | POA: Diagnosis not present

## 2021-08-10 DIAGNOSIS — I10 Essential (primary) hypertension: Secondary | ICD-10-CM | POA: Diagnosis not present

## 2021-08-10 DIAGNOSIS — I2510 Atherosclerotic heart disease of native coronary artery without angina pectoris: Secondary | ICD-10-CM | POA: Diagnosis not present

## 2021-08-10 DIAGNOSIS — E785 Hyperlipidemia, unspecified: Secondary | ICD-10-CM | POA: Diagnosis not present

## 2021-08-10 DIAGNOSIS — N179 Acute kidney failure, unspecified: Secondary | ICD-10-CM | POA: Diagnosis not present

## 2021-08-11 DIAGNOSIS — Z95818 Presence of other cardiac implants and grafts: Secondary | ICD-10-CM | POA: Diagnosis not present

## 2021-08-11 DIAGNOSIS — I5022 Chronic systolic (congestive) heart failure: Secondary | ICD-10-CM | POA: Diagnosis not present

## 2021-08-11 DIAGNOSIS — Z79899 Other long term (current) drug therapy: Secondary | ICD-10-CM | POA: Diagnosis not present

## 2021-08-11 DIAGNOSIS — I130 Hypertensive heart and chronic kidney disease with heart failure and stage 1 through stage 4 chronic kidney disease, or unspecified chronic kidney disease: Secondary | ICD-10-CM | POA: Diagnosis not present

## 2021-08-11 DIAGNOSIS — Z8673 Personal history of transient ischemic attack (TIA), and cerebral infarction without residual deficits: Secondary | ICD-10-CM | POA: Diagnosis not present

## 2021-08-11 DIAGNOSIS — I10 Essential (primary) hypertension: Secondary | ICD-10-CM | POA: Diagnosis not present

## 2021-08-11 DIAGNOSIS — R296 Repeated falls: Secondary | ICD-10-CM | POA: Diagnosis not present

## 2021-08-11 DIAGNOSIS — E1165 Type 2 diabetes mellitus with hyperglycemia: Secondary | ICD-10-CM | POA: Diagnosis not present

## 2021-08-11 DIAGNOSIS — Z885 Allergy status to narcotic agent status: Secondary | ICD-10-CM | POA: Diagnosis not present

## 2021-08-11 DIAGNOSIS — Z952 Presence of prosthetic heart valve: Secondary | ICD-10-CM | POA: Diagnosis not present

## 2021-08-11 DIAGNOSIS — E785 Hyperlipidemia, unspecified: Secondary | ICD-10-CM | POA: Diagnosis not present

## 2021-08-11 DIAGNOSIS — I4811 Longstanding persistent atrial fibrillation: Secondary | ICD-10-CM | POA: Diagnosis not present

## 2021-08-11 DIAGNOSIS — R531 Weakness: Secondary | ICD-10-CM | POA: Diagnosis not present

## 2021-08-11 DIAGNOSIS — I2510 Atherosclerotic heart disease of native coronary artery without angina pectoris: Secondary | ICD-10-CM | POA: Diagnosis not present

## 2021-08-11 DIAGNOSIS — I454 Nonspecific intraventricular block: Secondary | ICD-10-CM | POA: Diagnosis not present

## 2021-08-11 DIAGNOSIS — N3000 Acute cystitis without hematuria: Secondary | ICD-10-CM | POA: Diagnosis not present

## 2021-08-11 DIAGNOSIS — F32A Depression, unspecified: Secondary | ICD-10-CM | POA: Diagnosis not present

## 2021-08-11 DIAGNOSIS — N1832 Chronic kidney disease, stage 3b: Secondary | ICD-10-CM | POA: Diagnosis not present

## 2021-08-11 DIAGNOSIS — Z950 Presence of cardiac pacemaker: Secondary | ICD-10-CM | POA: Diagnosis not present

## 2021-08-11 DIAGNOSIS — J45909 Unspecified asthma, uncomplicated: Secondary | ICD-10-CM | POA: Diagnosis not present

## 2021-08-11 DIAGNOSIS — N179 Acute kidney failure, unspecified: Secondary | ICD-10-CM | POA: Diagnosis not present

## 2021-08-11 DIAGNOSIS — R197 Diarrhea, unspecified: Secondary | ICD-10-CM | POA: Diagnosis not present

## 2021-08-11 DIAGNOSIS — B9689 Other specified bacterial agents as the cause of diseases classified elsewhere: Secondary | ICD-10-CM | POA: Diagnosis not present

## 2021-08-11 DIAGNOSIS — E1122 Type 2 diabetes mellitus with diabetic chronic kidney disease: Secondary | ICD-10-CM | POA: Diagnosis not present

## 2021-08-11 DIAGNOSIS — R5381 Other malaise: Secondary | ICD-10-CM | POA: Diagnosis not present

## 2021-08-11 DIAGNOSIS — Z87891 Personal history of nicotine dependence: Secondary | ICD-10-CM | POA: Diagnosis not present

## 2021-08-11 DIAGNOSIS — Z853 Personal history of malignant neoplasm of breast: Secondary | ICD-10-CM | POA: Diagnosis not present

## 2021-08-12 DIAGNOSIS — I10 Essential (primary) hypertension: Secondary | ICD-10-CM | POA: Diagnosis not present

## 2021-08-12 DIAGNOSIS — Z952 Presence of prosthetic heart valve: Secondary | ICD-10-CM | POA: Diagnosis not present

## 2021-08-12 DIAGNOSIS — Z950 Presence of cardiac pacemaker: Secondary | ICD-10-CM | POA: Diagnosis not present

## 2021-08-12 DIAGNOSIS — Z885 Allergy status to narcotic agent status: Secondary | ICD-10-CM | POA: Diagnosis not present

## 2021-08-12 DIAGNOSIS — E119 Type 2 diabetes mellitus without complications: Secondary | ICD-10-CM | POA: Diagnosis not present

## 2021-08-12 DIAGNOSIS — E7800 Pure hypercholesterolemia, unspecified: Secondary | ICD-10-CM | POA: Diagnosis not present

## 2021-08-12 DIAGNOSIS — I4811 Longstanding persistent atrial fibrillation: Secondary | ICD-10-CM | POA: Diagnosis not present

## 2021-08-12 DIAGNOSIS — R296 Repeated falls: Secondary | ICD-10-CM | POA: Diagnosis not present

## 2021-08-12 DIAGNOSIS — Z8673 Personal history of transient ischemic attack (TIA), and cerebral infarction without residual deficits: Secondary | ICD-10-CM | POA: Diagnosis not present

## 2021-08-12 DIAGNOSIS — R7303 Prediabetes: Secondary | ICD-10-CM | POA: Diagnosis not present

## 2021-08-12 DIAGNOSIS — I2510 Atherosclerotic heart disease of native coronary artery without angina pectoris: Secondary | ICD-10-CM | POA: Diagnosis not present

## 2021-08-12 DIAGNOSIS — I255 Ischemic cardiomyopathy: Secondary | ICD-10-CM | POA: Diagnosis not present

## 2021-08-12 DIAGNOSIS — R531 Weakness: Secondary | ICD-10-CM | POA: Diagnosis not present

## 2021-08-12 DIAGNOSIS — N3000 Acute cystitis without hematuria: Secondary | ICD-10-CM | POA: Diagnosis not present

## 2021-08-12 DIAGNOSIS — E039 Hypothyroidism, unspecified: Secondary | ICD-10-CM | POA: Diagnosis not present

## 2021-08-12 DIAGNOSIS — N189 Chronic kidney disease, unspecified: Secondary | ICD-10-CM | POA: Diagnosis not present

## 2021-08-12 DIAGNOSIS — F32A Depression, unspecified: Secondary | ICD-10-CM | POA: Diagnosis not present

## 2021-08-12 DIAGNOSIS — I5022 Chronic systolic (congestive) heart failure: Secondary | ICD-10-CM | POA: Diagnosis not present

## 2021-08-12 DIAGNOSIS — J45909 Unspecified asthma, uncomplicated: Secondary | ICD-10-CM | POA: Diagnosis not present

## 2021-08-12 DIAGNOSIS — N1832 Chronic kidney disease, stage 3b: Secondary | ICD-10-CM | POA: Diagnosis not present

## 2021-08-12 DIAGNOSIS — R197 Diarrhea, unspecified: Secondary | ICD-10-CM | POA: Diagnosis not present

## 2021-08-12 DIAGNOSIS — Z853 Personal history of malignant neoplasm of breast: Secondary | ICD-10-CM | POA: Diagnosis not present

## 2021-08-12 DIAGNOSIS — R600 Localized edema: Secondary | ICD-10-CM | POA: Diagnosis not present

## 2021-08-12 DIAGNOSIS — R5381 Other malaise: Secondary | ICD-10-CM | POA: Diagnosis not present

## 2021-08-12 DIAGNOSIS — N179 Acute kidney failure, unspecified: Secondary | ICD-10-CM | POA: Diagnosis not present

## 2021-08-12 DIAGNOSIS — R42 Dizziness and giddiness: Secondary | ICD-10-CM | POA: Diagnosis not present

## 2021-08-12 DIAGNOSIS — E785 Hyperlipidemia, unspecified: Secondary | ICD-10-CM | POA: Diagnosis not present

## 2021-08-12 DIAGNOSIS — Z95818 Presence of other cardiac implants and grafts: Secondary | ICD-10-CM | POA: Diagnosis not present

## 2021-08-12 DIAGNOSIS — I130 Hypertensive heart and chronic kidney disease with heart failure and stage 1 through stage 4 chronic kidney disease, or unspecified chronic kidney disease: Secondary | ICD-10-CM | POA: Diagnosis not present

## 2021-08-12 DIAGNOSIS — E1165 Type 2 diabetes mellitus with hyperglycemia: Secondary | ICD-10-CM | POA: Diagnosis not present

## 2021-08-12 DIAGNOSIS — E1122 Type 2 diabetes mellitus with diabetic chronic kidney disease: Secondary | ICD-10-CM | POA: Diagnosis not present

## 2021-08-12 DIAGNOSIS — Z87891 Personal history of nicotine dependence: Secondary | ICD-10-CM | POA: Diagnosis not present

## 2021-08-12 DIAGNOSIS — B9689 Other specified bacterial agents as the cause of diseases classified elsewhere: Secondary | ICD-10-CM | POA: Diagnosis not present

## 2021-08-12 DIAGNOSIS — I454 Nonspecific intraventricular block: Secondary | ICD-10-CM | POA: Diagnosis not present

## 2021-08-12 DIAGNOSIS — Z79899 Other long term (current) drug therapy: Secondary | ICD-10-CM | POA: Diagnosis not present

## 2021-08-15 DIAGNOSIS — E039 Hypothyroidism, unspecified: Secondary | ICD-10-CM | POA: Diagnosis not present

## 2021-08-15 DIAGNOSIS — R600 Localized edema: Secondary | ICD-10-CM | POA: Diagnosis not present

## 2021-08-15 DIAGNOSIS — E7800 Pure hypercholesterolemia, unspecified: Secondary | ICD-10-CM | POA: Diagnosis not present

## 2021-08-15 DIAGNOSIS — R7303 Prediabetes: Secondary | ICD-10-CM | POA: Diagnosis not present

## 2021-08-19 DIAGNOSIS — N189 Chronic kidney disease, unspecified: Secondary | ICD-10-CM | POA: Diagnosis not present

## 2021-08-19 DIAGNOSIS — N3000 Acute cystitis without hematuria: Secondary | ICD-10-CM | POA: Diagnosis not present

## 2021-08-19 DIAGNOSIS — R296 Repeated falls: Secondary | ICD-10-CM | POA: Diagnosis not present

## 2021-08-22 DIAGNOSIS — R42 Dizziness and giddiness: Secondary | ICD-10-CM | POA: Diagnosis not present

## 2021-08-22 DIAGNOSIS — E119 Type 2 diabetes mellitus without complications: Secondary | ICD-10-CM | POA: Diagnosis not present

## 2021-08-22 DIAGNOSIS — R5381 Other malaise: Secondary | ICD-10-CM | POA: Diagnosis not present

## 2021-08-22 DIAGNOSIS — N189 Chronic kidney disease, unspecified: Secondary | ICD-10-CM | POA: Diagnosis not present

## 2021-08-25 DIAGNOSIS — N189 Chronic kidney disease, unspecified: Secondary | ICD-10-CM | POA: Diagnosis not present

## 2021-08-25 DIAGNOSIS — N3000 Acute cystitis without hematuria: Secondary | ICD-10-CM | POA: Diagnosis not present

## 2021-08-25 DIAGNOSIS — I10 Essential (primary) hypertension: Secondary | ICD-10-CM | POA: Diagnosis not present

## 2021-08-26 DIAGNOSIS — R296 Repeated falls: Secondary | ICD-10-CM | POA: Diagnosis not present

## 2021-08-26 DIAGNOSIS — E1122 Type 2 diabetes mellitus with diabetic chronic kidney disease: Secondary | ICD-10-CM | POA: Diagnosis not present

## 2021-08-26 DIAGNOSIS — I509 Heart failure, unspecified: Secondary | ICD-10-CM | POA: Diagnosis not present

## 2021-08-26 DIAGNOSIS — E785 Hyperlipidemia, unspecified: Secondary | ICD-10-CM | POA: Diagnosis not present

## 2021-08-26 DIAGNOSIS — Z7901 Long term (current) use of anticoagulants: Secondary | ICD-10-CM | POA: Diagnosis not present

## 2021-08-26 DIAGNOSIS — I255 Ischemic cardiomyopathy: Secondary | ICD-10-CM | POA: Diagnosis not present

## 2021-08-26 DIAGNOSIS — I4891 Unspecified atrial fibrillation: Secondary | ICD-10-CM | POA: Diagnosis not present

## 2021-08-26 DIAGNOSIS — Z955 Presence of coronary angioplasty implant and graft: Secondary | ICD-10-CM | POA: Diagnosis not present

## 2021-08-26 DIAGNOSIS — I110 Hypertensive heart disease with heart failure: Secondary | ICD-10-CM | POA: Diagnosis not present

## 2021-08-26 DIAGNOSIS — Z950 Presence of cardiac pacemaker: Secondary | ICD-10-CM | POA: Diagnosis not present

## 2021-08-30 DIAGNOSIS — I255 Ischemic cardiomyopathy: Secondary | ICD-10-CM | POA: Diagnosis not present

## 2021-08-30 DIAGNOSIS — I110 Hypertensive heart disease with heart failure: Secondary | ICD-10-CM | POA: Diagnosis not present

## 2021-08-30 DIAGNOSIS — E785 Hyperlipidemia, unspecified: Secondary | ICD-10-CM | POA: Diagnosis not present

## 2021-08-30 DIAGNOSIS — Z7901 Long term (current) use of anticoagulants: Secondary | ICD-10-CM | POA: Diagnosis not present

## 2021-08-30 DIAGNOSIS — Z950 Presence of cardiac pacemaker: Secondary | ICD-10-CM | POA: Diagnosis not present

## 2021-08-30 DIAGNOSIS — I509 Heart failure, unspecified: Secondary | ICD-10-CM | POA: Diagnosis not present

## 2021-08-30 DIAGNOSIS — E1122 Type 2 diabetes mellitus with diabetic chronic kidney disease: Secondary | ICD-10-CM | POA: Diagnosis not present

## 2021-08-30 DIAGNOSIS — Z955 Presence of coronary angioplasty implant and graft: Secondary | ICD-10-CM | POA: Diagnosis not present

## 2021-08-30 DIAGNOSIS — I4891 Unspecified atrial fibrillation: Secondary | ICD-10-CM | POA: Diagnosis not present

## 2021-08-30 DIAGNOSIS — R296 Repeated falls: Secondary | ICD-10-CM | POA: Diagnosis not present

## 2021-09-01 DIAGNOSIS — E785 Hyperlipidemia, unspecified: Secondary | ICD-10-CM | POA: Diagnosis not present

## 2021-09-01 DIAGNOSIS — I509 Heart failure, unspecified: Secondary | ICD-10-CM | POA: Diagnosis not present

## 2021-09-01 DIAGNOSIS — Z7901 Long term (current) use of anticoagulants: Secondary | ICD-10-CM | POA: Diagnosis not present

## 2021-09-01 DIAGNOSIS — I4891 Unspecified atrial fibrillation: Secondary | ICD-10-CM | POA: Diagnosis not present

## 2021-09-01 DIAGNOSIS — Z950 Presence of cardiac pacemaker: Secondary | ICD-10-CM | POA: Diagnosis not present

## 2021-09-01 DIAGNOSIS — I255 Ischemic cardiomyopathy: Secondary | ICD-10-CM | POA: Diagnosis not present

## 2021-09-01 DIAGNOSIS — R296 Repeated falls: Secondary | ICD-10-CM | POA: Diagnosis not present

## 2021-09-01 DIAGNOSIS — I110 Hypertensive heart disease with heart failure: Secondary | ICD-10-CM | POA: Diagnosis not present

## 2021-09-01 DIAGNOSIS — E1122 Type 2 diabetes mellitus with diabetic chronic kidney disease: Secondary | ICD-10-CM | POA: Diagnosis not present

## 2021-09-01 DIAGNOSIS — Z955 Presence of coronary angioplasty implant and graft: Secondary | ICD-10-CM | POA: Diagnosis not present

## 2021-09-02 DIAGNOSIS — I4891 Unspecified atrial fibrillation: Secondary | ICD-10-CM | POA: Diagnosis not present

## 2021-09-02 DIAGNOSIS — E039 Hypothyroidism, unspecified: Secondary | ICD-10-CM | POA: Diagnosis not present

## 2021-09-02 DIAGNOSIS — R296 Repeated falls: Secondary | ICD-10-CM | POA: Diagnosis not present

## 2021-09-02 DIAGNOSIS — E1122 Type 2 diabetes mellitus with diabetic chronic kidney disease: Secondary | ICD-10-CM | POA: Diagnosis not present

## 2021-09-02 DIAGNOSIS — I10 Essential (primary) hypertension: Secondary | ICD-10-CM | POA: Diagnosis not present

## 2021-09-02 DIAGNOSIS — Z955 Presence of coronary angioplasty implant and graft: Secondary | ICD-10-CM | POA: Diagnosis not present

## 2021-09-02 DIAGNOSIS — I110 Hypertensive heart disease with heart failure: Secondary | ICD-10-CM | POA: Diagnosis not present

## 2021-09-02 DIAGNOSIS — Z950 Presence of cardiac pacemaker: Secondary | ICD-10-CM | POA: Diagnosis not present

## 2021-09-02 DIAGNOSIS — Z7901 Long term (current) use of anticoagulants: Secondary | ICD-10-CM | POA: Diagnosis not present

## 2021-09-02 DIAGNOSIS — I509 Heart failure, unspecified: Secondary | ICD-10-CM | POA: Diagnosis not present

## 2021-09-02 DIAGNOSIS — E785 Hyperlipidemia, unspecified: Secondary | ICD-10-CM | POA: Diagnosis not present

## 2021-09-02 DIAGNOSIS — G894 Chronic pain syndrome: Secondary | ICD-10-CM | POA: Diagnosis not present

## 2021-09-02 DIAGNOSIS — I255 Ischemic cardiomyopathy: Secondary | ICD-10-CM | POA: Diagnosis not present

## 2021-09-04 DIAGNOSIS — Z7901 Long term (current) use of anticoagulants: Secondary | ICD-10-CM | POA: Diagnosis not present

## 2021-09-04 DIAGNOSIS — Z955 Presence of coronary angioplasty implant and graft: Secondary | ICD-10-CM | POA: Diagnosis not present

## 2021-09-04 DIAGNOSIS — I255 Ischemic cardiomyopathy: Secondary | ICD-10-CM | POA: Diagnosis not present

## 2021-09-04 DIAGNOSIS — I2510 Atherosclerotic heart disease of native coronary artery without angina pectoris: Secondary | ICD-10-CM | POA: Diagnosis not present

## 2021-09-04 DIAGNOSIS — I4819 Other persistent atrial fibrillation: Secondary | ICD-10-CM | POA: Diagnosis not present

## 2021-09-04 DIAGNOSIS — Z87440 Personal history of urinary (tract) infections: Secondary | ICD-10-CM | POA: Diagnosis not present

## 2021-09-04 DIAGNOSIS — I110 Hypertensive heart disease with heart failure: Secondary | ICD-10-CM | POA: Diagnosis not present

## 2021-09-04 DIAGNOSIS — M545 Low back pain, unspecified: Secondary | ICD-10-CM | POA: Diagnosis not present

## 2021-09-04 DIAGNOSIS — I4891 Unspecified atrial fibrillation: Secondary | ICD-10-CM | POA: Diagnosis not present

## 2021-09-04 DIAGNOSIS — E785 Hyperlipidemia, unspecified: Secondary | ICD-10-CM | POA: Diagnosis not present

## 2021-09-04 DIAGNOSIS — Z950 Presence of cardiac pacemaker: Secondary | ICD-10-CM | POA: Diagnosis not present

## 2021-09-04 DIAGNOSIS — I509 Heart failure, unspecified: Secondary | ICD-10-CM | POA: Diagnosis not present

## 2021-09-04 DIAGNOSIS — E1122 Type 2 diabetes mellitus with diabetic chronic kidney disease: Secondary | ICD-10-CM | POA: Diagnosis not present

## 2021-09-04 DIAGNOSIS — N1832 Chronic kidney disease, stage 3b: Secondary | ICD-10-CM | POA: Diagnosis not present

## 2021-09-04 DIAGNOSIS — R296 Repeated falls: Secondary | ICD-10-CM | POA: Diagnosis not present

## 2021-09-04 DIAGNOSIS — G8929 Other chronic pain: Secondary | ICD-10-CM | POA: Diagnosis not present

## 2021-09-04 DIAGNOSIS — I130 Hypertensive heart and chronic kidney disease with heart failure and stage 1 through stage 4 chronic kidney disease, or unspecified chronic kidney disease: Secondary | ICD-10-CM | POA: Diagnosis not present

## 2021-09-04 DIAGNOSIS — I5020 Unspecified systolic (congestive) heart failure: Secondary | ICD-10-CM | POA: Diagnosis not present

## 2021-09-10 DIAGNOSIS — N1832 Chronic kidney disease, stage 3b: Secondary | ICD-10-CM | POA: Diagnosis not present

## 2021-09-10 DIAGNOSIS — R296 Repeated falls: Secondary | ICD-10-CM | POA: Diagnosis not present

## 2021-09-10 DIAGNOSIS — Z87440 Personal history of urinary (tract) infections: Secondary | ICD-10-CM | POA: Diagnosis not present

## 2021-09-10 DIAGNOSIS — G8929 Other chronic pain: Secondary | ICD-10-CM | POA: Diagnosis not present

## 2021-09-10 DIAGNOSIS — I130 Hypertensive heart and chronic kidney disease with heart failure and stage 1 through stage 4 chronic kidney disease, or unspecified chronic kidney disease: Secondary | ICD-10-CM | POA: Diagnosis not present

## 2021-09-10 DIAGNOSIS — I4819 Other persistent atrial fibrillation: Secondary | ICD-10-CM | POA: Diagnosis not present

## 2021-09-10 DIAGNOSIS — M545 Low back pain, unspecified: Secondary | ICD-10-CM | POA: Diagnosis not present

## 2021-09-10 DIAGNOSIS — I255 Ischemic cardiomyopathy: Secondary | ICD-10-CM | POA: Diagnosis not present

## 2021-09-10 DIAGNOSIS — I5020 Unspecified systolic (congestive) heart failure: Secondary | ICD-10-CM | POA: Diagnosis not present

## 2021-09-10 DIAGNOSIS — I2510 Atherosclerotic heart disease of native coronary artery without angina pectoris: Secondary | ICD-10-CM | POA: Diagnosis not present

## 2021-09-10 DIAGNOSIS — E785 Hyperlipidemia, unspecified: Secondary | ICD-10-CM | POA: Diagnosis not present

## 2021-09-10 DIAGNOSIS — E1122 Type 2 diabetes mellitus with diabetic chronic kidney disease: Secondary | ICD-10-CM | POA: Diagnosis not present

## 2021-09-16 DIAGNOSIS — E1122 Type 2 diabetes mellitus with diabetic chronic kidney disease: Secondary | ICD-10-CM | POA: Diagnosis not present

## 2021-09-16 DIAGNOSIS — R296 Repeated falls: Secondary | ICD-10-CM | POA: Diagnosis not present

## 2021-09-16 DIAGNOSIS — I4819 Other persistent atrial fibrillation: Secondary | ICD-10-CM | POA: Diagnosis not present

## 2021-09-16 DIAGNOSIS — I5020 Unspecified systolic (congestive) heart failure: Secondary | ICD-10-CM | POA: Diagnosis not present

## 2021-09-16 DIAGNOSIS — I2510 Atherosclerotic heart disease of native coronary artery without angina pectoris: Secondary | ICD-10-CM | POA: Diagnosis not present

## 2021-09-16 DIAGNOSIS — I130 Hypertensive heart and chronic kidney disease with heart failure and stage 1 through stage 4 chronic kidney disease, or unspecified chronic kidney disease: Secondary | ICD-10-CM | POA: Diagnosis not present

## 2021-09-16 DIAGNOSIS — I255 Ischemic cardiomyopathy: Secondary | ICD-10-CM | POA: Diagnosis not present

## 2021-09-16 DIAGNOSIS — Z87440 Personal history of urinary (tract) infections: Secondary | ICD-10-CM | POA: Diagnosis not present

## 2021-09-16 DIAGNOSIS — M545 Low back pain, unspecified: Secondary | ICD-10-CM | POA: Diagnosis not present

## 2021-09-16 DIAGNOSIS — E785 Hyperlipidemia, unspecified: Secondary | ICD-10-CM | POA: Diagnosis not present

## 2021-09-16 DIAGNOSIS — G8929 Other chronic pain: Secondary | ICD-10-CM | POA: Diagnosis not present

## 2021-09-16 DIAGNOSIS — N1832 Chronic kidney disease, stage 3b: Secondary | ICD-10-CM | POA: Diagnosis not present

## 2021-09-18 DIAGNOSIS — E785 Hyperlipidemia, unspecified: Secondary | ICD-10-CM | POA: Diagnosis not present

## 2021-09-18 DIAGNOSIS — E1122 Type 2 diabetes mellitus with diabetic chronic kidney disease: Secondary | ICD-10-CM | POA: Diagnosis not present

## 2021-09-18 DIAGNOSIS — I2510 Atherosclerotic heart disease of native coronary artery without angina pectoris: Secondary | ICD-10-CM | POA: Diagnosis not present

## 2021-09-18 DIAGNOSIS — G8929 Other chronic pain: Secondary | ICD-10-CM | POA: Diagnosis not present

## 2021-09-18 DIAGNOSIS — N1832 Chronic kidney disease, stage 3b: Secondary | ICD-10-CM | POA: Diagnosis not present

## 2021-09-18 DIAGNOSIS — Z87440 Personal history of urinary (tract) infections: Secondary | ICD-10-CM | POA: Diagnosis not present

## 2021-09-18 DIAGNOSIS — I4819 Other persistent atrial fibrillation: Secondary | ICD-10-CM | POA: Diagnosis not present

## 2021-09-18 DIAGNOSIS — R296 Repeated falls: Secondary | ICD-10-CM | POA: Diagnosis not present

## 2021-09-18 DIAGNOSIS — I5020 Unspecified systolic (congestive) heart failure: Secondary | ICD-10-CM | POA: Diagnosis not present

## 2021-09-18 DIAGNOSIS — M545 Low back pain, unspecified: Secondary | ICD-10-CM | POA: Diagnosis not present

## 2021-09-18 DIAGNOSIS — I130 Hypertensive heart and chronic kidney disease with heart failure and stage 1 through stage 4 chronic kidney disease, or unspecified chronic kidney disease: Secondary | ICD-10-CM | POA: Diagnosis not present

## 2021-09-18 DIAGNOSIS — I255 Ischemic cardiomyopathy: Secondary | ICD-10-CM | POA: Diagnosis not present

## 2021-09-27 DIAGNOSIS — Z09 Encounter for follow-up examination after completed treatment for conditions other than malignant neoplasm: Secondary | ICD-10-CM | POA: Diagnosis not present

## 2021-09-27 DIAGNOSIS — F32A Depression, unspecified: Secondary | ICD-10-CM | POA: Diagnosis not present

## 2021-09-27 DIAGNOSIS — N1832 Chronic kidney disease, stage 3b: Secondary | ICD-10-CM | POA: Diagnosis not present

## 2021-09-27 DIAGNOSIS — E039 Hypothyroidism, unspecified: Secondary | ICD-10-CM | POA: Insufficient documentation

## 2021-09-27 DIAGNOSIS — R296 Repeated falls: Secondary | ICD-10-CM | POA: Diagnosis not present

## 2021-09-27 DIAGNOSIS — M25532 Pain in left wrist: Secondary | ICD-10-CM | POA: Diagnosis not present

## 2021-09-27 DIAGNOSIS — I10 Essential (primary) hypertension: Secondary | ICD-10-CM | POA: Diagnosis not present

## 2021-09-27 DIAGNOSIS — I5022 Chronic systolic (congestive) heart failure: Secondary | ICD-10-CM | POA: Diagnosis not present

## 2021-09-27 DIAGNOSIS — E1165 Type 2 diabetes mellitus with hyperglycemia: Secondary | ICD-10-CM | POA: Diagnosis not present

## 2021-09-30 DIAGNOSIS — I255 Ischemic cardiomyopathy: Secondary | ICD-10-CM | POA: Diagnosis not present

## 2021-10-07 DIAGNOSIS — I2510 Atherosclerotic heart disease of native coronary artery without angina pectoris: Secondary | ICD-10-CM | POA: Diagnosis not present

## 2021-10-07 DIAGNOSIS — G894 Chronic pain syndrome: Secondary | ICD-10-CM | POA: Diagnosis not present

## 2021-10-07 DIAGNOSIS — Z13220 Encounter for screening for lipoid disorders: Secondary | ICD-10-CM | POA: Diagnosis not present

## 2021-10-07 DIAGNOSIS — E039 Hypothyroidism, unspecified: Secondary | ICD-10-CM | POA: Diagnosis not present

## 2021-10-07 DIAGNOSIS — R7309 Other abnormal glucose: Secondary | ICD-10-CM | POA: Diagnosis not present

## 2021-10-07 DIAGNOSIS — I509 Heart failure, unspecified: Secondary | ICD-10-CM | POA: Diagnosis not present

## 2021-10-07 DIAGNOSIS — I10 Essential (primary) hypertension: Secondary | ICD-10-CM | POA: Diagnosis not present

## 2021-10-07 DIAGNOSIS — C50919 Malignant neoplasm of unspecified site of unspecified female breast: Secondary | ICD-10-CM | POA: Diagnosis not present

## 2021-10-07 DIAGNOSIS — I4891 Unspecified atrial fibrillation: Secondary | ICD-10-CM | POA: Diagnosis not present

## 2021-10-07 DIAGNOSIS — E559 Vitamin D deficiency, unspecified: Secondary | ICD-10-CM | POA: Diagnosis not present

## 2021-10-08 DIAGNOSIS — I5020 Unspecified systolic (congestive) heart failure: Secondary | ICD-10-CM | POA: Diagnosis not present

## 2021-10-08 DIAGNOSIS — I2510 Atherosclerotic heart disease of native coronary artery without angina pectoris: Secondary | ICD-10-CM | POA: Diagnosis not present

## 2021-10-08 DIAGNOSIS — Z87440 Personal history of urinary (tract) infections: Secondary | ICD-10-CM | POA: Diagnosis not present

## 2021-10-08 DIAGNOSIS — I255 Ischemic cardiomyopathy: Secondary | ICD-10-CM | POA: Diagnosis not present

## 2021-10-08 DIAGNOSIS — I130 Hypertensive heart and chronic kidney disease with heart failure and stage 1 through stage 4 chronic kidney disease, or unspecified chronic kidney disease: Secondary | ICD-10-CM | POA: Diagnosis not present

## 2021-10-08 DIAGNOSIS — E785 Hyperlipidemia, unspecified: Secondary | ICD-10-CM | POA: Diagnosis not present

## 2021-10-08 DIAGNOSIS — G8929 Other chronic pain: Secondary | ICD-10-CM | POA: Diagnosis not present

## 2021-10-08 DIAGNOSIS — R296 Repeated falls: Secondary | ICD-10-CM | POA: Diagnosis not present

## 2021-10-08 DIAGNOSIS — I4819 Other persistent atrial fibrillation: Secondary | ICD-10-CM | POA: Diagnosis not present

## 2021-10-08 DIAGNOSIS — E1122 Type 2 diabetes mellitus with diabetic chronic kidney disease: Secondary | ICD-10-CM | POA: Diagnosis not present

## 2021-10-08 DIAGNOSIS — M545 Low back pain, unspecified: Secondary | ICD-10-CM | POA: Diagnosis not present

## 2021-10-08 DIAGNOSIS — N1832 Chronic kidney disease, stage 3b: Secondary | ICD-10-CM | POA: Diagnosis not present

## 2021-10-21 DIAGNOSIS — I429 Cardiomyopathy, unspecified: Secondary | ICD-10-CM | POA: Diagnosis not present

## 2021-10-23 DIAGNOSIS — I255 Ischemic cardiomyopathy: Secondary | ICD-10-CM | POA: Diagnosis not present

## 2021-10-23 DIAGNOSIS — G8929 Other chronic pain: Secondary | ICD-10-CM | POA: Diagnosis not present

## 2021-10-23 DIAGNOSIS — N1832 Chronic kidney disease, stage 3b: Secondary | ICD-10-CM | POA: Diagnosis not present

## 2021-10-23 DIAGNOSIS — R296 Repeated falls: Secondary | ICD-10-CM | POA: Diagnosis not present

## 2021-10-23 DIAGNOSIS — E1122 Type 2 diabetes mellitus with diabetic chronic kidney disease: Secondary | ICD-10-CM | POA: Diagnosis not present

## 2021-10-23 DIAGNOSIS — I130 Hypertensive heart and chronic kidney disease with heart failure and stage 1 through stage 4 chronic kidney disease, or unspecified chronic kidney disease: Secondary | ICD-10-CM | POA: Diagnosis not present

## 2021-10-23 DIAGNOSIS — M545 Low back pain, unspecified: Secondary | ICD-10-CM | POA: Diagnosis not present

## 2021-10-23 DIAGNOSIS — I5020 Unspecified systolic (congestive) heart failure: Secondary | ICD-10-CM | POA: Diagnosis not present

## 2021-10-23 DIAGNOSIS — I4819 Other persistent atrial fibrillation: Secondary | ICD-10-CM | POA: Diagnosis not present

## 2021-10-23 DIAGNOSIS — I2510 Atherosclerotic heart disease of native coronary artery without angina pectoris: Secondary | ICD-10-CM | POA: Diagnosis not present

## 2021-10-23 DIAGNOSIS — Z87440 Personal history of urinary (tract) infections: Secondary | ICD-10-CM | POA: Diagnosis not present

## 2021-10-23 DIAGNOSIS — E785 Hyperlipidemia, unspecified: Secondary | ICD-10-CM | POA: Diagnosis not present

## 2021-10-25 DIAGNOSIS — R296 Repeated falls: Secondary | ICD-10-CM | POA: Diagnosis not present

## 2021-10-25 DIAGNOSIS — I4819 Other persistent atrial fibrillation: Secondary | ICD-10-CM | POA: Diagnosis not present

## 2021-10-25 DIAGNOSIS — I255 Ischemic cardiomyopathy: Secondary | ICD-10-CM | POA: Diagnosis not present

## 2021-10-25 DIAGNOSIS — I2510 Atherosclerotic heart disease of native coronary artery without angina pectoris: Secondary | ICD-10-CM | POA: Diagnosis not present

## 2021-10-25 DIAGNOSIS — I130 Hypertensive heart and chronic kidney disease with heart failure and stage 1 through stage 4 chronic kidney disease, or unspecified chronic kidney disease: Secondary | ICD-10-CM | POA: Diagnosis not present

## 2021-10-25 DIAGNOSIS — E785 Hyperlipidemia, unspecified: Secondary | ICD-10-CM | POA: Diagnosis not present

## 2021-10-25 DIAGNOSIS — I5020 Unspecified systolic (congestive) heart failure: Secondary | ICD-10-CM | POA: Diagnosis not present

## 2021-10-25 DIAGNOSIS — Z87440 Personal history of urinary (tract) infections: Secondary | ICD-10-CM | POA: Diagnosis not present

## 2021-10-25 DIAGNOSIS — M545 Low back pain, unspecified: Secondary | ICD-10-CM | POA: Diagnosis not present

## 2021-10-25 DIAGNOSIS — G8929 Other chronic pain: Secondary | ICD-10-CM | POA: Diagnosis not present

## 2021-10-25 DIAGNOSIS — N1832 Chronic kidney disease, stage 3b: Secondary | ICD-10-CM | POA: Diagnosis not present

## 2021-10-25 DIAGNOSIS — E1122 Type 2 diabetes mellitus with diabetic chronic kidney disease: Secondary | ICD-10-CM | POA: Diagnosis not present

## 2021-10-30 DIAGNOSIS — I130 Hypertensive heart and chronic kidney disease with heart failure and stage 1 through stage 4 chronic kidney disease, or unspecified chronic kidney disease: Secondary | ICD-10-CM | POA: Diagnosis not present

## 2021-10-30 DIAGNOSIS — N1832 Chronic kidney disease, stage 3b: Secondary | ICD-10-CM | POA: Diagnosis not present

## 2021-10-30 DIAGNOSIS — I5020 Unspecified systolic (congestive) heart failure: Secondary | ICD-10-CM | POA: Diagnosis not present

## 2021-10-30 DIAGNOSIS — M545 Low back pain, unspecified: Secondary | ICD-10-CM | POA: Diagnosis not present

## 2021-10-30 DIAGNOSIS — I4819 Other persistent atrial fibrillation: Secondary | ICD-10-CM | POA: Diagnosis not present

## 2021-10-30 DIAGNOSIS — I2510 Atherosclerotic heart disease of native coronary artery without angina pectoris: Secondary | ICD-10-CM | POA: Diagnosis not present

## 2021-10-30 DIAGNOSIS — G8929 Other chronic pain: Secondary | ICD-10-CM | POA: Diagnosis not present

## 2021-10-30 DIAGNOSIS — I255 Ischemic cardiomyopathy: Secondary | ICD-10-CM | POA: Diagnosis not present

## 2021-10-30 DIAGNOSIS — E1122 Type 2 diabetes mellitus with diabetic chronic kidney disease: Secondary | ICD-10-CM | POA: Diagnosis not present

## 2021-10-30 DIAGNOSIS — E785 Hyperlipidemia, unspecified: Secondary | ICD-10-CM | POA: Diagnosis not present

## 2021-10-30 DIAGNOSIS — Z87440 Personal history of urinary (tract) infections: Secondary | ICD-10-CM | POA: Diagnosis not present

## 2021-10-30 DIAGNOSIS — R296 Repeated falls: Secondary | ICD-10-CM | POA: Diagnosis not present

## 2021-11-07 DIAGNOSIS — R296 Repeated falls: Secondary | ICD-10-CM | POA: Diagnosis not present

## 2021-11-07 DIAGNOSIS — I255 Ischemic cardiomyopathy: Secondary | ICD-10-CM | POA: Diagnosis not present

## 2021-11-07 DIAGNOSIS — I4819 Other persistent atrial fibrillation: Secondary | ICD-10-CM | POA: Diagnosis not present

## 2021-11-07 DIAGNOSIS — M545 Low back pain, unspecified: Secondary | ICD-10-CM | POA: Diagnosis not present

## 2021-11-07 DIAGNOSIS — E785 Hyperlipidemia, unspecified: Secondary | ICD-10-CM | POA: Diagnosis not present

## 2021-11-07 DIAGNOSIS — Z87440 Personal history of urinary (tract) infections: Secondary | ICD-10-CM | POA: Diagnosis not present

## 2021-11-07 DIAGNOSIS — G8929 Other chronic pain: Secondary | ICD-10-CM | POA: Diagnosis not present

## 2021-11-07 DIAGNOSIS — I2510 Atherosclerotic heart disease of native coronary artery without angina pectoris: Secondary | ICD-10-CM | POA: Diagnosis not present

## 2021-11-07 DIAGNOSIS — I130 Hypertensive heart and chronic kidney disease with heart failure and stage 1 through stage 4 chronic kidney disease, or unspecified chronic kidney disease: Secondary | ICD-10-CM | POA: Diagnosis not present

## 2021-11-07 DIAGNOSIS — E1122 Type 2 diabetes mellitus with diabetic chronic kidney disease: Secondary | ICD-10-CM | POA: Diagnosis not present

## 2021-11-07 DIAGNOSIS — I5020 Unspecified systolic (congestive) heart failure: Secondary | ICD-10-CM | POA: Diagnosis not present

## 2021-11-07 DIAGNOSIS — N1832 Chronic kidney disease, stage 3b: Secondary | ICD-10-CM | POA: Diagnosis not present

## 2021-11-08 DIAGNOSIS — M545 Low back pain, unspecified: Secondary | ICD-10-CM | POA: Diagnosis not present

## 2021-11-08 DIAGNOSIS — R296 Repeated falls: Secondary | ICD-10-CM | POA: Diagnosis not present

## 2021-11-08 DIAGNOSIS — G8929 Other chronic pain: Secondary | ICD-10-CM | POA: Diagnosis not present

## 2021-11-08 DIAGNOSIS — Z7902 Long term (current) use of antithrombotics/antiplatelets: Secondary | ICD-10-CM | POA: Diagnosis not present

## 2021-11-08 DIAGNOSIS — I447 Left bundle-branch block, unspecified: Secondary | ICD-10-CM | POA: Diagnosis not present

## 2021-11-08 DIAGNOSIS — N1832 Chronic kidney disease, stage 3b: Secondary | ICD-10-CM | POA: Diagnosis not present

## 2021-11-08 DIAGNOSIS — E079 Disorder of thyroid, unspecified: Secondary | ICD-10-CM | POA: Diagnosis not present

## 2021-11-08 DIAGNOSIS — I2510 Atherosclerotic heart disease of native coronary artery without angina pectoris: Secondary | ICD-10-CM | POA: Diagnosis not present

## 2021-11-08 DIAGNOSIS — I130 Hypertensive heart and chronic kidney disease with heart failure and stage 1 through stage 4 chronic kidney disease, or unspecified chronic kidney disease: Secondary | ICD-10-CM | POA: Diagnosis not present

## 2021-11-08 DIAGNOSIS — Z7984 Long term (current) use of oral hypoglycemic drugs: Secondary | ICD-10-CM | POA: Diagnosis not present

## 2021-11-08 DIAGNOSIS — E1122 Type 2 diabetes mellitus with diabetic chronic kidney disease: Secondary | ICD-10-CM | POA: Diagnosis not present

## 2021-11-08 DIAGNOSIS — I255 Ischemic cardiomyopathy: Secondary | ICD-10-CM | POA: Diagnosis not present

## 2021-11-08 DIAGNOSIS — I4819 Other persistent atrial fibrillation: Secondary | ICD-10-CM | POA: Diagnosis not present

## 2021-11-08 DIAGNOSIS — Z87440 Personal history of urinary (tract) infections: Secondary | ICD-10-CM | POA: Diagnosis not present

## 2021-11-08 DIAGNOSIS — E785 Hyperlipidemia, unspecified: Secondary | ICD-10-CM | POA: Diagnosis not present

## 2021-11-08 DIAGNOSIS — I340 Nonrheumatic mitral (valve) insufficiency: Secondary | ICD-10-CM | POA: Diagnosis not present

## 2021-11-08 DIAGNOSIS — I5020 Unspecified systolic (congestive) heart failure: Secondary | ICD-10-CM | POA: Diagnosis not present

## 2021-11-11 DIAGNOSIS — I130 Hypertensive heart and chronic kidney disease with heart failure and stage 1 through stage 4 chronic kidney disease, or unspecified chronic kidney disease: Secondary | ICD-10-CM | POA: Diagnosis not present

## 2021-11-11 DIAGNOSIS — E079 Disorder of thyroid, unspecified: Secondary | ICD-10-CM | POA: Diagnosis not present

## 2021-11-11 DIAGNOSIS — Z7902 Long term (current) use of antithrombotics/antiplatelets: Secondary | ICD-10-CM | POA: Diagnosis not present

## 2021-11-11 DIAGNOSIS — I2510 Atherosclerotic heart disease of native coronary artery without angina pectoris: Secondary | ICD-10-CM | POA: Diagnosis not present

## 2021-11-11 DIAGNOSIS — I5020 Unspecified systolic (congestive) heart failure: Secondary | ICD-10-CM | POA: Diagnosis not present

## 2021-11-11 DIAGNOSIS — Z7984 Long term (current) use of oral hypoglycemic drugs: Secondary | ICD-10-CM | POA: Diagnosis not present

## 2021-11-11 DIAGNOSIS — E1122 Type 2 diabetes mellitus with diabetic chronic kidney disease: Secondary | ICD-10-CM | POA: Diagnosis not present

## 2021-11-11 DIAGNOSIS — N1832 Chronic kidney disease, stage 3b: Secondary | ICD-10-CM | POA: Diagnosis not present

## 2021-11-11 DIAGNOSIS — R296 Repeated falls: Secondary | ICD-10-CM | POA: Diagnosis not present

## 2021-11-11 DIAGNOSIS — M545 Low back pain, unspecified: Secondary | ICD-10-CM | POA: Diagnosis not present

## 2021-11-11 DIAGNOSIS — I447 Left bundle-branch block, unspecified: Secondary | ICD-10-CM | POA: Diagnosis not present

## 2021-11-11 DIAGNOSIS — E785 Hyperlipidemia, unspecified: Secondary | ICD-10-CM | POA: Diagnosis not present

## 2021-11-11 DIAGNOSIS — I340 Nonrheumatic mitral (valve) insufficiency: Secondary | ICD-10-CM | POA: Diagnosis not present

## 2021-11-11 DIAGNOSIS — I4819 Other persistent atrial fibrillation: Secondary | ICD-10-CM | POA: Diagnosis not present

## 2021-11-11 DIAGNOSIS — G8929 Other chronic pain: Secondary | ICD-10-CM | POA: Diagnosis not present

## 2021-11-11 DIAGNOSIS — I255 Ischemic cardiomyopathy: Secondary | ICD-10-CM | POA: Diagnosis not present

## 2021-11-11 DIAGNOSIS — Z87440 Personal history of urinary (tract) infections: Secondary | ICD-10-CM | POA: Diagnosis not present

## 2021-11-13 DIAGNOSIS — J329 Chronic sinusitis, unspecified: Secondary | ICD-10-CM | POA: Diagnosis not present

## 2021-11-13 DIAGNOSIS — E039 Hypothyroidism, unspecified: Secondary | ICD-10-CM | POA: Diagnosis not present

## 2021-11-18 DIAGNOSIS — Z7902 Long term (current) use of antithrombotics/antiplatelets: Secondary | ICD-10-CM | POA: Diagnosis not present

## 2021-11-18 DIAGNOSIS — E1122 Type 2 diabetes mellitus with diabetic chronic kidney disease: Secondary | ICD-10-CM | POA: Diagnosis not present

## 2021-11-18 DIAGNOSIS — I255 Ischemic cardiomyopathy: Secondary | ICD-10-CM | POA: Diagnosis not present

## 2021-11-18 DIAGNOSIS — I447 Left bundle-branch block, unspecified: Secondary | ICD-10-CM | POA: Diagnosis not present

## 2021-11-18 DIAGNOSIS — N1832 Chronic kidney disease, stage 3b: Secondary | ICD-10-CM | POA: Diagnosis not present

## 2021-11-18 DIAGNOSIS — E079 Disorder of thyroid, unspecified: Secondary | ICD-10-CM | POA: Diagnosis not present

## 2021-11-18 DIAGNOSIS — Z7984 Long term (current) use of oral hypoglycemic drugs: Secondary | ICD-10-CM | POA: Diagnosis not present

## 2021-11-18 DIAGNOSIS — R296 Repeated falls: Secondary | ICD-10-CM | POA: Diagnosis not present

## 2021-11-18 DIAGNOSIS — I130 Hypertensive heart and chronic kidney disease with heart failure and stage 1 through stage 4 chronic kidney disease, or unspecified chronic kidney disease: Secondary | ICD-10-CM | POA: Diagnosis not present

## 2021-11-18 DIAGNOSIS — E785 Hyperlipidemia, unspecified: Secondary | ICD-10-CM | POA: Diagnosis not present

## 2021-11-18 DIAGNOSIS — I4819 Other persistent atrial fibrillation: Secondary | ICD-10-CM | POA: Diagnosis not present

## 2021-11-18 DIAGNOSIS — I340 Nonrheumatic mitral (valve) insufficiency: Secondary | ICD-10-CM | POA: Diagnosis not present

## 2021-11-18 DIAGNOSIS — I2510 Atherosclerotic heart disease of native coronary artery without angina pectoris: Secondary | ICD-10-CM | POA: Diagnosis not present

## 2021-11-18 DIAGNOSIS — I5020 Unspecified systolic (congestive) heart failure: Secondary | ICD-10-CM | POA: Diagnosis not present

## 2021-11-18 DIAGNOSIS — M545 Low back pain, unspecified: Secondary | ICD-10-CM | POA: Diagnosis not present

## 2021-11-18 DIAGNOSIS — Z87440 Personal history of urinary (tract) infections: Secondary | ICD-10-CM | POA: Diagnosis not present

## 2021-11-18 DIAGNOSIS — G8929 Other chronic pain: Secondary | ICD-10-CM | POA: Diagnosis not present

## 2021-11-19 DIAGNOSIS — R059 Cough, unspecified: Secondary | ICD-10-CM | POA: Diagnosis not present

## 2021-11-19 DIAGNOSIS — I10 Essential (primary) hypertension: Secondary | ICD-10-CM | POA: Diagnosis not present

## 2022-10-03 ENCOUNTER — Other Ambulatory Visit: Payer: Self-pay | Admitting: Family Medicine

## 2022-10-03 DIAGNOSIS — Z1231 Encounter for screening mammogram for malignant neoplasm of breast: Secondary | ICD-10-CM

## 2022-10-10 ENCOUNTER — Inpatient Hospital Stay: Admission: RE | Admit: 2022-10-10 | Payer: PPO | Source: Ambulatory Visit

## 2022-12-02 ENCOUNTER — Other Ambulatory Visit (HOSPITAL_COMMUNITY): Payer: Self-pay | Admitting: Nephrology

## 2022-12-02 DIAGNOSIS — I129 Hypertensive chronic kidney disease with stage 1 through stage 4 chronic kidney disease, or unspecified chronic kidney disease: Secondary | ICD-10-CM

## 2022-12-02 DIAGNOSIS — E559 Vitamin D deficiency, unspecified: Secondary | ICD-10-CM

## 2022-12-02 DIAGNOSIS — D638 Anemia in other chronic diseases classified elsewhere: Secondary | ICD-10-CM

## 2022-12-02 DIAGNOSIS — N189 Chronic kidney disease, unspecified: Secondary | ICD-10-CM

## 2023-01-30 ENCOUNTER — Emergency Department (HOSPITAL_COMMUNITY)
Admission: EM | Admit: 2023-01-30 | Discharge: 2023-01-30 | Disposition: A | Discharge status: Home / Self Care | Payer: Medicare Other | Attending: Emergency Medicine | Admitting: Emergency Medicine

## 2023-01-30 ENCOUNTER — Emergency Department (HOSPITAL_COMMUNITY): Payer: Medicare Other

## 2023-01-30 DIAGNOSIS — I129 Hypertensive chronic kidney disease with stage 1 through stage 4 chronic kidney disease, or unspecified chronic kidney disease: Secondary | ICD-10-CM | POA: Diagnosis not present

## 2023-01-30 DIAGNOSIS — E11621 Type 2 diabetes mellitus with foot ulcer: Secondary | ICD-10-CM | POA: Insufficient documentation

## 2023-01-30 DIAGNOSIS — L97411 Non-pressure chronic ulcer of right heel and midfoot limited to breakdown of skin: Secondary | ICD-10-CM | POA: Insufficient documentation

## 2023-01-30 DIAGNOSIS — I2510 Atherosclerotic heart disease of native coronary artery without angina pectoris: Secondary | ICD-10-CM | POA: Insufficient documentation

## 2023-01-30 DIAGNOSIS — Z7982 Long term (current) use of aspirin: Secondary | ICD-10-CM | POA: Insufficient documentation

## 2023-01-30 DIAGNOSIS — N189 Chronic kidney disease, unspecified: Secondary | ICD-10-CM | POA: Insufficient documentation

## 2023-01-30 DIAGNOSIS — Z7902 Long term (current) use of antithrombotics/antiplatelets: Secondary | ICD-10-CM | POA: Diagnosis not present

## 2023-01-30 DIAGNOSIS — S91301A Unspecified open wound, right foot, initial encounter: Secondary | ICD-10-CM

## 2023-01-30 DIAGNOSIS — X58XXXA Exposure to other specified factors, initial encounter: Secondary | ICD-10-CM | POA: Insufficient documentation

## 2023-01-30 DIAGNOSIS — E1122 Type 2 diabetes mellitus with diabetic chronic kidney disease: Secondary | ICD-10-CM | POA: Diagnosis not present

## 2023-01-30 DIAGNOSIS — Z79899 Other long term (current) drug therapy: Secondary | ICD-10-CM | POA: Diagnosis not present

## 2023-01-30 LAB — CBC WITH DIFFERENTIAL/PLATELET
Abs Immature Granulocytes: 0.01 10*3/uL (ref 0.00–0.07)
Basophils Absolute: 0.1 10*3/uL (ref 0.0–0.1)
Basophils Relative: 1 %
Eosinophils Absolute: 0.4 10*3/uL (ref 0.0–0.5)
Eosinophils Relative: 7 %
HCT: 39.6 % (ref 36.0–46.0)
Hemoglobin: 12.3 g/dL (ref 12.0–15.0)
Immature Granulocytes: 0 %
Lymphocytes Relative: 17 %
Lymphs Abs: 1 10*3/uL (ref 0.7–4.0)
MCH: 28.2 pg (ref 26.0–34.0)
MCHC: 31.1 g/dL (ref 30.0–36.0)
MCV: 90.8 fL (ref 80.0–100.0)
Monocytes Absolute: 0.5 10*3/uL (ref 0.1–1.0)
Monocytes Relative: 9 %
Neutro Abs: 4 10*3/uL (ref 1.7–7.7)
Neutrophils Relative %: 66 %
Platelets: 172 10*3/uL (ref 150–400)
RBC: 4.36 MIL/uL (ref 3.87–5.11)
RDW: 14 % (ref 11.5–15.5)
WBC: 6 10*3/uL (ref 4.0–10.5)
nRBC: 0 % (ref 0.0–0.2)

## 2023-01-30 LAB — BASIC METABOLIC PANEL
Anion gap: 12 (ref 5–15)
BUN: 32 mg/dL — ABNORMAL HIGH (ref 8–23)
CO2: 27 mmol/L (ref 22–32)
Calcium: 8.7 mg/dL — ABNORMAL LOW (ref 8.9–10.3)
Chloride: 99 mmol/L (ref 98–111)
Creatinine, Ser: 2.02 mg/dL — ABNORMAL HIGH (ref 0.44–1.00)
GFR, Estimated: 26 mL/min — ABNORMAL LOW (ref >60)
Glucose, Bld: 144 mg/dL — ABNORMAL HIGH (ref 70–99)
Potassium: 3.5 mmol/L (ref 3.5–5.1)
Sodium: 138 mmol/L (ref 135–145)

## 2023-01-30 LAB — SEDIMENTATION RATE: Sed Rate: 58 mm/hr — ABNORMAL HIGH (ref 0–22)

## 2023-01-30 LAB — CBG MONITORING, ED: Glucose-Capillary: 132 mg/dL — ABNORMAL HIGH (ref 70–99)

## 2023-01-30 LAB — C-REACTIVE PROTEIN: CRP: 1.1 mg/dL — ABNORMAL HIGH (ref <1.0)

## 2023-01-30 MED ORDER — DOXYCYCLINE HYCLATE 100 MG PO CAPS: 100.0000 mg | ORAL_CAPSULE | Freq: Two times a day (BID) | ORAL | 0 refills | Status: DC

## 2023-01-30 MED ORDER — CEPHALEXIN 500 MG PO CAPS: 500.0000 mg | ORAL_CAPSULE | Freq: Four times a day (QID) | ORAL | 0 refills | Status: DC

## 2023-01-30 NOTE — ED Notes (Signed)
Wet to dry dressing applied to right heel per PA verbal order read back and verified. Instruction on how to change at home until her PCP appointment, pt verbalized understanding.

## 2023-01-30 NOTE — ED Notes (Signed)
Pt is alert and oriented, color WNL, see triage notes, circular open wound with foul odor to right heel. Has had tx in the past, but stopped going because it wasn't getting any better. Swelling from right toes to lower calf noted. No LLE swelling.

## 2023-01-30 NOTE — ED Triage Notes (Signed)
Pt was at her nephrologist today when they noticed her limping and referred her to ED for wound check. Pt has chronic wound to R heel. Has hx of T2DM. Pt denies recent fevers, drainage, but states that "it is starting to smell".

## 2023-01-30 NOTE — Discharge Instructions (Signed)
Thank you for allowing me to be part of your care today.  I have sent over 2 antibiotics to your pharmacy for you to begin taking today.  You have a follow-up appointment with your primary care doctor on Tuesday, 3/12 at 9:45AM for wound re-check and to determine if more advanced imaging is needed at that time.   Continue to monitor your blood sugars.  Return to the ED if you have worsening of your symptoms, develop fever or other signs of worsening infection, or have any new concerns.

## 2023-01-30 NOTE — ED Provider Notes (Signed)
[facility] Provider Note   CSN: [id number] Arrival date & time: 01/30/23  1049     History  Chief Complaint  Patient presents with   Wound Check    Kristen Garner is a 72 y.o. female with past medical history significant for CAD, CKD, DM, hypertension presents to the ED from her nephrology office due to a right heel wound.  Patient's nephrologist was concerned that she has had this wound for a long time and it was beginning to have a foul smell.  Patient unable to recall exactly how long she has had this ulcer to her foot but states "it has been sometime".  Patient has not received any wound care and has been attempting to treat herself at home.  She reports that it is very painful to touch and bear weight on that foot.  Denies fever, drainage or bleeding from wound site, numbness, weakness.  She reports that the pain when she bears weight or presses on the ulcer area feels like "needles in her foot".       Home Medications Prior to Admission medications   Medication Sig Start Date End Date Taking? Authorizing Provider  cephALEXin (KEFLEX) 500 MG capsule Take 1 capsule (500 mg total) by mouth 4 (four) times daily for 10 days. 01/30/23 02/09/23 Yes Toluwani Ruder R, PA  doxycycline (VIBRAMYCIN) 100 MG capsule Take 1 capsule (100 mg total) by mouth 2 (two) times daily. 01/30/23  Yes Jayshon Dommer R, PA  albuterol (PROVENTIL HFA;VENTOLIN HFA) 108 (90 Base) MCG/ACT inhaler Inhale 2 puffs into the lungs every 4 (four) hours as needed for wheezing or shortness of breath. 03/28/17   Orpah Greek, MD  amiodarone (PACERONE) 200 MG tablet Take 1 tablet (200 mg total) 2 (two) times daily by mouth. Patient not taking: Reported on 01/15/2019 10/07/17   Kathie Dike, MD  aspirin EC 81 MG EC tablet Take 1 tablet (81 mg total) by mouth daily. 10/16/16   Strader, Fransisco Hertz, PA-C  calcium carbonate (ANTACID) 420 MG CHEW chewable tablet Chew 420 mg  daily as needed by mouth for indigestion or heartburn.    [provider]  clopidogrel (PLAVIX) 75 MG tablet Take 1 tablet (75 mg total) daily by mouth. 10/07/17   Kathie Dike, MD  escitalopram (LEXAPRO) 10 MG tablet Take 10 mg daily by mouth. 07/16/17   [provider]  furosemide (LASIX) 40 MG tablet Take 1 tablet (40 mg total) daily by mouth. 10/07/17   Kathie Dike, MD  HYDROcodone-acetaminophen (NORCO/VICODIN) 5-325 MG tablet Take one tab po q 6 hrs prn pain Patient taking differently: Take 1-2 tablets by mouth every 6 (six) hours as needed for moderate pain. Take one tab po q 6 hrs prn pain 07/12/17   Triplett, Tammy, PA-C  levothyroxine (SYNTHROID, LEVOTHROID) 50 MCG tablet Take 1 tablet by mouth daily. 12/01/18   [provider]  metoprolol succinate (TOPROL-XL) 100 MG 24 hr tablet Take 1 tablet (100 mg total) 2 (two) times daily by mouth. Take with or immediately following a meal. Patient not taking: Reported on 01/15/2019 10/07/17   Kathie Dike, MD  metoprolol succinate (TOPROL-XL) 50 MG 24 hr tablet Take 1 tablet by mouth 2 (two) times daily. 12/01/18   [provider]  nitroGLYCERIN (NITROSTAT) 0.4 MG SL tablet Place 0.4 mg under the tongue every 5 (five) minutes as needed for chest pain.    [provider]  pantoprazole (PROTONIX) 40 MG tablet  Take 1 tablet (40 mg total) by mouth daily. 10/31/17   Orson Eva, MD  Potassium Chloride ER 20 MEQ TBCR Take 20 mEq by mouth daily. 11/12/16   Deboraha Sprang, MD  rOPINIRole (REQUIP) 1 MG tablet Take 1 mg by mouth 3 (three) times daily.  08/17/17   [provider]  rosuvastatin (CRESTOR) 5 MG tablet Take 1 tablet (5 mg total) daily by mouth. 10/07/17 01/15/19  Kathie Dike, MD  sertraline (ZOLOFT) 25 MG tablet Take 1 tablet by mouth daily. 11/03/18   [provider]  Vitamin D, Ergocalciferol, (DRISDOL) 1.25 MG (50000 UT) CAPS capsule Take 1 capsule by mouth once a week. saturdays  12/02/18   [provider]  zolpidem (AMBIEN) 10 MG tablet Take 10 mg by mouth at bedtime.    [provider]      Allergies    Adhesive [tape], Codeine, Penicillins, Sulfonamide derivatives, and Iodine    Review of Systems   Review of Systems  Constitutional:  Negative for chills and fever.  Skin:  Positive for wound (right heel).  Neurological:  Negative for weakness and numbness.    Physical Exam Updated Vital Signs BP (!) 158/79   Pulse 72   Temp 97.8 F (36.6 C) (Oral)   Resp 17   SpO2 98%  Physical Exam Vitals and nursing note reviewed.  Constitutional:      General: She is not in acute distress.    Appearance: Normal appearance. She is not ill-appearing or diaphoretic.  Cardiovascular:     Rate and Rhythm: Normal rate and regular rhythm.     Pulses:          Dorsalis pedis pulses are 2+ on the right side.  Pulmonary:     Effort: Pulmonary effort is normal.  Feet:     Right foot:     Skin integrity: Ulcer, skin breakdown, erythema, warmth and dry skin present.     Comments: Right heel wound without obvious drainage.  There is surrounding erythema and increased warmth suggestive of cellulitis.   Neurological:     Mental Status: She is alert. Mental status is at baseline.  Psychiatric:        Mood and Affect: Mood normal.        Behavior: Behavior normal.     ED Results / Procedures / Treatments   Labs (all labs ordered are listed, but only abnormal results are displayed) Labs Reviewed  BASIC METABOLIC PANEL - Abnormal; Notable for the following components:      Result Value   Glucose, Bld 144 (*)    BUN 32 (*)    Creatinine, Ser 2.02 (*)    Calcium 8.7 (*)    GFR, Estimated 26 (*)    All other components within normal limits  SEDIMENTATION RATE - Abnormal; Notable for the following components:   Sed Rate 58 (*)    All other components within normal limits  CBG MONITORING, ED - Abnormal; Notable for the following components:    Glucose-Capillary 132 (*)    All other components within normal limits  CBC WITH DIFFERENTIAL/PLATELET  C-REACTIVE PROTEIN    EKG None  Radiology DG Foot Complete Right  Result Date: 01/30/2023 CLINICAL DATA:  Heel wound. EXAM: RIGHT FOOT COMPLETE - 3 VIEW COMPARISON:  None Available. FINDINGS: Osteopenia. Diffuse soft tissue swelling. No fracture or dislocation. Moderate well corticated plantar and Achilles calcaneal spurs. No definite erosive changes. There is a focal soft tissue irregularity on the lateral view  dorsal and caudal to the calcaneus. Overall if there is further concern of osteomyelitis, additional workup with MRI or bone scan can be performed for further sensitivity as clinically appropriate IMPRESSION: Soft tissue swelling with subtle soft tissue defect dorsal and caudal to the calcaneus on lateral view. Osteopenia.  Calcaneal spurs. Electronically Signed   By: Jill Side M.D.   On: 01/30/2023 12:15    Procedures Procedures    Medications Ordered in ED Medications - No data to display  ED Course/ Medical Decision Making/ A&P                             Medical Decision Making Amount and/or Complexity of Data Reviewed Labs: ordered. Radiology: ordered.  Risk Prescription drug management.   This patient presents to the ED with chief complaint(s) of right heel wound without healing with pertinent past medical history of CKD, DM, HTN.  The complaint involves an extensive differential diagnosis and also carries with it a high risk of complications and morbidity.    The differential diagnosis includes osteomyelitis, gangrene, cellulitis, diabetic foot ulcer with infection; very low suspicion for sepsis given patient has been a febrile and without other SIRS criteria  The initial plan is to obtain baseline labs and x-ray of foot   Additional history obtained: Records reviewed  nephrology visit from today, provider concerned for foul odor and longstanding diabetic  foot ulcer that has not healed.  Nephrologist sent patient to ED for further evaluation.  Initial Assessment:   Exam significant for a right heel wound on the plantar surface with surrounding erythema and increased warmth suggestive of cellulitis.  There is an area of exposed tissue that does not have any active draining or bleeding.  Portion of the wound is covered in thick eschar.  See attached photo.  No obvious foul odor during exam.  She does have significant tenderness to the heel.  DP pulses 2+.  Normal range of motion of digits and right ankle.  Independent ECG/labs interpretation:  The following labs were independently interpreted:  CBC without leukocytosis or anemia.  Metabolic panel with elevated creatinine, patient does have history of CKD.  No other major electrolyte disturbances.  Sed rate is elevated at 58.  C-reactive protein was ordered and is still in process.  Independent visualization and interpretation of imaging: I independently visualized the following imaging with scope of interpretation limited to determining acute life threatening conditions related to emergency care: Right foot x-ray, which revealed soft tissue swelling and focal soft tissue irregularity on the lateral view of the calcaneus.  I agree with radiologist interpretation.  Recommendation was made for MRI if osteomyelitis was concern.  Treatment and Reassessment: Patient has a longstanding wound that has not been healing.  X-ray of foot cannot completely rule out osteomyelitis.  Workup is reassuring for no systemic infection.  MRI is unfortunately unavailable at this time.  Patient was evaluated by attending as well and it was determined that patient could be placed on outpatient antibiotics with close PCP follow-up.  Appointment was scheduled with PCP for early next week for follow-up and determining if advanced imaging is required at that time.  Patient's wound was dressed and a wet-to-dry dressing.  Disposition:    Patient sent home on 10-day course of antibiotics with broad-spectrum coverage.  Patient is to follow-up with primary care provider on Tuesday, 02/03/2023.  Patient may require more advanced imaging to rule in/rule out osteomyelitis of  the calcaneus.  Patient may also require ongoing wound care given poor wound healing.  The patient has been appropriately medically screened and/or stabilized in the ED. I have low suspicion for any other emergent medical condition which would require further screening, evaluation or treatment in the ED or require inpatient management. At time of discharge the patient is hemodynamically stable and in no acute distress. I have discussed work-up results and diagnosis with patient and answered all questions. Patient is agreeable with discharge plan. We discussed strict return precautions for returning to the emergency department and they verbalized understanding.           Final Clinical Impression(s) / ED Diagnoses Final diagnoses:  Non-healing open wound of right heel    Rx / DC Orders ED Discharge Orders          Ordered    cephALEXin (KEFLEX) 500 MG capsule  4 times daily        01/30/23 1602    doxycycline (VIBRAMYCIN) 100 MG capsule  2 times daily        01/30/23 1602              Pat Kocher, [place] 01/30/23 1825    Varney Biles, MD 01/31/23 1317

## 2023-02-02 ENCOUNTER — Other Ambulatory Visit (HOSPITAL_COMMUNITY): Payer: Self-pay | Admitting: Nephrology

## 2023-02-02 DIAGNOSIS — D638 Anemia in other chronic diseases classified elsewhere: Secondary | ICD-10-CM

## 2023-02-02 DIAGNOSIS — N189 Chronic kidney disease, unspecified: Secondary | ICD-10-CM

## 2023-02-02 DIAGNOSIS — I129 Hypertensive chronic kidney disease with stage 1 through stage 4 chronic kidney disease, or unspecified chronic kidney disease: Secondary | ICD-10-CM

## 2023-02-02 DIAGNOSIS — E559 Vitamin D deficiency, unspecified: Secondary | ICD-10-CM

## 2023-02-03 ENCOUNTER — Encounter (HOSPITAL_COMMUNITY): Payer: Self-pay

## 2023-02-03 ENCOUNTER — Emergency Department (HOSPITAL_COMMUNITY): Payer: Medicare Other

## 2023-02-03 ENCOUNTER — Other Ambulatory Visit: Payer: Self-pay

## 2023-02-03 ENCOUNTER — Inpatient Hospital Stay (HOSPITAL_COMMUNITY)
Admission: EM | Admit: 2023-02-03 | Discharge: 2023-02-06 | DRG: 638 | Disposition: A | Discharge status: Home / Self Care | Payer: Medicare Other | Attending: Family Medicine | Admitting: Family Medicine

## 2023-02-03 DIAGNOSIS — E1165 Type 2 diabetes mellitus with hyperglycemia: Secondary | ICD-10-CM | POA: Diagnosis present

## 2023-02-03 DIAGNOSIS — Z955 Presence of coronary angioplasty implant and graft: Secondary | ICD-10-CM

## 2023-02-03 DIAGNOSIS — Z880 Allergy status to penicillin: Secondary | ICD-10-CM

## 2023-02-03 DIAGNOSIS — I255 Ischemic cardiomyopathy: Secondary | ICD-10-CM | POA: Diagnosis present

## 2023-02-03 DIAGNOSIS — L089 Local infection of the skin and subcutaneous tissue, unspecified: Secondary | ICD-10-CM | POA: Diagnosis not present

## 2023-02-03 DIAGNOSIS — E1151 Type 2 diabetes mellitus with diabetic peripheral angiopathy without gangrene: Secondary | ICD-10-CM | POA: Diagnosis present

## 2023-02-03 DIAGNOSIS — Z7902 Long term (current) use of antithrombotics/antiplatelets: Secondary | ICD-10-CM

## 2023-02-03 DIAGNOSIS — Z79899 Other long term (current) drug therapy: Secondary | ICD-10-CM

## 2023-02-03 DIAGNOSIS — I5022 Chronic systolic (congestive) heart failure: Secondary | ICD-10-CM | POA: Diagnosis present

## 2023-02-03 DIAGNOSIS — Z882 Allergy status to sulfonamides status: Secondary | ICD-10-CM

## 2023-02-03 DIAGNOSIS — Z6830 Body mass index (BMI) 30.0-30.9, adult: Secondary | ICD-10-CM | POA: Diagnosis not present

## 2023-02-03 DIAGNOSIS — K219 Gastro-esophageal reflux disease without esophagitis: Secondary | ICD-10-CM | POA: Diagnosis present

## 2023-02-03 DIAGNOSIS — E1122 Type 2 diabetes mellitus with diabetic chronic kidney disease: Secondary | ICD-10-CM | POA: Diagnosis present

## 2023-02-03 DIAGNOSIS — E785 Hyperlipidemia, unspecified: Secondary | ICD-10-CM | POA: Diagnosis present

## 2023-02-03 DIAGNOSIS — I130 Hypertensive heart and chronic kidney disease with heart failure and stage 1 through stage 4 chronic kidney disease, or unspecified chronic kidney disease: Secondary | ICD-10-CM | POA: Diagnosis present

## 2023-02-03 DIAGNOSIS — Z823 Family history of stroke: Secondary | ICD-10-CM

## 2023-02-03 DIAGNOSIS — J069 Acute upper respiratory infection, unspecified: Secondary | ICD-10-CM | POA: Diagnosis not present

## 2023-02-03 DIAGNOSIS — I5042 Chronic combined systolic (congestive) and diastolic (congestive) heart failure: Secondary | ICD-10-CM | POA: Diagnosis present

## 2023-02-03 DIAGNOSIS — E11621 Type 2 diabetes mellitus with foot ulcer: Secondary | ICD-10-CM | POA: Diagnosis present

## 2023-02-03 DIAGNOSIS — E119 Type 2 diabetes mellitus without complications: Secondary | ICD-10-CM

## 2023-02-03 DIAGNOSIS — E11628 Type 2 diabetes mellitus with other skin complications: Principal | ICD-10-CM

## 2023-02-03 DIAGNOSIS — Z87891 Personal history of nicotine dependence: Secondary | ICD-10-CM

## 2023-02-03 DIAGNOSIS — Z853 Personal history of malignant neoplasm of breast: Secondary | ICD-10-CM

## 2023-02-03 DIAGNOSIS — Z79890 Hormone replacement therapy (postmenopausal): Secondary | ICD-10-CM | POA: Diagnosis not present

## 2023-02-03 DIAGNOSIS — Z7982 Long term (current) use of aspirin: Secondary | ICD-10-CM | POA: Diagnosis not present

## 2023-02-03 DIAGNOSIS — Z85828 Personal history of other malignant neoplasm of skin: Secondary | ICD-10-CM

## 2023-02-03 DIAGNOSIS — Z8679 Personal history of other diseases of the circulatory system: Secondary | ICD-10-CM

## 2023-02-03 DIAGNOSIS — Z90710 Acquired absence of both cervix and uterus: Secondary | ICD-10-CM

## 2023-02-03 DIAGNOSIS — F419 Anxiety disorder, unspecified: Secondary | ICD-10-CM | POA: Diagnosis present

## 2023-02-03 DIAGNOSIS — Z91048 Other nonmedicinal substance allergy status: Secondary | ICD-10-CM

## 2023-02-03 DIAGNOSIS — E669 Obesity, unspecified: Secondary | ICD-10-CM | POA: Diagnosis present

## 2023-02-03 DIAGNOSIS — N184 Chronic kidney disease, stage 4 (severe): Secondary | ICD-10-CM | POA: Diagnosis present

## 2023-02-03 DIAGNOSIS — Z833 Family history of diabetes mellitus: Secondary | ICD-10-CM

## 2023-02-03 DIAGNOSIS — Z885 Allergy status to narcotic agent status: Secondary | ICD-10-CM

## 2023-02-03 DIAGNOSIS — L97419 Non-pressure chronic ulcer of right heel and midfoot with unspecified severity: Secondary | ICD-10-CM | POA: Diagnosis present

## 2023-02-03 DIAGNOSIS — I10 Essential (primary) hypertension: Secondary | ICD-10-CM | POA: Diagnosis present

## 2023-02-03 DIAGNOSIS — Z888 Allergy status to other drugs, medicaments and biological substances status: Secondary | ICD-10-CM

## 2023-02-03 DIAGNOSIS — M8580 Other specified disorders of bone density and structure, unspecified site: Secondary | ICD-10-CM | POA: Diagnosis present

## 2023-02-03 DIAGNOSIS — I2510 Atherosclerotic heart disease of native coronary artery without angina pectoris: Secondary | ICD-10-CM | POA: Diagnosis present

## 2023-02-03 DIAGNOSIS — Z9049 Acquired absence of other specified parts of digestive tract: Secondary | ICD-10-CM

## 2023-02-03 DIAGNOSIS — L03115 Cellulitis of right lower limb: Secondary | ICD-10-CM | POA: Diagnosis present

## 2023-02-03 LAB — COMPREHENSIVE METABOLIC PANEL
ALT: 18 U/L (ref 0–44)
AST: 33 U/L (ref 15–41)
Albumin: 3.5 g/dL (ref 3.5–5.0)
Alkaline Phosphatase: 110 U/L (ref 38–126)
Anion gap: 10 (ref 5–15)
BUN: 33 mg/dL — ABNORMAL HIGH (ref 8–23)
CO2: 26 mmol/L (ref 22–32)
Calcium: 8.6 mg/dL — ABNORMAL LOW (ref 8.9–10.3)
Chloride: 104 mmol/L (ref 98–111)
Creatinine, Ser: 2 mg/dL — ABNORMAL HIGH (ref 0.44–1.00)
GFR, Estimated: 26 mL/min — ABNORMAL LOW (ref >60)
Glucose, Bld: 150 mg/dL — ABNORMAL HIGH (ref 70–99)
Potassium: 3.6 mmol/L (ref 3.5–5.1)
Sodium: 140 mmol/L (ref 135–145)
Total Bilirubin: 0.5 mg/dL (ref 0.3–1.2)
Total Protein: 7.2 g/dL (ref 6.5–8.1)

## 2023-02-03 LAB — CBC WITH DIFFERENTIAL/PLATELET
Abs Immature Granulocytes: 0.02 10*3/uL (ref 0.00–0.07)
Basophils Absolute: 0.1 10*3/uL (ref 0.0–0.1)
Basophils Relative: 1 %
Eosinophils Absolute: 0.3 10*3/uL (ref 0.0–0.5)
Eosinophils Relative: 7 %
HCT: 37.9 % (ref 36.0–46.0)
Hemoglobin: 11.8 g/dL — ABNORMAL LOW (ref 12.0–15.0)
Immature Granulocytes: 0 %
Lymphocytes Relative: 13 %
Lymphs Abs: 0.7 10*3/uL (ref 0.7–4.0)
MCH: 27.8 pg (ref 26.0–34.0)
MCHC: 31.1 g/dL (ref 30.0–36.0)
MCV: 89.2 fL (ref 80.0–100.0)
Monocytes Absolute: 0.4 10*3/uL (ref 0.1–1.0)
Monocytes Relative: 7 %
Neutro Abs: 3.7 10*3/uL (ref 1.7–7.7)
Neutrophils Relative %: 72 %
Platelets: 190 10*3/uL (ref 150–400)
RBC: 4.25 MIL/uL (ref 3.87–5.11)
RDW: 14.1 % (ref 11.5–15.5)
WBC: 5.1 10*3/uL (ref 4.0–10.5)
nRBC: 0 % (ref 0.0–0.2)

## 2023-02-03 LAB — CBG MONITORING, ED: Glucose-Capillary: 186 mg/dL — ABNORMAL HIGH (ref 70–99)

## 2023-02-03 LAB — GLUCOSE, CAPILLARY: Glucose-Capillary: 269 mg/dL — ABNORMAL HIGH (ref 70–99)

## 2023-02-03 MED ORDER — ACETAMINOPHEN 650 MG RE SUPP: 650.0000 mg | Freq: Four times a day (QID) | RECTAL | Status: DC | PRN

## 2023-02-03 MED ORDER — FUROSEMIDE 40 MG PO TABS
40.0000 mg | ORAL_TABLET | Freq: Every day | ORAL | Status: DC
  Administered 2023-02-04 – 2023-02-06 (×3): 40 mg via ORAL
  Filled 2023-02-03 (×3): qty 1

## 2023-02-03 MED ORDER — SODIUM CHLORIDE 0.9 % IV SOLN
2.0000 g | Freq: Once | INTRAVENOUS | Status: AC
  Administered 2023-02-03: 2 g via INTRAVENOUS
  Filled 2023-02-03: qty 12.5

## 2023-02-03 MED ORDER — LEVOTHYROXINE SODIUM 75 MCG PO TABS
75.0000 ug | ORAL_TABLET | Freq: Every day | ORAL | Status: DC
  Administered 2023-02-04 – 2023-02-06 (×3): 75 ug via ORAL
  Filled 2023-02-03 (×3): qty 1

## 2023-02-03 MED ORDER — VANCOMYCIN HCL IN DEXTROSE 1-5 GM/200ML-% IV SOLN: 1000.0000 mg | Freq: Once | INTRAVENOUS | Status: DC

## 2023-02-03 MED ORDER — VANCOMYCIN HCL 1750 MG/350ML IV SOLN: 1750.0000 mg | Freq: Once | INTRAVENOUS | Status: DC

## 2023-02-03 MED ORDER — INSULIN ASPART 100 UNIT/ML IJ SOLN
0.0000 [IU] | Freq: Every day | INTRAMUSCULAR | Status: DC
  Administered 2023-02-03: 3 [IU] via SUBCUTANEOUS
  Administered 2023-02-05: 2 [IU] via SUBCUTANEOUS

## 2023-02-03 MED ORDER — SODIUM CHLORIDE 0.9 % IV SOLN
2.0000 g | INTRAVENOUS | Status: DC
  Administered 2023-02-04 – 2023-02-05 (×2): 2 g via INTRAVENOUS
  Filled 2023-02-03 (×2): qty 20

## 2023-02-03 MED ORDER — GABAPENTIN 300 MG PO CAPS
300.0000 mg | ORAL_CAPSULE | Freq: Three times a day (TID) | ORAL | Status: DC
  Administered 2023-02-03 – 2023-02-06 (×8): 300 mg via ORAL
  Filled 2023-02-03 (×8): qty 1

## 2023-02-03 MED ORDER — LOSARTAN POTASSIUM 50 MG PO TABS
25.0000 mg | ORAL_TABLET | Freq: Every day | ORAL | Status: DC
  Administered 2023-02-04 – 2023-02-06 (×3): 25 mg via ORAL
  Filled 2023-02-03 (×3): qty 1

## 2023-02-03 MED ORDER — VANCOMYCIN HCL IN DEXTROSE 1-5 GM/200ML-% IV SOLN: 1000.0000 mg | INTRAVENOUS | Status: DC

## 2023-02-03 MED ORDER — VANCOMYCIN HCL IN DEXTROSE 1-5 GM/200ML-% IV SOLN
1000.0000 mg | Freq: Once | INTRAVENOUS | Status: AC
  Administered 2023-02-03: 1000 mg via INTRAVENOUS
  Filled 2023-02-03: qty 200

## 2023-02-03 MED ORDER — ROSUVASTATIN CALCIUM 10 MG PO TABS
5.0000 mg | ORAL_TABLET | Freq: Every day | ORAL | Status: DC
  Administered 2023-02-04 – 2023-02-06 (×3): 5 mg via ORAL
  Filled 2023-02-03 (×3): qty 1

## 2023-02-03 MED ORDER — CLOPIDOGREL BISULFATE 75 MG PO TABS
75.0000 mg | ORAL_TABLET | Freq: Every day | ORAL | Status: DC
  Administered 2023-02-04 – 2023-02-06 (×3): 75 mg via ORAL
  Filled 2023-02-03 (×3): qty 1

## 2023-02-03 MED ORDER — POLYETHYLENE GLYCOL 3350 17 G PO PACK: 17.0000 g | PACK | Freq: Every day | ORAL | Status: DC | PRN

## 2023-02-03 MED ORDER — POTASSIUM CHLORIDE CRYS ER 20 MEQ PO TBCR
40.0000 meq | EXTENDED_RELEASE_TABLET | Freq: Once | ORAL | Status: AC
  Administered 2023-02-03: 40 meq via ORAL
  Filled 2023-02-03: qty 2

## 2023-02-03 MED ORDER — ACETAMINOPHEN 325 MG PO TABS
650.0000 mg | ORAL_TABLET | Freq: Four times a day (QID) | ORAL | Status: DC | PRN
  Administered 2023-02-04: 650 mg via ORAL
  Filled 2023-02-03: qty 2

## 2023-02-03 MED ORDER — HEPARIN SODIUM (PORCINE) 5000 UNIT/ML IJ SOLN
5000.0000 [IU] | Freq: Three times a day (TID) | INTRAMUSCULAR | Status: DC
  Administered 2023-02-03 – 2023-02-06 (×8): 5000 [IU] via SUBCUTANEOUS
  Filled 2023-02-03 (×8): qty 1

## 2023-02-03 MED ORDER — VANCOMYCIN HCL 750 MG/150ML IV SOLN
750.0000 mg | Freq: Once | INTRAVENOUS | Status: DC
  Filled 2023-02-03: qty 150

## 2023-02-03 MED ORDER — INSULIN ASPART 100 UNIT/ML IJ SOLN
0.0000 [IU] | Freq: Three times a day (TID) | INTRAMUSCULAR | Status: DC
  Administered 2023-02-04: 3 [IU] via SUBCUTANEOUS
  Administered 2023-02-04: 1 [IU] via SUBCUTANEOUS
  Administered 2023-02-04: 2 [IU] via SUBCUTANEOUS
  Administered 2023-02-05: 1 [IU] via SUBCUTANEOUS
  Administered 2023-02-05 (×2): 2 [IU] via SUBCUTANEOUS
  Administered 2023-02-06: 1 [IU] via SUBCUTANEOUS

## 2023-02-03 MED ORDER — NYSTATIN 100000 UNIT/GM EX POWD
Freq: Two times a day (BID) | CUTANEOUS | Status: DC
  Filled 2023-02-03 (×4): qty 15

## 2023-02-03 MED ORDER — ASPIRIN 81 MG PO TBEC
81.0000 mg | DELAYED_RELEASE_TABLET | Freq: Every day | ORAL | Status: DC
  Administered 2023-02-04 – 2023-02-06 (×3): 81 mg via ORAL
  Filled 2023-02-03 (×3): qty 1

## 2023-02-03 MED ORDER — HYDROCODONE-ACETAMINOPHEN 5-325 MG PO TABS
1.0000 | ORAL_TABLET | Freq: Four times a day (QID) | ORAL | Status: DC | PRN
  Administered 2023-02-03 – 2023-02-06 (×9): 1 via ORAL
  Filled 2023-02-03 (×9): qty 1

## 2023-02-03 NOTE — Assessment & Plan Note (Signed)
Diet controlled. - HgbA1c - SSI - S

## 2023-02-03 NOTE — Progress Notes (Signed)
Pharmacy Antibiotic Note  Kristen Garner is a 72 y.o. female admitted on 02/03/2023 with worsening R heel wound/ulcer. Recently was started on PO antibiotics without improvement. Pharmacy has been consulted for vancomycin dosing.  Hx CKD - Scr at 2, appears near baseline (~1.9). WBC wnl, afebrile.  Plan: Vancomycin '1750mg'$  IV x1, followed by Vancomycin '1000mg'$  IV q48 hours (eAUC 475, Scr 2, Vd 0.5) Ceftriaxone 2g IV q24 hours per MD (starting 3/13 PM) Follow-up clinical improvement, renal function, vanc levels as needed  Height: '5\' 6"'$  (167.6 cm) Weight: 87.1 kg (192 lb) IBW/kg (Calculated) : 59.3  Temp (24hrs), Avg:98 F (36.7 C), Min:98 F (36.7 C), Max:98 F (36.7 C)  Recent Labs  Lab 01/30/23 1223 02/03/23 1317  WBC 6.0 5.1  CREATININE 2.02* 2.00*    Estimated Creatinine Clearance: 28.3 mL/min (A) (by C-G formula based on SCr of 2 mg/dL (H)).    Allergies  Allergen Reactions   Adhesive [Tape] Rash   Codeine Itching   Penicillins Other (See Comments)    Child hood allergy Has patient had a PCN reaction causing immediate rash, facial/tongue/throat swelling, SOB or lightheadedness with hypotension: Unknown Has patient had a PCN reaction causing severe rash involving mucus membranes or skin necrosis: Unknown Has patient had a PCN reaction that required hospitalization: No Has patient had a PCN reaction occurring within the last 10 years: No If all of the above answers are "NO", then may proceed with Cephalosporin use.    Sulfonamide Derivatives Itching   Iodine     Patient states that she got a cat scratch and her mom put iodine on it and then got catch scratch fever    Antimicrobials this admission: Cefepime x1 in ED on 3/12 Vancomycin 3/12 >>  Ceftriaxone 3/13 >>  Dose adjustments this admission:  Microbiology results: None thus far  Thank you for allowing pharmacy to be a part of this patient's care.  Dimple Nanas, PharmD, BCPS 02/03/2023 6:10 PM

## 2023-02-03 NOTE — Assessment & Plan Note (Signed)
Stable. -Resume aspirin, Plavix.

## 2023-02-03 NOTE — ED Triage Notes (Signed)
Patient was sent by PCP for infection to right heel.  Last pic in computer was from 3/7 but PCP thinks it looks bad and needs IV antibiotics

## 2023-02-03 NOTE — Assessment & Plan Note (Addendum)
Stable and compensated.  History of ischemic cardiomyopathy.  Last echo 2018, EF 20 to 25%. -Resume aspirin, Plavix, Lasix 40 mg daily -She does not think she is on amiodarone any more which was started for atrial tachycardia

## 2023-02-03 NOTE — Assessment & Plan Note (Addendum)
Diabetic foot ulcer.  Right lower extremity secondary to right heel ulcer.  Ulcer present for 6 months.  Now with increasing tenderness, erythema, drainage.  Rules out for sepsis.  WBC 5.1.  Temperature 98.  Wound appears scabbed over.  X-ray- X-ray shows mild irregularity that appears superficial at least 1.7 cm away from the calcaneus, unchanged from recent prior radiographs.  No definite cortical erosion to suggest osteomyelitis.  Completed just 3 days of doxycycline, did not get Keflex as pharmacy was out of stock. -Continue IV vancomycin and ceftriaxone -Doubt need for further imaging at this time -Wound Care consult - Hydrocodone- acetaminophen every 6 hourly as needed

## 2023-02-03 NOTE — ED Provider Notes (Signed)
[facility] Provider Note   CSN: [id number] Arrival date & time: 02/03/23  1130     History  Chief Complaint  Patient presents with   Wound Infection    Kristen Garner is a 72 y.o. female.  Patient with c/o infection to right heel wound. Wound/ulcer has been there for past several months, constant. Recently foot has become hot/red and with some drainage. Last week pts renal physician referred to ED for possible admission  - was started on augmentin then and doing no better. No fever or chills.   The history is provided by the patient, medical records and the EMS personnel.       Home Medications Prior to Admission medications   Medication Sig Start Date End Date Taking? Authorizing Provider  albuterol (PROVENTIL HFA;VENTOLIN HFA) 108 (90 Base) MCG/ACT inhaler Inhale 2 puffs into the lungs every 4 (four) hours as needed for wheezing or shortness of breath. 03/28/17   Orpah Greek, MD  amiodarone (PACERONE) 200 MG tablet Take 1 tablet (200 mg total) 2 (two) times daily by mouth. Patient not taking: Reported on 01/15/2019 10/07/17   Kathie Dike, MD  aspirin EC 81 MG EC tablet Take 1 tablet (81 mg total) by mouth daily. 10/16/16   Strader, Fransisco Hertz, PA-C  calcium carbonate (ANTACID) 420 MG CHEW chewable tablet Chew 420 mg daily as needed by mouth for indigestion or heartburn.    [provider]  cephALEXin (KEFLEX) 500 MG capsule Take 1 capsule (500 mg total) by mouth 4 (four) times daily for 10 days. 01/30/23 02/09/23  Theressa Stamps R, PA  clopidogrel (PLAVIX) 75 MG tablet Take 1 tablet (75 mg total) daily by mouth. 10/07/17   Kathie Dike, MD  doxycycline (VIBRAMYCIN) 100 MG capsule Take 1 capsule (100 mg total) by mouth 2 (two) times daily. 01/30/23   Clark, Meghan R, PA  escitalopram (LEXAPRO) 10 MG tablet Take 10 mg daily by mouth. 07/16/17   [provider]  furosemide (LASIX) 40 MG tablet Take 1 tablet  (40 mg total) daily by mouth. 10/07/17   Kathie Dike, MD  HYDROcodone-acetaminophen (NORCO/VICODIN) 5-325 MG tablet Take one tab po q 6 hrs prn pain Patient taking differently: Take 1-2 tablets by mouth every 6 (six) hours as needed for moderate pain. Take one tab po q 6 hrs prn pain 07/12/17   Triplett, Tammy, PA-C  levothyroxine (SYNTHROID, LEVOTHROID) 50 MCG tablet Take 1 tablet by mouth daily. 12/01/18   [provider]  metoprolol succinate (TOPROL-XL) 100 MG 24 hr tablet Take 1 tablet (100 mg total) 2 (two) times daily by mouth. Take with or immediately following a meal. Patient not taking: Reported on 01/15/2019 10/07/17   Kathie Dike, MD  metoprolol succinate (TOPROL-XL) 50 MG 24 hr tablet Take 1 tablet by mouth 2 (two) times daily. 12/01/18   [provider]  nitroGLYCERIN (NITROSTAT) 0.4 MG SL tablet Place 0.4 mg under the tongue every 5 (five) minutes as needed for chest pain.    [provider]  pantoprazole (PROTONIX) 40 MG tablet Take 1 tablet (40 mg total) by mouth daily. 10/31/17   Orson Eva, MD  Potassium Chloride ER 20 MEQ TBCR Take 20 mEq by mouth daily. 11/12/16   Deboraha Sprang, MD  rOPINIRole (REQUIP) 1 MG tablet Take 1 mg by mouth 3 (three) times daily.  08/17/17   [provider]  rosuvastatin (CRESTOR) 5 MG tablet Take 1 tablet (5 mg  total) daily by mouth. 10/07/17 01/15/19  Kathie Dike, MD  sertraline (ZOLOFT) 25 MG tablet Take 1 tablet by mouth daily. 11/03/18   [provider]  Vitamin D, Ergocalciferol, (DRISDOL) 1.25 MG (50000 UT) CAPS capsule Take 1 capsule by mouth once a week. saturdays 12/02/18   [provider]  zolpidem (AMBIEN) 10 MG tablet Take 10 mg by mouth at bedtime.    [provider]      Allergies    Adhesive [tape], Codeine, Penicillins, Sulfonamide derivatives, and Iodine    Review of Systems   Review of Systems  Constitutional:  Negative for chills and fever.  Eyes:  Negative for  redness.  Respiratory:  Negative for shortness of breath.   Cardiovascular:  Negative for chest pain.  Gastrointestinal:  Negative for abdominal pain, nausea and vomiting.  Genitourinary:  Negative for flank pain.  Musculoskeletal:  Negative for back pain and neck pain.  Skin:  Negative for rash.  Neurological:  Negative for headaches.  Hematological:  Does not bruise/bleed easily.  Psychiatric/Behavioral:  Negative for confusion.     Physical Exam Updated Vital Signs BP (!) 148/56 (BP Location: Left Arm)   Pulse 70   Temp 98 F (36.7 C) (Oral)   Resp 18   Ht 1.676 m ('5\' 6"'$ )   Wt 87.1 kg   SpO2 97%   BMI 30.99 kg/m  Physical Exam Vitals and nursing note reviewed.  Constitutional:      Appearance: Normal appearance. She is well-developed.  HENT:     Head: Atraumatic.     Nose: Nose normal.     Mouth/Throat:     Mouth: Mucous membranes are moist.  Eyes:     General: No scleral icterus.    Conjunctiva/sclera: Conjunctivae normal.  Neck:     Trachea: No tracheal deviation.  Cardiovascular:     Rate and Rhythm: Normal rate.     Pulses: Normal pulses.  Pulmonary:     Effort: Pulmonary effort is normal. No respiratory distress.  Musculoskeletal:     Cervical back: Neck supple. No muscular tenderness.     Comments: Right heel ulcer/scab with scant purulent drainage. See photos. Surrounding erythema and increased warmth c/w cellulitis. Distal pulses palp. Normal cap refill in toes.   Skin:    General: Skin is warm and dry.     Findings: No rash.  Neurological:     Mental Status: She is alert.     Comments: Alert, speech normal.   Psychiatric:        Mood and Affect: Mood normal.        ED Results / Procedures / Treatments   Labs (all labs ordered are listed, but only abnormal results are displayed) Results for orders placed or performed during the hospital encounter of 02/03/23  CBC with Differential  Result Value Ref Range   WBC 5.1 4.0 - 10.5 K/uL   RBC  4.25 3.87 - 5.11 MIL/uL   Hemoglobin 11.8 (L) 12.0 - 15.0 g/dL   HCT 37.9 36.0 - 46.0 %   MCV 89.2 80.0 - 100.0 fL   MCH 27.8 26.0 - 34.0 pg   MCHC 31.1 30.0 - 36.0 g/dL   RDW 14.1 11.5 - 15.5 %   Platelets 190 150 - 400 K/uL   nRBC 0.0 0.0 - 0.2 %   Neutrophils Relative % 72 %   Neutro Abs 3.7 1.7 - 7.7 K/uL   Lymphocytes Relative 13 %   Lymphs Abs 0.7 0.7 - 4.0  K/uL   Monocytes Relative 7 %   Monocytes Absolute 0.4 0.1 - 1.0 K/uL   Eosinophils Relative 7 %   Eosinophils Absolute 0.3 0.0 - 0.5 K/uL   Basophils Relative 1 %   Basophils Absolute 0.1 0.0 - 0.1 K/uL   Immature Granulocytes 0 %   Abs Immature Granulocytes 0.02 0.00 - 0.07 K/uL  Comprehensive metabolic panel  Result Value Ref Range   Sodium 140 135 - 145 mmol/L   Potassium 3.6 3.5 - 5.1 mmol/L   Chloride 104 98 - 111 mmol/L   CO2 26 22 - 32 mmol/L   Glucose, Bld 150 (H) 70 - 99 mg/dL   BUN 33 (H) 8 - 23 mg/dL   Creatinine, Ser 2.00 (H) 0.44 - 1.00 mg/dL   Calcium 8.6 (L) 8.9 - 10.3 mg/dL   Total Protein 7.2 6.5 - 8.1 g/dL   Albumin 3.5 3.5 - 5.0 g/dL   AST 33 15 - 41 U/L   ALT 18 0 - 44 U/L   Alkaline Phosphatase 110 38 - 126 U/L   Total Bilirubin 0.5 0.3 - 1.2 mg/dL   GFR, Estimated 26 (L) >60 mL/min   Anion gap 10 5 - 15  POC CBG, ED  Result Value Ref Range   Glucose-Capillary 186 (H) 70 - 99 mg/dL   DG Foot Complete Right  Result Date: 02/03/2023 CLINICAL DATA:  Wound on right heel for 6 months.  Right foot pain. EXAM: RIGHT FOOT COMPLETE - 3+ VIEW COMPARISON:  Right foot radiographs 01/30/2023 FINDINGS: There is diffuse decreased bone mineralization. Moderate plantar and posterior calcaneal heel spurs are again seen. There is mild irregularity of the posteroinferior heel soft tissues, appearing superficial and at least 1.7 cm away from the calcaneus. Mild dorsal talonavicular degenerative osteophytosis. Mild-to-moderate second through fifth interphalangeal and mild second tarsometatarsal joint space  narrowing. No acute fracture is seen. No dislocation. No definite cortical erosion. IMPRESSION: Mild irregularity of the posteroinferior heel soft tissues, appearing superficial and at least 1.7 cm away from the calcaneus, unchanged from recent prior radiographs. No definite cortical erosion to suggest osteomyelitis. Electronically Signed   By: Yvonne Kendall M.D.   On: 02/03/2023 14:33   DG Foot Complete Right  Result Date: 01/30/2023 CLINICAL DATA:  Heel wound. EXAM: RIGHT FOOT COMPLETE - 3 VIEW COMPARISON:  None Available. FINDINGS: Osteopenia. Diffuse soft tissue swelling. No fracture or dislocation. Moderate well corticated plantar and Achilles calcaneal spurs. No definite erosive changes. There is a focal soft tissue irregularity on the lateral view dorsal and caudal to the calcaneus. Overall if there is further concern of osteomyelitis, additional workup with MRI or bone scan can be performed for further sensitivity as clinically appropriate IMPRESSION: Soft tissue swelling with subtle soft tissue defect dorsal and caudal to the calcaneus on lateral view. Osteopenia.  Calcaneal spurs. Electronically Signed   By: Jill Side M.D.   On: 01/30/2023 12:15    EKG None  Radiology DG Foot Complete Right  Result Date: 02/03/2023 CLINICAL DATA:  Wound on right heel for 6 months.  Right foot pain. EXAM: RIGHT FOOT COMPLETE - 3+ VIEW COMPARISON:  Right foot radiographs 01/30/2023 FINDINGS: There is diffuse decreased bone mineralization. Moderate plantar and posterior calcaneal heel spurs are again seen. There is mild irregularity of the posteroinferior heel soft tissues, appearing superficial and at least 1.7 cm away from the calcaneus. Mild dorsal talonavicular degenerative osteophytosis. Mild-to-moderate second through fifth interphalangeal and mild second tarsometatarsal joint space narrowing. No acute  fracture is seen. No dislocation. No definite cortical erosion. IMPRESSION: Mild irregularity of the  posteroinferior heel soft tissues, appearing superficial and at least 1.7 cm away from the calcaneus, unchanged from recent prior radiographs. No definite cortical erosion to suggest osteomyelitis. Electronically Signed   By: Yvonne Kendall M.D.   On: 02/03/2023 14:33    Procedures Procedures    Medications Ordered in ED Medications - No data to display  ED Course/ Medical Decision Making/ A&P                             Medical Decision Making Problems Addressed: Cellulitis of right foot: acute illness or injury with systemic symptoms that poses a threat to life or bodily functions Diabetic foot infection ([place]): acute illness or injury with systemic symptoms that poses a threat to life or bodily functions Heel ulcer, right, with unspecified severity ([place]): chronic illness or injury with exacerbation, progression, or side effects of treatment that poses a threat to life or bodily functions Stage 4 chronic kidney disease ([place]): chronic illness or injury that poses a threat to life or bodily functions  Amount and/or Complexity of Data Reviewed External Data Reviewed: labs and notes. Labs: ordered. Decision-making details documented in ED Course. Radiology: ordered and independent interpretation performed. Decision-making details documented in ED Course. Discussion of management or test interpretation with external provider(s): medicine  Risk Prescription drug management. Decision regarding hospitalization.   Iv ns. Continuous pulse ox and cardiac monitoring. Labs ordered/sent. Imaging ordered.   Differential diagnosis includes cellulitis, diabetic foot infection, osteomyelitis, etc . Dispo decision including potential need for admission considered - will get labs and imaging and reassess.   Reviewed nursing notes and prior charts for additional history. External reports reviewed.   Cardiac monitor: sinus rhythm, rate 70.  Labs reviewed/interpreted by me -  wbc normal.   Xrays  reviewed/interpreted by me - no def osteo.   Iv abx given.   Given diabetic foot wound/infection, not getting better on outpatient oral abx, will consult hospitalists for admission.           Final Clinical Impression(s) / ED Diagnoses Final diagnoses:  Diabetic foot infection ([place])  Heel ulcer, right, with unspecified severity ([place])  Stage 4 chronic kidney disease ([place])  Cellulitis of right foot    Rx / DC Orders ED Discharge Orders     None         Lajean Saver, MD 02/03/23 1527

## 2023-02-03 NOTE — Assessment & Plan Note (Signed)
Creatinine 2.  At baseline.

## 2023-02-03 NOTE — ED Provider Triage Note (Signed)
Emergency Medicine Provider Triage Evaluation Note  CLYDA LUDY , a 72 y.o. female  was evaluated in triage.  Pt complains of right ankle wound check.  Patient states wound has been present for years and she has not had anyone to follow-up on it.  Patient states wound is getting worse and it is draining brown.  Patient denied any history of diabetes or immunocompromise state.  Review of Systems  Positive: See HPI Negative: See HPI  Physical Exam  BP (!) 148/56 (BP Location: Left Arm)   Pulse 70   Temp 98 F (36.7 C) (Oral)   Resp 18   Ht '5\' 6"'$  (1.676 m)   Wt 87.1 kg   SpO2 97%   BMI 30.99 kg/m  Gen:   Awake, no distress   Resp:  Normal effort  MSK:   Moves extremities without difficulty  Other:  2+ bilateral dorsalis pedis pulses with regular rate, sensation intact distally, patient able to wiggle toes, 1+ pitting edema bilaterally  Medical Decision Making  Medically screening exam initiated at 1:55 PM.  Appropriate orders placed.  LONNIE IWINSKI was informed that the remainder of the evaluation will be completed by another provider, this initial triage assessment does not replace that evaluation, and the importance of remaining in the ED until their evaluation is complete.  Workup initiated, patient stable at this time   Elvina Sidle 02/03/23 1400

## 2023-02-03 NOTE — Assessment & Plan Note (Signed)
Stable. -Resume losartan - She does not know what medications she takes, unknown if she is still taking metoprolol

## 2023-02-03 NOTE — H&P (Signed)
History and Physical    Kristen Garner [id number] DOB: December 23, 1950 DOA: 02/03/2023  PCP: Sharilyn Sites, MD   Patient coming from: Home  I have personally briefly reviewed patient's old medical records in [place]  Chief Complaint: Right heel Ulcer and drainage  HPI: Kristen Garner is a 72 y.o. female with medical history significant for systolic and diastolic CHF, coronary artery disease, cardiomyopathy, hypertension, diabetes mellitus, CKD 3. Patient presented to the ED with complaints of worsening pain to her right heel, with drainage.  Patient has had ulcer to her right heel for about 6 months now, but with onset of worsening pain, and brownish drainage, she presented to the ED 3/8, she was prescribed a course of antibiotics Keflex and doxycycline.  She tells me the pharmacy was out of Augmentin so she was given only doxycycline.  She has taken this 2 times daily for the past 3 days, before presentation today. Denies fevers or chills.  ED Course: Stable vitals.  Afebrile temperature 98.  WBC 5.1.  X-ray shows mild irregularity that appears superficial at least 1.7 cm away from the calcaneus, unchanged from recent prior radiographs.  No definite cortical erosion to suggest osteomyelitis. IV Vanco and cefepime started.  Hospitalist to admit.  Review of Systems: As per HPI all other systems reviewed and negative.  Past Medical History:  Diagnosis Date   Abnormal chest CT    a. CTA 09/2016: ""1.4 cm aortopulmonary window node seen, of uncertain significance"   Anxiety    Arthritis    Breast cancer ([place])    right   Breast cancer (HCC)    CAD (coronary artery disease)    a. 09/2016: cath showing 3-v disease with severe stenosis along RCA, CTO of PL branch, mild LAD stenosis, and moderate D1 stenosis. S/p DES x2 to RCA on 10/14/2016.   Chronic systolic CHF (congestive heart failure) ([place])    a. 09/2016: Echo showing EF of 20-25% with diffuse HK   CKD (chronic kidney  disease), stage III ([place])    Depression    Diabetes mellitus    takes only the pill   Headache(784.0)    occiasional migraine   Hyperlipidemia    Hypertension    Hypokalemia    Noncompliance 01/04/2015   Osteopenia 01/04/2015   PAT (paroxysmal atrial tachycardia)    PVC's (premature ventricular contractions)    Shingles    Skin cancer 2015   right leg    Past Surgical History:  Procedure Laterality Date   ABDOMINAL HYSTERECTOMY     BREAST BIOPSY Right    biopsy then wide excision    CARDIAC CATHETERIZATION N/A 10/13/2016   Procedure: Left Heart Cath and Coronary Angiography;  Surgeon: Burnell Blanks, MD;  Location: [facility];  Service: Cardiovascular;  Laterality: N/A;   CARDIAC CATHETERIZATION N/A 10/14/2016   Procedure: Coronary Stent Intervention;  Surgeon: Burnell Blanks, MD;  Location: [facility];  Service: Cardiovascular;  Laterality: N/A;   CHOLECYSTECTOMY     CORONARY ANGIOPLASTY WITH STENT PLACEMENT  10/14/2016   ESOPHAGOGASTRODUODENOSCOPY N/A 10/31/2017   Procedure: ESOPHAGOGASTRODUODENOSCOPY (EGD);  Surgeon: Daneil Dolin, MD;  [facility];  Service: Endoscopy;  Laterality: N/A;   PARTIAL MASTECTOMY WITH AXILLARY SENTINEL LYMPH NODE BIOPSY     SENTINEL LYMPH NODE BIOPSY     SKIN CANCER EXCISION Right 2015     reports that she has quit smoking. She has never used smokeless tobacco. She reports that she  does not drink alcohol and does not use drugs.  Allergies  Allergen Reactions   Adhesive [Tape] Rash   Codeine Itching   Penicillins Other (See Comments)    Child hood allergy Has patient had a PCN reaction causing immediate rash, facial/tongue/throat swelling, SOB or lightheadedness with hypotension: Unknown Has patient had a PCN reaction causing severe rash involving mucus membranes or skin necrosis: Unknown Has patient had a PCN reaction that required hospitalization: No Has patient had a PCN reaction occurring  within the last 10 years: No If all of the above answers are "NO", then may proceed with Cephalosporin use.    Sulfonamide Derivatives Itching   Iodine     Patient states that she got a cat scratch and her mom put iodine on it and then got catch scratch fever    Family History  Problem Relation Age of Onset   Diabetes Father    Stroke Maternal Grandmother     Prior to Admission medications   Medication Sig Start Date End Date Taking? Authorizing Provider  albuterol (PROVENTIL HFA;VENTOLIN HFA) 108 (90 Base) MCG/ACT inhaler Inhale 2 puffs into the lungs every 4 (four) hours as needed for wheezing or shortness of breath. 03/28/17   Orpah Greek, MD  amiodarone (PACERONE) 200 MG tablet Take 1 tablet (200 mg total) 2 (two) times daily by mouth. Patient not taking: Reported on 01/15/2019 10/07/17   Kathie Dike, MD  aspirin EC 81 MG EC tablet Take 1 tablet (81 mg total) by mouth daily. 10/16/16   Strader, Fransisco Hertz, PA-C  calcium carbonate (ANTACID) 420 MG CHEW chewable tablet Chew 420 mg daily as needed by mouth for indigestion or heartburn.    [provider]  cephALEXin (KEFLEX) 500 MG capsule Take 1 capsule (500 mg total) by mouth 4 (four) times daily for 10 days. 01/30/23 02/09/23  Theressa Stamps R, PA  clopidogrel (PLAVIX) 75 MG tablet Take 1 tablet (75 mg total) daily by mouth. 10/07/17   Kathie Dike, MD  doxycycline (VIBRAMYCIN) 100 MG capsule Take 1 capsule (100 mg total) by mouth 2 (two) times daily. 01/30/23   Clark, Meghan R, PA  escitalopram (LEXAPRO) 10 MG tablet Take 10 mg daily by mouth. 07/16/17   [provider]  furosemide (LASIX) 40 MG tablet Take 1 tablet (40 mg total) daily by mouth. 10/07/17   Kathie Dike, MD  HYDROcodone-acetaminophen (NORCO/VICODIN) 5-325 MG tablet Take one tab po q 6 hrs prn pain Patient taking differently: Take 1-2 tablets by mouth every 6 (six) hours as needed for moderate pain. Take one tab po q 6 hrs prn pain 07/12/17    Triplett, Tammy, PA-C  levothyroxine (SYNTHROID, LEVOTHROID) 50 MCG tablet Take 1 tablet by mouth daily. 12/01/18   [provider]  metoprolol succinate (TOPROL-XL) 100 MG 24 hr tablet Take 1 tablet (100 mg total) 2 (two) times daily by mouth. Take with or immediately following a meal. Patient not taking: Reported on 01/15/2019 10/07/17   Kathie Dike, MD  metoprolol succinate (TOPROL-XL) 50 MG 24 hr tablet Take 1 tablet by mouth 2 (two) times daily. 12/01/18   [provider]  nitroGLYCERIN (NITROSTAT) 0.4 MG SL tablet Place 0.4 mg under the tongue every 5 (five) minutes as needed for chest pain.    [provider]  pantoprazole (PROTONIX) 40 MG tablet Take 1 tablet (40 mg total) by mouth daily. 10/31/17   Orson Eva, MD  Potassium Chloride ER 20 MEQ TBCR Take 20 mEq by mouth  daily. 11/12/16   Deboraha Sprang, MD  rOPINIRole (REQUIP) 1 MG tablet Take 1 mg by mouth 3 (three) times daily.  08/17/17   [provider]  rosuvastatin (CRESTOR) 5 MG tablet Take 1 tablet (5 mg total) daily by mouth. 10/07/17 01/15/19  Kathie Dike, MD  sertraline (ZOLOFT) 25 MG tablet Take 1 tablet by mouth daily. 11/03/18   [provider]  Vitamin D, Ergocalciferol, (DRISDOL) 1.25 MG (50000 UT) CAPS capsule Take 1 capsule by mouth once a week. saturdays 12/02/18   [provider]  zolpidem (AMBIEN) 10 MG tablet Take 10 mg by mouth at bedtime.    [provider]    Physical Exam: Vitals:   02/03/23 1258 02/03/23 1259  BP: (!) 148/56   Pulse: 70   Resp: 18   Temp: 98 F (36.7 C)   TempSrc: Oral   SpO2: 97%   Weight:  87.1 kg  Height:  '5\' 6"'$  (1.676 m)    Constitutional: NAD, calm, comfortable Vitals:   02/03/23 1258 02/03/23 1259  BP: (!) 148/56   Pulse: 70   Resp: 18   Temp: 98 F (36.7 C)   TempSrc: Oral   SpO2: 97%   Weight:  87.1 kg  Height:  '5\' 6"'$  (1.676 m)   Eyes: PERRL, lids and conjunctivae normal ENMT: Mucous membranes are moist.    Neck: normal, supple, no masses, no thyromegaly Respiratory: clear to auscultation bilaterally, no wheezing, no crackles. Normal respiratory effort. No accessory muscle use.  Cardiovascular: Regular rate and rhythm, no murmurs / rubs / gallops. No extremity edema.  Extremities warm.  Pacemaker to right upper chest. Abdomen: no tenderness, no masses palpated. No hepatosplenomegaly. Bowel sounds positive.  Musculoskeletal: no clubbing / cyanosis. No joint deformity upper and lower extremities.  Skin: ~ 4cm horizontal hold center to heel of right foot, no drainage at this time, appears scabbed over, quite tender to light palpation, erythema to heel extending towards ankle compared to left foot, mild differential warmth Neurologic: No apparent cranial nerve abnormality, moving extremities spontaneously. Psychiatric: Normal judgment and insight. Alert and oriented x 3. Normal mood.        Labs on Admission: I have personally reviewed following labs and imaging studies  CBC: Recent Labs  Lab 01/30/23 1223 02/03/23 1317  WBC 6.0 5.1  NEUTROABS 4.0 3.7  HGB 12.3 11.8*  HCT 39.6 37.9  MCV 90.8 89.2  PLT 172 99991111   Basic Metabolic Panel: Recent Labs  Lab 01/30/23 1223 02/03/23 1317  NA 138 140  K 3.5 3.6  CL 99 104  CO2 27 26  GLUCOSE 144* 150*  BUN 32* 33*  CREATININE 2.02* 2.00*  CALCIUM 8.7* 8.6*   GFR: Estimated Creatinine Clearance: 28.3 mL/min (A) (by C-G formula based on SCr of 2 mg/dL (H)). Liver Function Tests: Recent Labs  Lab 02/03/23 1317  AST 33  ALT 18  ALKPHOS 110  BILITOT 0.5  PROT 7.2  ALBUMIN 3.5   CBG: Recent Labs  Lab 01/30/23 1132 02/03/23 1301  GLUCAP 132* 186*   Radiological Exams on Admission: DG Foot Complete Right  Result Date: 02/03/2023 CLINICAL DATA:  Wound on right heel for 6 months.  Right foot pain. EXAM: RIGHT FOOT COMPLETE - 3+ VIEW COMPARISON:  Right foot radiographs 01/30/2023 FINDINGS: There is diffuse decreased bone  mineralization. Moderate plantar and posterior calcaneal heel spurs are again seen. There is mild irregularity of the posteroinferior heel soft tissues, appearing superficial and at least 1.7  cm away from the calcaneus. Mild dorsal talonavicular degenerative osteophytosis. Mild-to-moderate second through fifth interphalangeal and mild second tarsometatarsal joint space narrowing. No acute fracture is seen. No dislocation. No definite cortical erosion. IMPRESSION: Mild irregularity of the posteroinferior heel soft tissues, appearing superficial and at least 1.7 cm away from the calcaneus, unchanged from recent prior radiographs. No definite cortical erosion to suggest osteomyelitis. Electronically Signed   By: Yvonne Kendall M.D.   On: 02/03/2023 14:33    [id number].  Assessment/Plan Principal Problem:   Cellulitis of right lower extremity Active Problems:   H/O atrial tachycardia   Essential hypertension   Cardiomyopathy, ischemic   CAD (coronary artery disease)   Chronic combined systolic and diastolic CHF (congestive heart failure) (HCC)   Diabetes (HCC)   CKD (chronic kidney disease) stage 4, GFR 15-29 ml/min (HCC)   Assessment and Plan: * Cellulitis of right lower extremity Diabetic foot ulcer.  Right lower extremity secondary to right heel ulcer.  Ulcer present for 6 months.  Now with increasing tenderness, erythema, drainage.  Rules out for sepsis.  WBC 5.1.  Temperature 98.  Wound appears scabbed over.  X-ray- X-ray shows mild irregularity that appears superficial at least 1.7 cm away from the calcaneus, unchanged from recent prior radiographs.  No definite cortical erosion to suggest osteomyelitis.  Completed just 3 days of doxycycline, did not get Keflex as pharmacy was out of stock. -Continue IV vancomycin and ceftriaxone -Doubt need for further imaging at this time -Wound Care consult - Hydrocodone- acetaminophen every 6 hourly as needed  CKD (chronic kidney disease) stage 4, GFR  15-29 ml/min (HCC) Creatinine 2.  At baseline.  Diabetes ([place]) Diet controlled. - HgbA1c - SSI - S  Chronic combined systolic and diastolic CHF (congestive heart failure) (HCC) Stable and compensated.  History of ischemic cardiomyopathy.  Last echo 2018, EF 20 to 25%. -Resume aspirin, Plavix, Lasix 40 mg daily -She does not think she is on amiodarone any more which was started for atrial tachycardia  CAD (coronary artery disease) Stable. -Resume aspirin, Plavix.  Essential hypertension Stable. -Resume losartan - She does not know what medications she takes, unknown if she is still taking metoprolol   DVT prophylaxis: Heparin Code Status:  Full code-confirmed with patient at bedside Family Communication:  None at bedside Disposition Plan: ~ 2 days Consults called: None Admission status:  Inpt Med surg I certify that at the point of admission it is my clinical judgment that the patient will require inpatient hospital care spanning beyond 2 midnights from the point of admission due to high intensity of service, high risk for further deterioration and high frequency of surveillance required.    Author: Bethena Roys, MD 02/03/2023 6:01 PM  For on call review [email or web address].

## 2023-02-04 ENCOUNTER — Inpatient Hospital Stay (HOSPITAL_COMMUNITY): Payer: Medicare Other

## 2023-02-04 LAB — CBC
HCT: 34.9 % — ABNORMAL LOW (ref 36.0–46.0)
Hemoglobin: 11 g/dL — ABNORMAL LOW (ref 12.0–15.0)
MCH: 28.4 pg (ref 26.0–34.0)
MCHC: 31.5 g/dL (ref 30.0–36.0)
MCV: 89.9 fL (ref 80.0–100.0)
Platelets: 162 10*3/uL (ref 150–400)
RBC: 3.88 MIL/uL (ref 3.87–5.11)
RDW: 14 % (ref 11.5–15.5)
WBC: 4.8 10*3/uL (ref 4.0–10.5)
nRBC: 0 % (ref 0.0–0.2)

## 2023-02-04 LAB — HEMOGLOBIN A1C
Hgb A1c MFr Bld: 7.9 % — ABNORMAL HIGH (ref 4.8–5.6)
Mean Plasma Glucose: 180 mg/dL

## 2023-02-04 LAB — GLUCOSE, CAPILLARY
Glucose-Capillary: 145 mg/dL — ABNORMAL HIGH (ref 70–99)
Glucose-Capillary: 175 mg/dL — ABNORMAL HIGH (ref 70–99)
Glucose-Capillary: 223 mg/dL — ABNORMAL HIGH (ref 70–99)
Glucose-Capillary: 174 mg/dL — ABNORMAL HIGH (ref 70–99)

## 2023-02-04 LAB — BASIC METABOLIC PANEL
Anion gap: 10 (ref 5–15)
BUN: 32 mg/dL — ABNORMAL HIGH (ref 8–23)
CO2: 24 mmol/L (ref 22–32)
Calcium: 8.5 mg/dL — ABNORMAL LOW (ref 8.9–10.3)
Chloride: 104 mmol/L (ref 98–111)
Creatinine, Ser: 1.84 mg/dL — ABNORMAL HIGH (ref 0.44–1.00)
GFR, Estimated: 29 mL/min — ABNORMAL LOW (ref >60)
Glucose, Bld: 105 mg/dL — ABNORMAL HIGH (ref 70–99)
Potassium: 3.7 mmol/L (ref 3.5–5.1)
Sodium: 138 mmol/L (ref 135–145)

## 2023-02-04 LAB — C-REACTIVE PROTEIN: CRP: 1 mg/dL — ABNORMAL HIGH (ref <1.0)

## 2023-02-04 LAB — SEDIMENTATION RATE: Sed Rate: 51 mm/hr — ABNORMAL HIGH (ref 0–22)

## 2023-02-04 MED ORDER — VANCOMYCIN HCL 1250 MG/250ML IV SOLN
1250.0000 mg | INTRAVENOUS | Status: DC
  Administered 2023-02-04: 1250 mg via INTRAVENOUS
  Filled 2023-02-04: qty 250

## 2023-02-04 MED ORDER — HYDROCERIN EX CREA
TOPICAL_CREAM | Freq: Two times a day (BID) | CUTANEOUS | Status: DC
  Filled 2023-02-04 (×2): qty 113

## 2023-02-04 MED ORDER — HYDROXYZINE HCL 25 MG PO TABS
25.0000 mg | ORAL_TABLET | Freq: Three times a day (TID) | ORAL | Status: DC | PRN
  Administered 2023-02-04 – 2023-02-05 (×2): 25 mg via ORAL
  Filled 2023-02-04 (×2): qty 1

## 2023-02-04 NOTE — TOC Initial Note (Signed)
Transition of Care [facility]) - Initial/Assessment Note    Patient Details  Name: Kristen Garner MRN: [id number] Date of Birth: 1951-10-01  [facility] CM/SW Contact:    Iona Beard, [place] Phone Number: 02/04/2023, 3:54 PM  Clinical Narrative:                 Pt is high risk for readmission. CSW spoke with pt to complete assessment. Pt lives with her sister. Pt is independent in completing ADLs. Pts sister provides transportation as needed. Pt has not had HH recently only years ago when she lived in [place]. Pt has a walker to use in the home. TOC to follow.   Expected Discharge Plan: Home/Self Care Barriers to Discharge: Continued Medical Work up   Patient Goals and CMS Choice Patient states their goals for this hospitalization and ongoing recovery are:: return home CMS Medicare.gov Compare Post Acute Care list provided to:: Patient Choice offered to / list presented to : Patient      Expected Discharge Plan and Services In-house Referral: Clinical Social Work Discharge Planning Services: CM Consult   Living arrangements for the past 2 months: Single Family Home                                      Prior Living Arrangements/Services Living arrangements for the past 2 months: Single Family Home Lives with:: Siblings Patient language and need for interpreter reviewed:: Yes Do you feel safe going back to the place where you live?: Yes      Need for Family Participation in Patient Care: Yes (Comment) Care giver support system in place?: Yes (comment) Current home services: DME Criminal Activity/Legal Involvement Pertinent to Current Situation/Hospitalization: No - Comment as needed  Activities of Daily Living Home Assistive Devices/Equipment: Walker (specify type), Bedside commode/3-in-1 ADL Screening (condition at time of admission) Patient's cognitive ability adequate to safely complete daily activities?: Yes Is the patient deaf or have  difficulty hearing?: No Does the patient have difficulty seeing, even when wearing glasses/contacts?: No Does the patient have difficulty concentrating, remembering, or making decisions?: No Patient able to express need for assistance with ADLs?: Yes Does the patient have difficulty dressing or bathing?: No Independently performs ADLs?: Yes (appropriate for developmental age) Does the patient have difficulty walking or climbing stairs?: No Weakness of Legs: None Weakness of Arms/Hands: None  Permission Sought/Granted                  Emotional Assessment Appearance:: Appears stated age Attitude/Demeanor/Rapport: Engaged Affect (typically observed): Accepting Orientation: : Oriented to Self, Oriented to Place, Oriented to  Time, Oriented to Situation Alcohol / Substance Use: Not Applicable Psych Involvement: No (comment)  Admission diagnosis:  Cellulitis of right foot [L03.115] Cellulitis of right lower extremity [L03.115] Diabetic foot infection ([place]) [id number], L08.9] Heel ulcer, right, with unspecified severity ([place]) [L97.419] Stage 4 chronic kidney disease ([place]) [N18.4] Patient Active Problem List   Diagnosis Date Noted   Cellulitis of right lower extremity 02/03/2023   Diabetes ([place]) 02/03/2023   Stage 4 chronic kidney disease ([place]) 02/03/2023   Diabetic foot infection ([place]) 02/03/2023   Heel ulcer, right, with unspecified severity ([place]) 02/03/2023   Chronic appendicitis    Abdominal pain, epigastric    Acute cecitis 10/28/2017   Chronic combined systolic and diastolic CHF (congestive heart failure) ([place]) 10/28/2017   Acute renal failure superimposed on stage 3  chronic kidney disease ([place]) 10/28/2017   Colitis 10/26/2017   AKI (acute kidney injury) ([place]) 10/26/2017   CAD (coronary artery disease) 10/26/2017   Tachycardia 09/28/2017   Dyspnea on exertion 09/25/2017   Atherosclerotic heart disease of native coronary artery with other forms of angina pectoris ([place])  10/14/2016   Atrial tachycardia 10/14/2016   Unstable angina (HCC)    Cardiomyopathy, ischemic    Elevated troponin    Acute on chronic combined systolic and diastolic CHF (congestive heart failure) (HCC)     Class: Acute   SOB (shortness of breath) 10/10/2016   Unifocal PVCs 10/10/2016   GERD (gastroesophageal reflux disease) 10/10/2016   Troponin level elevated 10/10/2016   Osteopenia 01/04/2015   Noncompliance 01/04/2015   UTI (urinary tract infection) 04/05/2013   Acute renal failure ([place]) 04/04/2013   Diarrhea 04/04/2013   Dehydration 04/04/2013   Hypokalemia 04/04/2013   Hyponatremia 99991111   Metabolic acidosis 99991111   Leucocytosis 04/04/2013   Hyperglycemia due to type 2 diabetes mellitus ([place]) 04/04/2013   Essential hypertension 04/04/2013   Infiltrating ductal carcinoma of breast ([place]) 10/05/2012   HEARTBURN 08/23/2010   ANXIETY DEPRESSION 07/15/2010   H/O atrial tachycardia 07/15/2010   PCP:  Sharilyn Sites, MD Pharmacy:   [facility] [place], [place] - [address] AT [place] [id number] [place] [address] Phone: [phone] Fax: [phone]     Social Determinants of Health (SDOH) Social History: SDOH Screenings   Tobacco Use: Medium Risk (02/03/2023)   SDOH Interventions:     Readmission Risk Interventions    02/04/2023    3:48 PM  Readmission Risk Prevention Plan  Transportation Screening Complete  HRI or Home Care Consult Complete  Social Work Consult for [place] Planning/Counseling Complete  Palliative Care Screening Not Applicable  Medication Review Press photographer) Complete

## 2023-02-04 NOTE — Progress Notes (Signed)
[facility] Progress Note Patient: KING TIER [id number] DOB: 07/16/1951 DOA: 02/03/2023  DOS: the patient was seen and examined on 02/04/2023  Brief hospital course: 72 year old female with PMH of chronic combined CHF, CAD, cardiomyopathy, HTN, type II DM, CKD 4 presented to hospital with complaints of nonhealing wound on her right foot. Patient reports that she was hospitalized and was sent to rehab where she was identified to have wound on her right heel and since then it has not healed.  As the wound was hurting she went to ER and was prescribed oral Keflex and doxycycline on 3/8.  She went to see her PCP who recommended her to come to the ER for admission for IV antibiotics on 3/12. Assessment and Plan: Nonhealing right heel ulcer with eschar. Most likely pressure injury resulting in deep tissue injury now. Has some serosanguineous discharge but no purulent discharge prior to coming to the hospital. Has been hurting for the last 6 months and just now patient has seek attention. On examination the heel on right appears to be boggy compared to the left with generalized swelling involving the right foot.  Patient intermittently using steroid cream for dry skin. X-ray foot unremarkable for any osteomyelitis. Unable to tolerate MRI as patient does not know her pacemaker Brand. Will perform CT without contrast (patient cannot handle contrast due to her CKD). Currently we will continue with IV vancomycin and ceftriaxone. I have ordered ESR CRP and blood cultures which were not ordered at the time of admission. Given her history of PVD as well as diminished pulses bilaterally I will also order ABI for further workup. Patient reports that she has been offloading her leg.  Will get PT evaluation and provide postop boot.  CKD 4. Serum creatinine close to baseline. Monitor for now. Renally dose medications.  Type 2 diabetes mellitus, uncontrolled with hyperglycemia diet controlled  without any long-term insulin use. Hemoglobin A1c pending. Currently on sliding scale insulin.  Chronic combined systolic and diastolic CHF. History of CAD HLD Euvolemic right now. Last EF 20 to 25% in 2018. Continue aspirin, Plavix, statin. Continue Lasix for now..  HTN. On losartan.  Will continue. Monitor for now.  Obesity Body mass index is 30.99 kg/m.  Placing the pt at higher risk of poor outcomes.  Subjective: Still has some pain.  No nausea or vomiting no fever no chills.  Reports some anxiety and itching.  Physical Exam: General: in Mild distress, No Rash Cardiovascular: S1 and S2 Present, No Murmur Respiratory: Good respiratory effort, Bilateral Air entry present. No Crackles, No wheezes Abdomen: Bowel Sound present, No tenderness Extremities: No edema Neuro: Alert and oriented x3, no new focal deficit  Data Reviewed: I have Reviewed nursing notes, Vitals, and Lab results. Since last encounter, pertinent lab results CBC and BMP   . I have ordered test including CBC BMP blood cultures ESR CRP  . I have ordered imaging ABI and MRI, MRI cannot perform, CT  .   Disposition: Status is: Inpatient Remains inpatient appropriate because: Requires IV antibiotics and further workup  heparin injection 5,000 Units Start: 02/03/23 2200   Family Communication: No one at bedside Level of care: Med-Surg  Vitals:   02/03/23 2121 02/04/23 0118 02/04/23 0552 02/04/23 1321  BP: (!) 157/61 133/74 139/62 (!) 144/85  Pulse: 68 66 71 80  Resp: '14 14 14 18  '$ Temp: 97.9 F (36.6 C) 97.7 F (36.5 C) 98.1 F (36.7 C) 98.9 F (37.2 C)  TempSrc: Oral Oral Oral  Oral  SpO2: 99% 97% 98% 100%  Weight:      Height:         Author: Berle Mull, MD 02/04/2023 5:18 PM  Please look on [email or web address] to find out who is on call.

## 2023-02-04 NOTE — Consult Note (Addendum)
WOC Nurse Consult Note: Reason for Consult:right posterior heel with eschar, new drainage and pain. Present for >6 months. Wound type: pressure Pressure Injury POA: Yes Measurement: 2cm x 3cm with depth obscured by the presence of eschar Wound bed:As noted above and also per photodocumentation provided by EDP Drainage (amount, consistency, odor) scant serosanguinous Periwound:dry and intact Dressing procedure/placement/frequency:I have provided guidance for the topical care of this lesions using a povidone-iodine application for its astringent and antimicrobial properties after a soap and water cleanse, rinse and dry. The dry povidone-iodine is to be topped with dry gauze (not a silicone foam) and secured with a few turns of Kerlix roll gauze.  Recommend consultation with podiatric medicine or surgery for unroofing (debridement) of this eschar. If you agree, please order/arrange consult.  [place] nursing team will not follow, but will remain available to this patient, the nursing and medical teams.  Please re-consult if needed.  Thank you for inviting [place] to participate in this patient's Plan of Care.  Maudie Flakes, MSN, RN, CNS, [place], Serita Grammes, [employer], [employer] phone:  [phone]

## 2023-02-05 LAB — CBC WITH DIFFERENTIAL/PLATELET
Abs Immature Granulocytes: 0.01 10*3/uL (ref 0.00–0.07)
Basophils Absolute: 0 10*3/uL (ref 0.0–0.1)
Basophils Relative: 1 %
Eosinophils Absolute: 0.2 10*3/uL (ref 0.0–0.5)
Eosinophils Relative: 6 %
HCT: 31.5 % — ABNORMAL LOW (ref 36.0–46.0)
Hemoglobin: 9.9 g/dL — ABNORMAL LOW (ref 12.0–15.0)
Immature Granulocytes: 0 %
Lymphocytes Relative: 18 %
Lymphs Abs: 0.8 10*3/uL (ref 0.7–4.0)
MCH: 28.1 pg (ref 26.0–34.0)
MCHC: 31.4 g/dL (ref 30.0–36.0)
MCV: 89.5 fL (ref 80.0–100.0)
Monocytes Absolute: 0.3 10*3/uL (ref 0.1–1.0)
Monocytes Relative: 8 %
Neutro Abs: 2.9 10*3/uL (ref 1.7–7.7)
Neutrophils Relative %: 67 %
Platelets: 155 10*3/uL (ref 150–400)
RBC: 3.52 MIL/uL — ABNORMAL LOW (ref 3.87–5.11)
RDW: 14.1 % (ref 11.5–15.5)
WBC: 4.3 10*3/uL (ref 4.0–10.5)
nRBC: 0 % (ref 0.0–0.2)

## 2023-02-05 LAB — COMPREHENSIVE METABOLIC PANEL
ALT: 14 U/L (ref 0–44)
AST: 26 U/L (ref 15–41)
Albumin: 3 g/dL — ABNORMAL LOW (ref 3.5–5.0)
Alkaline Phosphatase: 84 U/L (ref 38–126)
Anion gap: 11 (ref 5–15)
BUN: 32 mg/dL — ABNORMAL HIGH (ref 8–23)
CO2: 23 mmol/L (ref 22–32)
Calcium: 8.2 mg/dL — ABNORMAL LOW (ref 8.9–10.3)
Chloride: 102 mmol/L (ref 98–111)
Creatinine, Ser: 1.94 mg/dL — ABNORMAL HIGH (ref 0.44–1.00)
GFR, Estimated: 27 mL/min — ABNORMAL LOW (ref >60)
Glucose, Bld: 119 mg/dL — ABNORMAL HIGH (ref 70–99)
Potassium: 3.9 mmol/L (ref 3.5–5.1)
Sodium: 136 mmol/L (ref 135–145)
Total Bilirubin: 0.4 mg/dL (ref 0.3–1.2)
Total Protein: 6.1 g/dL — ABNORMAL LOW (ref 6.5–8.1)

## 2023-02-05 LAB — MAGNESIUM: Magnesium: 1.9 mg/dL (ref 1.7–2.4)

## 2023-02-05 LAB — GLUCOSE, CAPILLARY
Glucose-Capillary: 136 mg/dL — ABNORMAL HIGH (ref 70–99)
Glucose-Capillary: 157 mg/dL — ABNORMAL HIGH (ref 70–99)
Glucose-Capillary: 153 mg/dL — ABNORMAL HIGH (ref 70–99)
Glucose-Capillary: 214 mg/dL — ABNORMAL HIGH (ref 70–99)

## 2023-02-05 MED ORDER — GUAIFENESIN ER 600 MG PO TB12
600.0000 mg | ORAL_TABLET | Freq: Two times a day (BID) | ORAL | Status: DC
  Administered 2023-02-05 – 2023-02-06 (×3): 600 mg via ORAL
  Filled 2023-02-05 (×3): qty 1

## 2023-02-05 MED ORDER — FLUTICASONE PROPIONATE 50 MCG/ACT NA SUSP: 1.0000 | Freq: Every day | NASAL | Status: DC | PRN

## 2023-02-05 MED ORDER — COLLAGENASE 250 UNIT/GM EX OINT
TOPICAL_OINTMENT | Freq: Every day | CUTANEOUS | Status: DC
  Filled 2023-02-05: qty 30

## 2023-02-05 NOTE — Progress Notes (Signed)
[facility] PROGRESS NOTE  Kristen Garner (DOB: 01/16/1951) XK:[id number] PCP: Sharilyn Sites, MD  Brief Narrative: 72 year old female with PMH of chronic combined CHF, CAD, cardiomyopathy, HTN, type II DM, CKD 4 presented to hospital with complaints of nonhealing wound on her right foot. Patient reports that she was hospitalized and was sent to rehab where she was identified to have wound on her right heel and since then it has not healed.  As the wound was hurting she went to ER and was prescribed oral Keflex and doxycycline on 3/8.  She went to see her PCP who recommended her to come to the ER for admission for IV antibiotics on 3/12.  Subjective: Feels overall improving. Much less red and swollen and tender than at admission. No fever. Primary issue currently is chest congestion. No dyspnea.  Objective: BP (!) 115/43   Pulse 70   Temp 98 F (36.7 C) (Oral)   Resp 18   Ht '5\' 6"'$  (1.676 m)   Wt 87.1 kg   SpO2 96%   BMI 30.99 kg/m   Gen: Older female in no acute distress Pulm: Clear, nonlabored.  CV: RRR, no MRG.  GI: Soft, NT, ND, +BS  Neuro: Alert and oriented. No new focal deficits. Ext: Warm, no deformities. Skin: Right heel with dried wound with dark eschar, no exudate or odor. Tender erythema indistinctly surrounding without fluctuance. No other rashes, lesions or ulcers on visualized skin   Assessment & Plan: Principal Problem:   Cellulitis of right lower extremity Active Problems:   H/O atrial tachycardia   Essential hypertension   Cardiomyopathy, ischemic   CAD (coronary artery disease)   Chronic combined systolic and diastolic CHF (congestive heart failure) (HCC)   Diabetes (HCC)   Stage 4 chronic kidney disease (HCC)   Diabetic foot infection ([place])   Heel ulcer, right, with unspecified severity ([place])  Nonhealing right heel ulcer with eschar: > 6 months ongoing.  - CT not suggestive of osteomyelitis or abscess. WBC wnl, CRP 1.0. We will monitor closely  on IV abx and deescalate to oral antibiotics once sustaining improvement.  - Surgery consulted for evaluation Re: debridement. Would benefit from podiatry evaluation after discharge (no consulting service available) - ABIs 0.66 (R) and 0.77 (L), monophasic. This should be sufficient to heal wound, though will refer to vascular surgery as outpatient. stage IV CKD puts at very high risk with angiogram, would need CO2 - Currently we will continue with IV vancomycin and ceftriaxone. I have ordered ESR CRP and blood cultures which were not ordered at the time of admission. - Offload the area as much as possible.   Stage 4 CKD: Near baseline.  - Renally dose medications.    T2DM: Uncontrolled with hyperglycemia with HbA1c 7.9%.  - Continue SSI, needs PCP follow up for ongoing management. CrCl prohibits metformin.    Chronic combined systolic and diastolic CHF. History of CAD, hx PPM HLD Euvolemic right now. Last EF 20 to 25% in 2018. - Continue aspirin, plavix, statin, lasix.    HTN. On losartan.  Will continue. Monitor for now.   URI: Congestion reported.  - Abx as above - Mucinex scheduled for mucolytic  Obesity: Body mass index is 30.99 kg/m.  Placing the pt at higher risk of poor outcomes.  Patrecia Pour, MD [facility] [email or web address] 02/05/2023, 4:22 PM

## 2023-02-05 NOTE — Procedures (Signed)
Bedside procedure note   Preoperative Diagnosis: Unstageable right heel ulcer Postoperative Diagnosis: Stage I! Right heel ulcer   Procedure(s) Performed: Hervey Ard excisional debridement of right heel ulcer   Performing provider: Barnetta Chapel Glendell Schlottman, DO   Estimated Blood Loss: Minimal   Findings: Stage II right heel ulcer, no evidence of subcutaneous infection on debridement   Procedure: Verbal consent obtained from the patient prior to beginning of procedure.  Timeout was performed.  Using scalpel and scissors, the eschar/epidermis overlying the right heel ulcer was sharply debrided.  Sharp excisional debridement was performed down to the dermis, at which point bleeding was noted.  Extensive debridement was not performed, as patient is currently on Eliquis.  The final measurements of the wound were 3 cm x 2 cm.  Patient tolerated the procedure without issue.   Picture of postdebridement ulcer is in the media tab (patient consented to inclusion of this photo to her medical record).    Graciella Freer, DO [facility] [address] [place], [place] [address] [phone] (office)

## 2023-02-05 NOTE — Consult Note (Signed)
[place]  Reason for Consult: Right heel wound Referring Physician: Dr. Bonner Puna  Chief Complaint   Wound Infection     HPI: Kristen Garner is a 72 y.o. female who was admitted with right lower extremity cellulitis thought to be secondary to right heel wound.  She states that the wound has been present for almost a year.  She has not received any treatment specifically for her wound, but it has failed to improve.  It has remained stable in size.  She was admitted secondary to concern for cellulitis with surrounding erythema.  She had no leukocytosis upon admission.  She initially underwent foot x-ray which demonstrated stable irregularities in the heel soft tissues without evidence suggestive of osteomyelitis.  She subsequently underwent a CT of her foot which demonstrated diffuse soft tissue swelling consistent with cellulitis and no evidence of osteomyelitis.  She also underwent arterial ultrasound of her bilateral lower extremities with a right ABI of 0.66 and left ABI of 0.77.  She otherwise has no complaints at this time.  Denies fevers and chills.  She has a past medical history significant for coronary artery disease status post cardiac catheterization and currently on Eliquis, hypertension, hyperlipidemia, and diabetes.  Her surgical history is significant for abdominal hysterectomy, breast surgery, and cholecystectomy.  Past Medical History:  Diagnosis Date   Abnormal chest CT    a. CTA 09/2016: ""1.4 cm aortopulmonary window node seen, of uncertain significance"   Anxiety    Arthritis    Breast cancer ([place])    right   Breast cancer (HCC)    CAD (coronary artery disease)    a. 09/2016: cath showing 3-v disease with severe stenosis along RCA, CTO of PL branch, mild LAD stenosis, and moderate D1 stenosis. S/p DES x2 to RCA on 10/14/2016.   Chronic systolic CHF (congestive heart failure) ([place])    a. 09/2016: Echo showing EF of 20-25% with diffuse HK    CKD (chronic kidney disease), stage III ([place])    Depression    Diabetes mellitus    takes only the pill   Headache(784.0)    occiasional migraine   Hyperlipidemia    Hypertension    Hypokalemia    Noncompliance 01/04/2015   Osteopenia 01/04/2015   PAT (paroxysmal atrial tachycardia)    PVC's (premature ventricular contractions)    Shingles    Skin cancer 2015   right leg    Past Surgical History:  Procedure Laterality Date   ABDOMINAL HYSTERECTOMY     BREAST BIOPSY Right    biopsy then wide excision    CARDIAC CATHETERIZATION N/A 10/13/2016   Procedure: Left Heart Cath and Coronary Angiography;  Surgeon: Burnell Blanks, MD;  Location: [place] [facility];  Service: Cardiovascular;  Laterality: N/A;   CARDIAC CATHETERIZATION N/A 10/14/2016   Procedure: Coronary Stent Intervention;  Surgeon: Burnell Blanks, MD;  Location: [place] [facility];  Service: Cardiovascular;  Laterality: N/A;   CHOLECYSTECTOMY     CORONARY ANGIOPLASTY WITH STENT PLACEMENT  10/14/2016   ESOPHAGOGASTRODUODENOSCOPY N/A 10/31/2017   Procedure: ESOPHAGOGASTRODUODENOSCOPY (EGD);  Surgeon: Daneil Dolin, MD;  [facility];  Service: Endoscopy;  Laterality: N/A;   PARTIAL MASTECTOMY WITH AXILLARY SENTINEL LYMPH NODE BIOPSY     SENTINEL LYMPH NODE BIOPSY     SKIN CANCER EXCISION Right 2015    Family History  Problem Relation Age of Onset   Diabetes Father    Stroke Maternal Grandmother     Social  History   Tobacco Use   Smoking status: Former   Smokeless tobacco: Never  Vaping Use   Vaping Use: Never used  Substance Use Topics   Alcohol use: No   Drug use: No    Medications: I have reviewed the patient's current medications. Prior to Admission:  Medications Prior to Admission  Medication Sig Dispense Refill Last Dose   albuterol (PROVENTIL HFA;VENTOLIN HFA) 108 (90 Base) MCG/ACT inhaler Inhale 2 puffs into the lungs every 4 (four) hours as needed for wheezing or  shortness of breath. 1 Inhaler 2 Past Week   aspirin EC 81 MG EC tablet Take 1 tablet (81 mg total) by mouth daily.   02/03/2023   calcium carbonate (ANTACID) 420 MG CHEW chewable tablet Chew 420 mg daily as needed by mouth for indigestion or heartburn.   Past Week   cephALEXin (KEFLEX) 500 MG capsule Take 1 capsule (500 mg total) by mouth 4 (four) times daily for 10 days. 40 capsule 0 02/03/2023   clopidogrel (PLAVIX) 75 MG tablet Take 1 tablet (75 mg total) daily by mouth. 30 tablet 0 02/03/2023 at 0800   diclofenac Sodium (VOLTAREN) 1 % GEL Apply topically.   unknown   doxycycline (VIBRAMYCIN) 100 MG capsule Take 1 capsule (100 mg total) by mouth 2 (two) times daily. 20 capsule 0 02/03/2023   ferrous sulfate 325 (65 FE) MG EC tablet Take by mouth.   02/03/2023   furosemide (LASIX) 40 MG tablet Take 1 tablet (40 mg total) daily by mouth. 30 tablet 11 02/03/2023   gabapentin (NEURONTIN) 300 MG capsule Take 300 mg by mouth 3 (three) times daily.   02/03/2023   levothyroxine (SYNTHROID) 75 MCG tablet Take 75 mcg by mouth daily.   02/03/2023   losartan (COZAAR) 25 MG tablet Take 25 mg by mouth daily.   02/03/2023   nitroGLYCERIN (NITROSTAT) 0.4 MG SL tablet Place 0.4 mg under the tongue every 5 (five) minutes as needed for chest pain.   unknown   senna (SENOKOT) 8.6 MG tablet Take by mouth.      ALPRAZolam (XANAX) 1 MG tablet Take 1 mg by mouth 3 (three) times daily. (Patient not taking: Reported on 02/03/2023)   Not Taking   amiodarone (PACERONE) 200 MG tablet Take 1 tablet (200 mg total) 2 (two) times daily by mouth. (Patient not taking: Reported on 01/15/2019) 60 tablet 0 Not Taking   escitalopram (LEXAPRO) 10 MG tablet Take 10 mg daily by mouth. (Patient not taking: Reported on 02/03/2023)  1 Not Taking   HYDROcodone-acetaminophen (NORCO/VICODIN) 5-325 MG tablet Take one tab po q 6 hrs prn pain (Patient not taking: Reported on 02/03/2023) 12 tablet 0 Not Taking   levothyroxine (SYNTHROID, LEVOTHROID) 50 MCG  tablet Take 1 tablet by mouth daily. (Patient not taking: Reported on 02/03/2023)   Not Taking   metoprolol succinate (TOPROL-XL) 100 MG 24 hr tablet Take 1 tablet (100 mg total) 2 (two) times daily by mouth. Take with or immediately following a meal. (Patient not taking: Reported on 01/15/2019) 60 tablet 0 Not Taking   metoprolol succinate (TOPROL-XL) 50 MG 24 hr tablet Take 1 tablet by mouth 2 (two) times daily. (Patient not taking: Reported on 02/03/2023)   Not Taking   metoprolol tartrate (LOPRESSOR) 50 MG tablet Take 50 mg by mouth 2 (two) times daily.      pantoprazole (PROTONIX) 40 MG tablet Take 1 tablet (40 mg total) by mouth daily. 30 tablet 1    Potassium Chloride ER  20 MEQ TBCR Take 20 mEq by mouth daily. 30 tablet 11    rOPINIRole (REQUIP) 1 MG tablet Take 1 mg by mouth 3 (three) times daily.   1    rosuvastatin (CRESTOR) 5 MG tablet Take 1 tablet (5 mg total) daily by mouth. 90 tablet 3    sertraline (ZOLOFT) 25 MG tablet Take 1 tablet by mouth daily.      Vitamin D, Ergocalciferol, (DRISDOL) 1.25 MG (50000 UT) CAPS capsule Take 1 capsule by mouth once a week. saturdays      zolpidem (AMBIEN) 10 MG tablet Take 10 mg by mouth at bedtime.      Scheduled:  aspirin EC  81 mg Oral Daily   clopidogrel  75 mg Oral Daily   furosemide  40 mg Oral Daily   gabapentin  300 mg Oral TID   guaiFENesin  600 mg Oral BID   heparin  5,000 Units Subcutaneous Q8H   hydrocerin   Topical BID   insulin aspart  0-5 Units Subcutaneous QHS   insulin aspart  0-9 Units Subcutaneous TID WC   levothyroxine  75 mcg Oral Daily   losartan  25 mg Oral Daily   nystatin   Topical BID   rosuvastatin  5 mg Oral Daily   Continuous:  cefTRIAXone (ROCEPHIN)  IV 2 g (02/05/23 1645)   vancomycin 1,250 mg (02/04/23 2238)   [id number] **OR** acetaminophen, fluticasone, HYDROcodone-acetaminophen, hydrOXYzine, polyethylene glycol  Allergies  Allergen Reactions   Adhesive [Tape] Rash   Codeine Itching    Penicillins Other (See Comments)    Child hood allergy Has patient had a PCN reaction causing immediate rash, facial/tongue/throat swelling, SOB or lightheadedness with hypotension: Unknown Has patient had a PCN reaction causing severe rash involving mucus membranes or skin necrosis: Unknown Has patient had a PCN reaction that required hospitalization: No Has patient had a PCN reaction occurring within the last 10 years: No If all of the above answers are "NO", then may proceed with Cephalosporin use.    Sulfonamide Derivatives Itching   Iodine     Patient states that she got a cat scratch and her mom put iodine on it and then got catch scratch fever     ROS:  Pertinent items are noted in HPI.  Blood pressure (!) 115/43, pulse 70, temperature 98 F (36.7 C), temperature source Oral, resp. rate 18, height '5\' 6"'$  (1.676 m), weight 87.1 kg, SpO2 96 %. Physical Exam Vitals reviewed.  Constitutional:      Appearance: Normal appearance.  HENT:     Head: Normocephalic and atraumatic.  Eyes:     Extraocular Movements: Extraocular movements intact.     Pupils: Pupils are equal, round, and reactive to light.  Cardiovascular:     Rate and Rhythm: Normal rate.     Pulses:          Dorsalis pedis pulses are 1+ on the right side and 1+ on the left side.       Posterior tibial pulses are 1+ on the right side and 1+ on the left side.  Pulmonary:     Effort: Pulmonary effort is normal.  Abdominal:     General: There is no distension.     Palpations: Abdomen is soft.     Tenderness: There is no abdominal tenderness.  Musculoskeletal:        General: Normal range of motion.  Skin:    General: Skin is warm and dry.     Comments: Right heel  wound with overlying eschar/scab, tender to touch, 3 x 2 cm  Neurological:     General: No focal deficit present.     Mental Status: She is alert and oriented to person, place, and time.  Psychiatric:        Mood and Affect: Mood normal.         Behavior: Behavior normal.     Results: Results for orders placed or performed during the hospital encounter of 02/03/23 (from the past 48 hour(s))  Glucose, capillary     Status: Abnormal   Collection Time: 02/03/23  9:28 PM  Result Value Ref Range   Glucose-Capillary 269 (H) 70 - 99 mg/dL    Comment: Glucose reference range applies only to samples taken after fasting for at least 8 hours.  Basic metabolic panel     Status: Abnormal   Collection Time: 02/04/23  3:59 AM  Result Value Ref Range   Sodium 138 135 - 145 mmol/L   Potassium 3.7 3.5 - 5.1 mmol/L   Chloride 104 98 - 111 mmol/L   CO2 24 22 - 32 mmol/L   Glucose, Bld 105 (H) 70 - 99 mg/dL    Comment: Glucose reference range applies only to samples taken after fasting for at least 8 hours.   BUN 32 (H) 8 - 23 mg/dL   Creatinine, Ser 1.84 (H) 0.44 - 1.00 mg/dL   Calcium 8.5 (L) 8.9 - 10.3 mg/dL   GFR, Estimated 29 (L) >60 mL/min    Comment: (NOTE) Calculated using the CKD-EPI Creatinine Equation (2021)    Anion gap 10 5 - 15    Comment: Performed at [facility], [address], [place], [place] [address]  CBC     Status: Abnormal   Collection Time: 02/04/23  3:59 AM  Result Value Ref Range   WBC 4.8 4.0 - 10.5 K/uL   RBC 3.88 3.87 - 5.11 MIL/uL   Hemoglobin 11.0 (L) 12.0 - 15.0 g/dL   HCT 34.9 (L) 36.0 - 46.0 %   MCV 89.9 80.0 - 100.0 fL   MCH 28.4 26.0 - 34.0 pg   MCHC 31.5 30.0 - 36.0 g/dL   RDW 14.0 11.5 - 15.5 %   Platelets 162 150 - 400 K/uL   nRBC 0.0 0.0 - 0.2 %    Comment: Performed at [facility], [address] [place], [place] [address]  Glucose, capillary     Status: Abnormal   Collection Time: 02/04/23  8:18 AM  Result Value Ref Range   Glucose-Capillary 145 (H) 70 - 99 mg/dL    Comment: Glucose reference range applies only to samples taken after fasting for at least 8 hours.  Culture, blood (Routine X 2) w Reflex to ID Panel     Status: None (Preliminary result)   Collection Time: 02/04/23 12:06 PM    Specimen: BLOOD  Result Value Ref Range   Specimen Description BLOOD BLOOD LEFT HAND    Special Requests      BOTTLES DRAWN AEROBIC AND ANAEROBIC Blood Culture adequate volume   Culture      NO GROWTH < 24 HOURS Performed at [facility], [address], [place], [place] [address]    Report Status PENDING   Culture, blood (Routine X 2) w Reflex to ID Panel     Status: None (Preliminary result)   Collection Time: 02/04/23 12:06 PM   Specimen: BLOOD  Result Value Ref Range   Specimen Description BLOOD BLOOD LEFT HAND    Special  Requests      BOTTLES DRAWN AEROBIC AND ANAEROBIC Blood Culture adequate volume   Culture      NO GROWTH < 24 HOURS Performed at [facility], [address] [place], [place] [address]    Report Status PENDING   C-reactive protein     Status: Abnormal   Collection Time: 02/04/23 12:06 PM  Result Value Ref Range   CRP 1.0 (H) <1.0 mg/dL    Comment: Performed at [facility], [place] [address], [place], [place] [address]  Sedimentation rate     Status: Abnormal   Collection Time: 02/04/23 12:06 PM  Result Value Ref Range   Sed Rate 51 (H) 0 - 22 mm/hr    Comment: Performed at [facility], [address] [place], [place] [address]  Glucose, capillary     Status: Abnormal   Collection Time: 02/04/23 12:52 PM  Result Value Ref Range   Glucose-Capillary 175 (H) 70 - 99 mg/dL    Comment: Glucose reference range applies only to samples taken after fasting for at least 8 hours.  Glucose, capillary     Status: Abnormal   Collection Time: 02/04/23  4:18 PM  Result Value Ref Range   Glucose-Capillary 223 (H) 70 - 99 mg/dL    Comment: Glucose reference range applies only to samples taken after fasting for at least 8 hours.  Glucose, capillary     Status: Abnormal   Collection Time: 02/04/23  8:39 PM  Result Value Ref Range   Glucose-Capillary 174 (H) 70 - 99 mg/dL    Comment: Glucose reference range applies only to samples taken after fasting for at least  8 hours.  Comprehensive metabolic panel     Status: Abnormal   Collection Time: 02/05/23  3:57 AM  Result Value Ref Range   Sodium 136 135 - 145 mmol/L   Potassium 3.9 3.5 - 5.1 mmol/L   Chloride 102 98 - 111 mmol/L   CO2 23 22 - 32 mmol/L   Glucose, Bld 119 (H) 70 - 99 mg/dL    Comment: Glucose reference range applies only to samples taken after fasting for at least 8 hours.   BUN 32 (H) 8 - 23 mg/dL   Creatinine, Ser 1.94 (H) 0.44 - 1.00 mg/dL   Calcium 8.2 (L) 8.9 - 10.3 mg/dL   Total Protein 6.1 (L) 6.5 - 8.1 g/dL   Albumin 3.0 (L) 3.5 - 5.0 g/dL   AST 26 15 - 41 U/L   ALT 14 0 - 44 U/L   Alkaline Phosphatase 84 38 - 126 U/L   Total Bilirubin 0.4 0.3 - 1.2 mg/dL   GFR, Estimated 27 (L) >60 mL/min    Comment: (NOTE) Calculated using the CKD-EPI Creatinine Equation (2021)    Anion gap 11 5 - 15    Comment: Performed at [facility] [address] [place], [place] [address]  CBC with Differential/Platelet     Status: Abnormal   Collection Time: 02/05/23  3:57 AM  Result Value Ref Range   WBC 4.3 4.0 - 10.5 K/uL   RBC 3.52 (L) 3.87 - 5.11 MIL/uL   Hemoglobin 9.9 (L) 12.0 - 15.0 g/dL   HCT 31.5 (L) 36.0 - 46.0 %   MCV 89.5 80.0 - 100.0 fL   MCH 28.1 26.0 - 34.0 pg   MCHC 31.4 30.0 - 36.0 g/dL   RDW 14.1 11.5 - 15.5 %   Platelets 155 150 - 400 K/uL   nRBC 0.0 0.0 -  0.2 %   Neutrophils Relative % 67 %   Neutro Abs 2.9 1.7 - 7.7 K/uL   Lymphocytes Relative 18 %   Lymphs Abs 0.8 0.7 - 4.0 K/uL   Monocytes Relative 8 %   Monocytes Absolute 0.3 0.1 - 1.0 K/uL   Eosinophils Relative 6 %   Eosinophils Absolute 0.2 0.0 - 0.5 K/uL   Basophils Relative 1 %   Basophils Absolute 0.0 0.0 - 0.1 K/uL   Immature Granulocytes 0 %   Abs Immature Granulocytes 0.01 0.00 - 0.07 K/uL    Comment: Performed at [facility], [address] [place], [place] [address]  Magnesium     Status: None   Collection Time: 02/05/23  3:57 AM  Result Value Ref Range   Magnesium 1.9 1.7 - 2.4 mg/dL     Comment: Performed at [facility], [address], [place], [place] [address]  Glucose, capillary     Status: Abnormal   Collection Time: 02/05/23  7:48 AM  Result Value Ref Range   Glucose-Capillary 136 (H) 70 - 99 mg/dL    Comment: Glucose reference range applies only to samples taken after fasting for at least 8 hours.  Glucose, capillary     Status: Abnormal   Collection Time: 02/05/23 11:35 AM  Result Value Ref Range   Glucose-Capillary 157 (H) 70 - 99 mg/dL    Comment: Glucose reference range applies only to samples taken after fasting for at least 8 hours.    CT FOOT RIGHT WO CONTRAST  Result Date: 02/05/2023 CLINICAL DATA:  Right foot redness, pain and swelling for 8 months. EXAM: CT OF THE RIGHT FOOT WITHOUT CONTRAST TECHNIQUE: Multidetector CT imaging of the right foot was performed according to the standard protocol. Multiplanar CT image reconstructions were also generated. RADIATION DOSE REDUCTION: This exam was performed according to the departmental dose-optimization program which includes automated exposure control, adjustment of the mA and/or kV according to patient size and/or use of iterative reconstruction technique. COMPARISON:  None Available. FINDINGS: Bones/Joint/Cartilage Bones are osteopenic. No bony destructive change or periosteal reaction is identified. No fracture or dislocation. No joint effusion. Ligaments Suboptimally assessed by CT. Muscles and Tendons No intramuscular fluid collection identified. Muscles and tendons appear intact. Soft tissues Diffuse soft tissue swelling is seen. No radiopaque foreign body. No soft tissue gas. IMPRESSION: Diffuse soft tissue swelling consistent with dependent change or cellulitis. Negative for CT evidence of osteomyelitis, septic joint or abscess. Electronically Signed   By: Inge Rise M.D.   On: 02/05/2023 08:26   US ARTERIAL ABI (SCREENING LOWER EXTREMITY)  Result Date: 02/04/2023 CLINICAL DATA:  72 year old female with  history of peripheral vascular disease. EXAM: NONINVASIVE PHYSIOLOGIC VASCULAR STUDY OF BILATERAL LOWER EXTREMITIES TECHNIQUE: Evaluation of both lower extremities were performed at rest, including calculation of ankle-brachial indices with single level Doppler, pressure and pulse volume recording. COMPARISON:  None Available. FINDINGS: Right ABI:  0.66 Left ABI:  0.77 Right Lower Extremity:  Monophasic arterial waveforms at the ankle. Left Lower Extremity:  Monophasic arterial waveforms at the ankle. IMPRESSION: Moderately abnormal bilateral ankle-brachial indices, right worse than left. Electronically Signed   By: Ruthann Cancer M.D.   On: 02/04/2023 14:28     Assessment & Plan:  Kristen Garner is a 72 y.o. female who was admitted with right lower extremity cellulitis thought to be secondary to a right heel wound.  Imaging and blood work evaluated by myself  -Upon evaluation of the patient's wound, she has an  overlying scab/eschar which I will attempt to debride -Given her currently being on Eliquis, I will not perform extensive debridement so as not to cause significant blood loss to the patient -The risk and benefits of right heel debridement were discussed including but not limited to bleeding, infection, and need for additional procedures.  After careful consideration, LAKSHYA CHHOEUN has decided to proceed with bedside debridement.  -Please refer to separate dictation regarding debridement -Recommend daily dressing changes with Santyl, damp 4 x 4, dry gauze, and wrap with Kerlix -Will place orders for dressing changes -Patient will need to follow-up outpatient with either wound clinic or podiatry for further management of the wound -Patient stable for discharge from general surgery standpoint with this wound -Care per primary team  All questions were answered to the satisfaction of the patient.  -- Graciella Freer, DO [facility] [address] [place], [place] [address] [phone] (office)

## 2023-02-05 NOTE — Progress Notes (Signed)
Wound care completed on R heel. Pt slept through the night. SBP elevated, other vitals stable.

## 2023-02-06 ENCOUNTER — Encounter: Payer: Self-pay | Admitting: *Deleted

## 2023-02-06 LAB — GLUCOSE, CAPILLARY
Glucose-Capillary: 122 mg/dL — ABNORMAL HIGH (ref 70–99)
Glucose-Capillary: 173 mg/dL — ABNORMAL HIGH (ref 70–99)

## 2023-02-06 LAB — HEMOGLOBIN AND HEMATOCRIT, BLOOD
HCT: 32.4 % — ABNORMAL LOW (ref 36.0–46.0)
Hemoglobin: 10.1 g/dL — ABNORMAL LOW (ref 12.0–15.0)

## 2023-02-06 MED ORDER — COLLAGENASE 250 UNIT/GM EX OINT: TOPICAL_OINTMENT | Freq: Every day | CUTANEOUS | 0 refills | Status: DC

## 2023-02-06 MED ORDER — GUAIFENESIN ER 600 MG PO TB12: 600.0000 mg | ORAL_TABLET | Freq: Two times a day (BID) | ORAL | 0 refills | Status: DC | PRN

## 2023-02-06 MED ORDER — DOXYCYCLINE HYCLATE 100 MG PO CAPS: 100.0000 mg | ORAL_CAPSULE | Freq: Two times a day (BID) | ORAL | 0 refills | Status: AC

## 2023-02-06 MED ORDER — CEPHALEXIN 500 MG PO CAPS: 500.0000 mg | ORAL_CAPSULE | Freq: Two times a day (BID) | ORAL | 0 refills | Status: AC

## 2023-02-06 NOTE — Discharge Summary (Signed)
Physician Discharge Summary   Patient: Kristen Garner MRN: [id number] DOB: 02-21-1951  Admit date:     02/03/2023  Discharge date: 02/06/23  Discharge Physician: Patrecia Pour   PCP: Sharilyn Sites, MD   Recommendations at discharge:  Follow up with PCP in next 1-2 weeks for recheck and continued diabetes management. Referral to podiatry and/or surgery for continued wound care. Pt declined home health RN.   Discharge Diagnoses: Principal Problem:   Cellulitis of right lower extremity Active Problems:   H/O atrial tachycardia   Essential hypertension   Cardiomyopathy, ischemic   CAD (coronary artery disease)   Chronic combined systolic and diastolic CHF (congestive heart failure) (HCC)   Diabetes (HCC)   Stage 4 chronic kidney disease (HCC)   Diabetic foot infection ([place])   Heel ulcer, right, with unspecified severity [facility])   Hospital Course: 72 year old female with PMH of chronic combined CHF, CAD, cardiomyopathy, HTN, type II DM, CKD 4 presented to hospital with complaints of nonhealing wound on her right foot. Patient reports that she was hospitalized and was sent to rehab where she was identified to have wound on her right heel and since then it has not healed.  As the wound was hurting she went to ER and was prescribed oral Keflex and doxycycline on 3/8.  She went to see her PCP who recommended her to come to the ER for admission for IV antibiotics on 3/12. She has improved significantly since that time, remaining hemodynamically stable. Surgery performed bedside debridement 3/14. She will be discharged on oral antibiotics in stable condition.  Assessment and Plan: Nonhealing right heel ulcer with eschar: > 6 months ongoing.  - CT not suggestive of osteomyelitis or abscess. WBC wnl, CRP 1.0. Has shown sustained improvement on ceftriaxone and vancomycin. PCN allergy noted. Will deescalate to keflex and doxycycline which has been tolerated in the past. D/w pharmacy, given her  renal insufficiency, 500mg  BID keflex dosing is recommended.  - Surgery consulted for evaluation Re: debridement. Would benefit from podiatry evaluation after discharge (no consulting service available). Referral placed. - ABIs 0.66 (R) and 0.77 (L), monophasic. This should be sufficient to heal wound, though will refer to vascular surgery as outpatient. stage IV CKD puts at very high risk with angiogram, would need CO2 - Currently we will continue with IV vancomycin and ceftriaxone. I have ordered ESR CRP and blood cultures which were not ordered at the time of admission. - Offload the area as much as possible.    Stage 4 CKD: Near baseline.  - Renally dose medications.     T2DM: Uncontrolled with hyperglycemia with HbA1c 7.9%.  - Needs PCP follow up for ongoing management. CrCl prohibits metformin.    Chronic combined systolic and diastolic CHF. History of CAD, hx PPM HLD Euvolemic right now. Last EF 20 to 25% in 2018. - Continue aspirin, plavix, statin, lasix.    HTN. Continue home Tx.   URI: Congestion reported.  - Abx as above - Mucinex continued for mucolytic   Obesity: Body mass index is 30.99 kg/m.  Placing the pt at higher risk of poor outcomes.  Consultants: Surgery Procedures performed: Bedside debridement 3/14 Dr. Okey Dupre  Disposition: Home Diet recommendation:  Cardiac and Carb modified diet DISCHARGE MEDICATION: Allergies as of 02/06/2023       Reactions   Adhesive [tape] Rash   Codeine Itching   Penicillins Other (See Comments)   Child hood allergy Has patient had a PCN reaction causing immediate rash, facial/tongue/throat swelling,  SOB or lightheadedness with hypotension: Unknown Has patient had a PCN reaction causing severe rash involving mucus membranes or skin necrosis: Unknown Has patient had a PCN reaction that required hospitalization: No Has patient had a PCN reaction occurring within the last 10 years: No If all of the above answers are "NO",  then may proceed with Cephalosporin use.   Sulfonamide Derivatives Itching   Iodine    Patient states that she got a cat scratch and her mom put iodine on it and then got catch scratch fever        Medication List     STOP taking these medications    ALPRAZolam 1 MG tablet Commonly known as: XANAX   amiodarone 200 MG tablet Commonly known as: PACERONE   escitalopram 10 MG tablet Commonly known as: LEXAPRO   HYDROcodone-acetaminophen 5-325 MG tablet Commonly known as: NORCO/VICODIN   metoprolol succinate 100 MG 24 hr tablet Commonly known as: TOPROL-XL   metoprolol succinate 50 MG 24 hr tablet Commonly known as: TOPROL-XL   zolpidem 10 MG tablet Commonly known as: AMBIEN       TAKE these medications    albuterol 108 (90 Base) MCG/ACT inhaler Commonly known as: VENTOLIN HFA Inhale 2 puffs into the lungs every 4 (four) hours as needed for wheezing or shortness of breath.   Antacid 420 MG Chew chewable tablet Generic drug: calcium carbonate Chew 420 mg daily as needed by mouth for indigestion or heartburn.   aspirin EC 81 MG tablet Take 1 tablet (81 mg total) by mouth daily.   cephALEXin 500 MG capsule Commonly known as: KEFLEX Take 1 capsule (500 mg total) by mouth 2 (two) times daily for 7 days. What changed: when to take this   clopidogrel 75 MG tablet Commonly known as: PLAVIX Take 1 tablet (75 mg total) daily by mouth.   collagenase 250 UNIT/GM ointment Commonly known as: SANTYL Apply topically daily. Start taking on: February 07, 2023   diclofenac Sodium 1 % Gel Commonly known as: VOLTAREN Apply topically.   doxycycline 100 MG capsule Commonly known as: VIBRAMYCIN Take 1 capsule (100 mg total) by mouth 2 (two) times daily for 7 days.   ferrous sulfate 325 (65 FE) MG EC tablet Take by mouth.   furosemide 40 MG tablet Commonly known as: LASIX Take 1 tablet (40 mg total) daily by mouth.   gabapentin 300 MG capsule Commonly known as:  NEURONTIN Take 300 mg by mouth 3 (three) times daily.   guaiFENesin 600 MG 12 hr tablet Commonly known as: MUCINEX Take 1 tablet (600 mg total) by mouth 2 (two) times daily as needed for cough or to loosen phlegm.   levothyroxine 75 MCG tablet Commonly known as: SYNTHROID Take 75 mcg by mouth daily. What changed: Another medication with the same name was removed. Continue taking this medication, and follow the directions you see here.   losartan 25 MG tablet Commonly known as: COZAAR Take 25 mg by mouth daily.   metoprolol tartrate 50 MG tablet Commonly known as: LOPRESSOR Take 50 mg by mouth 2 (two) times daily.   nitroGLYCERIN 0.4 MG SL tablet Commonly known as: NITROSTAT Place 0.4 mg under the tongue every 5 (five) minutes as needed for chest pain.   pantoprazole 40 MG tablet Commonly known as: PROTONIX Take 1 tablet (40 mg total) by mouth daily.   Potassium Chloride ER 20 MEQ Tbcr Take 20 mEq by mouth daily.   rOPINIRole 1 MG tablet Commonly known as: REQUIP  Take 1 mg by mouth 3 (three) times daily.   rosuvastatin 5 MG tablet Commonly known as: CRESTOR Take 1 tablet (5 mg total) daily by mouth.   senna 8.6 MG tablet Commonly known as: SENOKOT Take by mouth.   sertraline 25 MG tablet Commonly known as: ZOLOFT Take 1 tablet by mouth daily.   Vitamin D (Ergocalciferol) 1.25 MG (50000 UNIT) Caps capsule Commonly known as: DRISDOL Take 1 capsule by mouth once a week. saturdays               Discharge Care Instructions  (From admission, onward)           Start     Ordered   02/06/23 0000  Discharge wound care:       Comments: Cleanse with soap and water, pat dry, apply santyl and keep wound covered. Do this daily.   02/06/23 1153            Follow-up Information     Derek Jack, MD Follow up on 02/11/2023.   Specialty: Hematology Why: Appointment at the [facility] March 20th at 3:30pm Contact information: [place] [address] [phone]         Sharilyn Sites, MD Follow up.   Specialty: Family Medicine Contact information: [address] [place] [id number] [phone]         Graciella Freer A, DO Follow up.   Specialty: General Surgery Why: As needed Contact information: [address] [place] [facility] [address] [phone]         [place]. Schedule an appointment as soon as possible for a visit.   Contact information: [address] [place] [address] [phone]                Discharge Exam: Danley Danker Weights   02/03/23 1259  Weight: 87.1 kg  BP (!) 132/45 (BP Location: Left Arm)   Pulse 69   Temp 98.2 F (36.8 C) (Oral)   Resp 16   Ht 5\' 6"  (1.676 m)   Wt 87.1 kg   SpO2 94%   BMI 30.99 kg/m   No distress, well-appearing Clear, nonlabored No distress, alert and oriented   Condition at discharge: good  The results of significant diagnostics from this hospitalization (including imaging, microbiology, ancillary and laboratory) are listed below for reference.   Imaging Studies: CT FOOT RIGHT WO CONTRAST  Result Date: 02/05/2023 CLINICAL DATA:  Right foot redness, pain and swelling for 8 months. EXAM: CT OF THE RIGHT FOOT WITHOUT CONTRAST TECHNIQUE: Multidetector CT imaging of the right foot was performed according to the standard protocol. Multiplanar CT image reconstructions were also generated. RADIATION DOSE REDUCTION: This exam was performed according to the departmental dose-optimization program which includes automated exposure control, adjustment of the mA and/or kV according to patient size and/or use of iterative reconstruction technique. COMPARISON:  None Available. FINDINGS: Bones/Joint/Cartilage Bones are osteopenic. No bony destructive change or periosteal reaction is identified. No fracture or dislocation. No joint effusion. Ligaments Suboptimally assessed by CT. Muscles and Tendons  No intramuscular fluid collection identified. Muscles and tendons appear intact. Soft tissues Diffuse soft tissue swelling is seen. No radiopaque foreign body. No soft tissue gas. IMPRESSION: Diffuse soft tissue swelling consistent with dependent change or cellulitis. Negative for CT evidence of osteomyelitis, septic joint or abscess. Electronically Signed   By: Inge Rise M.D.   On: 02/05/2023 08:26   US ARTERIAL ABI (SCREENING LOWER EXTREMITY)  Result Date: 02/04/2023 CLINICAL DATA:  72 year old female with history of peripheral vascular disease. EXAM: NONINVASIVE PHYSIOLOGIC VASCULAR STUDY OF BILATERAL LOWER EXTREMITIES TECHNIQUE: Evaluation of both lower extremities were performed at rest, including calculation of ankle-brachial indices with single level Doppler, pressure and pulse volume recording. COMPARISON:  None Available. FINDINGS: Right ABI:  0.66 Left ABI:  0.77 Right Lower Extremity:  Monophasic arterial waveforms at the ankle. Left Lower Extremity:  Monophasic arterial waveforms at the ankle. IMPRESSION: Moderately abnormal bilateral ankle-brachial indices, right worse than left. Electronically Signed   By: Ruthann Cancer M.D.   On: 02/04/2023 14:28   DG Foot Complete Right  Result Date: 02/03/2023 CLINICAL DATA:  Wound on right heel for 6 months.  Right foot pain. EXAM: RIGHT FOOT COMPLETE - 3+ VIEW COMPARISON:  Right foot radiographs 01/30/2023 FINDINGS: There is diffuse decreased bone mineralization. Moderate plantar and posterior calcaneal heel spurs are again seen. There is mild irregularity of the posteroinferior heel soft tissues, appearing superficial and at least 1.7 cm away from the calcaneus. Mild dorsal talonavicular degenerative osteophytosis. Mild-to-moderate second through fifth interphalangeal and mild second tarsometatarsal joint space narrowing. No acute fracture is seen. No dislocation. No definite cortical erosion. IMPRESSION: Mild irregularity of the posteroinferior  heel soft tissues, appearing superficial and at least 1.7 cm away from the calcaneus, unchanged from recent prior radiographs. No definite cortical erosion to suggest osteomyelitis. Electronically Signed   By: Yvonne Kendall M.D.   On: 02/03/2023 14:33   DG Foot Complete Right  Result Date: 01/30/2023 CLINICAL DATA:  Heel wound. EXAM: RIGHT FOOT COMPLETE - 3 VIEW COMPARISON:  None Available. FINDINGS: Osteopenia. Diffuse soft tissue swelling. No fracture or dislocation. Moderate well corticated plantar and Achilles calcaneal spurs. No definite erosive changes. There is a focal soft tissue irregularity on the lateral view dorsal and caudal to the calcaneus. Overall if there is further concern of osteomyelitis, additional workup with MRI or bone scan can be performed for further sensitivity as clinically appropriate IMPRESSION: Soft tissue swelling with subtle soft tissue defect dorsal and caudal to the calcaneus on lateral view. Osteopenia.  Calcaneal spurs. Electronically Signed   By: Jill Side M.D.   On: 01/30/2023 12:15    Microbiology: Results for orders placed or performed during the hospital encounter of 02/03/23  Culture, blood (Routine X 2) w Reflex to ID Panel     Status: None (Preliminary result)   Collection Time: 02/04/23 12:06 PM   Specimen: BLOOD  Result Value Ref Range Status   Specimen Description BLOOD BLOOD LEFT HAND  Final   Special Requests   Final    BOTTLES DRAWN AEROBIC AND ANAEROBIC Blood Culture adequate volume   Culture   Final    NO GROWTH 2 DAYS Performed at [facility], [address] [place], [place] [address]    Report Status PENDING  Incomplete  Culture, blood (Routine X 2) w Reflex to ID Panel     Status: None (Preliminary result)   Collection Time: 02/04/23 12:06 PM   Specimen: BLOOD  Result Value Ref Range Status   Specimen Description BLOOD BLOOD LEFT HAND  Final   Special Requests   Final    BOTTLES DRAWN AEROBIC AND ANAEROBIC Blood Culture adequate  volume   Culture   Final    NO GROWTH 2 DAYS Performed at [facility], [address] [place], [place] [address]    Report Status PENDING  Incomplete    Labs: CBC: Recent Labs  Lab 02/03/23  1317 02/04/23 0359 02/05/23 0357 02/06/23 0734  WBC 5.1 4.8 4.3  --   NEUTROABS 3.7  --  2.9  --   HGB 11.8* 11.0* 9.9* 10.1*  HCT 37.9 34.9* 31.5* 32.4*  MCV 89.2 89.9 89.5  --   PLT 190 162 155  --    Basic Metabolic Panel: Recent Labs  Lab 02/03/23 1317 02/04/23 0359 02/05/23 0357  NA 140 138 136  K 3.6 3.7 3.9  CL 104 104 102  CO2 26 24 23   GLUCOSE 150* 105* 119*  BUN 33* 32* 32*  CREATININE 2.00* 1.84* 1.94*  CALCIUM 8.6* 8.5* 8.2*  MG  --   --  1.9   Liver Function Tests: Recent Labs  Lab 02/03/23 1317 02/05/23 0357  AST 33 26  ALT 18 14  ALKPHOS 110 84  BILITOT 0.5 0.4  PROT 7.2 6.1*  ALBUMIN 3.5 3.0*   CBG: Recent Labs  Lab 02/05/23 1135 02/05/23 1703 02/05/23 2036 02/06/23 0726 02/06/23 1102  GLUCAP 157* 153* 214* 122* 173*    Discharge time spent: greater than 30 minutes.  Signed: Patrecia Pour, MD [facility] 02/06/2023

## 2023-02-06 NOTE — Progress Notes (Signed)
Pharmacy Antibiotic Note  Kristen Garner is a 73 y.o. female admitted on 02/03/2023 with worsening R heel wound/ulcer. Recently was started on PO antibiotics without improvement. Pharmacy has been consulted for vancomycin dosing.  Patient currently afebrile, wbc normal. Scr continues to be at baseline at 1.9.   Plan: Vancomycin 1000mg  IV q48 hours (eAUC 475, Scr 2, Vd 0.5) Ceftriaxone 2g IV q24 hours per MD (starting 3/13 PM) Plan to discharge today on oral antibiotics per primary team  Height: 5\' 6"  (167.6 cm) Weight: 87.1 kg (192 lb) IBW/kg (Calculated) : 59.3  Temp (24hrs), Avg:97.9 F (36.6 C), Min:97.6 F (36.4 C), Max:98.2 F (36.8 C)  Recent Labs  Lab 01/30/23 1223 02/03/23 1317 02/04/23 0359 02/05/23 0357  WBC 6.0 5.1 4.8 4.3  CREATININE 2.02* 2.00* 1.84* 1.94*     Estimated Creatinine Clearance: 29.1 mL/min (A) (by C-G formula based on SCr of 1.94 mg/dL (H)).    Allergies  Allergen Reactions   Adhesive [Tape] Rash   Codeine Itching   Penicillins Other (See Comments)    Child hood allergy Has patient had a PCN reaction causing immediate rash, facial/tongue/throat swelling, SOB or lightheadedness with hypotension: Unknown Has patient had a PCN reaction causing severe rash involving mucus membranes or skin necrosis: Unknown Has patient had a PCN reaction that required hospitalization: No Has patient had a PCN reaction occurring within the last 10 years: No If all of the above answers are "NO", then may proceed with Cephalosporin use.    Sulfonamide Derivatives Itching   Iodine     Patient states that she got a cat scratch and her mom put iodine on it and then got catch scratch fever    Antimicrobials this admission: Cefepime x1 in ED on 3/12 Vancomycin 3/12 >>  Ceftriaxone 3/13 >>  Dose adjustments this admission: N/a Microbiology results: 3/13 bld: ngtd  Thank you for allowing pharmacy to be a part of this patient's care.  Erin Hearing PharmD.,  BCPS Clinical Pharmacist 02/06/2023 9:55 AM

## 2023-02-06 NOTE — Care Management Important Message (Signed)
Important Message  Patient Details  Name: Kristen Garner MRN: [id number] Date of Birth: [birth year]   Medicare Important Message Given:  Yes     Tommy Medal 02/06/2023, 11:46 AM

## 2023-02-06 NOTE — Progress Notes (Signed)
Nsg Discharge Note  Admit Date:  02/03/2023 Discharge date: 02/06/2023   KHAMYAH STOHR to be D/C'd Home per MD order.  AVS completed.  Patient/caregiver able to verbalize understanding.  Discharge Medication: Allergies as of 02/06/2023       Reactions   Adhesive [tape] Rash   Codeine Itching   Penicillins Other (See Comments)   Child hood allergy Has patient had a PCN reaction causing immediate rash, facial/tongue/throat swelling, SOB or lightheadedness with hypotension: Unknown Has patient had a PCN reaction causing severe rash involving mucus membranes or skin necrosis: Unknown Has patient had a PCN reaction that required hospitalization: No Has patient had a PCN reaction occurring within the last 10 years: No If all of the above answers are "NO", then may proceed with Cephalosporin use.   Sulfonamide Derivatives Itching   Iodine    Patient states that she got a cat scratch and her mom put iodine on it and then got catch scratch fever        Medication List     STOP taking these medications    ALPRAZolam 1 MG tablet Commonly known as: XANAX   amiodarone 200 MG tablet Commonly known as: PACERONE   escitalopram 10 MG tablet Commonly known as: LEXAPRO   HYDROcodone-acetaminophen 5-325 MG tablet Commonly known as: NORCO/VICODIN   metoprolol succinate 100 MG 24 hr tablet Commonly known as: TOPROL-XL   metoprolol succinate 50 MG 24 hr tablet Commonly known as: TOPROL-XL   zolpidem 10 MG tablet Commonly known as: AMBIEN       TAKE these medications    albuterol 108 (90 Base) MCG/ACT inhaler Commonly known as: VENTOLIN HFA Inhale 2 puffs into the lungs every 4 (four) hours as needed for wheezing or shortness of breath.   Antacid 420 MG Chew chewable tablet Generic drug: calcium carbonate Chew 420 mg daily as needed by mouth for indigestion or heartburn.   aspirin EC 81 MG tablet Take 1 tablet (81 mg total) by mouth daily.   cephALEXin 500 MG  capsule Commonly known as: KEFLEX Take 1 capsule (500 mg total) by mouth 2 (two) times daily for 7 days. What changed: when to take this   clopidogrel 75 MG tablet Commonly known as: PLAVIX Take 1 tablet (75 mg total) daily by mouth.   collagenase 250 UNIT/GM ointment Commonly known as: SANTYL Apply topically daily. Start taking on: February 07, 2023   diclofenac Sodium 1 % Gel Commonly known as: VOLTAREN Apply topically.   doxycycline 100 MG capsule Commonly known as: VIBRAMYCIN Take 1 capsule (100 mg total) by mouth 2 (two) times daily for 7 days.   ferrous sulfate 325 (65 FE) MG EC tablet Take by mouth.   furosemide 40 MG tablet Commonly known as: LASIX Take 1 tablet (40 mg total) daily by mouth.   gabapentin 300 MG capsule Commonly known as: NEURONTIN Take 300 mg by mouth 3 (three) times daily.   guaiFENesin 600 MG 12 hr tablet Commonly known as: MUCINEX Take 1 tablet (600 mg total) by mouth 2 (two) times daily as needed for cough or to loosen phlegm.   levothyroxine 75 MCG tablet Commonly known as: SYNTHROID Take 75 mcg by mouth daily. What changed: Another medication with the same name was removed. Continue taking this medication, and follow the directions you see here.   losartan 25 MG tablet Commonly known as: COZAAR Take 25 mg by mouth daily.   metoprolol tartrate 50 MG tablet Commonly known as: LOPRESSOR Take 50  mg by mouth 2 (two) times daily.   nitroGLYCERIN 0.4 MG SL tablet Commonly known as: NITROSTAT Place 0.4 mg under the tongue every 5 (five) minutes as needed for chest pain.   pantoprazole 40 MG tablet Commonly known as: PROTONIX Take 1 tablet (40 mg total) by mouth daily.   Potassium Chloride ER 20 MEQ Tbcr Take 20 mEq by mouth daily.   rOPINIRole 1 MG tablet Commonly known as: REQUIP Take 1 mg by mouth 3 (three) times daily.   rosuvastatin 5 MG tablet Commonly known as: CRESTOR Take 1 tablet (5 mg total) daily by mouth.   senna 8.6  MG tablet Commonly known as: SENOKOT Take by mouth.   sertraline 25 MG tablet Commonly known as: ZOLOFT Take 1 tablet by mouth daily.   Vitamin D (Ergocalciferol) 1.25 MG (50000 UNIT) Caps capsule Commonly known as: DRISDOL Take 1 capsule by mouth once a week. saturdays               Discharge Care Instructions  (From admission, onward)           Start     Ordered   02/06/23 0000  Discharge wound care:       Comments: Cleanse with soap and water, pat dry, apply santyl and keep wound covered. Do this daily.   02/06/23 1153            Discharge Assessment: Vitals:   02/06/23 0158 02/06/23 0503  BP: (!) 126/56 (!) 132/45  Pulse: 76 69  Resp:  16  Temp:  98.2 F (36.8 C)  SpO2:  94%   Skin clean, dry and intact without evidence of skin break down, no evidence of skin tears noted. IV catheter discontinued intact. Site without signs and symptoms of complications - no redness or edema noted at insertion site, patient denies c/o pain - only slight tenderness at site.  Dressing with slight pressure applied.  D/c Instructions-Education: Discharge instructions given to patient/family with verbalized understanding. D/c education completed with patient/family including follow up instructions, medication list, d/c activities limitations if indicated, with other d/c instructions as indicated by MD - patient able to verbalize understanding, all questions fully answered. Patient instructed to return to ED, call 911, or call MD for any changes in condition.  Patient escorted via [place], and D/C home via private auto.  Kathie Rhodes, RN 02/06/2023 11:57 AM

## 2023-02-06 NOTE — Progress Notes (Signed)
Wound care completed on R heel. Prevalon boot provided. Patient unable to sleep with it on, states it is uncomfortable when laying in the bed.

## 2023-02-06 NOTE — TOC Transition Note (Signed)
Transition of Care [facility]) - CM/SW Discharge Note   Patient Details  Name: Kristen Garner MRN: [id number] Date of Birth: [birth year]  Transition of Care [facility]) CM/SW Contact:  Salome Arnt, LCSW Phone Number: 02/06/2023, 10:46 AM   Clinical Narrative:  Pt d/c today. MD ordered RN for wound care. Discussed with pt who declines home health and said she can do wound care on own. MD updated. Pt to follow up with podiatry in 1-2 weeks. No other needs reported.      Final next level of care: Home/Self Care Barriers to Discharge: Barriers Resolved   Patient Goals and CMS Choice CMS Medicare.gov Compare Post Acute Care list provided to:: Patient Choice offered to / list presented to : Patient  Discharge Placement                      Patient and family notified of of transfer: 02/06/23  Discharge Plan and Services Additional resources added to the After Visit Summary for   In-house Referral: Clinical Social Work Discharge Planning Services: CM Consult                      HH Arranged: Refused HH          Social Determinants of Health (SDOH) Interventions SDOH Screenings   Tobacco Use: Medium Risk (02/03/2023)     Readmission Risk Interventions    02/04/2023    3:48 PM  Readmission Risk Prevention Plan  Transportation Screening Complete  HRI or Home Care Consult Complete  Social Work Consult for [place] Planning/Counseling Complete  Palliative Care Screening Not Applicable  Medication Review [employer]) Complete

## 2023-02-09 ENCOUNTER — Ambulatory Visit (HOSPITAL_COMMUNITY): Admission: RE | Admit: 2023-02-09 | Payer: Medicare Other | Source: Ambulatory Visit

## 2023-02-09 ENCOUNTER — Encounter (HOSPITAL_COMMUNITY): Payer: Self-pay

## 2023-02-09 LAB — CULTURE, BLOOD (ROUTINE X 2)
Culture: NO GROWTH
Culture: NO GROWTH
Special Requests: ADEQUATE
Special Requests: ADEQUATE

## 2023-02-10 NOTE — Progress Notes (Incomplete)
[facility] [address] [place] [address]   Clinic Day:  02/10/2023  Referring physician: Sharilyn Sites, MD  Patient Care Team: Sharilyn Sites, MD as PCP - General (Family Medicine) Gala Romney Cristopher Estimable, MD as Consulting Physician (Gastroenterology)   ASSESSMENT & PLAN:   Assessment: ***  Plan: ***  No orders of the defined types were placed in this encounter.     I,Katie Daubenspeck,acting as a Education administrator for Derek Jack, MD.,have documented all relevant documentation on the behalf of Derek Jack, MD,as directed by  Derek Jack, MD while in the presence of Derek Jack, MD.   ***  Katie Daubenspeck   3/19/202410:43 PM  CHIEF COMPLAINT/PURPOSE OF CONSULT:   Diagnosis: elevated free kappa/lambda chain ratio  Current Therapy:  ***  HISTORY OF PRESENT ILLNESS:   Kristen Garner is a 72 y.o. female presenting to clinic today for evaluation of elevated free kappa/lambda chain ratio at the request of [place]. Of note, she was previously seen by Dr. Tressie Stalker and Robynn Pane, PA for breast cancer and osteopenia.  Today, she states that she is doing well overall. Her appetite level is at ***%. Her energy level is at ***%.  She was referred to Dr. Theador Hawthorne in nephrology in 11/2022 for CKD. She underwent UPEP/SPEP work up at that time, and her labs showed a negative M spike but an elevated free Kappa/Lambda ratio of 2.63.  PAST MEDICAL HISTORY:   Past Medical History: Past Medical History:  Diagnosis Date   Abnormal chest CT    a. CTA 09/2016: ""1.4 cm aortopulmonary window node seen, of uncertain significance"   Anxiety    Arthritis    Breast cancer ([facility])    right   Breast cancer (HCC)    CAD (coronary artery disease)    a. 09/2016: cath showing 3-v disease with severe stenosis along RCA, CTO of PL branch, mild LAD stenosis, and moderate D1 stenosis. S/p DES x2 to RCA on 10/14/2016.   Chronic systolic CHF (congestive heart  failure) ([facility])    a. 09/2016: Echo showing EF of 20-25% with diffuse HK   CKD (chronic kidney disease), stage III ([facility])    Depression    Diabetes mellitus    takes only the pill   Headache(784.0)    occiasional migraine   Hyperlipidemia    Hypertension    Hypokalemia    Noncompliance 01/04/2015   Osteopenia 01/04/2015   PAT (paroxysmal atrial tachycardia)    PVC's (premature ventricular contractions)    Shingles    Skin cancer 2015   right leg    Surgical History: Past Surgical History:  Procedure Laterality Date   ABDOMINAL HYSTERECTOMY     BREAST BIOPSY Right    biopsy then wide excision    CARDIAC CATHETERIZATION N/A 10/13/2016   Procedure: Left Heart Cath and Coronary Angiography;  Surgeon: Burnell Blanks, MD;  Location: [facility];  Service: Cardiovascular;  Laterality: N/A;   CARDIAC CATHETERIZATION N/A 10/14/2016   Procedure: Coronary Stent Intervention;  Surgeon: Burnell Blanks, MD;  Location: [facility];  Service: Cardiovascular;  Laterality: N/A;   CHOLECYSTECTOMY     CORONARY ANGIOPLASTY WITH STENT PLACEMENT  10/14/2016   ESOPHAGOGASTRODUODENOSCOPY N/A 10/31/2017   Procedure: ESOPHAGOGASTRODUODENOSCOPY (EGD);  Surgeon: Daneil Dolin, MD;  [facility];  Service: Endoscopy;  Laterality: N/A;   PARTIAL MASTECTOMY WITH AXILLARY SENTINEL LYMPH NODE BIOPSY     SENTINEL LYMPH NODE BIOPSY     SKIN CANCER  EXCISION Right 2015    Social History: Social History   Socioeconomic History   Marital status: Widowed    Spouse name: Not on file   Number of children: Not on file   Years of education: Not on file   Highest education level: Not on file  Occupational History   Not on file  Tobacco Use   Smoking status: Former   Smokeless tobacco: Never  Vaping Use   Vaping Use: Never used  Substance and Sexual Activity   Alcohol use: No   Drug use: No   Sexual activity: Yes    Birth control/protection: Surgical  Other Topics  Concern   Not on file  Social History Narrative   Not on file   Social Determinants of Health   Financial Resource Strain: Not on file  Food Insecurity: Not on file  Transportation Needs: Not on file  Physical Activity: Not on file  Stress: Not on file  Social Connections: Not on file  Intimate Partner Violence: Not on file    Family History: Family History  Problem Relation Age of Onset   Diabetes Father    Stroke Maternal Grandmother     Current Medications:  Current Outpatient Medications:    albuterol (PROVENTIL HFA;VENTOLIN HFA) 108 (90 Base) MCG/ACT inhaler, Inhale 2 puffs into the lungs every 4 (four) hours as needed for wheezing or shortness of breath., Disp: 1 Inhaler, Rfl: 2   aspirin EC 81 MG EC tablet, Take 1 tablet (81 mg total) by mouth daily., Disp: , Rfl:    calcium carbonate (ANTACID) 420 MG CHEW chewable tablet, Chew 420 mg daily as needed by mouth for indigestion or heartburn., Disp: , Rfl:    cephALEXin (KEFLEX) 500 MG capsule, Take 1 capsule (500 mg total) by mouth 2 (two) times daily for 7 days., Disp: 14 capsule, Rfl: 0   clopidogrel (PLAVIX) 75 MG tablet, Take 1 tablet (75 mg total) daily by mouth., Disp: 30 tablet, Rfl: 0   collagenase (SANTYL) 250 UNIT/GM ointment, Apply topically daily., Disp: 15 g, Rfl: 0   diclofenac Sodium (VOLTAREN) 1 % GEL, Apply topically., Disp: , Rfl:    doxycycline (VIBRAMYCIN) 100 MG capsule, Take 1 capsule (100 mg total) by mouth 2 (two) times daily for 7 days., Disp: 14 capsule, Rfl: 0   ferrous sulfate 325 (65 FE) MG EC tablet, Take by mouth., Disp: , Rfl:    furosemide (LASIX) 40 MG tablet, Take 1 tablet (40 mg total) daily by mouth., Disp: 30 tablet, Rfl: 11   gabapentin (NEURONTIN) 300 MG capsule, Take 300 mg by mouth 3 (three) times daily., Disp: , Rfl:    guaiFENesin (MUCINEX) 600 MG 12 hr tablet, Take 1 tablet (600 mg total) by mouth 2 (two) times daily as needed for cough or to loosen phlegm., Disp: 30 tablet, Rfl:  0   levothyroxine (SYNTHROID) 75 MCG tablet, Take 75 mcg by mouth daily., Disp: , Rfl:    losartan (COZAAR) 25 MG tablet, Take 25 mg by mouth daily., Disp: , Rfl:    metoprolol tartrate (LOPRESSOR) 50 MG tablet, Take 50 mg by mouth 2 (two) times daily., Disp: , Rfl:    nitroGLYCERIN (NITROSTAT) 0.4 MG SL tablet, Place 0.4 mg under the tongue every 5 (five) minutes as needed for chest pain., Disp: , Rfl:    pantoprazole (PROTONIX) 40 MG tablet, Take 1 tablet (40 mg total) by mouth daily., Disp: 30 tablet, Rfl: 1   Potassium Chloride ER 20 MEQ TBCR, Take  20 mEq by mouth daily., Disp: 30 tablet, Rfl: 11   rOPINIRole (REQUIP) 1 MG tablet, Take 1 mg by mouth 3 (three) times daily. , Disp: , Rfl: 1   rosuvastatin (CRESTOR) 5 MG tablet, Take 1 tablet (5 mg total) daily by mouth., Disp: 90 tablet, Rfl: 3   senna (SENOKOT) 8.6 MG tablet, Take by mouth., Disp: , Rfl:    sertraline (ZOLOFT) 25 MG tablet, Take 1 tablet by mouth daily., Disp: , Rfl:    Vitamin D, Ergocalciferol, (DRISDOL) 1.25 MG (50000 UT) CAPS capsule, Take 1 capsule by mouth once a week. saturdays, Disp: , Rfl:    Allergies: Allergies  Allergen Reactions   Adhesive [Tape] Rash   Codeine Itching   Penicillins Other (See Comments)    Child hood allergy Has patient had a PCN reaction causing immediate rash, facial/tongue/throat swelling, SOB or lightheadedness with hypotension: Unknown Has patient had a PCN reaction causing severe rash involving mucus membranes or skin necrosis: Unknown Has patient had a PCN reaction that required hospitalization: No Has patient had a PCN reaction occurring within the last 10 years: No If all of the above answers are "NO", then may proceed with Cephalosporin use.    Sulfonamide Derivatives Itching   Iodine     Patient states that she got a cat scratch and her mom put iodine on it and then got catch scratch fever    REVIEW OF SYSTEMS:   Review of Systems  Constitutional:  Negative for chills,  fatigue and fever.  HENT:   Negative for lump/mass, mouth sores, nosebleeds, sore throat and trouble swallowing.   Eyes:  Negative for eye problems.  Respiratory:  Negative for cough and shortness of breath.   Cardiovascular:  Negative for chest pain, leg swelling and palpitations.  Gastrointestinal:  Negative for abdominal pain, constipation, diarrhea, nausea and vomiting.  Genitourinary:  Negative for bladder incontinence, difficulty urinating, dysuria, frequency, hematuria and nocturia.   Musculoskeletal:  Negative for arthralgias, back pain, flank pain, myalgias and neck pain.  Skin:  Negative for itching and rash.  Neurological:  Negative for dizziness, headaches and numbness.  Hematological:  Does not bruise/bleed easily.  Psychiatric/Behavioral:  Negative for depression, sleep disturbance and suicidal ideas. The patient is not nervous/anxious.   All other systems reviewed and are negative.    VITALS:   There were no vitals taken for this visit.  Wt Readings from Last 3 Encounters:  02/03/23 192 lb (87.1 kg)  01/15/19 177 lb (80.3 kg)  12/01/17 168 lb (76.2 kg)    There is no height or weight on file to calculate BMI.   PHYSICAL EXAM:   Physical Exam Vitals and nursing note reviewed. Exam conducted with a chaperone present.  Constitutional:      Appearance: Normal appearance.  Cardiovascular:     Rate and Rhythm: Normal rate and regular rhythm.     Pulses: Normal pulses.     Heart sounds: Normal heart sounds.  Pulmonary:     Effort: Pulmonary effort is normal.     Breath sounds: Normal breath sounds.  Abdominal:     Palpations: Abdomen is soft. There is no hepatomegaly, splenomegaly or mass.     Tenderness: There is no abdominal tenderness.  Musculoskeletal:     Right lower leg: No edema.     Left lower leg: No edema.  Lymphadenopathy:     Cervical: No cervical adenopathy.     Right cervical: No superficial, deep or posterior cervical adenopathy.  Left  cervical: No superficial, deep or posterior cervical adenopathy.     Upper Body:     Right upper body: No supraclavicular or axillary adenopathy.     Left upper body: No supraclavicular or axillary adenopathy.  Neurological:     General: No focal deficit present.     Mental Status: She is alert and oriented to person, place, and time.  Psychiatric:        Mood and Affect: Mood normal.        Behavior: Behavior normal.     LABS:      Latest Ref Rng & Units 02/06/2023    7:34 AM 02/05/2023    3:57 AM 02/04/2023    3:59 AM  CBC  WBC 4.0 - 10.5 K/uL  4.3  4.8   Hemoglobin 12.0 - 15.0 g/dL 10.1  9.9  11.0   Hematocrit 36.0 - 46.0 % 32.4  31.5  34.9   Platelets 150 - 400 K/uL  155  162       Latest Ref Rng & Units 02/05/2023    3:57 AM 02/04/2023    3:59 AM 02/03/2023    1:17 PM  CMP  Glucose 70 - 99 mg/dL 119  105  150   BUN 8 - 23 mg/dL 32  32  33   Creatinine 0.44 - 1.00 mg/dL 1.94  1.84  2.00   Sodium 135 - 145 mmol/L 136  138  140   Potassium 3.5 - 5.1 mmol/L 3.9  3.7  3.6   Chloride 98 - 111 mmol/L 102  104  104   CO2 22 - 32 mmol/L 23  24  26    Calcium 8.9 - 10.3 mg/dL 8.2  8.5  8.6   Total Protein 6.5 - 8.1 g/dL 6.1   7.2   Total Bilirubin 0.3 - 1.2 mg/dL 0.4   0.5   Alkaline Phos 38 - 126 U/L 84   110   AST 15 - 41 U/L 26   33   ALT 0 - 44 U/L 14   18      No results found for: "CEA1", "CEA" / No results found for: "CEA1", "CEA" No results found for: "PSA1" No results found for: "[id number]" No results found for: "CAN125"  No results found for: "TOTALPROTELP", "ALBUMINELP", "A1GS", "A2GS", "BETS", "BETA2SER", "GAMS", "MSPIKE", "SPEI" No results found for: "TIBC", "FERRITIN", "IRONPCTSAT" No results found for: "LDH"   STUDIES:   CT FOOT RIGHT WO CONTRAST  Result Date: 02/05/2023 CLINICAL DATA:  Right foot redness, pain and swelling for 8 months. EXAM: CT OF THE RIGHT FOOT WITHOUT CONTRAST TECHNIQUE: Multidetector CT imaging of the right foot was performed  according to the standard protocol. Multiplanar CT image reconstructions were also generated. RADIATION DOSE REDUCTION: This exam was performed according to the departmental dose-optimization program which includes automated exposure control, adjustment of the mA and/or kV according to patient size and/or use of iterative reconstruction technique. COMPARISON:  None Available. FINDINGS: Bones/Joint/Cartilage Bones are osteopenic. No bony destructive change or periosteal reaction is identified. No fracture or dislocation. No joint effusion. Ligaments Suboptimally assessed by CT. Muscles and Tendons No intramuscular fluid collection identified. Muscles and tendons appear intact. Soft tissues Diffuse soft tissue swelling is seen. No radiopaque foreign body. No soft tissue gas. IMPRESSION: Diffuse soft tissue swelling consistent with dependent change or cellulitis. Negative for CT evidence of osteomyelitis, septic joint or abscess. Electronically Signed   By: Inge Rise M.D.   On: 02/05/2023 08:26  US ARTERIAL ABI (SCREENING LOWER EXTREMITY)  Result Date: 02/04/2023 CLINICAL DATA:  72 year old female with history of peripheral vascular disease. EXAM: NONINVASIVE PHYSIOLOGIC VASCULAR STUDY OF BILATERAL LOWER EXTREMITIES TECHNIQUE: Evaluation of both lower extremities were performed at rest, including calculation of ankle-brachial indices with single level Doppler, pressure and pulse volume recording. COMPARISON:  None Available. FINDINGS: Right ABI:  0.66 Left ABI:  0.77 Right Lower Extremity:  Monophasic arterial waveforms at the ankle. Left Lower Extremity:  Monophasic arterial waveforms at the ankle. IMPRESSION: Moderately abnormal bilateral ankle-brachial indices, right worse than left. Electronically Signed   By: Ruthann Cancer M.D.   On: 02/04/2023 14:28   DG Foot Complete Right  Result Date: 02/03/2023 CLINICAL DATA:  Wound on right heel for 6 months.  Right foot pain. EXAM: RIGHT FOOT COMPLETE - 3+  VIEW COMPARISON:  Right foot radiographs 01/30/2023 FINDINGS: There is diffuse decreased bone mineralization. Moderate plantar and posterior calcaneal heel spurs are again seen. There is mild irregularity of the posteroinferior heel soft tissues, appearing superficial and at least 1.7 cm away from the calcaneus. Mild dorsal talonavicular degenerative osteophytosis. Mild-to-moderate second through fifth interphalangeal and mild second tarsometatarsal joint space narrowing. No acute fracture is seen. No dislocation. No definite cortical erosion. IMPRESSION: Mild irregularity of the posteroinferior heel soft tissues, appearing superficial and at least 1.7 cm away from the calcaneus, unchanged from recent prior radiographs. No definite cortical erosion to suggest osteomyelitis. Electronically Signed   By: Yvonne Kendall M.D.   On: 02/03/2023 14:33   DG Foot Complete Right  Result Date: 01/30/2023 CLINICAL DATA:  Heel wound. EXAM: RIGHT FOOT COMPLETE - 3 VIEW COMPARISON:  None Available. FINDINGS: Osteopenia. Diffuse soft tissue swelling. No fracture or dislocation. Moderate well corticated plantar and Achilles calcaneal spurs. No definite erosive changes. There is a focal soft tissue irregularity on the lateral view dorsal and caudal to the calcaneus. Overall if there is further concern of osteomyelitis, additional workup with MRI or bone scan can be performed for further sensitivity as clinically appropriate IMPRESSION: Soft tissue swelling with subtle soft tissue defect dorsal and caudal to the calcaneus on lateral view. Osteopenia.  Calcaneal spurs. Electronically Signed   By: Jill Side M.D.   On: 01/30/2023 12:15

## 2023-02-11 ENCOUNTER — Inpatient Hospital Stay: Payer: Medicare Other | Attending: Hematology | Admitting: Hematology

## 2023-03-09 ENCOUNTER — Encounter: Payer: Self-pay | Admitting: Gastroenterology

## 2023-03-09 ENCOUNTER — Ambulatory Visit: Payer: Medicare Other | Admitting: Gastroenterology

## 2023-12-03 ENCOUNTER — Ambulatory Visit: Payer: Medicare Other | Attending: Internal Medicine | Admitting: Internal Medicine

## 2023-12-03 ENCOUNTER — Encounter: Payer: Self-pay | Admitting: Internal Medicine

## 2023-12-03 VITALS — BP 124/62 | HR 76 | Ht 66.0 in

## 2023-12-03 DIAGNOSIS — I255 Ischemic cardiomyopathy: Secondary | ICD-10-CM

## 2023-12-03 DIAGNOSIS — Z95810 Presence of automatic (implantable) cardiac defibrillator: Secondary | ICD-10-CM | POA: Insufficient documentation

## 2023-12-03 NOTE — Progress Notes (Signed)
 Cardiology Office Note  Date: 12/03/2023   ID: Kristen Garner, DOB [birth year], MRN [id number]  PCP:  Marvine Rush, MD  Cardiologist:  Diannah SHAUNNA Maywood, MD Electrophysiologist:  None   History of Present Illness: Kristen Garner is a 73 y.o. female known to have chronic systolic heart failure LVEF 30 to 35% in 2022 s/p CRT-D, three-vessel CAD in 2017 s/p RCA PCI x 2, HTN, DM 2, CKD stage IIIb, atrial tachycardia was referred to cardiology clinic to establish care. She was previously seen by Dr. Charls in 2018.  After 2018, she has been followed by cardiology at [facility].  I do not have medical records to review.  She currently wants to follow-up with [place] [facility] and eating.  Denies having any DOE, orthopnea, PND or leg swelling although she does endorse some SOB with activities but not much.  No angina, dizziness, lightheadedness, syncope, palpitations.  Compliant with medications and has no side effects.  She follows up with nephrology for management of CKD stage IIIb.  She also has burning sensation in her legs for which she takes gabapentin  but she is requesting for alternative medication for which I asked her to follow-up with her PCP.  Patient reported that she was taken urgently to [facility] for CRT-D placement.  Unclear if she had cardiac syncope or cardiac arrest leading to CRT-D placement.  She is currently on amiodarone  200 mg once daily.  Unclear why.  On DAPT, no bleeding complications.  Past Medical History:  Diagnosis Date   Abnormal chest CT    a. CTA 09/2016: 1.4 cm aortopulmonary window node seen, of uncertain significance   Anxiety    Arthritis    Breast cancer ([facility])    right   Breast cancer (HCC)    CAD (coronary artery disease)    a. 09/2016: cath showing 3-v disease with severe stenosis along RCA, CTO of PL branch, mild LAD stenosis, and moderate D1 stenosis. S/p DES x2 to RCA on 10/14/2016.   Chronic systolic CHF (congestive heart failure)  ([facility])    a. 09/2016: Echo showing EF of 20-25% with diffuse HK   CKD (chronic kidney disease), stage III ([facility])    Depression    Diabetes mellitus    takes only the pill   Headache(784.0)    occiasional migraine   Hyperlipidemia    Hypertension    Hypokalemia    Noncompliance 01/04/2015   Osteopenia 01/04/2015   PAT (paroxysmal atrial tachycardia) ([facility]CC)    PVC's (premature ventricular contractions)    Shingles    Skin cancer 2015   right leg    Past Surgical History:  Procedure Laterality Date   ABDOMINAL HYSTERECTOMY     BREAST BIOPSY Right    biopsy then wide excision    CARDIAC CATHETERIZATION N/A 10/13/2016   Procedure: Left Heart Cath and Coronary Angiography;  Surgeon: Lonni JONETTA Cash, MD;  Location: [facility];  Service: Cardiovascular;  Laterality: N/A;   CARDIAC CATHETERIZATION N/A 10/14/2016   Procedure: Coronary Stent Intervention;  Surgeon: Lonni JONETTA Cash, MD;  Location: [facility] INVASIVE CV LAB;  Service: Cardiovascular;  Laterality: N/A;   CHOLECYSTECTOMY     CORONARY ANGIOPLASTY WITH STENT PLACEMENT  10/14/2016   ESOPHAGOGASTRODUODENOSCOPY N/A 10/31/2017   Procedure: ESOPHAGOGASTRODUODENOSCOPY (EGD);  Surgeon: Shaaron Lamar HERO, MD;  [facility];  Service: Endoscopy;  Laterality: N/A;   PARTIAL MASTECTOMY WITH AXILLARY SENTINEL LYMPH NODE BIOPSY     SENTINEL LYMPH NODE BIOPSY  SKIN CANCER EXCISION Right 2015    Current Outpatient Medications  Medication Sig Dispense Refill   amiodarone  (PACERONE ) 200 MG tablet Take 200 mg by mouth daily.     rosuvastatin  (CRESTOR ) 20 MG tablet Take 20 mg by mouth daily.     albuterol  (PROVENTIL  HFA;VENTOLIN  HFA) 108 (90 Base) MCG/ACT inhaler Inhale 2 puffs into the lungs every 4 (four) hours as needed for wheezing or shortness of breath. 1 Inhaler 2   aspirin  EC 81 MG EC tablet Take 1 tablet (81 mg total) by mouth daily.     clopidogrel  (PLAVIX ) 75 MG tablet Take 1 tablet (75 mg total) daily by  mouth. 30 tablet 0   collagenase  (SANTYL ) 250 UNIT/GM ointment Apply topically daily. 15 g 0   furosemide  (LASIX ) 40 MG tablet Take 1 tablet (40 mg total) daily by mouth. 30 tablet 11   levothyroxine  (SYNTHROID ) 75 MCG tablet Take 75 mcg by mouth daily.     losartan  (COZAAR ) 25 MG tablet Take 25 mg by mouth daily.     metoprolol  tartrate (LOPRESSOR ) 50 MG tablet Take 50 mg by mouth 2 (two) times daily.     nitroGLYCERIN  (NITROSTAT ) 0.4 MG SL tablet Place 0.4 mg under the tongue every 5 (five) minutes as needed for chest pain.     pantoprazole  (PROTONIX ) 40 MG tablet Take 1 tablet (40 mg total) by mouth daily. 30 tablet 1   Potassium Chloride  ER 20 MEQ TBCR Take 20 mEq by mouth daily. 30 tablet 11   rOPINIRole  (REQUIP ) 1 MG tablet Take 1 mg by mouth 3 (three) times daily.   1   senna (SENOKOT) 8.6 MG tablet Take by mouth.     sertraline (ZOLOFT) 25 MG tablet Take 1 tablet by mouth daily.     Vitamin D, Ergocalciferol, (DRISDOL) 1.25 MG (50000 UT) CAPS capsule Take 1 capsule by mouth once a week. saturdays     No current facility-administered medications for this visit.   Allergies:  Adhesive [tape], Codeine, Penicillins, Sulfonamide derivatives, and Iodine   Social History: The patient  reports that she has quit smoking. She has never used smokeless tobacco. She reports that she does not drink alcohol and does not use drugs.   Family History: The patient's family history includes Diabetes in her father; Stroke in her maternal grandmother.   ROS:  Please see the history of present illness. Otherwise, complete review of systems is positive for none  All other systems are reviewed and negative.   Physical Exam: VS:  BP 124/62   Pulse 76   Ht 5' 6 (1.676 m)   SpO2 96%   BMI 30.99 kg/m , BMI Body mass index is 30.99 kg/m.  Wt Readings from Last 3 Encounters:  02/03/23 192 lb (87.1 kg)  01/15/19 177 lb (80.3 kg)  12/01/17 168 lb (76.2 kg)    General: Patient appears comfortable at  rest. HEENT: Conjunctiva and lids normal, oropharynx clear with moist mucosa. Neck: Supple, no elevated JVP or carotid bruits, no thyromegaly. Lungs: Clear to auscultation, nonlabored breathing at rest. Cardiac: Regular rate and rhythm, no S3 or significant systolic murmur, no pericardial rub. Abdomen: Soft, nontender, no hepatomegaly, bowel sounds present, no guarding or rebound. Extremities: No pitting edema, distal pulses 2+. Skin: Warm and dry. Musculoskeletal: No kyphosis. Neuropsychiatric: Alert and oriented x3, affect grossly appropriate.  Recent Labwork: 02/05/2023: ALT 14; AST 26; BUN 32; Creatinine, Ser 1.94; Magnesium  1.9; Platelets 155; Potassium 3.9; Sodium 136 02/06/2023: Hemoglobin 10.1  No  results found for: CHOL, TRIG, HDL, CHOLHDL, VLDL, LDLCALC, LDLDIRECT    Assessment and Plan:   Chronic systolic heart failure Mixed cardiomyopathy LVEF 30 to 35% s/p CRT-D Three-vessel CAD in 2017 s/p RCA PCI (Medical management was recommended) Atrial tachycardia, asymptomatic HTN, DM 2, HLD  -Asymptomatic, has some/mild SOB with activities but no orthopnea, PND and leg swelling.  No angina.  Continue DAPT, rosuvastatin  20 mg nightly, GDMT (metoprolol  50 mg once daily, losartan  25 mg once daily).  Unclear why she has been on amiodarone  200 mg once daily.  Will obtain medical records from [facility].  Obtain 2D echocardiogram and refer to EP to establish care for device checks.  Remote device monitoring system at her house is not working anymore.      Medication Adjustments/Labs and Tests Ordered: Current medicines are reviewed at length with the patient today.  Concerns regarding medicines are outlined above.    Disposition:  Follow up  6 months  Signed Harshika Mago Priya Lylian Sanagustin, MD, 12/03/2023 2:18 PM    [facility] [address] [place], [place], [place] [address]

## 2023-12-03 NOTE — Patient Instructions (Signed)
 Medication Instructions:  Your physician recommends that you continue on your current medications as directed. Please refer to the Current Medication list given to you today.   Labwork: None  Testing/Procedures: Your physician has requested that you have an echocardiogram. Echocardiography is a painless test that uses sound waves to create images of your heart. It provides your doctor with information about the size and shape of your heart and how well your heart's chambers and valves are working. This procedure takes approximately one hour. There are no restrictions for this procedure. Please do NOT wear cologne, perfume, aftershave, or lotions (deodorant is allowed). Please arrive 15 minutes prior to your appointment time.  Please note: We ask at that you not bring children with you during ultrasound (echo/ vascular) testing. Due to room size and safety concerns, children are not allowed in the ultrasound rooms during exams. Our front office staff cannot provide observation of children in our lobby area while testing is being conducted. An adult accompanying a patient to their appointment will only be allowed in the ultrasound room at the discretion of the ultrasound technician under special circumstances. We apologize for any inconvenience.   Follow-Up: Your physician recommends that you schedule a follow-up appointment in: 6 months  Any Other Special Instructions Will Be Listed Below (If Applicable). Referral To Electrophysiology   Thank you for choosing [facility]!     If you need a refill on your cardiac medications before your next appointment, please call your pharmacy.

## 2023-12-16 ENCOUNTER — Other Ambulatory Visit: Payer: Medicare Other

## 2023-12-23 ENCOUNTER — Ambulatory Visit: Payer: Medicare Other | Attending: Internal Medicine

## 2023-12-23 DIAGNOSIS — I255 Ischemic cardiomyopathy: Secondary | ICD-10-CM

## 2023-12-23 LAB — ECHOCARDIOGRAM COMPLETE
AR max vel: 1.36 cm2
AV Area VTI: 1.32 cm2
AV Area mean vel: 1.42 cm2
AV Mean grad: 11 mm[Hg]
AV Peak grad: 19.4 mm[Hg]
Ao pk vel: 2.2 m/s
Area-P 1/2: 4.8 cm2
Calc EF: 43.8 %
Est EF: 55
MV VTI: 1.71 cm2
S' Lateral: 4.1 cm
Single Plane A2C EF: 41.4 %
Single Plane A4C EF: 47.3 %

## 2023-12-25 ENCOUNTER — Telehealth: Payer: Self-pay | Admitting: Internal Medicine

## 2023-12-25 NOTE — Telephone Encounter (Signed)
Patient is calling to follow up on Echo results. Please advise.

## 2023-12-28 ENCOUNTER — Telehealth: Payer: Self-pay

## 2023-12-28 MED ORDER — METOPROLOL TARTRATE 50 MG PO TABS: 75.0000 mg | ORAL_TABLET | Freq: Two times a day (BID) | ORAL | 3 refills | Status: DC

## 2023-12-28 NOTE — Telephone Encounter (Signed)
-----   Message from Vishnu P Mallipeddi sent at 12/25/2023  3:50 PM EST ----- Normal pumping function of the heart, mild MR and mild AS.  Mean mitral valve gradient is 8 mmHg consistent with moderate MS. Will repeat echocardiogram in 1 year for MS.  Increase metoprolol tartrate from 50 mg to 75 mg twice daily (goal HR 60 bpm).

## 2023-12-28 NOTE — Telephone Encounter (Signed)
Sent patient MyChart message. Tried calling patient unable to leave message.

## 2023-12-29 NOTE — Telephone Encounter (Signed)
Spoke with patient regarding echo results. Verbalized understanding that she received MyChart message and that she was aware that she was to increase her Metoprolol.

## 2023-12-31 ENCOUNTER — Encounter: Payer: Self-pay | Admitting: Internal Medicine

## 2023-12-31 ENCOUNTER — Telehealth: Payer: Self-pay

## 2023-12-31 MED ORDER — METOPROLOL SUCCINATE ER 50 MG PO TB24: 50.0000 mg | ORAL_TABLET | Freq: Every day | ORAL | 3 refills | Status: DC

## 2023-12-31 NOTE — Telephone Encounter (Signed)
 Per Dr. Mallipeddi:  Switch current metoprolol  to Metoprolol  succinate 50 mg once daily.   Patient verbalized understanding as she could not tolerate the higher dosage

## 2024-01-15 ENCOUNTER — Encounter: Payer: Self-pay | Admitting: Cardiovascular Disease

## 2024-01-15 ENCOUNTER — Ambulatory Visit: Payer: Medicare Other | Attending: Cardiovascular Disease | Admitting: Cardiovascular Disease

## 2024-01-15 VITALS — BP 130/60 | HR 64 | Ht 66.0 in | Wt 197.6 lb

## 2024-01-15 DIAGNOSIS — I255 Ischemic cardiomyopathy: Secondary | ICD-10-CM

## 2024-01-15 DIAGNOSIS — I493 Ventricular premature depolarization: Secondary | ICD-10-CM

## 2024-01-15 DIAGNOSIS — I4719 Other supraventricular tachycardia: Secondary | ICD-10-CM | POA: Diagnosis not present

## 2024-01-15 DIAGNOSIS — Z95810 Presence of automatic (implantable) cardiac defibrillator: Secondary | ICD-10-CM | POA: Diagnosis not present

## 2024-01-15 LAB — CUP PACEART INCLINIC DEVICE CHECK
Date Time Interrogation Session: 20250221151202
Implantable Lead Connection Status: 753985
Implantable Lead Connection Status: 753985
Implantable Lead Connection Status: 753985
Implantable Lead Implant Date: 20201223
Implantable Lead Implant Date: 20201223
Implantable Lead Implant Date: 20201223
Implantable Lead Location: 753858
Implantable Lead Location: 753859
Implantable Lead Location: 753860
Implantable Lead Model: 4598
Implantable Lead Model: 5076
Implantable Pulse Generator Implant Date: 20201223

## 2024-01-15 MED ORDER — AMIODARONE HCL 100 MG PO TABS: 100.0000 mg | ORAL_TABLET | Freq: Every day | ORAL | 3 refills | Status: AC

## 2024-01-15 NOTE — Patient Instructions (Signed)
Medication Instructions:  DECREASE Amiodarone to 100 mg once daily  *If you need a refill on your cardiac medications before your next appointment, please call your pharmacy*   Follow-Up: At [facility], you and your health needs are our priority.  As part of our continuing mission to provide you with exceptional heart care, we have created designated Provider Care Teams.  These Care Teams include your primary Cardiologist (physician) and Advanced Practice Providers (APPs -  Physician Assistants and Nurse Practitioners) who all work together to provide you with the care you need, when you need it.  We recommend signing up for the patient portal called "MyChart".  Sign up information is provided on this After Visit Summary.  MyChart is used to connect with patients for Virtual Visits (Telemedicine).  Patients are able to view lab/test results, encounter notes, upcoming appointments, etc.  Non-urgent messages can be sent to your provider as well.   To learn more about what you can do with MyChart, go to [email or web address].    Your next appointment:   6 month(s)  Provider:   York Pellant, MD

## 2024-01-15 NOTE — Progress Notes (Signed)
  Electrophysiology Office Note:    Date:  01/15/2024   ID:  Kristen Garner, DOB 1951/05/31, MRN [id number]  PCP:  Assunta Found, MD   [facility] Providers Cardiologist:  Marjo Bicker, MD     Referring MD: Marjo Bicker, MD   History of Present Illness:    Kristen Garner is a 73 y.o. female with a medical history significant for chronic systolic heart failure LVEF 30 to 35% in 2022 s/p CRT-D, three-vessel CAD in 2017 s/p RCA PCI x 2, HTN, DM 2, CKD stage IIIb, atrial tachycardia , referred for device management.      I discussed the use of AI scribe software for clinical note transcription with the patient, who gave verbal consent to proceed.  She had a defibrillator placed in December 2020 following an episode where she fell and was taken to the hospital. She does not recall the exact circumstances leading to the placement but mentions waking up with the device. No current problems with the defibrillator or heart rhythm issues are reported.  She has a history of atrial fibrillation and experienced skipped heartbeats prior to the defibrillator placement.      Today, she reports that she is doing well. she has no device related complaints -- no new tenderness, drainage, redness.   EKGs/Labs/Other Studies Reviewed Today:     Echocardiogram:  TTE 11/2023 EF 55%. Gr I DD. Mildly dilated LA     EKG:         Physical Exam:    VS:  BP 130/60 (BP Location: Left Arm, Patient Position: Sitting, Cuff Size: Large)   Pulse 64   Ht 5\' 6"  (1.676 m)   Wt 197 lb 9.6 oz (89.6 kg)   SpO2 93%   BMI 31.89 kg/m     Wt Readings from Last 3 Encounters:  01/15/24 197 lb 9.6 oz (89.6 kg)  02/03/23 192 lb (87.1 kg)  01/15/19 177 lb (80.3 kg)     GEN: Well nourished, well developed in no acute distress CARDIAC: RRR, no murmurs, rubs, gallops RESPIRATORY:  Normal work of breathing MUSCULOSKELETAL: no edema    ASSESSMENT & PLAN:     BiV ICD Initial  reasons for implant are unclear.  Records have been requested. I reviewed today's device interrogation in detail.  See Paceart for report She has not pacemaker dependent.  Atrial tachycardia She has had very brief atrial high rate episodes consistent with atrial tachycardia on her device interrogation She is currently on amiodarone though it is unclear if this is for a different arrhythmia Will decrease amiodarone to 100 mg daily today  History of CHF with reduced ejection fraction; EF recovered Continue losartan 25 mg, metoprolol XL 50 mg    Signed, Maurice Small, MD  01/15/2024 2:37 PM    [facility]

## 2024-01-18 DIAGNOSIS — N184 Chronic kidney disease, stage 4 (severe): Secondary | ICD-10-CM | POA: Diagnosis not present

## 2024-01-18 DIAGNOSIS — N2581 Secondary hyperparathyroidism of renal origin: Secondary | ICD-10-CM | POA: Diagnosis not present

## 2024-01-18 DIAGNOSIS — I129 Hypertensive chronic kidney disease with stage 1 through stage 4 chronic kidney disease, or unspecified chronic kidney disease: Secondary | ICD-10-CM | POA: Diagnosis not present

## 2024-01-18 DIAGNOSIS — R809 Proteinuria, unspecified: Secondary | ICD-10-CM | POA: Diagnosis not present

## 2024-01-20 DIAGNOSIS — I129 Hypertensive chronic kidney disease with stage 1 through stage 4 chronic kidney disease, or unspecified chronic kidney disease: Secondary | ICD-10-CM | POA: Diagnosis not present

## 2024-01-20 DIAGNOSIS — R809 Proteinuria, unspecified: Secondary | ICD-10-CM | POA: Diagnosis not present

## 2024-01-20 DIAGNOSIS — N2581 Secondary hyperparathyroidism of renal origin: Secondary | ICD-10-CM | POA: Diagnosis not present

## 2024-01-20 DIAGNOSIS — N184 Chronic kidney disease, stage 4 (severe): Secondary | ICD-10-CM | POA: Diagnosis not present

## 2024-01-24 ENCOUNTER — Encounter: Payer: Self-pay | Admitting: Cardiovascular Disease

## 2024-01-26 ENCOUNTER — Telehealth: Payer: Self-pay

## 2024-01-26 NOTE — Telephone Encounter (Signed)
 Attempted to contact Pt.  Unable to leave a message.  Will send mychart message.

## 2024-02-02 NOTE — Telephone Encounter (Signed)
 I let the pt know that it is okay to go ahead to plug the monitor in now. I gave her verbal instructions on how to send a manual transmission. I told her I will give her a call back if she need to do something else.

## 2024-02-05 NOTE — Telephone Encounter (Signed)
 I called pt to help her send a transmission. No answer/no voicemail.

## 2024-02-12 ENCOUNTER — Ambulatory Visit

## 2024-02-12 DIAGNOSIS — I255 Ischemic cardiomyopathy: Secondary | ICD-10-CM

## 2024-02-12 NOTE — Telephone Encounter (Signed)
 Transmission received 02/12/2024. I let the pt know I would tell Boneta Lucks we got her transmission.

## 2024-02-15 LAB — CUP PACEART REMOTE DEVICE CHECK
Battery Remaining Longevity: 54 mo
Battery Voltage: 2.97 V
Brady Statistic AP VP Percent: 11.05 %
Brady Statistic AP VS Percent: 0.05 %
Brady Statistic AS VP Percent: 87.17 %
Brady Statistic AS VS Percent: 1.72 %
Brady Statistic RA Percent Paced: 11.2 %
Brady Statistic RV Percent Paced: 96.37 %
Date Time Interrogation Session: 20250321165704
HighPow Impedance: 61 Ohm
Implantable Lead Connection Status: 753985
Implantable Lead Connection Status: 753985
Implantable Lead Connection Status: 753985
Implantable Lead Implant Date: 20201223
Implantable Lead Implant Date: 20201223
Implantable Lead Implant Date: 20201223
Implantable Lead Location: 753858
Implantable Lead Location: 753859
Implantable Lead Location: 753860
Implantable Lead Model: 4598
Implantable Lead Model: 5076
Implantable Pulse Generator Implant Date: 20201223
Lead Channel Impedance Value: 1045 Ohm
Lead Channel Impedance Value: 1121 Ohm
Lead Channel Impedance Value: 342 Ohm
Lead Channel Impedance Value: 380 Ohm
Lead Channel Impedance Value: 532 Ohm
Lead Channel Impedance Value: 570 Ohm
Lead Channel Impedance Value: 608 Ohm
Lead Channel Impedance Value: 608 Ohm
Lead Channel Impedance Value: 684 Ohm
Lead Channel Impedance Value: 798 Ohm
Lead Channel Impedance Value: 874 Ohm
Lead Channel Impedance Value: 893 Ohm
Lead Channel Impedance Value: 931 Ohm
Lead Channel Pacing Threshold Amplitude: 0.375 V
Lead Channel Pacing Threshold Amplitude: 0.5 V
Lead Channel Pacing Threshold Amplitude: 1.875 V
Lead Channel Pacing Threshold Pulse Width: 0.4 ms
Lead Channel Pacing Threshold Pulse Width: 0.4 ms
Lead Channel Pacing Threshold Pulse Width: 1 ms
Lead Channel Sensing Intrinsic Amplitude: 2.1 mV
Lead Channel Sensing Intrinsic Amplitude: 7.5 mV
Lead Channel Setting Pacing Amplitude: 1.5 V
Lead Channel Setting Pacing Amplitude: 2 V
Lead Channel Setting Pacing Amplitude: 2 V
Lead Channel Setting Pacing Pulse Width: 0.4 ms
Lead Channel Setting Pacing Pulse Width: 1 ms
Lead Channel Setting Sensing Sensitivity: 0.3 mV
Zone Setting Status: 755011
Zone Setting Status: 755011

## 2024-02-17 ENCOUNTER — Encounter: Payer: Self-pay | Admitting: Cardiovascular Disease

## 2024-02-29 DIAGNOSIS — J069 Acute upper respiratory infection, unspecified: Secondary | ICD-10-CM | POA: Diagnosis not present

## 2024-02-29 DIAGNOSIS — E1100 Type 2 diabetes mellitus with hyperosmolarity without nonketotic hyperglycemic-hyperosmolar coma (NKHHC): Secondary | ICD-10-CM | POA: Diagnosis not present

## 2024-02-29 DIAGNOSIS — L570 Actinic keratosis: Secondary | ICD-10-CM | POA: Diagnosis not present

## 2024-02-29 DIAGNOSIS — R6889 Other general symptoms and signs: Secondary | ICD-10-CM | POA: Diagnosis not present

## 2024-03-03 DIAGNOSIS — D485 Neoplasm of uncertain behavior of skin: Secondary | ICD-10-CM | POA: Diagnosis not present

## 2024-03-03 DIAGNOSIS — L538 Other specified erythematous conditions: Secondary | ICD-10-CM | POA: Diagnosis not present

## 2024-03-03 DIAGNOSIS — L858 Other specified epidermal thickening: Secondary | ICD-10-CM | POA: Diagnosis not present

## 2024-03-03 DIAGNOSIS — L821 Other seborrheic keratosis: Secondary | ICD-10-CM | POA: Diagnosis not present

## 2024-03-03 DIAGNOSIS — R208 Other disturbances of skin sensation: Secondary | ICD-10-CM | POA: Diagnosis not present

## 2024-03-22 NOTE — Addendum Note (Signed)
 Addended by: Lott Rouleau A on: 03/22/2024 02:30 PM   Modules accepted: Orders

## 2024-03-22 NOTE — Progress Notes (Signed)
 Remote ICD transmission.

## 2024-04-05 ENCOUNTER — Other Ambulatory Visit (HOSPITAL_COMMUNITY): Payer: Self-pay

## 2024-04-05 ENCOUNTER — Telehealth: Payer: Self-pay

## 2024-04-05 NOTE — Telephone Encounter (Signed)
 Pharmacy Patient Advocate Encounter   Received notification from Patient Pharmacy that prior authorization for AMIODARONE  is required/requested.   Insurance verification completed.   The patient is insured through [employer] .   Per test claim: PA required; PA started via CoverMyMeds. KEY [id number] . Please see clinical question(s) below that I am not finding the answer to in her chart and advise.  PLEASE ADVISE

## 2024-04-05 NOTE — Telephone Encounter (Signed)
 Pharmacy Patient Advocate Encounter   Received notification from CoverMyMeds that prior authorization for AMIODARONE  100 MG TAB is required/requested.   Insurance verification completed.   The patient is insured through [employer] .   Per test claim:  AMIODARONE  100 MG IS NON FORM/PLAN BENEFIT EXCLUSION. AMIODARONE  200 MG is preferred by the insurance.  If suggested medication is appropriate, Please send in a new RX and discontinue this one. If not, please advise as to why it's not appropriate so that we may request a Prior Authorization. Please note, some preferred medications may still require a PA.  If the suggested medications have not been trialed and there are no contraindications to their use, the PA will not be submitted, as it will not be approved.

## 2024-04-06 ENCOUNTER — Other Ambulatory Visit (HOSPITAL_COMMUNITY): Payer: Self-pay

## 2024-04-06 NOTE — Telephone Encounter (Signed)
 Pharmacy Patient Advocate Encounter   Received notification from Physician's Office that prior authorization for AMIODARONE  is required/requested.   Insurance verification completed.   The patient is insured through [facility] .   Per test claim: PA required; PA submitted to above mentioned insurance via CoverMyMeds Key/confirmation #/EOC [id number] Status is pending

## 2024-04-06 NOTE — Telephone Encounter (Signed)
 Pharmacy Patient Advocate Encounter  Received notification from [employer] that Prior Authorization for AMIODARONE  has been APPROVED from 04/06/24 to 11/23/24. Ran test claim, Copay is $14.23. This test claim was processed through [facility]- copay amounts may vary at other pharmacies due to pharmacy/plan contracts, or as the patient moves through the different stages of their insurance plan.

## 2024-04-07 DIAGNOSIS — L821 Other seborrheic keratosis: Secondary | ICD-10-CM | POA: Diagnosis not present

## 2024-04-07 DIAGNOSIS — C44722 Squamous cell carcinoma of skin of right lower limb, including hip: Secondary | ICD-10-CM | POA: Diagnosis not present

## 2024-04-07 DIAGNOSIS — D2271 Melanocytic nevi of right lower limb, including hip: Secondary | ICD-10-CM | POA: Diagnosis not present

## 2024-04-07 DIAGNOSIS — L988 Other specified disorders of the skin and subcutaneous tissue: Secondary | ICD-10-CM | POA: Diagnosis not present

## 2024-05-02 ENCOUNTER — Other Ambulatory Visit: Payer: Self-pay | Admitting: Internal Medicine

## 2024-05-02 DIAGNOSIS — L821 Other seborrheic keratosis: Secondary | ICD-10-CM | POA: Diagnosis not present

## 2024-05-02 DIAGNOSIS — L568 Other specified acute skin changes due to ultraviolet radiation: Secondary | ICD-10-CM | POA: Diagnosis not present

## 2024-05-13 ENCOUNTER — Ambulatory Visit: Payer: Self-pay | Admitting: Cardiovascular Disease

## 2024-05-13 ENCOUNTER — Ambulatory Visit (INDEPENDENT_AMBULATORY_CARE_PROVIDER_SITE_OTHER)

## 2024-05-13 DIAGNOSIS — I255 Ischemic cardiomyopathy: Secondary | ICD-10-CM | POA: Diagnosis not present

## 2024-05-13 LAB — CUP PACEART REMOTE DEVICE CHECK
Battery Remaining Longevity: 49 mo
Battery Voltage: 2.96 V
Brady Statistic RV Percent Paced: 91.97 %
Date Time Interrogation Session: 20250620000330
HighPow Impedance: 65 Ohm
Implantable Lead Connection Status: 753985
Implantable Lead Connection Status: 753985
Implantable Lead Connection Status: 753985
Implantable Lead Implant Date: 20201223
Implantable Lead Implant Date: 20201223
Implantable Lead Implant Date: 20201223
Implantable Lead Location: 753858
Implantable Lead Location: 753859
Implantable Lead Location: 753860
Implantable Lead Model: 4598
Implantable Lead Model: 5076
Implantable Pulse Generator Implant Date: 20201223
Lead Channel Impedance Value: 1007 Ohm
Lead Channel Impedance Value: 1026 Ohm
Lead Channel Impedance Value: 1216 Ohm
Lead Channel Impedance Value: 1254 Ohm
Lead Channel Impedance Value: 361 Ohm
Lead Channel Impedance Value: 418 Ohm
Lead Channel Impedance Value: 608 Ohm
Lead Channel Impedance Value: 665 Ohm
Lead Channel Impedance Value: 665 Ohm
Lead Channel Impedance Value: 703 Ohm
Lead Channel Impedance Value: 760 Ohm
Lead Channel Impedance Value: 855 Ohm
Lead Channel Impedance Value: 950 Ohm
Lead Channel Pacing Threshold Amplitude: 0.375 V
Lead Channel Pacing Threshold Amplitude: 0.5 V
Lead Channel Pacing Threshold Amplitude: 1.75 V
Lead Channel Pacing Threshold Pulse Width: 0.4 ms
Lead Channel Pacing Threshold Pulse Width: 0.4 ms
Lead Channel Pacing Threshold Pulse Width: 1 ms
Lead Channel Sensing Intrinsic Amplitude: 2 mV
Lead Channel Sensing Intrinsic Amplitude: 7.4 mV
Lead Channel Setting Pacing Amplitude: 1.5 V
Lead Channel Setting Pacing Amplitude: 2 V
Lead Channel Setting Pacing Amplitude: 2 V
Lead Channel Setting Pacing Pulse Width: 0.4 ms
Lead Channel Setting Pacing Pulse Width: 1 ms
Lead Channel Setting Sensing Sensitivity: 0.3 mV
Zone Setting Status: 755011
Zone Setting Status: 755011

## 2024-06-08 ENCOUNTER — Ambulatory Visit: Attending: Internal Medicine | Admitting: Internal Medicine

## 2024-06-08 NOTE — Progress Notes (Signed)
 Erroneous encounter - please disregard.

## 2024-06-14 DIAGNOSIS — Z0001 Encounter for general adult medical examination with abnormal findings: Secondary | ICD-10-CM | POA: Diagnosis not present

## 2024-06-14 DIAGNOSIS — E1140 Type 2 diabetes mellitus with diabetic neuropathy, unspecified: Secondary | ICD-10-CM | POA: Diagnosis not present

## 2024-06-14 DIAGNOSIS — E1100 Type 2 diabetes mellitus with hyperosmolarity without nonketotic hyperglycemic-hyperosmolar coma (NKHHC): Secondary | ICD-10-CM | POA: Diagnosis not present

## 2024-06-28 NOTE — Progress Notes (Signed)
 Kristen Garner

## 2024-07-08 NOTE — Progress Notes (Signed)
 Remote ICD transmission.

## 2024-08-12 ENCOUNTER — Ambulatory Visit (INDEPENDENT_AMBULATORY_CARE_PROVIDER_SITE_OTHER)

## 2024-08-12 DIAGNOSIS — I255 Ischemic cardiomyopathy: Secondary | ICD-10-CM

## 2024-08-12 LAB — CUP PACEART REMOTE DEVICE CHECK
Battery Remaining Longevity: 43 mo
Battery Voltage: 2.96 V
Brady Statistic RV Percent Paced: 70.23 %
Date Time Interrogation Session: 20250919055304
HighPow Impedance: 70 Ohm
Implantable Lead Connection Status: 753985
Implantable Lead Connection Status: 753985
Implantable Lead Connection Status: 753985
Implantable Lead Implant Date: 20201223
Implantable Lead Implant Date: 20201223
Implantable Lead Implant Date: 20201223
Implantable Lead Location: 753858
Implantable Lead Location: 753859
Implantable Lead Location: 753860
Implantable Lead Model: 4598
Implantable Lead Model: 5076
Implantable Pulse Generator Implant Date: 20201223
Lead Channel Impedance Value: 1007 Ohm
Lead Channel Impedance Value: 1064 Ohm
Lead Channel Impedance Value: 1064 Ohm
Lead Channel Impedance Value: 1235 Ohm
Lead Channel Impedance Value: 1292 Ohm
Lead Channel Impedance Value: 399 Ohm
Lead Channel Impedance Value: 437 Ohm
Lead Channel Impedance Value: 627 Ohm
Lead Channel Impedance Value: 684 Ohm
Lead Channel Impedance Value: 684 Ohm
Lead Channel Impedance Value: 684 Ohm
Lead Channel Impedance Value: 760 Ohm
Lead Channel Impedance Value: 893 Ohm
Lead Channel Pacing Threshold Amplitude: 0.375 V
Lead Channel Pacing Threshold Amplitude: 0.5 V
Lead Channel Pacing Threshold Amplitude: 2 V
Lead Channel Pacing Threshold Pulse Width: 0.4 ms
Lead Channel Pacing Threshold Pulse Width: 0.4 ms
Lead Channel Pacing Threshold Pulse Width: 1 ms
Lead Channel Sensing Intrinsic Amplitude: 2.9 mV
Lead Channel Sensing Intrinsic Amplitude: 8.8 mV
Lead Channel Setting Pacing Amplitude: 1.5 V
Lead Channel Setting Pacing Amplitude: 2 V
Lead Channel Setting Pacing Amplitude: 2 V
Lead Channel Setting Pacing Pulse Width: 0.4 ms
Lead Channel Setting Pacing Pulse Width: 1 ms
Lead Channel Setting Sensing Sensitivity: 0.3 mV
Zone Setting Status: 755011
Zone Setting Status: 755011

## 2024-08-13 ENCOUNTER — Ambulatory Visit: Payer: Self-pay | Admitting: Cardiovascular Disease

## 2024-08-16 NOTE — Telephone Encounter (Signed)
 Spoke w/ patient - she will let her transportation know about the appointment for 9/24 at 2:30pm with Thom Heinrich, PA. She states that if her transportation is unable to bring her then she will call me back at my direct number.

## 2024-08-16 NOTE — Progress Notes (Signed)
Remote ICD Transmission.

## 2024-08-16 NOTE — Telephone Encounter (Signed)
 Call x1 to advise of appt on 9/24 - no answer. Text message sent to patient via 9/24 appt.

## 2024-08-17 ENCOUNTER — Ambulatory Visit (HOSPITAL_COMMUNITY)
Admission: RE | Admit: 2024-08-17 | Discharge: 2024-08-17 | Disposition: A | Discharge status: Home / Self Care | Source: Ambulatory Visit | Attending: Internal Medicine | Admitting: Internal Medicine

## 2024-08-17 VITALS — BP 112/58 | HR 78 | Ht 66.0 in | Wt 188.8 lb

## 2024-08-17 DIAGNOSIS — I4891 Unspecified atrial fibrillation: Secondary | ICD-10-CM | POA: Diagnosis not present

## 2024-08-17 DIAGNOSIS — D6869 Other thrombophilia: Secondary | ICD-10-CM

## 2024-08-17 DIAGNOSIS — I480 Paroxysmal atrial fibrillation: Secondary | ICD-10-CM

## 2024-08-17 MED ORDER — APIXABAN 5 MG PO TABS: 5.0000 mg | ORAL_TABLET | Freq: Two times a day (BID) | ORAL | 6 refills | Status: DC

## 2024-08-17 NOTE — Progress Notes (Signed)
 Primary Care Physician: Marvine Rush, MD Primary Cardiologist: Vishnu P Mallipeddi, MD Electrophysiologist: Eulas FORBES Furbish, MD     Referring Physician: Dr. Furbish Kristen Garner is a 73 y.o. female with a history of chronic systolic CHF with LVEF 30-35% in 2022 s/p CRT-D, CAD s/p RCA PCI x 2 in 2017, HTN, T2DM, CKD, atrial tachycardia, and atrial fibrillation who presents for consultation in the [facility]. Patient already taking amiodarone  noted by Dr. Furbish in February. Device check by Dr. Furbish on 9/20 notes Afib burden 1.94% with longest episode 5 hours and recommends anticoagulation / stop ASA. Patient has a CHADS2VASC score of 6.  On evaluation today, patient is currently in AV paced rhythm. Patient notes she does not drink coffee or alcohol. She does drink several Cokes daily. She does snore and notes to not sleep well overall.   Today, she denies symptoms of palpitations, chest pain, shortness of breath, orthopnea, PND, lower extremity edema, dizziness, presyncope, syncope, bleeding, or neurologic sequela. The patient is tolerating medications without difficulties and is otherwise without complaint today.    Atrial Fibrillation Risk Factors:  she does have symptoms or diagnosis of sleep apnea.   she has a BMI of Body mass index is 30.47 kg/m.[name] Filed Weights   08/17/24 1418  Weight: 85.6 kg    Current Outpatient Medications  Medication Sig Dispense Refill   albuterol  (PROVENTIL  HFA;VENTOLIN  HFA) 108 (90 Base) MCG/ACT inhaler Inhale 2 puffs into the lungs every 4 (four) hours as needed for wheezing or shortness of breath. 1 Inhaler 2   amiodarone  (PACERONE ) 100 MG tablet Take 1 tablet (100 mg total) by mouth daily. 90 tablet 3   aspirin  EC 81 MG EC tablet Take 1 tablet (81 mg total) by mouth daily.     clopidogrel  (PLAVIX ) 75 MG tablet Take 1 tablet (75 mg total) daily by mouth. 30 tablet 0   escitalopram  (LEXAPRO ) 10 MG tablet  Take 10 mg by mouth daily.     furosemide  (LASIX ) 40 MG tablet Take 1 tablet (40 mg total) daily by mouth. 30 tablet 11   levothyroxine  (SYNTHROID ) 75 MCG tablet Take 75 mcg by mouth daily.     losartan  (COZAAR ) 25 MG tablet Take 25 mg by mouth daily.     metoprolol  succinate (TOPROL -XL) 50 MG 24 hr tablet TAKE 1 TABLET(50 MG) BY MOUTH DAILY WITH OR IMMEDIATELY FOLLOWING A MEAL 90 tablet 2   nitroGLYCERIN  (NITROSTAT ) 0.4 MG SL tablet Place 0.4 mg under the tongue every 5 (five) minutes x 3 doses as needed for chest pain (if no relief after 2nd dose, proceed to ED or call 911).     rosuvastatin  (CRESTOR ) 20 MG tablet Take 20 mg by mouth daily.     No current facility-administered medications for this encounter.    Atrial Fibrillation Management history:  Previous antiarrhythmic drugs: none Previous cardioversions: none Previous ablations: none Anticoagulation history: none   ROS- All systems are reviewed and negative except as per the HPI above.  Physical Exam: BP (!) 112/58   Pulse 78   Ht 5' 6 (1.676 m)   Wt 85.6 kg   BMI 30.47 kg/m   GEN: Well nourished, well developed in no acute distress NECK: No JVD; No carotid bruits CARDIAC: Regular rate and rhythm, no murmurs, rubs, gallops RESPIRATORY:  Clear to auscultation without rales, wheezing or rhonchi  ABDOMEN: Soft, non-tender, non-distended EXTREMITIES:  No edema; No deformity  EKG today demonstrates  Vent. rate 78 BPM PR interval 162 ms QRS duration 136 ms QT/QTcB 478/544 ms P-R-T axes 79 138 -17 Atrial-sensed ventricular-paced rhythm with occasional Premature ventricular complexes Abnormal ECG When compared with ECG of 15-Jan-2024 14:08, Premature ventricular complexes are now Present  Echo 12/23/23 demonstrated  1. Left ventricular ejection fraction, by estimation, is approximately  55%. The left ventricle has normal function. The left ventricle  demonstrates regional wall motion abnormalities (see scoring   diagram/findings for description). There is mild  concentric left ventricular hypertrophy. Left ventricular diastolic  parameters are consistent with Grade I diastolic dysfunction (impaired  relaxation).   2. Right ventricular systolic function is normal. The right ventricular  size is normal. Tricuspid regurgitation signal is inadequate for assessing  PA pressure. Device wire present.   3. Left atrial size was mildly dilated.   4. The mitral valve is degenerative. There is significant thickening and  fusion of the mitral apparatus, particularly in the region attaching to  the posteromedial papillary muscle. No history of mitral repair reported.  Mild mitral valve regurgitation.   5. The aortic valve is tricuspid. There is mild calcification of the  aortic valve. Aortic valve regurgitation is not visualized. Mild aortic  valve stenosis. Aortic valve mean gradient measures 11.0 mmHg. Aortic  valve Vmax measures 2.20 m/s.  Dimentionless index 0.32.   6. The inferior vena cava is normal in size with greater than 50%  respiratory variability, suggesting right atrial pressure of 3 mmHg.    ASSESSMENT & PLAN CHA2DS2-VASc Score = 6  The patient's score is based upon: CHF History: 1 HTN History: 1 Diabetes History: 1 Stroke History: 0 Vascular Disease History: 1 Age Score: 1 Gender Score: 1       ASSESSMENT AND PLAN: Paroxysmal Atrial Fibrillation (ICD10:  I48.0) The patient's CHA2DS2-VASc score is 6, indicating a 9.7% annual risk of stroke.    Patient is currently in AV paced rhythm. Continue Toprol  50 mg daily. Education provided about Afib. Discussion about triggers for Afib and reduction in caffeine. After discussion, we will proceed with conservative observation at this time. Will reassess with subsequent device checks.   Secondary Hypercoagulable State (ICD10:  D68.69) The patient is at significant risk for stroke/thromboembolism based upon her CHA2DS2-VASc Score of 6.   Start Apixaban  (Eliquis ).  We discussed the reasoning behind anticoagulation in the setting of stroke prevention related to Afib. We discussed the benefits vs risks of anticoagulation. After discussion, patient would like to begin anticoagulation and understands the potential risks. Will begin Eliquis  5 mg BID and print order for patient to have CBC in 1 month closer to home. Stop ASA.   High risk medication monitoring (ICD10: J342684) Patient requires ongoing monitoring for anti-arrhythmic medication which has the potential to cause life threatening arrhythmias or AV block. Qtc stable. Continue amiodarone  100 mg daily.    Follow up 6 months Afib clinic.   Terra Pac, [facility]  [facility] [address] [place], [place] [address] [phone]

## 2024-08-17 NOTE — Patient Instructions (Addendum)
 Stop aspirin    Start Eliquis  5mg  twice a day   Get labwork in 1 month at any [facility]   [facility] - [address] - [address] [facility] -- scheduling will call once insurance authorization received.

## 2024-08-18 ENCOUNTER — Encounter (HOSPITAL_COMMUNITY): Payer: Self-pay | Admitting: *Deleted

## 2024-08-18 NOTE — Addendum Note (Signed)
 Encounter addended by: Janel Nancy SAUNDERS, RN on: 08/18/2024 9:15 AM  Actions taken: Order list changed

## 2024-08-19 ENCOUNTER — Encounter (HOSPITAL_COMMUNITY): Payer: Self-pay | Admitting: *Deleted

## 2024-08-19 ENCOUNTER — Other Ambulatory Visit (HOSPITAL_COMMUNITY): Payer: Self-pay

## 2024-08-23 ENCOUNTER — Telehealth (HOSPITAL_COMMUNITY): Payer: Self-pay | Admitting: Pharmacy Technician

## 2024-08-23 ENCOUNTER — Other Ambulatory Visit (HOSPITAL_COMMUNITY): Payer: Self-pay

## 2024-08-23 NOTE — Telephone Encounter (Signed)
-----   Message from Nurse Nancy DEL sent at 08/17/2024  3:04 PM EDT ----- Regarding: Eliquis  Do you know the co pay for Eliquis  ? Thanks -Karlene RN [facility]

## 2024-08-23 NOTE — Telephone Encounter (Signed)
 Apologies for not getting to this last week, but it looks like she got it on Thursday, so my test claim just says it's too soon to fill now. I can't see a price. Let us  know if it wasn't affordable. We can see about helping her get assistance if she qualifies financially.

## 2024-08-24 ENCOUNTER — Telehealth: Payer: Self-pay | Admitting: Pharmacy Technician

## 2024-08-24 ENCOUNTER — Other Ambulatory Visit (HOSPITAL_COMMUNITY): Payer: Self-pay

## 2024-08-24 NOTE — Telephone Encounter (Signed)
    Patient Advocate Encounter   The patient was approved for a [employer] grant that will help cover the cost of eliquis  Total amount awarded, 7500.  Effective: 07/25/24 - 07/24/25   APW:[id number] ERW:[id number] Group:[id number] PI:[id number] [employer] ID: [id number]   Pharmacy provided with approval and processing information. Patient informed via mychart

## 2024-08-26 ENCOUNTER — Other Ambulatory Visit (HOSPITAL_COMMUNITY): Payer: Self-pay

## 2024-08-26 MED ORDER — APIXABAN 5 MG PO TABS: 5.0000 mg | ORAL_TABLET | Freq: Two times a day (BID) | ORAL | Status: DC

## 2024-08-31 ENCOUNTER — Ambulatory Visit (INDEPENDENT_AMBULATORY_CARE_PROVIDER_SITE_OTHER): Payer: Self-pay

## 2024-08-31 VITALS — BP 162/77 | HR 81 | Ht 64.0 in | Wt 190.0 lb

## 2024-08-31 DIAGNOSIS — E039 Hypothyroidism, unspecified: Secondary | ICD-10-CM

## 2024-08-31 DIAGNOSIS — I5043 Acute on chronic combined systolic (congestive) and diastolic (congestive) heart failure: Secondary | ICD-10-CM

## 2024-08-31 DIAGNOSIS — C50912 Malignant neoplasm of unspecified site of left female breast: Secondary | ICD-10-CM

## 2024-08-31 DIAGNOSIS — E1151 Type 2 diabetes mellitus with diabetic peripheral angiopathy without gangrene: Secondary | ICD-10-CM

## 2024-08-31 DIAGNOSIS — E119 Type 2 diabetes mellitus without complications: Secondary | ICD-10-CM | POA: Diagnosis not present

## 2024-08-31 DIAGNOSIS — N1832 Chronic kidney disease, stage 3b: Secondary | ICD-10-CM

## 2024-08-31 MED ORDER — TIRZEPATIDE 2.5 MG/0.5ML ~~LOC~~ SOAJ: 2.5000 mg | SUBCUTANEOUS | 1 refills | Status: DC

## 2024-08-31 NOTE — Progress Notes (Signed)
 New Patient Office Visit  Subjective    Patient ID: Kristen Garner, female    DOB: November 05, 1951  Age: 73 y.o. MRN: [id number]  CC:  Chief Complaint  Patient presents with   Establish Care    Pt here to establish cafe    HPI Kristen Garner presents to establish care Discussed the use of AI scribe software for clinical note transcription with the patient, who gave verbal consent to proceed.  History of Present Illness   Kristen Garner is a 73 year old female with diabetes who presents with concerns about her blood sugar management.  Hyperglycemia and diabetes management - Diabetes mellitus with concerns about blood glucose management - Not currently taking any antidiabetic medications - Previously on daily insulin  injections with effective glycemic control, but has discontinued insulin  - No recent blood work specifically for diabetes; recent blood draw at cardiologist's office - Not checking blood glucose at home due to running out of test strips  Gastrointestinal symptoms and constipation - Sensation in the stomach described as feeling like a heartbeat - Difficulty with bowel movements and ongoing constipation - Has requested medication for constipation but has not received any - Has not tried over-the-counter options such as Miralax  - Drinks some water  during the day  Upper respiratory symptoms - Occasional coughing and sneezing attributed to high ragweed levels - Previously took a Z-Pak without improvement in symptoms  Edema and musculoskeletal symptoms - Swelling in the ankle - History of previous heel injury  Cardiovascular and renal history - History of pacemaker placement, heart failure, cardiomyopathy, and mitral valve repair with stent placement - Chronic kidney issues - Currently taking losartan  for blood pressure management       Outpatient Encounter Medications as of 08/31/2024  Medication Sig   albuterol  (PROVENTIL  HFA;VENTOLIN  HFA) 108 (90 Base)  MCG/ACT inhaler Inhale 2 puffs into the lungs every 4 (four) hours as needed for wheezing or shortness of breath.   amiodarone  (PACERONE ) 100 MG tablet Take 1 tablet (100 mg total) by mouth daily.   apixaban  (ELIQUIS ) 5 MG TABS tablet Take 1 tablet (5 mg total) by mouth 2 (two) times daily.   clopidogrel  (PLAVIX ) 75 MG tablet Take 75 mg by mouth daily.   escitalopram  (LEXAPRO ) 10 MG tablet Take 10 mg by mouth daily.   furosemide  (LASIX ) 40 MG tablet Take 1 tablet (40 mg total) daily by mouth.   levothyroxine  (SYNTHROID ) 75 MCG tablet Take 75 mcg by mouth daily.   losartan  (COZAAR ) 25 MG tablet Take 25 mg by mouth daily.   metoprolol  succinate (TOPROL -XL) 50 MG 24 hr tablet TAKE 1 TABLET(50 MG) BY MOUTH DAILY WITH OR IMMEDIATELY FOLLOWING A MEAL   nitroGLYCERIN  (NITROSTAT ) 0.4 MG SL tablet Place 0.4 mg under the tongue every 5 (five) minutes x 3 doses as needed for chest pain (if no relief after 2nd dose, proceed to ED or call 911).   rosuvastatin  (CRESTOR ) 20 MG tablet Take 20 mg by mouth daily.   [DISCONTINUED] tirzepatide (MOUNJARO) 2.5 MG/0.5ML Pen Inject 2.5 mg into the skin once a week.   No facility-administered encounter medications on file as of 08/31/2024.    Past Medical History:  Diagnosis Date   Abnormal chest CT    a. CTA 09/2016: 1.4 cm aortopulmonary window node seen, of uncertain significance   AKI (acute kidney injury) 10/26/2017   Anxiety    Arthritis    Breast cancer (HCC)    right   Breast cancer (HCC)  CAD (coronary artery disease)    a. 09/2016: cath showing 3-v disease with severe stenosis along RCA, CTO of PL branch, mild LAD stenosis, and moderate D1 stenosis. S/p DES x2 to RCA on 10/14/2016.   Chronic systolic CHF (congestive heart failure) (HCC)    a. 09/2016: Echo showing EF of 20-25% with diffuse HK   CKD (chronic kidney disease), stage III (HCC)    Depression    Diabetes mellitus    takes only the pill   Headache(784.0)    occiasional migraine    Hyperlipidemia    Hypertension    Hypokalemia    Noncompliance 01/04/2015   Osteopenia 01/04/2015   PAT (paroxysmal atrial tachycardia)    PVC's (premature ventricular contractions)    Shingles    Skin cancer 2015   right leg   UTI (urinary tract infection) 04/05/2013    Past Surgical History:  Procedure Laterality Date   ABDOMINAL HYSTERECTOMY     BREAST BIOPSY Right    biopsy then wide excision    CARDIAC CATHETERIZATION N/A 10/13/2016   Procedure: Left Heart Cath and Coronary Angiography;  Surgeon: Lonni JONETTA Cash, MD;  Location: [facility] INVASIVE CV LAB;  Service: Cardiovascular;  Laterality: N/A;   CARDIAC CATHETERIZATION N/A 10/14/2016   Procedure: Coronary Stent Intervention;  Surgeon: Lonni JONETTA Cash, MD;  Location: [facility] INVASIVE CV LAB;  Service: Cardiovascular;  Laterality: N/A;   CHOLECYSTECTOMY     CORONARY ANGIOPLASTY WITH STENT PLACEMENT  10/14/2016   ESOPHAGOGASTRODUODENOSCOPY N/A 10/31/2017   Procedure: ESOPHAGOGASTRODUODENOSCOPY (EGD);  Surgeon: Shaaron Lamar HERO, MD;  Location: [facility];  Service: Endoscopy;  Laterality: N/A;   PARTIAL MASTECTOMY WITH AXILLARY SENTINEL LYMPH NODE BIOPSY     SENTINEL LYMPH NODE BIOPSY     SKIN CANCER EXCISION Right 2015    Family History  Problem Relation Age of Onset   Diabetes Father    Stroke Maternal Grandmother     Social History   Socioeconomic History   Marital status: Widowed    Spouse name: Not on file   Number of children: Not on file   Years of education: Not on file   Highest education level: Not on file  Occupational History   Not on file  Tobacco Use   Smoking status: Former   Smokeless tobacco: Never  Vaping Use   Vaping status: Never Used  Substance and Sexual Activity   Alcohol use: No   Drug use: No   Sexual activity: Yes    Birth control/protection: Surgical  Other Topics Concern   Not on file  Social History Narrative   Not on file   Social Drivers of Health   Financial  Resource Strain: Not on file  Food Insecurity: Not on file  Transportation Needs: Not on file  Physical Activity: Not on file  Stress: Not on file  Social Connections: Not on file  Intimate Partner Violence: Not on file    ROS   Objective    BP (!) 162/77   Pulse 81   Ht 5' 4 (1.626 m)   Wt 190 lb (86.2 kg)   SpO2 90%   BMI 32.61 kg/m   Physical Exam Vitals and nursing note reviewed.  Constitutional:      Appearance: Normal appearance.  HENT:     Head: Normocephalic.     Right Ear: Tympanic membrane, ear canal and external ear normal.     Left Ear: Tympanic membrane, ear canal and external ear normal.     Nose: Nose  normal.     Mouth/Throat:     Mouth: Mucous membranes are moist.     Pharynx: Oropharynx is clear.  Eyes:     Extraocular Movements: Extraocular movements intact.     Pupils: Pupils are equal, round, and reactive to light.  Cardiovascular:     Rate and Rhythm: Normal rate and regular rhythm.  Pulmonary:     Effort: Pulmonary effort is normal.     Breath sounds: Normal breath sounds.  Musculoskeletal:     Cervical back: Normal range of motion and neck supple.  Skin:    General: Skin is warm and dry.  Neurological:     Mental Status: She is alert and oriented to person, place, and time.  Psychiatric:        Mood and Affect: Mood normal.        Thought Content: Thought content normal.         Assessment & Plan:   Problem List Items Addressed This Visit       Cardiovascular and Mediastinum   Acute on chronic combined systolic and diastolic CHF (congestive heart failure) (HCC)   Managed by cardiology, Dr. Mallipeddi .  Chronic systolic CHF with LVEF 30-35% in 2022 s/p CRT-D, CAD s/p RCA PCI x 2 in 2017         Endocrine   Diabetes (HCC) - Primary (Chronic)   Diabetes previously controlled with insulin , currently unmanaged due to lack of medication and monitoring. - Prescribe Mounjaro, monitor for constipation and nausea. - Verify  insurance coverage for Mounjaro; consider Ozempic if not covered. - Order A1c, thyroid , and kidney function tests. - Provide OneTouch test strips for glucose monitoring.      Relevant Orders   CMP14+EGFR (Completed)   HgB A1c (Completed)   Hypothyroidism   Recheck thyroid  labs today.  Currently prescribed levothyroxine  75 mcg.  Adjust dose if labs indicate      Relevant Orders   TSH + free T4 (Completed)     Genitourinary   Chronic kidney disease, stage 3b (HCC)   Currently managed by nephrology, Dr. Rachele.        Other   Infiltrating ductal carcinoma of breast (HCC)   Stage II (T2N0MX) infiltrating ductal carcinoma of right breast; ER/PR positive, HER2 negative.  S/P lumpectomy with SLN biopsy on 05/12/2012.   Continues to follow-up with oncology yearly.        She was Return in about 3 months (around 12/01/2024) for chronic follow-up with PCP.   Leita Longs, FNP

## 2024-09-01 ENCOUNTER — Telehealth: Payer: Self-pay

## 2024-09-01 ENCOUNTER — Ambulatory Visit (HOSPITAL_COMMUNITY): Payer: Self-pay | Admitting: Internal Medicine

## 2024-09-01 LAB — CBC
Hematocrit: 34.7 % (ref 34.0–46.6)
Hemoglobin: 11 g/dL — ABNORMAL LOW (ref 11.1–15.9)
MCH: 28.8 pg (ref 26.6–33.0)
MCHC: 31.7 g/dL (ref 31.5–35.7)
MCV: 91 fL (ref 79–97)
Platelets: 174 x10E3/uL (ref 150–450)
RBC: 3.82 x10E6/uL (ref 3.77–5.28)
RDW: 12.9 % (ref 11.7–15.4)
WBC: 5.7 x10E3/uL (ref 3.4–10.8)

## 2024-09-01 LAB — CMP14+EGFR
ALT: 14 IU/L (ref 0–32)
AST: 31 IU/L (ref 0–40)
Albumin: 3.9 g/dL (ref 3.8–4.8)
Alkaline Phosphatase: 158 IU/L — ABNORMAL HIGH (ref 49–135)
BUN/Creatinine Ratio: 15 (ref 12–28)
BUN: 20 mg/dL (ref 8–27)
Bilirubin Total: 0.3 mg/dL (ref 0.0–1.2)
CO2: 21 mmol/L (ref 20–29)
Calcium: 9 mg/dL (ref 8.7–10.3)
Chloride: 99 mmol/L (ref 96–106)
Creatinine, Ser: 1.37 mg/dL — ABNORMAL HIGH (ref 0.57–1.00)
Globulin, Total: 2.8 g/dL (ref 1.5–4.5)
Glucose: 326 mg/dL — ABNORMAL HIGH (ref 70–99)
Potassium: 4.4 mmol/L (ref 3.5–5.2)
Sodium: 135 mmol/L (ref 134–144)
Total Protein: 6.7 g/dL (ref 6.0–8.5)
eGFR: 41 mL/min/1.73 — ABNORMAL LOW (ref >59)

## 2024-09-01 LAB — HEMOGLOBIN A1C
Est. average glucose Bld gHb Est-mCnc: 338 mg/dL
Hgb A1c MFr Bld: 13.4 % — ABNORMAL HIGH (ref 4.8–5.6)

## 2024-09-01 LAB — TSH+FREE T4
Free T4: 1.15 ng/dL (ref 0.82–1.77)
TSH: 1.17 u[IU]/mL (ref 0.450–4.500)

## 2024-09-01 NOTE — Telephone Encounter (Signed)
 Copied from CRM [id number]. Topic: Clinical - Medication Question >> Sep 01, 2024  4:46 PM Delon HERO wrote: Reason for CRM: Patient is calling to report that she is unable to afford tirzepatide (MOUNJARO) 2.5 MG/0.5ML Pen [[id number]]. Reporting that the medication is $510.48. Requesting another medication that is cheaper or a PA.

## 2024-09-02 ENCOUNTER — Other Ambulatory Visit: Payer: Self-pay

## 2024-09-02 DIAGNOSIS — E119 Type 2 diabetes mellitus without complications: Secondary | ICD-10-CM

## 2024-09-02 MED ORDER — OZEMPIC (0.25 OR 0.5 MG/DOSE) 2 MG/3ML ~~LOC~~ SOPN: 0.2500 mg | PEN_INJECTOR | SUBCUTANEOUS | 1 refills | Status: DC

## 2024-09-04 DIAGNOSIS — N1832 Chronic kidney disease, stage 3b: Secondary | ICD-10-CM | POA: Insufficient documentation

## 2024-09-04 NOTE — Assessment & Plan Note (Signed)
 Diabetes previously controlled with insulin , currently unmanaged due to lack of medication and monitoring. - Prescribe Mounjaro, monitor for constipation and nausea. - Verify insurance coverage for Mounjaro; consider Ozempic if not covered. - Order A1c, thyroid , and kidney function tests. - Provide OneTouch test strips for glucose monitoring.

## 2024-09-04 NOTE — Assessment & Plan Note (Addendum)
 Stage II (T2N0MX) infiltrating ductal carcinoma of right breast; ER/PR positive, HER2 negative.  S/P lumpectomy with SLN biopsy on 05/12/2012.   Continues to follow-up with oncology yearly.

## 2024-09-04 NOTE — Assessment & Plan Note (Signed)
 Currently managed by nephrology, Dr. Rachele.

## 2024-09-04 NOTE — Assessment & Plan Note (Signed)
 Recheck thyroid  labs today.  Currently prescribed levothyroxine  75 mcg.  Adjust dose if labs indicate

## 2024-09-04 NOTE — Assessment & Plan Note (Signed)
 Managed by cardiology, Dr. Mallipeddi .  Chronic systolic CHF with LVEF 30-35% in 2022 s/p CRT-D, CAD s/p RCA PCI x 2 in 2017

## 2024-09-06 ENCOUNTER — Telehealth: Payer: Self-pay

## 2024-09-06 NOTE — Telephone Encounter (Signed)
 Copied from CRM [id number]. Topic: Clinical - Prescription Issue >> Sep 06, 2024  9:41 AM Delon DASEN wrote: Reason for CRM: Patient unable to afford her insulin , need assistance, please call [phone]

## 2024-09-08 ENCOUNTER — Other Ambulatory Visit (HOSPITAL_COMMUNITY): Payer: Self-pay

## 2024-09-09 ENCOUNTER — Other Ambulatory Visit (HOSPITAL_COMMUNITY): Payer: Self-pay

## 2024-09-09 NOTE — Telephone Encounter (Signed)
 Patient is calling to follow up on the below request - reporting that she unable to afford the medication Semaglutide,0.25 or 0.5MG /DOS, (OZEMPIC, 0.25 OR 0.5 MG/DOSE,) 2 MG/3ML SOPN [[id number]]  at $100

## 2024-09-12 ENCOUNTER — Other Ambulatory Visit: Payer: Self-pay

## 2024-09-12 DIAGNOSIS — E1151 Type 2 diabetes mellitus with diabetic peripheral angiopathy without gangrene: Secondary | ICD-10-CM

## 2024-09-12 MED ORDER — LANTUS SOLOSTAR 100 UNIT/ML ~~LOC~~ SOPN: 10.0000 [IU] | PEN_INJECTOR | Freq: Every day | SUBCUTANEOUS | 2 refills | Status: AC

## 2024-09-12 NOTE — Telephone Encounter (Signed)
 The patient called back checking on the status of the medication she could not afford even with insurance. I spoke with Belvie the office Administrator and he will pass the message on to the provider and the providers nurse. He also switched the name of her provider in Epic as it had Dr Marvine name listed and now it is the correct provider Leita Longs. Please assist patient further.

## 2024-09-13 NOTE — Telephone Encounter (Signed)
 I sent in a prescription for Lantus  for her diabetes.  This is a once daily insulin , which she will take at bedtime.  Recommend she send in her fasting am blood sugars to us  in a week so that we can adjust dose if needed.

## 2024-09-13 NOTE — Telephone Encounter (Signed)
 Mychart message sent to patient.

## 2024-09-19 NOTE — Telephone Encounter (Signed)
 Ozempic will be removed from [employer] Patient Assistance Program for 2026, is anything still needed from med assistance team? Thank you.

## 2024-09-28 ENCOUNTER — Other Ambulatory Visit (HOSPITAL_COMMUNITY): Payer: Self-pay | Admitting: *Deleted

## 2024-09-28 MED ORDER — APIXABAN 5 MG PO TABS: 5.0000 mg | ORAL_TABLET | Freq: Two times a day (BID) | ORAL | 3 refills | Status: AC

## 2024-09-28 NOTE — Telephone Encounter (Signed)
 Called [facility] and asked them to run the rx on insurance and the grant  Copay is now free

## 2024-10-10 ENCOUNTER — Telehealth: Payer: Self-pay

## 2024-10-10 NOTE — Progress Notes (Signed)
 Kristen Garner                                          MRN: [id number]   10/10/2024   The [facility] Quality Team Specialist reviewed this patient medical record for the purposes of chart review for care gap closure. The following were reviewed: chart review for care gap closure-glycemic status assessment.    [facility] Quality Team

## 2024-10-10 NOTE — Telephone Encounter (Unsigned)
 Copied from CRM #[id number]. Topic: Clinical - Prescription Issue >> Oct 10, 2024  4:13 PM Kristen Garner wrote: Reason for CRM: Patient states she went to the pharmacy to pick up the script for [facility] and was told it would be $1000 out of pocket, patient needs to know what to do, and is there any help that will hep pay for her diabetic meds

## 2024-10-11 ENCOUNTER — Telehealth: Payer: Self-pay

## 2024-10-11 NOTE — Telephone Encounter (Signed)
 Per previous encounter, patient is not suppose to be taking monjuaro, it was changed to ozempic

## 2024-10-11 NOTE — Telephone Encounter (Signed)
 Copied from CRM [id number]. Topic: General - Other >> Oct 11, 2024 12:35 PM Kevelyn M wrote: Reason for CRM: Patient calling in to let Leita know that she's received her diabetic medication. She said it starts with an 'M', she got it for $47.

## 2024-11-11 ENCOUNTER — Ambulatory Visit

## 2024-11-11 DIAGNOSIS — I255 Ischemic cardiomyopathy: Secondary | ICD-10-CM

## 2024-11-11 LAB — CUP PACEART REMOTE DEVICE CHECK
Battery Remaining Longevity: 37 mo
Battery Voltage: 2.95 V
Brady Statistic RV Percent Paced: 72.97 %
Date Time Interrogation Session: 20251218134305
HighPow Impedance: 64 Ohm
Implantable Lead Connection Status: 753985
Implantable Lead Connection Status: 753985
Implantable Lead Connection Status: 753985
Implantable Lead Implant Date: 20201223
Implantable Lead Implant Date: 20201223
Implantable Lead Implant Date: 20201223
Implantable Lead Location: 753858
Implantable Lead Location: 753859
Implantable Lead Location: 753860
Implantable Lead Model: 4598
Implantable Lead Model: 5076
Implantable Pulse Generator Implant Date: 20201223
Lead Channel Impedance Value: 1026 Ohm
Lead Channel Impedance Value: 1159 Ohm
Lead Channel Impedance Value: 1216 Ohm
Lead Channel Impedance Value: 361 Ohm
Lead Channel Impedance Value: 418 Ohm
Lead Channel Impedance Value: 589 Ohm
Lead Channel Impedance Value: 589 Ohm
Lead Channel Impedance Value: 627 Ohm
Lead Channel Impedance Value: 665 Ohm
Lead Channel Impedance Value: 665 Ohm
Lead Channel Impedance Value: 836 Ohm
Lead Channel Impedance Value: 931 Ohm
Lead Channel Impedance Value: 969 Ohm
Lead Channel Pacing Threshold Amplitude: 0.375 V
Lead Channel Pacing Threshold Amplitude: 0.5 V
Lead Channel Pacing Threshold Amplitude: 2 V
Lead Channel Pacing Threshold Pulse Width: 0.4 ms
Lead Channel Pacing Threshold Pulse Width: 0.4 ms
Lead Channel Pacing Threshold Pulse Width: 1 ms
Lead Channel Sensing Intrinsic Amplitude: 1.9 mV
Lead Channel Sensing Intrinsic Amplitude: 6.6 mV
Lead Channel Setting Pacing Amplitude: 1.5 V
Lead Channel Setting Pacing Amplitude: 2 V
Lead Channel Setting Pacing Amplitude: 2 V
Lead Channel Setting Pacing Pulse Width: 0.4 ms
Lead Channel Setting Pacing Pulse Width: 1 ms
Lead Channel Setting Sensing Sensitivity: 0.3 mV
Zone Setting Status: 755011
Zone Setting Status: 755011

## 2024-11-11 NOTE — Progress Notes (Signed)
 Remote ICD Transmission

## 2024-11-18 ENCOUNTER — Ambulatory Visit

## 2024-11-23 ENCOUNTER — Ambulatory Visit: Payer: Self-pay | Admitting: Cardiovascular Disease

## 2024-11-27 ENCOUNTER — Ambulatory Visit: Payer: Self-pay

## 2024-12-02 ENCOUNTER — Ambulatory Visit: Attending: Internal Medicine | Admitting: Internal Medicine

## 2024-12-02 NOTE — Progress Notes (Signed)
 Erroneous encounter - please disregard.

## 2024-12-10 ENCOUNTER — Other Ambulatory Visit: Payer: Self-pay

## 2024-12-12 ENCOUNTER — Ambulatory Visit

## 2024-12-12 VITALS — BP 152/72 | HR 76 | Temp 97.3°F | Ht 64.0 in | Wt 183.6 lb

## 2024-12-12 DIAGNOSIS — R062 Wheezing: Secondary | ICD-10-CM

## 2024-12-12 DIAGNOSIS — J069 Acute upper respiratory infection, unspecified: Secondary | ICD-10-CM | POA: Diagnosis not present

## 2024-12-12 DIAGNOSIS — I10 Essential (primary) hypertension: Secondary | ICD-10-CM

## 2024-12-12 DIAGNOSIS — J029 Acute pharyngitis, unspecified: Secondary | ICD-10-CM | POA: Diagnosis not present

## 2024-12-12 LAB — POCT RAPID STREP A (OFFICE): Rapid Strep A Screen: NEGATIVE

## 2024-12-12 MED ORDER — BENZONATATE 100 MG PO CAPS: 100.0000 mg | ORAL_CAPSULE | Freq: Two times a day (BID) | ORAL | 0 refills | Status: AC | PRN

## 2024-12-12 MED ORDER — BUDESONIDE-FORMOTEROL FUMARATE 80-4.5 MCG/ACT IN AERO: 2.0000 | INHALATION_SPRAY | Freq: Two times a day (BID) | RESPIRATORY_TRACT | 2 refills | Status: AC

## 2024-12-12 MED ORDER — FLUTICASONE PROPIONATE 50 MCG/ACT NA SUSP: 2.0000 | Freq: Every day | NASAL | 6 refills | Status: AC

## 2024-12-12 NOTE — Progress Notes (Signed)
 "  Acute Office Visit  Subjective:     Patient ID: Kristen Garner, female    DOB: 02/28/1951, 74 y.o.   MRN: [id number]  Chief Complaint  Patient presents with   Cough   Headache   Sore Throat    Just irritated   Chills   Nasal Congestion   Eye Pain    right   sneezing   Fever    One night   Nausea    vommiting   Diarrhea   Discussed the use of AI scribe software for clinical note transcription with the patient, who gave verbal consent to proceed.  History of Present Illness Kristen Garner is a 74 year old female with asthma who presents with symptoms of a viral infection.  Symptoms began on Friday with chills, rhinorrhea, cough with clear sputum, sneezing, headache, and fever. She experienced a fever one night but did not measure her temperature. She feels cold and has been sitting by the fireplace to keep warm.  She has gastrointestinal symptoms including nausea, vomiting, and diarrhea. Only had one episode of this on Friday, and symptoms have resolved since then. She is able to keep down a little food and drink but is afraid to eat much due to fear of frequent bathroom use. She feels very thirsty.  She has a history of asthma and uses an albuterol  inhaler two to three times a day, especially at night, due to wheezing and shortness of breath. She does not use a maintenance inhaler.  She reports ear pain, particularly in one ear, with a sensation of congestion and retraction, feeling like she is 'in a tunnel'. She has used Flonase  in the past but not recently.  She has a sore throat, describing it as feeling like 'swallowing glass'. Her ribs hurt from coughing extensively.  She has a known allergy to penicillin and has a pacemaker. She inquires about the safety of taking Nyquil with her pacemaker.    Review of Systems  Constitutional:  Positive for activity change, appetite change, chills, fatigue and fever.  HENT:  Positive for congestion, rhinorrhea, sneezing and  sore throat. Negative for ear discharge, ear pain, sinus pressure and sinus pain.   Eyes:  Positive for pain. Negative for visual disturbance.  Respiratory:  Positive for cough. Negative for chest tightness, shortness of breath and wheezing.   Cardiovascular:  Negative for chest pain, palpitations and leg swelling.  Gastrointestinal:  Positive for diarrhea, nausea and vomiting.  Genitourinary:  Negative for difficulty urinating.  Neurological:  Positive for headaches. Negative for dizziness and light-headedness.  Psychiatric/Behavioral:  Negative for sleep disturbance.        Objective:    Today's Vitals   12/12/24 1543 12/12/24 1603  BP: (!) 154/59 (!) 152/72  Pulse: 76   Temp: (!) 97.3 F (36.3 C)   SpO2: 99%   Weight: 183 lb 9.6 oz (83.3 kg)   Height: 5' 4 (1.626 m)     Body mass index is 31.51 kg/m.   Physical Exam Vitals and nursing note reviewed.  Constitutional:      General: She is not in acute distress.    Appearance: Normal appearance. She is not ill-appearing.  HENT:     Right Ear: Tympanic membrane is retracted. Tympanic membrane is not erythematous.     Left Ear: Tympanic membrane is retracted. Tympanic membrane is not erythematous.     Nose: Congestion and rhinorrhea present.     Right Sinus: No maxillary sinus tenderness or  frontal sinus tenderness.     Left Sinus: No maxillary sinus tenderness or frontal sinus tenderness.     Mouth/Throat:     Mouth: Mucous membranes are moist.     Pharynx: Posterior oropharyngeal erythema present. No oropharyngeal exudate.  Cardiovascular:     Rate and Rhythm: Normal rate and regular rhythm.     Heart sounds: Normal heart sounds, S1 normal and S2 normal. No murmur heard. Pulmonary:     Effort: Pulmonary effort is normal. No respiratory distress.     Breath sounds: Normal breath sounds. No wheezing.  Musculoskeletal:     Right lower leg: No edema.     Left lower leg: No edema.  Lymphadenopathy:     Cervical: No  cervical adenopathy.  Neurological:     Mental Status: She is alert.  Psychiatric:        Mood and Affect: Mood normal.        Behavior: Behavior normal.        Thought Content: Thought content normal.        Judgment: Judgment normal.   Rapid strep test negative.   No results found for any visits on 12/12/24.    Assessment & Plan:  1. Viral URI with cough - Symptoms suggest viral etiology, past Tamiflu window. Tested for strep throat due to sore throat and throat papules. Strep test negative.  -Encouraged to push fluids. Patient looks well hydrated on exam.  - Prescribed Tessalon  Perles for cough. - Recommended DayQuil for symptom relief. -Instructed patient on the use of Flonase . -Advised patient that if she experiences double worsening or fever to let us  know.   -ED precautions discussed. - benzonatate  (TESSALON ) 100 MG capsule; Take 1 capsule (100 mg total) by mouth 2 (two) times daily as needed for cough.  Dispense: 20 capsule; Refill: 0 - fluticasone  (FLONASE ) 50 MCG/ACT nasal spray; Place 2 sprays into both nostrils daily.  Dispense: 16 g; Refill: 6  2. Wheezing (Primary) - Intermittent wheezing, using albuterol  inhaler 2-3 times daily. Asthma present, no COPD history. No wheezing on exam. Symbicort  recommended for inflammation. - Prescribed Symbicort  inhaler for use as needed to address wheezing and inflammation. - budesonide -formoterol  (SYMBICORT ) 80-4.5 MCG/ACT inhaler; Inhale 2 puffs into the lungs 2 (two) times daily.  Dispense: 1 each; Refill: 2  3. Essential hypertension Patient hypertensive on exam today.  Possible etiology could be because the patient is sick today.  Patient taking blood pressure medications as prescribed. - Wrote patient a prescription for blood pressure monitor and advised to take once daily and send blood pressure log to PCP.  Patient verbalized understanding.  Return if symptoms worsen or fail to improve.   Damien KATHEE Pringle, FNP  Note:  This  document was prepared using Dragon voice recognition software and may include unintentional dictation errors.   "

## 2024-12-12 NOTE — Addendum Note (Signed)
 Addended by: MINTA BOUCHARD on: 12/12/2024 04:31 PM   Modules accepted: Orders

## 2024-12-22 ENCOUNTER — Ambulatory Visit

## 2025-02-10 ENCOUNTER — Encounter

## 2025-02-13 ENCOUNTER — Ambulatory Visit (HOSPITAL_COMMUNITY): Admitting: Internal Medicine

## 2025-02-15 ENCOUNTER — Ambulatory Visit: Payer: Self-pay | Admitting: Nurse Practitioner

## 2025-05-12 ENCOUNTER — Encounter

## 2025-08-11 ENCOUNTER — Encounter

## 2025-11-10 ENCOUNTER — Encounter
